# Patient Record
Sex: Female | Born: 1965 | Race: Black or African American | Hispanic: No | Marital: Single | Smoking: Former smoker
Health system: Southern US, Community
[De-identification: ages and names within clinical notes are randomized; demographics above are authoritative.]

## PROBLEM LIST (undated history)

## (undated) DIAGNOSIS — J45909 Unspecified asthma, uncomplicated: Secondary | ICD-10-CM

## (undated) DIAGNOSIS — E119 Type 2 diabetes mellitus without complications: Secondary | ICD-10-CM

## (undated) DIAGNOSIS — G4730 Sleep apnea, unspecified: Secondary | ICD-10-CM

## (undated) DIAGNOSIS — M1990 Unspecified osteoarthritis, unspecified site: Secondary | ICD-10-CM

## (undated) HISTORY — DX: Type 2 diabetes mellitus without complications: E11.9

## (undated) HISTORY — PX: ABDOMINAL HYSTERECTOMY: SHX81

---

## 1995-11-28 HISTORY — PX: HYSTERECTOMY ABDOMINAL WITH SALPINGECTOMY: SHX6725

## 1998-07-02 ENCOUNTER — Inpatient Hospital Stay (HOSPITAL_COMMUNITY): Admission: AD | Admit: 1998-07-02 | Discharge: 1998-07-02 | Payer: Self-pay | Admitting: Obstetrics and Gynecology

## 1998-11-12 ENCOUNTER — Other Ambulatory Visit: Admission: RE | Admit: 1998-11-12 | Discharge: 1998-11-12 | Payer: Self-pay | Admitting: Obstetrics and Gynecology

## 1999-05-03 ENCOUNTER — Other Ambulatory Visit: Admission: RE | Admit: 1999-05-03 | Discharge: 1999-05-03 | Payer: Self-pay | Admitting: Obstetrics and Gynecology

## 2001-03-25 ENCOUNTER — Ambulatory Visit (HOSPITAL_COMMUNITY): Admission: RE | Admit: 2001-03-25 | Discharge: 2001-03-25 | Payer: Self-pay | Admitting: Family Medicine

## 2001-03-25 ENCOUNTER — Encounter: Payer: Self-pay | Admitting: Family Medicine

## 2002-01-25 ENCOUNTER — Encounter: Payer: Self-pay | Admitting: Family Medicine

## 2002-01-25 ENCOUNTER — Ambulatory Visit (HOSPITAL_COMMUNITY): Admission: RE | Admit: 2002-01-25 | Discharge: 2002-01-25 | Payer: Self-pay | Admitting: Family Medicine

## 2002-07-14 ENCOUNTER — Ambulatory Visit (HOSPITAL_COMMUNITY): Admission: RE | Admit: 2002-07-14 | Discharge: 2002-07-14 | Payer: Self-pay | Admitting: Family Medicine

## 2002-07-14 ENCOUNTER — Encounter: Payer: Self-pay | Admitting: Family Medicine

## 2005-08-29 ENCOUNTER — Ambulatory Visit: Payer: Self-pay | Admitting: General Practice

## 2006-09-12 ENCOUNTER — Ambulatory Visit: Payer: Self-pay | Admitting: General Practice

## 2007-05-24 ENCOUNTER — Emergency Department (HOSPITAL_COMMUNITY): Admission: EM | Admit: 2007-05-24 | Discharge: 2007-05-25 | Payer: Self-pay | Admitting: Emergency Medicine

## 2007-05-28 ENCOUNTER — Encounter (HOSPITAL_COMMUNITY): Admission: RE | Admit: 2007-05-28 | Discharge: 2007-08-26 | Payer: Self-pay | Admitting: Family Medicine

## 2007-09-17 ENCOUNTER — Ambulatory Visit: Payer: Self-pay | Admitting: General Practice

## 2008-09-24 ENCOUNTER — Ambulatory Visit (HOSPITAL_COMMUNITY): Admission: RE | Admit: 2008-09-24 | Discharge: 2008-09-24 | Payer: Self-pay | Admitting: Family Medicine

## 2011-04-10 ENCOUNTER — Ambulatory Visit: Payer: Self-pay | Admitting: Emergency Medicine

## 2011-05-17 ENCOUNTER — Other Ambulatory Visit: Payer: Self-pay | Admitting: Obstetrics and Gynecology

## 2011-05-17 DIAGNOSIS — R110 Nausea: Secondary | ICD-10-CM

## 2011-05-19 ENCOUNTER — Ambulatory Visit
Admission: RE | Admit: 2011-05-19 | Discharge: 2011-05-19 | Disposition: A | Discharge status: Home / Self Care | Payer: BC Managed Care – PPO | Source: Ambulatory Visit | Attending: Obstetrics and Gynecology | Admitting: Obstetrics and Gynecology

## 2011-05-19 DIAGNOSIS — R110 Nausea: Secondary | ICD-10-CM

## 2011-05-19 MED ORDER — IOHEXOL 300 MG/ML  SOLN
100.0000 mL | Freq: Once | INTRAMUSCULAR | Status: AC | PRN
  Administered 2011-05-19: 125 mL via INTRAVENOUS

## 2011-06-13 ENCOUNTER — Other Ambulatory Visit (HOSPITAL_COMMUNITY): Payer: Self-pay | Admitting: Gastroenterology

## 2011-06-13 DIAGNOSIS — R110 Nausea: Secondary | ICD-10-CM

## 2011-06-29 ENCOUNTER — Encounter (HOSPITAL_COMMUNITY)
Admission: RE | Admit: 2011-06-29 | Discharge: 2011-06-29 | Disposition: A | Discharge status: Home / Self Care | Payer: BC Managed Care – PPO | Source: Ambulatory Visit | Attending: Gastroenterology | Admitting: Gastroenterology

## 2011-06-29 DIAGNOSIS — R110 Nausea: Secondary | ICD-10-CM | POA: Insufficient documentation

## 2011-06-29 DIAGNOSIS — R109 Unspecified abdominal pain: Secondary | ICD-10-CM | POA: Insufficient documentation

## 2011-06-29 MED ORDER — SINCALIDE 5 MCG IJ SOLR: 0.0200 ug/kg | Freq: Once | INTRAMUSCULAR | Status: DC

## 2011-06-29 MED ORDER — TECHNETIUM TC 99M MEBROFENIN IV KIT
5.2000 | PACK | Freq: Once | INTRAVENOUS | Status: AC | PRN
  Administered 2011-06-29: 5.2 via INTRAVENOUS

## 2011-07-06 ENCOUNTER — Emergency Department (HOSPITAL_COMMUNITY)
Admission: EM | Admit: 2011-07-06 | Discharge: 2011-07-06 | Disposition: A | Discharge status: Home / Self Care | Payer: BC Managed Care – PPO | Attending: Emergency Medicine | Admitting: Emergency Medicine

## 2011-07-06 DIAGNOSIS — L298 Other pruritus: Secondary | ICD-10-CM | POA: Insufficient documentation

## 2011-07-06 DIAGNOSIS — T373X5A Adverse effect of other antiprotozoal drugs, initial encounter: Secondary | ICD-10-CM | POA: Insufficient documentation

## 2011-07-06 DIAGNOSIS — R221 Localized swelling, mass and lump, neck: Secondary | ICD-10-CM | POA: Insufficient documentation

## 2011-07-06 DIAGNOSIS — L2989 Other pruritus: Secondary | ICD-10-CM | POA: Insufficient documentation

## 2011-07-06 DIAGNOSIS — R21 Rash and other nonspecific skin eruption: Secondary | ICD-10-CM | POA: Insufficient documentation

## 2011-07-06 DIAGNOSIS — L500 Allergic urticaria: Secondary | ICD-10-CM | POA: Insufficient documentation

## 2011-07-06 DIAGNOSIS — R220 Localized swelling, mass and lump, head: Secondary | ICD-10-CM | POA: Insufficient documentation

## 2012-09-18 ENCOUNTER — Ambulatory Visit: Payer: Self-pay

## 2012-09-21 ENCOUNTER — Ambulatory Visit (INDEPENDENT_AMBULATORY_CARE_PROVIDER_SITE_OTHER): Payer: BC Managed Care – PPO | Admitting: Emergency Medicine

## 2012-09-21 VITALS — BP 121/77 | HR 102 | Temp 98.8°F | Resp 16 | Ht 62.0 in | Wt 206.0 lb

## 2012-09-21 DIAGNOSIS — M5430 Sciatica, unspecified side: Secondary | ICD-10-CM

## 2012-09-21 MED ORDER — TRAMADOL HCL 50 MG PO TABS: 50.0000 mg | ORAL_TABLET | Freq: Four times a day (QID) | ORAL | Status: DC | PRN

## 2012-09-21 MED ORDER — NAPROXEN SODIUM 550 MG PO TABS: 550.0000 mg | ORAL_TABLET | Freq: Two times a day (BID) | ORAL | Status: DC

## 2012-09-21 NOTE — Progress Notes (Signed)
[facility] [address], [place] [address] [phone]  Date:  09/21/2012   Name:  Stephanie Chandler   DOB:  [birth year]   MRN:  [id number]  PCP:  No primary provider on file.    Chief Complaint: Knee Pain   History of Present Illness:  Stephanie Chandler is a 46 y.o. very pleasant female patient who presents with the following:  Year or two history of intermittent pain in knee associated with radiation of pain from knee into ankle.  Numbness in lower leg at time and "feels off balance" due to weakness in her knee.  Chronic low back pain not relieved by diclofenac.  No history of injury.  Pain is intermittent yesterday so severe she could barely walk due to pain and today is nearly gone.  Worse with ambulation.  Not as bad when she stands  There is no problem list on file for this patient.   No past medical history on file.  No past surgical history on file.  History  Substance Use Topics  . Smoking status: Never Smoker   . Smokeless tobacco: Not on file  . Alcohol Use: Not on file    No family history on file.  Allergies  Allergen Reactions  . Flagyl (Metronidazole) Hives    Medication list has been reviewed and updated.  No current outpatient prescriptions on file prior to visit.    Review of Systems:  As per HPI, otherwise negative.    Physical Examination: Filed Vitals:   09/21/12 1744  BP: 121/77  Pulse: 102  Temp: 98.8 F (37.1 C)  Resp: 16   Filed Vitals:   09/21/12 1744  Height: 5\' 2"  (1.575 m)  Weight: 206 lb (93.441 kg)   Body mass index is 37.68 kg/(m^2). Ideal Body Weight: Weight in (lb) to have BMI = 25: 136.4    GEN: obese, NAD, Non-toxic, Alert & Oriented x 3 HEENT: Atraumatic, Normocephalic.  Ears and Nose: No external deformity. Back:  Not tender  EXTR: No clubbing/cyanosis/edema NEURO: Normal gait. SLR positive on right.  Strength and neuro intact PSYCH: Normally interactive. Conversant. Not depressed or  anxious appearing.  Calm demeanor.    Assessment and Plan: Sciatic neuritis Anaprox Ultram Follow up in one month unless not improved  Carmelina Dane, MD

## 2012-10-03 NOTE — Progress Notes (Signed)
Reviewed and agree.

## 2012-11-13 ENCOUNTER — Encounter: Payer: Self-pay | Admitting: Emergency Medicine

## 2012-11-13 ENCOUNTER — Ambulatory Visit (INDEPENDENT_AMBULATORY_CARE_PROVIDER_SITE_OTHER): Payer: BC Managed Care – PPO | Admitting: Emergency Medicine

## 2012-11-13 VITALS — BP 130/80 | HR 79 | Temp 98.3°F | Resp 16 | Ht 62.0 in | Wt 206.0 lb

## 2012-11-13 DIAGNOSIS — M5430 Sciatica, unspecified side: Secondary | ICD-10-CM | POA: Insufficient documentation

## 2012-11-13 MED ORDER — NAPROXEN SODIUM 550 MG PO TABS: 550.0000 mg | ORAL_TABLET | Freq: Two times a day (BID) | ORAL | Status: DC

## 2012-11-13 MED ORDER — CYCLOBENZAPRINE HCL 10 MG PO TABS: 10.0000 mg | ORAL_TABLET | Freq: Three times a day (TID) | ORAL | Status: DC | PRN

## 2012-11-13 MED ORDER — HYDROCODONE-ACETAMINOPHEN 5-325 MG PO TABS: 1.0000 | ORAL_TABLET | ORAL | Status: DC | PRN

## 2012-11-13 NOTE — Progress Notes (Signed)
[facility] [address] [place] [address] [phone]  Date:  11/13/2012   Name:  Stephanie Chandler   DOB:  December 19, 1965   MRN:  [id number]  PCP:  No primary provider on file.    Chief Complaint: Back Pain and Knee Pain    History of Present Illness:  Stephanie Chandler is a 46 y.o. very pleasant female patient who presents with the following:  Pain in her left knee similar to the pain she experienced in her right knee in 10.26.2013.  Has no history of injury.  Walks and stands a lot at work and is working a lot of overtime now in anticipation of the holidays.  Has some back pain that is new. No pain in right knee.  Says her left knee is not swelling or red.  Some pain radiation from time to time in the calf posteriorly when the pain worsens.  There is no problem list on file for this patient.   No past medical history on file.  No past surgical history on file.  History  Substance Use Topics  . Smoking status: Never Smoker   . Smokeless tobacco: Not on file  . Alcohol Use: Not on file    No family history on file.  Allergies  Allergen Reactions  . Flagyl (Metronidazole) Hives    Medication list has been reviewed and updated.  Current Outpatient Prescriptions on File Prior to Visit  Medication Sig Dispense Refill  . traMADol (ULTRAM) 50 MG tablet Take 1-2 tablets (50-100 mg total) by mouth every 6 (six) hours as needed for pain.  50 tablet  0  . diclofenac (VOLTAREN) 75 MG EC tablet Take 75 mg by mouth as needed.      . naproxen sodium (ANAPROX DS) 550 MG tablet Take 1 tablet (550 mg total) by mouth 2 (two) times daily with a meal.  40 tablet  0    Review of Systems:  As per HPI, otherwise negative.    Physical Examination: Filed Vitals:   11/13/12 1617  BP: 130/80  Pulse: 79  Temp: 98.3 F (36.8 C)  Resp: 16   Filed Vitals:   11/13/12 1617  Height: 5\' 2"  (1.575 m)  Weight: 206 lb (93.441 kg)   Body mass index is 37.68  kg/(m^2). Ideal Body Weight: Weight in (lb) to have BMI = 25: 136.4    GEN: WDWN, NAD, Non-toxic, Alert & Oriented x 3 HEENT: Atraumatic, Normocephalic.  Ears and Nose: No external deformity. EXTR: No clubbing/cyanosis/edema NEURO: Normal gait.  PSYCH: Normally interactive. Conversant. Not depressed or anxious appearing.  Calm demeanor.  BACK:  No tenderness Left KNEE:  Full active and passive ROM.  No effusion or tenderness.  Calf soft and not tender.  No ecchymosis.  Assessment and Plan: Sciatic neuritis. Anaprox Flexeril vicodin Follow up as needed  Carmelina Dane, MD

## 2012-12-09 ENCOUNTER — Telehealth: Payer: Self-pay

## 2012-12-09 NOTE — Telephone Encounter (Signed)
She states she works at [employer]. States if she gets restrictions they can accomodate her, please advise.

## 2012-12-09 NOTE — Telephone Encounter (Signed)
PT STATES SHE CAN'T AFFORD TO COME BUT WOULD LIKE DR Dareen Piano TO WRITE A NOTE STATING SHE CANNOT CLIMB STAIRS OR LADDERS DUE TO HER LEG PAIN AND ALSO CANNOT WALK 2 LENGTHS OF A FOOTBALL FIELD. WILL WE BE ABLE TO LET THE NOTE START TOMORROW AND GOOD FOR 1 MONTH, THAT WAY SHE CAN BE ABLE TO COME BACK IN WITHIN THAT TIME FRAME. PLEASE CALL [phone]

## 2012-12-09 NOTE — Telephone Encounter (Signed)
Need more info, what does she need note for?

## 2012-12-11 ENCOUNTER — Telehealth: Payer: Self-pay

## 2012-12-11 NOTE — Telephone Encounter (Signed)
PT HAS BEEN SEEING DR Dareen Piano, SHE HAD COMPLETED THE ANTI-INFLAMMATORY MEDICATION, ON FRIDAY

## 2012-12-11 NOTE — Telephone Encounter (Signed)
PT CALLED STATES HAS FINISHED ANTI-INFLAMMATORY MEDS, BUT ON 12/06/12 SHE STARTED HAVING STIFFNESS AND SWELLING IN HER RIGHT LEG.  WANTS TO KNOW SHOULD SHE GET AN MRI AS PREVIOUSLY DISCUSSED WITH DR Dareen Piano, OR WHAT ELSE SHOULD SHE DO. PLEASE ADVISE PT

## 2012-12-13 MED ORDER — NAPROXEN SODIUM 550 MG PO TABS: 550.0000 mg | ORAL_TABLET | Freq: Two times a day (BID) | ORAL | Status: AC

## 2012-12-16 NOTE — Telephone Encounter (Signed)
Dr Dareen Piano,  I see that you RFd pt's Naproxen. Do you want me to advise pt anything in regards to her question concerning MRI or add'l f/up? Also, do you want to write a letter for pt's employer as requested?

## 2013-08-27 ENCOUNTER — Other Ambulatory Visit (HOSPITAL_COMMUNITY): Payer: Self-pay | Admitting: Family Medicine

## 2013-08-27 DIAGNOSIS — Z1231 Encounter for screening mammogram for malignant neoplasm of breast: Secondary | ICD-10-CM

## 2013-09-02 ENCOUNTER — Ambulatory Visit (HOSPITAL_COMMUNITY): Payer: BC Managed Care – PPO

## 2014-11-13 ENCOUNTER — Other Ambulatory Visit: Payer: Self-pay | Admitting: Gastroenterology

## 2014-11-13 DIAGNOSIS — R110 Nausea: Secondary | ICD-10-CM

## 2014-12-07 ENCOUNTER — Encounter (HOSPITAL_COMMUNITY): Payer: BC Managed Care – PPO

## 2014-12-07 ENCOUNTER — Ambulatory Visit (HOSPITAL_COMMUNITY): Payer: BC Managed Care – PPO

## 2015-07-02 ENCOUNTER — Institutional Professional Consult (permissible substitution): Payer: Self-pay | Admitting: Internal Medicine

## 2015-08-23 ENCOUNTER — Ambulatory Visit (INDEPENDENT_AMBULATORY_CARE_PROVIDER_SITE_OTHER): Payer: No Typology Code available for payment source | Admitting: Pulmonary Disease

## 2015-08-23 ENCOUNTER — Encounter: Payer: Self-pay | Admitting: Pulmonary Disease

## 2015-08-23 VITALS — BP 120/80 | HR 84 | Ht 61.0 in | Wt 203.6 lb

## 2015-08-23 DIAGNOSIS — G4733 Obstructive sleep apnea (adult) (pediatric): Secondary | ICD-10-CM | POA: Diagnosis not present

## 2015-08-23 NOTE — Assessment & Plan Note (Signed)
Given excessive daytime somnolence, narrow pharyngeal exam, witnessed apneas & loud snoring, obstructive sleep apnea is very likely & an overnight polysomnogram will be scheduled as a home study. The pathophysiology of obstructive sleep apnea , it's cardiovascular consequences & modes of treatment including CPAP were discused with the patient in detail & they evidenced understanding.  

## 2015-08-23 NOTE — Patient Instructions (Signed)
Home sleep study You may have sleep apnea

## 2015-08-23 NOTE — Progress Notes (Signed)
   Subjective:    Patient ID: Stephanie Chandler, female    DOB: October 04, 1966, 49 y.o.   MRN: [id number]  HPI  49 year old presents for evaluation of sleep-disordered breathing. She works in [employer] [place] at [employer]  She reports a couple of occasions when she has been in such deep sleep and difficult to arouse. She missed picking up her 50 year old and did not wake up when they were banging on the door. She has missed her niece coming in to the house since she was in a deep sleep. She reports increased nocturnal awakenings. Loud snoring have been noted by multiple family members. There is no bed partner history available to comment and witnessed apneas Epworth sleepiness score is 7 Bedtime is around 10 PM, sleep latency about an hour, the TV stays on through the night, she sleeps on her back with 2-4 pillows, reports 2-3 nocturnal awakenings including nocturia and is out of bed by 6:30 AM feeling tired with occasional dryness of mouth. She's gained 30 pounds in the last few years. She does report increased intake of caffeinated beverages to keep herself awake in the daytime She quit smoking a month ago Weight loss encouraged, compliance with goal of at least 4-6 hrs every night is the expectation. Advised against medications with sedative side effects Cautioned against driving when sleepy - understanding that sleepiness will vary on a day to day basis   No past medical history on file.  No past surgical history on file.  Allergies  Allergen Reactions  . Flagyl [Metronidazole] Hives    Social History   Social History  . Marital Status: Married    Spouse Name: N/A  . Number of Children: N/A  . Years of Education: N/A   Occupational History  . Not on file.   Social History Main Topics  . Smoking status: Never Smoker   . Smokeless tobacco: Not on file  . Alcohol Use: Not on file  . Drug Use: Not on file  . Sexual Activity: Not on file   Other Topics Concern  . Not  on file   Social History Narrative    No family history on file.   Review of Systems  Constitutional: Negative for fever, chills and unexpected weight change.  HENT: Negative for congestion, dental problem, ear pain, nosebleeds, postnasal drip, rhinorrhea, sinus pressure, sneezing, sore throat, trouble swallowing and voice change.   Eyes: Negative for visual disturbance.  Respiratory: Negative for cough, choking and shortness of breath.   Cardiovascular: Negative for chest pain and leg swelling.  Gastrointestinal: Negative for vomiting, abdominal pain and diarrhea.  Genitourinary: Negative for difficulty urinating.  Musculoskeletal: Negative for arthralgias.  Skin: Negative for rash.  Neurological: Negative for tremors, syncope and headaches.  Hematological: Does not bruise/bleed easily.       Objective:   Physical Exam  Gen. Pleasant, obese, in no distress, normal affect ENT - no lesions, no post nasal drip, class 2-3 airway Neck: No JVD, no thyromegaly, no carotid bruits Lungs: no use of accessory muscles, no dullness to percussion, decreased without rales or rhonchi  Cardiovascular: Rhythm regular, heart sounds  normal, no murmurs or gallops, no peripheral edema Abdomen: soft and non-tender, no hepatosplenomegaly, BS normal. Musculoskeletal: No deformities, no cyanosis or clubbing Neuro:  alert, non focal, no tremors       Assessment & Plan:

## 2015-09-07 DIAGNOSIS — G4733 Obstructive sleep apnea (adult) (pediatric): Secondary | ICD-10-CM | POA: Diagnosis not present

## 2015-09-15 ENCOUNTER — Telehealth: Payer: Self-pay | Admitting: Pulmonary Disease

## 2015-09-15 DIAGNOSIS — G4733 Obstructive sleep apnea (adult) (pediatric): Secondary | ICD-10-CM | POA: Diagnosis not present

## 2015-09-15 NOTE — Telephone Encounter (Signed)
ATC pt x 3. Line busy. WCB.  

## 2015-09-15 NOTE — Telephone Encounter (Addendum)
Sleep Study results - 09/07/15 Per Dr. Vassie LollAlva: Mild Sleep Apnea AHI: 9/hr OV to discuss

## 2015-09-16 ENCOUNTER — Other Ambulatory Visit: Payer: Self-pay | Admitting: *Deleted

## 2015-09-16 DIAGNOSIS — G4733 Obstructive sleep apnea (adult) (pediatric): Secondary | ICD-10-CM

## 2015-09-16 NOTE — Telephone Encounter (Signed)
ATC line busy wcb 

## 2015-09-20 NOTE — Telephone Encounter (Signed)
lmtcb

## 2015-09-21 NOTE — Telephone Encounter (Signed)
lmtcb

## 2015-09-22 NOTE — Telephone Encounter (Signed)
lmomtcb x4 for pt

## 2015-09-23 ENCOUNTER — Telehealth: Payer: Self-pay | Admitting: Pulmonary Disease

## 2015-09-23 NOTE — Telephone Encounter (Addendum)
Left message to call back.  Unable to reach letter mailed to patient.

## 2015-09-23 NOTE — Telephone Encounter (Signed)
Stephanie HampshireMichelle P Chandler, CMA at 09/15/2015 12:25 PM     Status: Addendum       Expand All Collapse All   Sleep Study results - 09/07/15 Per Dr. Vassie LollAlva: Mild Sleep Apnea AHI: 9/hr OV to discuss      ---   lmtcb x1

## 2015-09-24 NOTE — Telephone Encounter (Signed)
lmomtcb x 2  

## 2015-09-27 NOTE — Telephone Encounter (Signed)
lmtcb x3 for pt. 

## 2015-10-05 ENCOUNTER — Telehealth: Payer: Self-pay | Admitting: Pulmonary Disease

## 2015-10-05 NOTE — Telephone Encounter (Signed)
See TE dated 10/05/15 Closing encounter

## 2015-10-05 NOTE — Telephone Encounter (Signed)
Patient called and left message saying for me to leave her Sleep Study details on her voicemail. Called patient and advised her of her sleep study results and advised her to contact our office to schedule a follow up appointment. Nothing further needed. Closing encounter

## 2016-05-31 ENCOUNTER — Other Ambulatory Visit: Payer: Self-pay | Admitting: Family Medicine

## 2016-06-01 LAB — CMP12+LP+TP+TSH+6AC+CBC/D/PLT
ALT: 22 IU/L (ref 0–32)
AST: 18 IU/L (ref 0–40)
Albumin/Globulin Ratio: 1.6 (ref 1.2–2.2)
Albumin: 4.1 g/dL (ref 3.5–5.5)
Alkaline Phosphatase: 67 IU/L (ref 39–117)
BUN/Creatinine Ratio: 21 (ref 9–23)
BUN: 15 mg/dL (ref 6–24)
Basophils Absolute: 0 10*3/uL (ref 0.0–0.2)
Basos: 1 %
Bilirubin Total: 0.2 mg/dL (ref 0.0–1.2)
Calcium: 9 mg/dL (ref 8.7–10.2)
Chloride: 101 mmol/L (ref 96–106)
Chol/HDL Ratio: 3.7 ratio units (ref 0.0–4.4)
Cholesterol, Total: 164 mg/dL (ref 100–199)
Creatinine, Ser: 0.71 mg/dL (ref 0.57–1.00)
EOS (ABSOLUTE): 0.2 10*3/uL (ref 0.0–0.4)
Eos: 2 %
Estimated CHD Risk: 0.7 times avg. (ref 0.0–1.0)
Free Thyroxine Index: 1.7 (ref 1.2–4.9)
GFR calc Af Amer: 114 mL/min/{1.73_m2} (ref >59)
GFR calc non Af Amer: 99 mL/min/{1.73_m2} (ref >59)
GGT: 35 IU/L (ref 0–60)
Globulin, Total: 2.5 g/dL (ref 1.5–4.5)
Glucose: 110 mg/dL — ABNORMAL HIGH (ref 65–99)
HDL: 44 mg/dL (ref >39)
Hematocrit: 37.5 % (ref 34.0–46.6)
Hemoglobin: 12.6 g/dL (ref 11.1–15.9)
Immature Grans (Abs): 0 10*3/uL (ref 0.0–0.1)
Immature Granulocytes: 0 %
Iron: 87 ug/dL (ref 27–159)
LDH: 167 IU/L (ref 119–226)
LDL Calculated: 106 mg/dL — ABNORMAL HIGH (ref 0–99)
Lymphocytes Absolute: 2 10*3/uL (ref 0.7–3.1)
Lymphs: 31 %
MCH: 29.6 pg (ref 26.6–33.0)
MCHC: 33.6 g/dL (ref 31.5–35.7)
MCV: 88 fL (ref 79–97)
Monocytes Absolute: 0.7 10*3/uL (ref 0.1–0.9)
Monocytes: 10 %
Neutrophils Absolute: 3.7 10*3/uL (ref 1.4–7.0)
Neutrophils: 56 %
Phosphorus: 3.1 mg/dL (ref 2.5–4.5)
Platelets: 283 10*3/uL (ref 150–379)
Potassium: 4.1 mmol/L (ref 3.5–5.2)
RBC: 4.25 x10E6/uL (ref 3.77–5.28)
RDW: 13.9 % (ref 12.3–15.4)
Sodium: 139 mmol/L (ref 134–144)
T3 Uptake Ratio: 27 % (ref 24–39)
T4, Total: 6.4 ug/dL (ref 4.5–12.0)
TSH: 1.39 u[IU]/mL (ref 0.450–4.500)
Total Protein: 6.6 g/dL (ref 6.0–8.5)
Triglycerides: 69 mg/dL (ref 0–149)
Uric Acid: 3.4 mg/dL (ref 2.5–7.1)
VLDL Cholesterol Cal: 14 mg/dL (ref 5–40)
WBC: 6.6 10*3/uL (ref 3.4–10.8)

## 2016-06-01 LAB — HGB A1C W/O EAG: Hgb A1c MFr Bld: 5.8 % — ABNORMAL HIGH (ref 4.8–5.6)

## 2016-11-27 HISTORY — PX: CATARACT EXTRACTION: SUR2

## 2016-12-22 ENCOUNTER — Ambulatory Visit: Payer: Self-pay | Admitting: *Deleted

## 2016-12-22 VITALS — BP 128/82

## 2016-12-22 DIAGNOSIS — R200 Anesthesia of skin: Secondary | ICD-10-CM

## 2016-12-22 DIAGNOSIS — M25511 Pain in right shoulder: Secondary | ICD-10-CM

## 2016-12-22 DIAGNOSIS — M25512 Pain in left shoulder: Principal | ICD-10-CM

## 2016-12-22 NOTE — Progress Notes (Signed)
Pt c/o upper back and bilateral shoulder blade pain and central low back pain. Pain has been occurring intermittently and worsening for months. Pain in R shoulder > L. But L arm goes numb a few times a day now. Numbness relieved when she raises arm above head. Stroke screen neg. Denies weakness or pain when these episodes occur. No Hx of known back injuries. 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 Also c/o pain to bilateral hips, worse after walking more than short distances. Has used Tylenol, Ibuprofen, ice and heat with relief but pain always returns. Looking for suggestions on plan of care. Does not have PCP or ortho specialist. Saw someone 4-5 years ago that did cortisone inj in knee, but unsure of name and does not want to return due to adverse effects of cortisone inj. Suggested establishing primary care to start, then they could refer if needed to specialist. Pt in agreement with this plan of care. Provided pt with a list of options of local PCPs accepting new pts. Will /u with RN as needed.

## 2016-12-27 ENCOUNTER — Encounter: Payer: Self-pay | Admitting: Physician Assistant

## 2016-12-27 ENCOUNTER — Ambulatory Visit (INDEPENDENT_AMBULATORY_CARE_PROVIDER_SITE_OTHER): Payer: No Typology Code available for payment source

## 2016-12-27 ENCOUNTER — Ambulatory Visit (INDEPENDENT_AMBULATORY_CARE_PROVIDER_SITE_OTHER): Payer: No Typology Code available for payment source | Admitting: Physician Assistant

## 2016-12-27 VITALS — BP 138/82 | HR 92 | Temp 98.6°F | Ht 61.0 in | Wt 204.8 lb

## 2016-12-27 DIAGNOSIS — G8929 Other chronic pain: Secondary | ICD-10-CM | POA: Diagnosis not present

## 2016-12-27 DIAGNOSIS — R7303 Prediabetes: Secondary | ICD-10-CM | POA: Diagnosis not present

## 2016-12-27 DIAGNOSIS — M5010 Cervical disc disorder with radiculopathy, unspecified cervical region: Secondary | ICD-10-CM | POA: Diagnosis not present

## 2016-12-27 DIAGNOSIS — M545 Low back pain, unspecified: Secondary | ICD-10-CM

## 2016-12-27 LAB — POCT GLYCOSYLATED HEMOGLOBIN (HGB A1C): Hemoglobin A1C: 6.4

## 2016-12-27 MED ORDER — MELOXICAM 7.5 MG PO TABS: 7.5000 mg | ORAL_TABLET | Freq: Every day | ORAL | 0 refills | Status: DC

## 2016-12-27 MED ORDER — PREDNISONE 20 MG PO TABS: ORAL_TABLET | ORAL | 0 refills | Status: AC

## 2016-12-27 MED ORDER — CYCLOBENZAPRINE HCL 5 MG PO TABS: 2.5000 mg | ORAL_TABLET | Freq: Three times a day (TID) | ORAL | 1 refills | Status: DC | PRN

## 2016-12-27 NOTE — Progress Notes (Signed)
12/27/2016 6:52 PM   DOB: 07-02-1966 / MRN: [id number]  SUBJECTIVE:  Stephanie Chandler is a 51 y.o. female presenting for left shoulder pain that is relieved with raising her arm above her head.  This started 2 weeks ago. It is not getting worse or better.  She does have some neck pain as she points to her right trapezius.  She has tried Ibuprofen and a muscle relaxer, both of which help but do not remiss the symptoms. She denies changes in strength and dexterity. She reports a history of prediabetes.   She complains of bilateral hip pain that occurs when she walks.  The pain does not radiate.  Her back does hurt about the sacrum and this has been present "for years." The hip pain also started about a week ago.  She denies incontinence.   She denies any abnormal stressors at this time.   Depression screen PHQ 2/9 12/27/2016  Decreased Interest 0  Down, Depressed, Hopeless 0  PHQ - 2 Score 0     She is allergic to flagyl [metronidazole].   She  has no past medical history on file.    She  reports that she has been smoking Cigarettes.  She has a 0.06 pack-year smoking history. She has never used smokeless tobacco. She reports that she drinks about 0.6 oz of alcohol per week . She reports that she does not use drugs. She  reports that she does not engage in sexual activity. The patient  has a past surgical history that includes Hysterectomy abdominal with salpingectomy (1997).  Her family history includes Arthritis in her mother; Diabetes in her father, mother, and paternal grandmother; Heart disease in her maternal grandmother; Hypertension in her mother; Kidney disease in her father.  Review of Systems  Constitutional: Negative for fever.  Gastrointestinal: Negative for abdominal pain.  Musculoskeletal: Positive for back pain, myalgias and neck pain. Negative for falls.  Skin: Negative for itching and rash.  Neurological: Positive for sensory change. Negative for dizziness, tingling, tremors,  speech change, focal weakness and headaches.  Endo/Heme/Allergies: Negative for polydipsia.    The problem list and medications were reviewed and updated by myself where necessary and exist elsewhere in the encounter.   OBJECTIVE:  BP 138/82   Pulse 92   Temp 98.6 F (37 C) (Oral)   Ht 5\' 1"  (1.549 m)   Wt 204 lb 12.8 oz (92.9 kg)   SpO2 96%   BMI 38.70 kg/m   Physical Exam  Constitutional: She is oriented to person, place, and time. She appears well-developed and well-nourished. No distress.  Cardiovascular: Normal rate and regular rhythm.   Pulmonary/Chest: Effort normal.  Musculoskeletal: Normal range of motion. She exhibits tenderness (right trapesius, paraspinals about the lumbar vertebre). She exhibits no edema or deformity.  Neurological: She is alert and oriented to person, place, and time. She displays normal reflexes. No cranial nerve deficit. She exhibits normal muscle tone. Coordination normal.  Skin: Skin is warm and dry. She is not diaphoretic.  Psychiatric: She has a normal mood and affect.    Wt Readings from Last 3 Encounters:  12/27/16 204 lb 12.8 oz (92.9 kg)  08/23/15 203 lb 9.6 oz (92.4 kg)  11/13/12 206 lb (93.4 kg)    Results for orders placed or performed in visit on 12/27/16 (from the past 72 hour(s))  POCT glycosylated hemoglobin (Hb A1C)     Status: None   Collection Time: 12/27/16  6:27 PM  Result Value Ref Range  Hemoglobin A1C 6.4    ASSESSMENT AND PLAN:  Lynden AngCathy was seen today for arm pain and back pain.  Diagnoses and all orders for this visit:  Cervical disc disorder with radiculopathy of cervical region -     DG Cervical Spine 2 or 3 views; Future -     Basic metabolic panel -     POCT glycosylated hemoglobin (Hb A1C) -     predniSONE (DELTASONE) 20 MG tablet; Take 3 in the morning for 3 days, then 2 in the morning for 3 days, and then 1 in the morning for 3 days. -     cyclobenzaprine (FLEXERIL) 5 MG tablet; Take 0.5-1 tablets  (2.5-5 mg total) by mouth 3 (three) times daily as needed for muscle spasms.  Chronic bilateral low back pain without sciatica -     DG Lumbar Spine 2-3 Views; Future -     meloxicam (MOBIC) 7.5 MG tablet; Take 1-2 tablets (7.5-15 mg total) by mouth daily. Do not take with prednisone, ibuprofen, aleve or aspirin.  Prediabetes: RTC in three months for recheck.  Advised cutting out sugar.     The patient is advised to call or return to clinic if she does not see an improvement in symptoms, or to seek the care of the closest emergency department if she worsens with the above plan.   Deliah BostonMichael Clark, MHS, PA-C [facility] 12/27/2016 6:52 PM

## 2016-12-27 NOTE — Patient Instructions (Addendum)
Come back in 10 days if you are not improving.  It is okay to start meloxicam after prednisone if you need it.     IF you received an x-ray today, you will receive an invoice from [facility]. Please contact [facility] at [phone] with questions or concerns regarding your invoice.   IF you received labwork today, you will receive an invoice from [facility]. Please contact [facility] at [phone] with questions or concerns regarding your invoice.   Our billing staff will not be able to assist you with questions regarding bills from these companies.  You will be contacted with the lab results as soon as they are available. The fastest way to get your results is to activate your My Chart account. Instructions are located on the last page of this paperwork. If you have not heard from us regarding the results in 2 weeks, please contact this office.

## 2016-12-28 LAB — BASIC METABOLIC PANEL
BUN/Creatinine Ratio: 17 (ref 9–23)
BUN: 13 mg/dL (ref 6–24)
CO2: 23 mmol/L (ref 18–29)
Calcium: 9.5 mg/dL (ref 8.7–10.2)
Chloride: 103 mmol/L (ref 96–106)
Creatinine, Ser: 0.78 mg/dL (ref 0.57–1.00)
GFR calc Af Amer: 102 mL/min/{1.73_m2} (ref >59)
GFR calc non Af Amer: 88 mL/min/{1.73_m2} (ref >59)
Glucose: 100 mg/dL — ABNORMAL HIGH (ref 65–99)
Potassium: 4.1 mmol/L (ref 3.5–5.2)
Sodium: 142 mmol/L (ref 134–144)

## 2017-01-02 ENCOUNTER — Ambulatory Visit: Payer: Self-pay | Admitting: Primary Care

## 2017-01-23 ENCOUNTER — Ambulatory Visit: Payer: Self-pay | Admitting: Registered Nurse

## 2017-01-23 VITALS — BP 128/70

## 2017-01-23 DIAGNOSIS — M5440 Lumbago with sciatica, unspecified side: Principal | ICD-10-CM

## 2017-01-23 DIAGNOSIS — G8929 Other chronic pain: Secondary | ICD-10-CM

## 2017-01-23 NOTE — Progress Notes (Signed)
Subjective:    Patient ID: Stephanie Chandler, female    DOB: 1966-11-12, 51 y.o.   MRN: [id number]  African american female seen last month for back pain/sciatica trial of diclofenac 75mg  po BID #60 RF0 patient reported worked well for her wants refill.  Denied loss of bowel/bladder control, saddle paresthesias, arm/leg weakness/falls.      Review of Systems  Constitutional: Negative for chills and fever.  HENT: Negative for congestion, ear pain and sore throat.   Eyes: Negative for pain and discharge.  Respiratory: Negative for cough and wheezing.   Cardiovascular: Negative for chest pain and leg swelling.  Gastrointestinal: Negative for blood in stool, constipation, diarrhea, nausea and vomiting.  Genitourinary: Negative for difficulty urinating, dysuria and hematuria.  Musculoskeletal: Positive for back pain and myalgias. Negative for arthralgias, gait problem, joint swelling, neck pain and neck stiffness.  Skin: Negative for rash.  Allergic/Immunologic: Negative for environmental allergies and food allergies.  Neurological: Negative for headaches.  Hematological: Negative for adenopathy. Does not bruise/bleed easily.  Psychiatric/Behavioral: Negative for agitation, confusion and sleep disturbance. The patient is not nervous/anxious.        Objective:   Physical Exam  Constitutional: She is oriented to person, place, and time. Vital signs are normal. She appears well-developed and well-nourished. She is cooperative.  Non-toxic appearance. She does not have a sickly appearance. She does not appear ill. No distress.  HENT:  Head: Normocephalic and atraumatic.  Right Ear: External ear normal.  Left Ear: External ear normal.  Nose: Nose normal.  Mouth/Throat: Oropharynx is clear and moist. No oropharyngeal exudate.  Eyes: Conjunctivae, EOM and lids are normal. Pupils are equal, round, and reactive to light. Right eye exhibits no discharge. Left eye exhibits no discharge. No scleral  icterus.  Neck: Trachea normal and normal range of motion. Neck supple. No tracheal deviation present. No thyromegaly present.  Cardiovascular: Normal rate, regular rhythm, normal heart sounds and intact distal pulses.   Pulmonary/Chest: Effort normal and breath sounds normal. No stridor. No respiratory distress. She has no wheezes. She has no rales. She exhibits no tenderness.  Abdominal: Soft. There is no guarding.  Musculoskeletal: Normal range of motion. She exhibits no deformity.       Right shoulder: Normal.       Left shoulder: Normal.       Right hip: Normal.       Left hip: Normal.       Right knee: Normal.       Left knee: Normal.       Cervical back: Normal.       Thoracic back: Normal.       Lumbar back: She exhibits pain.       Right hand: Normal.       Left hand: Normal.  Lymphadenopathy:    She has no cervical adenopathy.  Neurological: She is alert and oriented to person, place, and time. She displays no atrophy, no tremor and normal reflexes. No cranial nerve deficit or sensory deficit. She exhibits normal muscle tone. She displays no seizure activity. Coordination and gait normal. GCS eye subscore is 4. GCS verbal subscore is 5. GCS motor subscore is 6.  Hand grasp equal bilaterally 5/5 in/out chair without difficulty gait smooth and steady in hallway  Skin: Skin is warm, dry and intact. No rash noted. She is not diaphoretic. No erythema.  Psychiatric: She has a normal mood and affect. Her speech is normal and behavior is normal. Judgment and thought  content normal. Cognition and memory are normal.  Nursing note and vitals reviewed.         Assessment & Plan:  A-lumbago P- refilled diclofenac 75mg  po BID #60 RF0.  Avoid advil/aleve/naproxen/motrin/ibuprofen while on diclofenac.  May take tylenol 1000mg  po QID prn also.  Biofreeze topical prn QID.  Ice/heat  For acute pain, rest, and intermittent application of heat (do not sleep on heating pad).  I discussed  longer-term treatment plan of PRN PO NSAIDS and I discussed a home back care exercise program with a strengthening and flexibility exercise. Proper avoidance of heavy lifting discussed.  Consider physical therapy or chiropractic care and radiology if not improving.  Call or return to clinic as needed if these symptoms worsen or fail to improve as anticipated especially leg weakness, loss of bowel/bladder control or saddle paresthesias.   Patient verbalized understanding of instructions/information and agreed with plan of care.  P2:  Injury Prevention, fitness

## 2017-01-23 NOTE — Progress Notes (Signed)
Pt missed scheduled appt with NP. Walked in requesting medication refill of Diclofenac. Has refill available at pharmacy from PCP. But requesting it from clinic for cost reasons. Refill request given to NP.

## 2017-02-06 ENCOUNTER — Ambulatory Visit: Payer: Self-pay | Admitting: Registered Nurse

## 2017-02-06 DIAGNOSIS — M7521 Bicipital tendinitis, right shoulder: Secondary | ICD-10-CM

## 2017-02-06 DIAGNOSIS — M25511 Pain in right shoulder: Secondary | ICD-10-CM

## 2017-02-06 NOTE — Patient Instructions (Addendum)
Shoulder Range of Motion Exercises Shoulder range of motion (ROM) exercises are designed to keep the shoulder moving freely. They are often recommended for people who have shoulder pain. Phase 1 exercises When you are able, do this exercise 5-6 days per week, or as told by your health care provider. Work toward doing 2 sets of 10 swings. Pendulum Exercise  How To Do This Exercise Lying Down 1. Lie face-down on a bed with your abdomen close to the side of the bed. 2. Let your arm hang over the side of the bed. 3. Relax your shoulder, arm, and hand. 4. Slowly and gently swing your arm forward and back. Do not use your neck muscles to swing your arm. They should be relaxed. If you are struggling to swing your arm, have someone gently swing it for you. When you do this exercise for the first time, swing your arm at a 15 degree angle for 15 seconds, or swing your arm 10 times. As pain lessens over time, increase the angle of the swing to 30-45 degrees. 5. Repeat steps 1-4 with the other arm. How To Do This Exercise While Standing 1. Stand next to a sturdy chair or table and hold on to it with your hand. 1. Bend forward at the waist. 2. Bend your knees slightly. 3. Relax your other arm and let it hang limp. 4. Relax the shoulder blade of the arm that is hanging and let it drop. 5. While keeping your shoulder relaxed, use body motion to swing your arm in small circles. The first time you do this exercise, swing your arm for about 30 seconds or 10 times. When you do it next time, swing your arm for a little longer. 6. Stand up tall and relax. 7. Repeat steps 1-7, this time changing the direction of the circles. 2. Repeat steps 1-8 with the other arm. Phase 2 exercises Do these exercises 3-4 times per day on 5-6 days per week or as told by your health care provider. Work toward holding the stretch for 20 seconds. Stretching Exercise 1  1. Lift your arm straight out in front of you. 2. Bend your arm  90 degrees at the elbow (right angle) so your forearm goes across your body and looks like the letter "L." 3. Use your other arm to gently pull the elbow forward and across your body. 4. Repeat steps 1-3 with the other arm. Stretching Exercise 2  You will need a towel or rope for this exercise. 1. Bend one arm behind your back with the palm facing outward. 2. Hold a towel with your other hand. 3. Reach the arm that holds the towel above your head, and bend that arm at the elbow. Your wrist should be behind your neck. 4. Use your free hand to grab the free end of the towel. 5. With the higher hand, gently pull the towel up behind you. 6. With the lower hand, pull the towel down behind you. 7. Repeat steps 1-6 with the other arm. Phase 3 exercises Do each of these exercises at four different times of day (sessions) every day or as told by your health care provider. To begin with, repeat each exercise 5 times (repetitions). Work toward doing 3 sets of 12 repetitions or as told by your health care provider. Strengthening Exercise 1  You will need a light weight for this activity. As you grow stronger, you may use a heavier weight. 1. Standing with a weight in your hand, lift   your arm straight out to the side until it is at the same height as your shoulder. 2. Bend your arm at 90 degrees so that your fingers are pointing to the ceiling. 3. Slowly raise your hand until your arm is straight up in the air. 4. Repeat steps 1-3 with the other arm. Strengthening Exercise 2  You will need a light weight for this activity. As you grow stronger, you may use a heavier weight. 1. Standing with a weight in your hand, gradually move your straight arm in an arc, starting at your side, then out in front of you, then straight up over your head. 2. Gradually move your other arm in an arc, starting at your side, then out in front of you, then straight up over your head. 3. Repeat steps 1-2 with the other  arm. Strengthening Exercise 3  You will need an elastic band for this activity. As you grow stronger, gradually increase the size of the bands or increase the number of bands that you use at one time. 1. While standing, hold an elastic band in one hand and raise that arm up in the air. 2. With your other hand, pull down the band until that hand is by your side. 3. Repeat steps 1-2 with the other arm. This information is not intended to replace advice given to you by your health care provider. Make sure you discuss any questions you have with your health care provider. Document Released: 08/12/2003 Document Revised: 07/09/2016 Document Reviewed: 11/09/2014 Elsevier Interactive Patient Education  2017 [employer]. [employer]    Ideal work height is no more than 2 to 4 inches below elbow level when standing, and at elbow level when sitting. Reaching should be limited to arm's length, with elbows slightly bent.   Copyright  VHI. All rights reserved.   Biceps Tendon Tendinitis (Proximal) and Tenosynovitis Rehab Ask your health care provider which exercises are safe for you. Do exercises exactly as told by your health care provider and adjust them as directed. It is normal to feel mild stretching, pulling, tightness, or discomfort as you do these exercises, but you should stop right away if you feel sudden pain or your pain gets worse.Do not begin these exercises until told by your health care provider. Stretching and range of motion exercises These exercises warm up your muscles and joints and improve the movement and flexibility of your arm and shoulder. These exercises also help to relieve pain and stiffness. Exercise A: Shoulder flexion   1. Stand facing a wall. Put your left / right hand on the wall. 2. Slide your left / right hand up the wall. Stop when you feel a stretch in your shoulder, or when you reach the angle that is recommended by your health care provider.  Use  your other hand to help raise your arm, if needed.  As your hand gets higher, you may need to step closer to the wall.  Avoid shrugging your shoulder while you raise your arm. To do this, keep your shoulder blade tucked down toward your spine. 3. Hold for __________ seconds. 4. Slowly return to the starting position. Use your other arm to help, if needed. Repeat __________ times. Complete this exercise __________ times a day. Exercise B: Posterior capsule stretch (  passive horizontal adduction) 1. Sit or stand and pull your left / right elbow across your chest, toward your other shoulder. Stop when you feel a gentle stretch in the back of  your shoulder and upper arm.  Keep your arm at shoulder height.  Keep your arm as close to your body as you comfortably can. 2. Hold for __________ seconds. 3. Slowly return to the starting position. Repeat __________ times. Complete this exercise __________ times a day. Strengthening exercises These exercises build strength and endurance in your arm and shoulder. Endurance is the ability to use your muscles for a long time, even after your muscles get tired. Exercise C: Elbow flexion, supinated  5. Sit on a stable chair without armrests, or stand. 6. If directed, hold a __________ weight in your left / right hand, or hold an exercise band with both hands. Your palms should face up toward the ceiling at the starting position. 7. Bend your left / right elbow and move your hand up toward your shoulder. Keep your other arm straight down, in the starting position. 8. Slowly return to the starting position. Repeat __________ times. Complete this exercise __________ times a day. Exercise D: Scapular protraction, supine  8. Lie on your back on a firm surface. If directed, hold a __________ weight in your left / right hand. 9. Raise your left / right arm straight into the air so your hand is directly above your shoulder joint. 10. Push the weight into the  air so your shoulder lifts off of the surface that you are lying on. Do not move your head, neck, or back. 11. Hold for __________ seconds. 12. Slowly return to the starting position. Let your muscles relax completely before you repeat this exercise. Repeat __________ times. Complete this exercise __________ times a day. Exercise E: Scapular retraction  5. Sit in a stable chair without armrests, or stand. 6. Secure an exercise band to a stable object in front of you so the band is at shoulder height. 7. Hold one end of the exercise band in each hand. 8. Squeeze your shoulder blades together and move your elbows slightly behind you. Do not shrug your shoulders. 9. Hold for __________ seconds. 10. Slowly return to the starting position. Repeat __________ times. Complete this exercise __________ times a day. This information is not intended to replace advice given to you by your health care provider. Make sure you discuss any questions you have with your health care provider. Document Released: 11/13/2005 Document Revised: 07/20/2016 Document Reviewed: 10/22/2015 Elsevier Interactive Patient Education  2017 [employer]

## 2017-02-06 NOTE — Progress Notes (Addendum)
Subjective:    Patient ID: Stephanie Chandler, female    DOB: 04/02/1966, 51 y.o.   MRN: [id number]  50y/o african [place] female here for right shoulder pain with lifting and morning stiffness  Pt with  c/o R shoulder pain x1 month. Seen for similar pain by PCP at end of January in opposite shoulder. In this instance though, her pain is unrelieved by raising arm above shoulder. Sts she cannot do this due to limited ROM due to pain. Pain sometimes extends down back of R arm. Does not extend to back or neck. No numbnesss/tingling. Describes as sharp pain when it starts, then turns to achy. Pain comes and goes, but will wake her from sleep. Has tried Ibuprofen, Biofreeze, and muscle relaxer (Flexeril). Flexeril and Prednisone interventions did solve the left shoulder pain last month. R shoulder pain was not occurring then.  C-spine x-ray done in January with PCP. He reported arthritis to her then. No acute changes.  Has run out of flexeril needs refill taking 1/2 to 1 tab bedtime orally.  Has not been taking diclofenac on regular basis.  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  HENT: Negative for congestion, ear pain and sore throat.   Eyes: Negative for pain and discharge.  Respiratory: Negative for cough and wheezing.   Cardiovascular: Negative for chest pain and leg swelling.  Gastrointestinal: Negative for blood in stool, constipation, diarrhea, nausea and vomiting.  Endocrine: Negative for cold intolerance and heat intolerance.  Genitourinary: Negative for difficulty urinating, dysuria and hematuria.  Musculoskeletal: Positive for arthralgias and myalgias. Negative for back pain, gait problem, joint swelling, neck pain and neck stiffness.  Skin: Negative for color change, pallor, rash and  wound.  Allergic/Immunologic: Negative for environmental allergies and food allergies.  Neurological: Negative for dizziness, tremors, seizures, syncope, facial asymmetry, speech difficulty, weakness, light-headedness, numbness and headaches.  Hematological: Negative for adenopathy. Does not bruise/bleed easily.  Psychiatric/Behavioral: Positive for sleep disturbance. Negative for agitation and confusion. The patient is not nervous/anxious.        Objective:   Physical Exam  Constitutional: She is oriented to person, place, and time. Vital signs are normal. She appears well-developed and well-nourished. She is active.  Non-toxic appearance. She does not have a sickly appearance. She does not appear ill. No distress.  HENT:  Head: Normocephalic and atraumatic.  Right Ear: Hearing, external ear and ear canal normal. A middle ear effusion is present.  Left Ear: Hearing and external ear normal. A middle ear effusion is present.  Nose: Right sinus exhibits no maxillary sinus tenderness and no frontal sinus tenderness. Left sinus exhibits no maxillary sinus tenderness and no frontal sinus tenderness.  Mouth/Throat: Uvula is midline, oropharynx is clear and moist and mucous membranes are normal. No oropharyngeal exudate, posterior oropharyngeal edema or posterior oropharyngeal erythema.  Bilateral TMs air fluid level clear; left auditory canal slight macular erythema 3-9 oclock; bilateral allergic shiners; cobblestoning posterior pharynx; bilateral nasal turbinates edema/erythema  Eyes: Conjunctivae, EOM and lids are normal. Pupils are equal, round, and reactive to light. Right eye exhibits no discharge. Left eye exhibits no discharge. No scleral icterus.  Neck: Trachea normal and normal range of motion. Neck supple. No tracheal tenderness, no spinous process tenderness and no muscular tenderness present. No neck rigidity. No  tracheal deviation, no edema, no erythema and normal range of motion present.   Cardiovascular: Normal rate, regular rhythm, normal heart sounds and intact distal pulses.  Exam reveals no gallop and no friction rub.   No murmur heard. Pulses:      Radial pulses are 2+ on the right side, and 2+ on the left side.  Pulmonary/Chest: Effort normal and breath sounds normal. No stridor. No respiratory distress. She has no decreased breath sounds. She has no wheezes. She has no rhonchi. She has no rales. She exhibits no tenderness.  Abdominal: Soft. There is no guarding.  Musculoskeletal: Normal range of motion. She exhibits tenderness. She exhibits no edema or deformity.       Right shoulder: She exhibits tenderness and pain. She exhibits normal range of motion, no bony tenderness, no swelling, no effusion, no crepitus, no deformity, no laceration, no spasm, normal pulse and normal strength.       Left shoulder: Normal.       Right elbow: Normal.      Left elbow: Normal.       Right wrist: Normal.       Left wrist: Normal.       Right hip: Normal.       Left hip: Normal.       Right knee: Normal.       Left knee: Normal.       Right ankle: Normal.       Left ankle: Normal.       Cervical back: Normal.       Thoracic back: Normal.       Lumbar back: Normal.       Arms:      Right hand: Normal.       Left hand: Normal.  Lymphadenopathy:       Head (right side): No submental, no submandibular, no tonsillar, no preauricular, no posterior auricular and no occipital adenopathy present.       Head (left side): No submental, no submandibular, no tonsillar, no preauricular, no posterior auricular and no occipital adenopathy present.    She has no cervical adenopathy.       Right cervical: No superficial cervical, no deep cervical and no posterior cervical adenopathy present.      Left cervical: No superficial cervical, no deep cervical and no posterior cervical adenopathy present.  Neurological: She is alert and oriented to person, place, and time. She has normal reflexes. She  displays no atrophy, no tremor and normal reflexes. No cranial nerve deficit or sensory deficit. She exhibits normal muscle tone. She displays no seizure activity. Coordination and gait normal. GCS eye subscore is 4. GCS verbal subscore is 5. GCS motor subscore is 6.  Reflex Scores:      Brachioradialis reflexes are 2+ on the right side and 2+ on the left side.      Patellar reflexes are 2+ on the right side and 2+ on the left side. Bilateral hand grasp equal 5/5  Skin: Skin is warm, dry and intact. No abrasion, no bruising, no burn, no ecchymosis, no laceration, no lesion, no petechiae, no purpura and no rash noted. Rash is not macular, not papular, not maculopapular, not nodular, not pustular, not vesicular and not urticarial. She is not diaphoretic. No cyanosis or erythema. No pallor. Nails show no clubbing.  Psychiatric: She has a normal mood and affect. Her speech is normal and behavior is normal. Judgment and thought content normal. Cognition and memory are normal.  Nursing  note and vitals reviewed.         Assessment & Plan:  A-right proximal biceps tendonitis and right scapular strain; bilateral otitis media effusion  P-applied thermacare patch to right neck/shoulder posterior (scapular area) discussed with patient to remove in 8 hours.  Given 4 UD biofreeze gel from clinic stock and 8 UD tylenol 1000mg  po QID prn from clinic stock.  Dispensed from [facility] flexeril 10mg  (take 1/2 to 1 tab) po QHS prn muscle spasms.  Shoulder and neck/arm exercises per rehab exercises given to patient.  If spasms/stiffness heat 15 minutes four times a day.  If acute pain during the day ice 15 minutes and biofreeze topical QID prn.  Continue diclofenac 75mg  po BID prn at home.  Discussed avoid motrin/advil/aleve/ibuprofen/naproxen while on diclofenac.  Hydrate.  Discussed flexeril can cause drowsiness avoid alcohol and driving after taking.  Discussed workspace and lifting ergonomics at work and home e.g. Holding  loads close to body using legs ensuring laptop/PC screen at eye level, avoiding prolonged tablet/phone holding in outstretched arm.  Tylenol 1000mg  po QID prn pain and/or diclofenac.  Discussed with patient typical symptoms of pinched nerve, muscle strain.  Seek immediate re-evaluation if arm/leg weakness, bowel/bladder incontinence/saddle parethesias.  Reviewed xrays from [facility] Jan 2018 showed cervical and lumbar degenerative changes .Exitcare handouts on biceps tendonitis and shoulder exercises and work ergonomics given to patient.  Follow up re-evaluation new/worsening or no improvement with plan of care.  Patient verbalized understanding information/instructions, agreed with plan of care and had no further questions at this time.  If ear pain patient to notify me as currently just effusion no acute infection and will Rx amoxicillin 875mg  po BID x 10 days #20 RF0  Consider fluticasone 1 spray each nostril BID and/or antihistamine OTC of choice for post nasal drip.  Supportive treatment.   No evidence of invasive bacterial infection, non toxic and well hydrated.  This is most likely self limiting viral infection.  I do not see where any further testing or imaging is necessary at this time.   I will suggest supportive care, rest, good hygiene and encourage the patient to take adequate fluids.  The patient is to return to clinic or EMERGENCY ROOM if symptoms worsen or change significantly e.g. ear pain, fever, purulent discharge from ears or bleeding. Patient verbalized agreement and understanding of treatment plan.

## 2017-02-26 ENCOUNTER — Ambulatory Visit: Payer: No Typology Code available for payment source | Admitting: Physician Assistant

## 2017-03-15 ENCOUNTER — Ambulatory Visit (INDEPENDENT_AMBULATORY_CARE_PROVIDER_SITE_OTHER): Payer: No Typology Code available for payment source | Admitting: Emergency Medicine

## 2017-03-15 VITALS — BP 142/89 | HR 89 | Temp 98.1°F | Resp 18 | Ht 61.0 in | Wt 201.8 lb

## 2017-03-15 DIAGNOSIS — M791 Myalgia: Secondary | ICD-10-CM | POA: Diagnosis not present

## 2017-03-15 DIAGNOSIS — S46811A Strain of other muscles, fascia and tendons at shoulder and upper arm level, right arm, initial encounter: Secondary | ICD-10-CM | POA: Diagnosis not present

## 2017-03-15 DIAGNOSIS — M7918 Myalgia, other site: Secondary | ICD-10-CM

## 2017-03-15 DIAGNOSIS — M25511 Pain in right shoulder: Secondary | ICD-10-CM | POA: Insufficient documentation

## 2017-03-15 MED ORDER — TRAMADOL HCL 50 MG PO TABS: 50.0000 mg | ORAL_TABLET | Freq: Three times a day (TID) | ORAL | 0 refills | Status: DC | PRN

## 2017-03-15 MED ORDER — DICLOFENAC SODIUM 75 MG PO TBEC: 75.0000 mg | DELAYED_RELEASE_TABLET | Freq: Two times a day (BID) | ORAL | 0 refills | Status: AC

## 2017-03-15 MED ORDER — CYCLOBENZAPRINE HCL 5 MG PO TABS: 2.5000 mg | ORAL_TABLET | Freq: Three times a day (TID) | ORAL | 1 refills | Status: DC | PRN

## 2017-03-15 NOTE — Patient Instructions (Addendum)
     IF you received an x-ray today, you will receive an invoice from [facility]. Please contact [facility] at [phone] with questions or concerns regarding your invoice.   IF you received labwork today, you will receive an invoice from [facility]. Please contact [facility] at [phone] with questions or concerns regarding your invoice.   Our billing staff will not be able to assist you with questions regarding bills from these companies.  You will be contacted with the lab results as soon as they are available. The fastest way to get your results is to activate your My Chart account. Instructions are located on the last page of this paperwork. If you have not heard from us regarding the results in 2 weeks, please contact this office.      Shoulder Pain Many things can cause shoulder pain, including:  An injury.  Moving the arm in the same way again and again (overuse).  Joint pain (arthritis). Follow these instructions at home: Take these actions to help with your pain:  Squeeze a soft ball or a foam pad as much as you can. This helps to prevent swelling. It also makes the arm stronger.  Take over-the-counter and prescription medicines only as told by your doctor.  If told, put ice on the area:  Put ice in a plastic bag.  Place a towel between your skin and the bag.  Leave the ice on for 20 minutes, 2-3 times per day. Stop putting on ice if it does not help with the pain.  If you were given a shoulder sling or immobilizer:  Wear it as told.  Remove it to shower or bathe.  Move your arm as little as possible.  Keep your hand moving. This helps prevent swelling. Contact a doctor if:  Your pain gets worse.  Medicine does not help your pain.  You have new pain in your arm, hand, or fingers. Get help right away if:  Your arm, hand, or fingers:  Tingle.  Are numb.  Are swollen.  Are painful.  Turn white or blue. This information is  not intended to replace advice given to you by your health care provider. Make sure you discuss any questions you have with your health care provider. Document Released: 05/01/2008 Document Revised: 07/09/2016 Document Reviewed: 03/08/2015 Elsevier Interactive Patient Education  2017 [employer]  

## 2017-03-15 NOTE — Progress Notes (Signed)
Stephanie Chandler 51 y.o.   Chief Complaint  Patient presents with  . Shoulder Pain    right shoulder   pt would like the cortizone shot     HISTORY OF PRESENT ILLNESS: This is a 51 y.o. female complaining of right shoulder pain.  Shoulder Pain   The pain is present in the right shoulder. This is a new problem. The current episode started in the past 7 days. There has been no history of extremity trauma. The problem occurs constantly. The problem has been waxing and waning. The pain is at a severity of 6/10. The pain is moderate. Associated symptoms include an inability to bear weight, a limited range of motion and stiffness. Pertinent negatives include no fever, itching, numbness or tingling. The symptoms are aggravated by activity.     Prior to Admission medications   Medication Sig Start Date End Date Taking? Stephanie Chandler  cyclobenzaprine (FLEXERIL) 5 MG tablet Take 0.5-1 tablets (2.5-5 mg total) by mouth 3 (three) times daily as needed for muscle spasms. 12/27/16  Yes Stephanie Chandler  diclofenac (VOLTAREN) 75 MG EC tablet Take 75 mg by mouth as needed.   Yes Stephanie Provider, MD  doxycycline (VIBRAMYCIN) 100 MG capsule Take 100 mg by mouth 2 (two) times daily.   Yes Stephanie Provider, MD    Allergies  Allergen Reactions  . Flagyl [Metronidazole] Hives    Patient Active Problem List   Diagnosis Date Noted  . OSA (obstructive sleep apnea) 08/23/2015  . Sciatica 11/13/2012    No past medical history on file.  Past Surgical History:  Procedure Laterality Date  . HYSTERECTOMY ABDOMINAL WITH SALPINGECTOMY  1997    Social History   Social History  . Marital status: Single    Spouse name: N/A  . Number of children: N/A  . Years of education: N/A   Occupational History  . Not on file.   Social History Main Topics  . Smoking status: Light Tobacco Smoker    Packs/day: 0.12    Years: 0.50    Types: Cigarettes  . Smokeless tobacco: Never Used  .  Alcohol use 0.6 oz/week    1 Glasses of wine per week     Comment: 1 glass a year  . Drug use: No  . Sexual activity: No   Other Topics Concern  . Not on file   Social History Narrative  . No narrative on file    Family History  Problem Relation Age of Onset  . Diabetes Mother   . Hypertension Mother   . Arthritis Mother   . Diabetes Father   . Kidney disease Father   . Heart disease Maternal Grandmother   . Diabetes Paternal Grandmother      Review of Systems  Constitutional: Negative for chills, fever and weight loss.  HENT: Negative.   Eyes: Negative.   Respiratory: Negative.  Negative for cough and shortness of breath.   Cardiovascular: Negative.  Negative for chest pain and palpitations.  Gastrointestinal: Negative for abdominal pain, diarrhea, nausea and vomiting.  Genitourinary: Negative for dysuria and hematuria.  Musculoskeletal: Positive for back pain (upper back), joint pain (right shoulder) and stiffness.  Skin: Negative for itching and rash.  Neurological: Negative for dizziness, tingling, sensory change, focal weakness, numbness and headaches.  Endo/Heme/Allergies: Negative.   All other systems reviewed and are negative.  Vitals:   03/15/17 1809  BP: (!) 142/89  Pulse: 89  Resp: 18  Temp: 98.1 F (36.7 C)  Physical Exam  Constitutional: She is oriented to person, place, and time. She appears well-developed and well-nourished.  HENT:  Head: Normocephalic and atraumatic.  Nose: Nose normal.  Mouth/Throat: Oropharynx is clear and moist.  Eyes: Conjunctivae and EOM are normal. Pupils are equal, round, and reactive to light.  Neck: Normal range of motion. Neck supple. No JVD present. No thyromegaly present.  Cardiovascular: Normal rate, regular rhythm and normal heart sounds.   Pulmonary/Chest: Effort normal and breath sounds normal.  Abdominal: Soft. Bowel sounds are normal. She exhibits no distension. There is no tenderness.  Musculoskeletal:    Right shoulder: LROM, no erythema, no swelling, or tenderness. +right sided trapezius muscle tenderness.  Lymphadenopathy:    She has no cervical adenopathy.  Neurological: She is alert and oriented to person, place, and time. No sensory deficit. She exhibits normal muscle tone.  Skin: Skin is warm and dry. Capillary refill takes less than 2 seconds.  Psychiatric: She has a normal mood and affect. Her behavior is normal.  Vitals reviewed.    ASSESSMENT & PLAN: Stephanie Chandler was seen today for shoulder pain.  Diagnoses and all orders for this visit:  AcutJearldeanpain of right shoulder -     Ambulatory referral to Orthopedic Surgery  Strain of right trapezius muscle, initial encounter  Musculoskeletal pain  Other orders -     cyclobenzaprine (FLEXERIL) 5 MG tablet; Take 0.5-1 tablets (2.5-5 mg total) by mouth 3 (three) times daily as needed for muscle spasms. -     diclofenac (VOLTAREN) 75 MG EC tablet; Take 1 tablet (75 mg total) by mouth 2 (two) times daily. -     traMADol (ULTRAM) 50 MG tablet; Take 1 tablet (50 mg total) by mouth every 8 (eight) hours as needed.    Patient Instructions       IF you received an x-ray today, you will receive an invoice from [facility]. Please contact [facility] at [phone] with questions or concerns regarding your invoice.   IF you received labwork today, you will receive an invoice from [facility]. Please contact [facility] at [phone] with questions or concerns regarding your invoice.   Our billing staff will not be able to assist you with questions regarding bills from these companies.  You will be contacted with the lab results as soon as they are available. The fastest way to get your results is to activate your My Chart account. Instructions are located on the last page of this paperwork. If you have not heard from [place] regarding the results in 2 weeks, please contact this office.      Shoulder Pain Many things can  cause shoulder pain, including:  An injury.  Moving the arm in the same way again and again (overuse).  Joint pain (arthritis). Follow these instructions at home: Take these actions to help with your pain:  Squeeze a soft ball or a foam pad as much as you can. This helps to prevent swelling. It also makes the arm stronger.  Take over-the-counter and prescription medicines only as told by your doctor.  If told, put ice on the area:  Put ice in a plastic bag.  Place a towel between your skin and the bag.  Leave the ice on for 20 minutes, 2-3 times per day. Stop putting on ice if it does not help with the pain.  If you were given a shoulder sling or immobilizer:  Wear it as told.  Remove it to shower or bathe.  Move your arm  as little as possible.  Keep your hand moving. This helps prevent swelling. Contact a doctor if:  Your pain gets worse.  Medicine does not help your pain.  You have new pain in your arm, hand, or fingers. Get help right away if:  Your arm, hand, or fingers:  Tingle.  Are numb.  Are swollen.  Are painful.  Turn white or blue. This information is not intended to replace advice given to you by your health care Chandler. Make sure you discuss any questions you have with your health care Chandler. Document Released: 05/01/2008 Document Revised: 07/09/2016 Document Reviewed: 03/08/2015 Elsevier Interactive Patient Education  2017 Elsevier Inc.      Edwina Barth, MD [facility]

## 2017-04-12 ENCOUNTER — Ambulatory Visit (INDEPENDENT_AMBULATORY_CARE_PROVIDER_SITE_OTHER): Payer: No Typology Code available for payment source | Admitting: Orthopedic Surgery

## 2017-04-26 ENCOUNTER — Encounter (INDEPENDENT_AMBULATORY_CARE_PROVIDER_SITE_OTHER): Payer: Self-pay | Admitting: Orthopedic Surgery

## 2017-04-26 ENCOUNTER — Ambulatory Visit (INDEPENDENT_AMBULATORY_CARE_PROVIDER_SITE_OTHER): Payer: No Typology Code available for payment source

## 2017-04-26 ENCOUNTER — Encounter (INDEPENDENT_AMBULATORY_CARE_PROVIDER_SITE_OTHER): Payer: Self-pay

## 2017-04-26 ENCOUNTER — Ambulatory Visit (INDEPENDENT_AMBULATORY_CARE_PROVIDER_SITE_OTHER): Payer: No Typology Code available for payment source | Admitting: Orthopedic Surgery

## 2017-04-26 DIAGNOSIS — G8929 Other chronic pain: Secondary | ICD-10-CM

## 2017-04-26 DIAGNOSIS — M25511 Pain in right shoulder: Secondary | ICD-10-CM | POA: Diagnosis not present

## 2017-04-26 NOTE — Progress Notes (Signed)
Office Visit Note   Patient: Stephanie Chandler           Date of Birth: 21-Mar-1966           MRN: [id number] Visit Date: 04/26/2017 Requested by: Georgina Quint, MD [address] [place], [place] [address] PCP: Hal Morales, MD  Subjective: Chief Complaint  Patient presents with  . Right Shoulder - Pain    HPI: Stephanie Chandler is a 51 year old patient with right shoulder pain.  Denies any history of injury.  Since going on for several months.  She localizes the pain to the trapezial region as well as posteriorly along the joint.  Denies any numbness and tingling.  She denies any functional weakness.  She does report occasional neck pain.  She cannot sleep on that side.  She works at [employer].  She is right-hand dominant.  She describes a burning-type pain.  She has tried Flexeril and Tylenol and tramadol for her symptoms              ROS: All systems reviewed are negative as they relate to the chief complaint within the history of present illness.  Patient denies  fevers or chills.   Assessment & Plan: Visit Diagnoses:  1. Chronic right shoulder pain     Plan: impression is right shoulder pain with fairly profound supraspinatus and infraspinatus weakness without evidence of rotator cuff tearing on exam or by history.  There is no real biceps and triceps weakness or paresthesias.  Differential diagnosis includes suprascapular nerve compression versus spinoglenoid notch cyst versus radicular nerve compression from the neck.  Plan MRI arthrogram right shoulder due to the weakness that she has.  Also refer to Dr. Ladene Artist to evaluate suprascapular nerve compression versus radiculopathy.  I'll see her back after those studies.  Workup is indicated due to the amount of weakness that she has on exam  Follow-Up Instructions: No Follow-up on file.   Orders:  Orders Placed This Encounter  Procedures  . XR Shoulder Right  . MR SHOULDER RIGHT WO CONTRAST  . Ambulatory referral to Physical  Medicine Rehab   No orders of the defined types were placed in this encounter.     Procedures: No procedures performed   Clinical Data: No additional findings.  Objective: Vital Signs: There were no vitals taken for this visit.  Physical Exam:   Constitutional: Patient appears well-developed HEENT:  Head: Normocephalic Eyes:EOM are normal Neck: Normal range of motion Cardiovascular: Normal rate Pulmonary/chest: Effort normal Neurologic: Patient is alert Skin: Skin is warm Psychiatric: Patient has normal mood and affect    Ortho Exam: orthopedic exam demonstrates good cervical spine range of motion 5 out of 5 grip EPL FPL interosseous wrist flexion-extension biceps triceps and deltoid strength.  Patient has pretty profound external rotation weakness on the right compared to the left.  She also has good subscap strength right and left side the belly press test but weakness to supraspinatus testing as well.  O'Brien's testing equivocal on the right negative on the left.  No definite impingement signs noted.  No other masses lymph no vision changes noted in the right shoulder region.  Radial pulses intact.  Specialty Comments:  No specialty comments available.  Imaging: Xr Shoulder Right  Result Date: 04/26/2017 AP lateral and outlet view right shoulder reviewed shows normal visualized lung fields.  Normal glenohumeral joint.  Normal acromioclavicular joint.  No evidence of trauma or fracture.  No other soft tissue calcifications.  Normal shoulder.  PMFS History: Patient Active Problem List   Diagnosis Date Noted  . Acute pain of right shoulder 03/15/2017  . Strain of right trapezius muscle 03/15/2017  . Musculoskeletal pain 03/15/2017  . OSA (obstructive sleep apnea) 08/23/2015  . Sciatica 11/13/2012   No past medical history on file.  Family History  Problem Relation Age of Onset  . Diabetes Mother   . Hypertension Mother   . Arthritis Mother   . Diabetes  Father   . Kidney disease Father   . Heart disease Maternal Grandmother   . Diabetes Paternal Grandmother     Past Surgical History:  Procedure Laterality Date  . HYSTERECTOMY ABDOMINAL WITH SALPINGECTOMY  1997   Social History   Occupational History  . Not on file.   Social History Main Topics  . Smoking status: Light Tobacco Smoker    Packs/day: 0.12    Years: 0.50    Types: Cigarettes  . Smokeless tobacco: Never Used  . Alcohol use 0.6 oz/week    1 Glasses of wine per week     Comment: 1 glass a year  . Drug use: No  . Sexual activity: No

## 2017-04-30 ENCOUNTER — Other Ambulatory Visit (INDEPENDENT_AMBULATORY_CARE_PROVIDER_SITE_OTHER): Payer: Self-pay | Admitting: Orthopedic Surgery

## 2017-04-30 DIAGNOSIS — G8929 Other chronic pain: Secondary | ICD-10-CM

## 2017-04-30 DIAGNOSIS — M25511 Pain in right shoulder: Principal | ICD-10-CM

## 2017-05-15 ENCOUNTER — Ambulatory Visit: Payer: Self-pay | Admitting: *Deleted

## 2017-05-15 ENCOUNTER — Other Ambulatory Visit: Payer: Self-pay

## 2017-05-17 ENCOUNTER — Ambulatory Visit: Payer: Self-pay | Admitting: Registered Nurse

## 2017-05-17 VITALS — BP 101/85 | HR 94 | Temp 98.4°F

## 2017-05-17 DIAGNOSIS — J301 Allergic rhinitis due to pollen: Secondary | ICD-10-CM

## 2017-05-17 DIAGNOSIS — J0101 Acute recurrent maxillary sinusitis: Secondary | ICD-10-CM

## 2017-05-17 DIAGNOSIS — H6593 Unspecified nonsuppurative otitis media, bilateral: Secondary | ICD-10-CM

## 2017-05-17 MED ORDER — SALINE SPRAY 0.65 % NA SOLN: 2.0000 | NASAL | 0 refills | Status: DC

## 2017-05-17 MED ORDER — FLUTICASONE PROPIONATE 50 MCG/ACT NA SUSP: 1.0000 | Freq: Two times a day (BID) | NASAL | 0 refills | Status: DC

## 2017-05-17 MED ORDER — AMOXICILLIN-POT CLAVULANATE 875-125 MG PO TABS: 1.0000 | ORAL_TABLET | Freq: Two times a day (BID) | ORAL | 0 refills | Status: AC

## 2017-05-17 MED ORDER — ACETAMINOPHEN 500 MG PO TABS: 1000.0000 mg | ORAL_TABLET | Freq: Four times a day (QID) | ORAL | 0 refills | Status: DC | PRN

## 2017-05-17 NOTE — Progress Notes (Signed)
Subjective:    Patient ID: Stephanie Chandler, female    DOB: 02-15-1966, 51 y.o.   MRN: [id number]  51y/o african [place] female established patient reports frontal and maxillary sinus pressure since yesterday; has noticed upper teeth discomfort. Nasal drainage and congestion. Reports some bilateral ear irritation, denies pain or ear drainage. Has been using flonase and OTC antihistamine without relief.  Tylenol po prn pain.  Trying to stay inside as seasonal allergies bothering her.  right ear discomfort "wetness" more than left      Review of Systems  Constitutional: Negative for activity change, appetite change, chills, diaphoresis, fatigue, fever and unexpected weight change.  HENT: Positive for congestion, ear pain, postnasal drip, rhinorrhea, sinus pain and sinus pressure. Negative for dental problem, drooling, ear discharge, facial swelling, hearing loss, mouth sores, nosebleeds, sneezing, sore throat, tinnitus, trouble swallowing and voice change.   Eyes: Negative for photophobia, pain, discharge, redness, itching and visual disturbance.  Respiratory: Negative for cough, choking, chest tightness, shortness of breath, wheezing and stridor.   Cardiovascular: Negative for chest pain, palpitations and leg swelling.  Gastrointestinal: Negative for abdominal distention, abdominal pain, blood in stool, constipation, diarrhea, nausea and vomiting.  Endocrine: Negative for cold intolerance and heat intolerance.  Genitourinary: Negative for difficulty urinating, dysuria and hematuria.  Musculoskeletal: Negative for arthralgias, back pain, gait problem, joint swelling, myalgias, neck pain and neck stiffness.  Skin: Negative for color change, pallor, rash and wound.  Allergic/Immunologic: Positive for environmental allergies. Negative for food allergies.  Neurological: Positive for headaches. Negative for dizziness, tremors, seizures, syncope, facial asymmetry, speech difficulty, weakness,  light-headedness and numbness.  Hematological: Negative for adenopathy. Does not bruise/bleed easily.  Psychiatric/Behavioral: Negative for agitation, behavioral problems, confusion and sleep disturbance.       Objective:   Physical Exam  Constitutional: She is oriented to person, place, and time. Vital signs are normal. She appears well-developed and well-nourished. She is active and cooperative.  Non-toxic appearance. She does not have a sickly appearance. She appears ill. No distress.  HENT:  Head: Normocephalic and atraumatic.  Right Ear: Hearing, external ear and ear canal normal. Tympanic membrane is erythematous and bulging. A middle ear effusion is present.  Left Ear: Hearing, external ear and ear canal normal. Tympanic membrane is erythematous and bulging. A middle ear effusion is present.  Nose: Mucosal edema and rhinorrhea present. No nose lacerations, sinus tenderness, nasal deformity, septal deviation or nasal septal hematoma. No epistaxis.  No foreign bodies. Right sinus exhibits maxillary sinus tenderness and frontal sinus tenderness. Left sinus exhibits maxillary sinus tenderness and frontal sinus tenderness.  Mouth/Throat: Uvula is midline and mucous membranes are normal. Mucous membranes are not pale, not dry and not cyanotic. She does not have dentures. No oral lesions. No trismus in the jaw. Normal dentition. No dental abscesses, uvula swelling, lacerations or dental caries. Posterior oropharyngeal edema and posterior oropharyngeal erythema present. No oropharyngeal exudate or tonsillar abscesses.  Bilateral maxillary greater than frontal sinuses TTP; bilateral TMs air fluid level erythema bulging right greater than left; cobblestoning posterior pharynx; bilateral nasal Turbinates with edema/erythema clear discharge; bilateral allergic shiners; bilateral lower eyelid edema 1+/4 nonpitting  Eyes: Conjunctivae, EOM and lids are normal. Pupils are equal, round, and reactive to light.  Right eye exhibits no chemosis, no discharge, no exudate and no hordeolum. No foreign body present in the right eye. Left eye exhibits no chemosis, no discharge, no exudate and no hordeolum. No foreign body present in the left eye. Right  conjunctiva is not injected. Right conjunctiva has no hemorrhage. Left conjunctiva is not injected. Left conjunctiva has no hemorrhage. No scleral icterus. Right eye exhibits normal extraocular motion and no nystagmus. Left eye exhibits normal extraocular motion and no nystagmus. Right pupil is round and reactive. Left pupil is round and reactive. Pupils are equal.  Neck: Trachea normal, normal range of motion and phonation normal. Neck supple. No tracheal tenderness, no spinous process tenderness and no muscular tenderness present. No neck rigidity. No tracheal deviation, no edema, no erythema and normal range of motion present. No thyroid mass and no thyromegaly present.  Cardiovascular: Normal rate, regular rhythm, S1 normal, S2 normal, normal heart sounds and intact distal pulses.  PMI is not displaced.  Exam reveals no gallop and no friction rub.   No murmur heard. Pulmonary/Chest: Effort normal and breath sounds normal. No accessory muscle usage or stridor. No respiratory distress. She has no decreased breath sounds. She has no wheezes. She has no rhonchi. She has no rales. She exhibits no tenderness.  Abdominal: Soft. Normal appearance. She exhibits no distension.  Musculoskeletal: Normal range of motion. She exhibits no edema or tenderness.       Right shoulder: Normal.       Left shoulder: Normal.       Right hip: Normal.       Left hip: Normal.       Right knee: Normal.       Left knee: Normal.       Cervical back: Normal.       Right hand: Normal.       Left hand: Normal.  Lymphadenopathy:       Head (right side): No submental, no submandibular, no tonsillar, no preauricular, no posterior auricular and no occipital adenopathy present.       Head (left  side): No submental, no submandibular, no tonsillar, no preauricular, no posterior auricular and no occipital adenopathy present.    She has no cervical adenopathy.       Right cervical: No superficial cervical, no deep cervical and no posterior cervical adenopathy present.      Left cervical: No superficial cervical, no deep cervical and no posterior cervical adenopathy present.  Neurological: She is alert and oriented to person, place, and time. She has normal strength. She is not disoriented. She displays no atrophy and no tremor. No cranial nerve deficit or sensory deficit. She exhibits normal muscle tone. She displays no seizure activity. Coordination and gait normal. GCS eye subscore is 4. GCS verbal subscore is 5. GCS motor subscore is 6.  Gait sure and steady; on/off exam table; in/out of chair without difficulty  Skin: Skin is warm, dry and intact. No abrasion, no bruising, no burn, no ecchymosis, no laceration, no lesion, no petechiae and no rash noted. She is not diaphoretic. No cyanosis or erythema. No pallor. Nails show no clubbing.  Psychiatric: She has a normal mood and affect. Her speech is normal and behavior is normal. Judgment and thought content normal. Cognition and memory are normal.  Nursing note and vitals reviewed.         Assessment & Plan:  A-acute maxillary sinusitis, recurrent, allergic rhinitis, bilateral otitis media  P-Rx augmentin 875mg  po BID x 10 days #20 RF0 dispensed from [facility].  Has flonase at home continue 1 spray each nostril BID and Patient may use normal saline nasal spray 2 sprays each nostril q2h wa as needed.  Continue antihistamine OTC and nasal steroid use.  Avoid  triggers if possible.  Shower prior to bedtime if exposed to triggers.  If allergic dust/dust mites recommend mattress/pillow covers/encasements; washing linens, vacuuming, sweeping, dusting weekly.  Call or return to clinic as needed if these symptoms worsen or fail to improve as anticipated.    Exitcare handout on allergic rhinitis given to patient.  Patient verbalized understanding of instructions, agreed with plan of care and had no further questions at this time.  P2:  Avoidance and hand washing.  Supportive treatment.   No evidence of invasive bacterial infection, non toxic and well hydrated.  This is most likely self limiting viral infection.  I do not see where any further testing or imaging is necessary at this time.   I will suggest supportive care, rest, good hygiene and encourage the patient to take adequate fluids.  The patient is to return to clinic or EMERGENCY ROOM if symptoms worsen or change significantly e.g. ear pain, fever, purulent discharge from ears or bleeding.  Exitcare handout on otitis media given to patient.  Patient verbalized agreement and understanding of treatment plan.    Augmentin, flonase, nasal saline as above Rx given.  Tylenol 1000mg  po QID prn pain.  8 UD packages given to patient from clinic stock.  No evidence of systemic bacterial infection, non toxic and well hydrated.  I do not see where any further testing or imaging is necessary at this time.   I will suggest supportive care, rest, good hygiene and encourage the patient to take adequate fluids.  The patient is to return to clinic or EMERGENCY ROOM if symptoms worsen or change significantly.  Exitcare handout on sinusitis given to patient.  Patient verbalized agreement and understanding of treatment plan and had no further questions at this time.   P2:  Hand washing and cover cough

## 2017-05-17 NOTE — Patient Instructions (Signed)
Otitis Media, Adult Otitis media occurs when there is inflammation and fluid in the middle ear. Your middle ear is a part of the ear that contains bones for hearing as well as air that helps send sounds to your brain. What are the causes? This condition is caused by a blockage in the eustachian tube. This tube drains fluid from the ear to the back of the nose (nasopharynx). A blockage in this tube can be caused by an object or by swelling (edema) in the tube. Problems that can cause a blockage include:  A cold or other upper respiratory infection.  Allergies.  An irritant, such as tobacco smoke.  Enlarged adenoids. The adenoids are areas of soft tissue located high in the back of the throat, behind the nose and the roof of the mouth.  A mass in the nasopharynx.  Damage to the ear caused by pressure changes (barotrauma). What are the signs or symptoms? Symptoms of this condition include:  Ear pain.  A fever.  Decreased hearing.  A headache.  Tiredness (lethargy).  Fluid leaking from the ear.  Ringing in the ear. How is this diagnosed? This condition is diagnosed with a physical exam. During the exam your health care provider will use an instrument called an otoscope to look into your ear and check for redness, swelling, and fluid. He or she will also ask about your symptoms. Your health care provider may also order tests, such as:  A test to check the movement of the eardrum (pneumatic otoscopy). This test is done by squeezing a small amount of air into the ear.  A test that changes air pressure in the middle ear to check how well the eardrum moves and whether the eustachian tube is working (tympanogram). How is this treated? This condition usually goes away on its own within 3-5 days. But if the condition is caused by a bacteria infection and does not go away own its own, or keeps coming back, your health care provider may:  Prescribe antibiotic medicines to treat the  infection.  Prescribe or recommend medicines to control pain. Follow these instructions at home:  Take over-the-counter and prescription medicines only as told by your health care provider.  If you were prescribed an antibiotic medicine, take it as told by your health care provider. Do not stop taking the antibiotic even if you start to feel better.  Keep all follow-up visits as told by your health care provider. This is important. Contact a health care provider if:  You have bleeding from your nose.  There is a lump on your neck.  You are not getting better in 5 days.  You feel worse instead of better. Get help right away if:  You have severe pain that is not controlled with medicine.  You have swelling, redness, or pain around your ear.  You have stiffness in your neck.  A part of your face is paralyzed.  The bone behind your ear (mastoid) is tender when you touch it.  You develop a severe headache. Summary  Otitis media is redness, soreness, and swelling of the middle ear.  This condition usually goes away on its own within 3-5 days.  If the problem does not go away in 3-5 days, your health care provider may prescribe or recommend medicines to treat your symptoms.  If you were prescribed an antibiotic medicine, take it as told by your health care provider. This information is not intended to replace advice given to you by your   to you by your health care provider. Make sure you discuss any questions you have with your health care provider. Document Released: 08/18/2004 Document Revised: 11/03/2016 Document Reviewed: 11/03/2016 Elsevier Interactive Patient Education  2017 [employer] Inc. Sinus Rinse What is a sinus rinse? A sinus rinse is a simple home treatment that is used to rinse your sinuses with a sterile mixture of salt and water (saline solution). Sinuses are air-filled spaces in your skull behind the bones of your face and forehead that open into your nasal cavity. You will  use the following:  Saline solution.  Neti pot or spray bottle. This releases the saline solution into your nose and through your sinuses. Neti pots and spray bottles can be purchased at [facility] local [facility], a health food store, or online.  When would I do a sinus rinse? A sinus rinse can help to clear mucus, dirt, dust, or pollen from the nasal cavity. You may do a sinus rinse when you have a cold, a virus, nasal allergy symptoms, a sinus infection, or stuffiness in the nose or sinuses. If you are considering a sinus rinse:  Ask your child's health care provider before performing a sinus rinse on your child.  Do not do a sinus rinse if you have had ear or nasal surgery, ear infection, or blocked ears.  How do I do a sinus rinse?  Wash your hands.  Disinfect your device according to the directions provided and then dry it.  Use the solution that comes with your device or one that is sold separately in stores. Follow the mixing directions on the package.  Fill your device with the amount of saline solution as directed by the device instructions.  Stand over a sink and tilt your head sideways over the sink.  Place the spout of the device in your upper nostril (the one closer to the ceiling).  Gently pour or squeeze the saline solution into the nasal cavity. The liquid should drain to the lower nostril if you are not overly congested.  Gently blow your nose. Blowing too hard may cause ear pain.  Repeat in the other nostril.  Clean and rinse your device with clean water and then air-dry it. Are there risks of a sinus rinse? Sinus rinse is generally very safe and effective. However, there are a few risks, which include:  A burning sensation in the sinuses. This may happen if you do not make the saline solution as directed. Make sure to follow all directions when making the saline solution.  Infection from contaminated water. This is rare, but possible.  Nasal irritation.  This  information is not intended to replace advice given to you by your health care provider. Make sure you discuss any questions you have with your health care provider. Document Released: 06/10/2014 Document Revised: 10/10/2016 Document Reviewed: 03/31/2014 Elsevier Interactive Patient Education  2017 Elsevier Inc. Sinusitis, Adult Sinusitis is soreness and inflammation of your sinuses. Sinuses are hollow spaces in the bones around your face. Your sinuses are located:  Around your eyes.  In the middle of your forehead.  Behind your nose.  In your cheekbones.  Your sinuses and nasal passages are lined with a stringy fluid (mucus). Mucus normally drains out of your sinuses. When your nasal tissues become inflamed or swollen, the mucus can become trapped or blocked so air cannot flow through your sinuses. This allows bacteria, viruses, and funguses to grow, which leads to infection. Sinusitis can develop quickly and last for 7?10 days (  acute) or for more than 12 weeks (chronic). Sinusitis often develops after a cold. What are the causes? This condition is caused by anything that creates swelling in the sinuses or stops mucus from draining, including:  Allergies.  Asthma.  Bacterial or viral infection.  Abnormally shaped bones between the nasal passages.  Nasal growths that contain mucus (nasal polyps).  Narrow sinus openings.  Pollutants, such as chemicals or irritants in the air.  A foreign object stuck in the nose.  A fungal infection. This is rare.  What increases the risk? The following factors may make you more likely to develop this condition:  Having allergies or asthma.  Having had a recent cold or respiratory tract infection.  Having structural deformities or blockages in your nose or sinuses.  Having a weak immune system.  Doing a lot of swimming or diving.  Overusing nasal sprays.  Smoking.  What are the signs or symptoms? The main symptoms of this  condition are pain and a feeling of pressure around the affected sinuses. Other symptoms include:  Upper toothache.  Earache.  Headache.  Bad breath.  Decreased sense of smell and taste.  A cough that may get worse at night.  Fatigue.  Fever.  Thick drainage from your nose. The drainage is often green and it may contain pus (purulent).  Stuffy nose or congestion.  Postnasal drip. This is when extra mucus collects in the throat or back of the nose.  Swelling and warmth over the affected sinuses.  Sore throat.  Sensitivity to light.  How is this diagnosed? This condition is diagnosed based on symptoms, a medical history, and a physical exam. To find out if your condition is acute or chronic, your health care provider may:  Look in your nose for signs of nasal polyps.  Tap over the affected sinus to check for signs of infection.  View the inside of your sinuses using an imaging device that has a light attached (endoscope).  If your health care provider suspects that you have chronic sinusitis, you may also:  Be tested for allergies.  Have a sample of mucus taken from your nose (nasal culture) and checked for bacteria.  Have a mucus sample examined to see if your sinusitis is related to an allergy.  If your sinusitis does not respond to treatment and it lasts longer than 8 weeks, you may have an MRI or CT scan to check your sinuses. These scans also help to determine how severe your infection is. In rare cases, a bone biopsy may be done to rule out more serious types of fungal sinus disease. How is this treated? Treatment for sinusitis depends on the cause and whether your condition is chronic or acute. If a virus is causing your sinusitis, your symptoms will go away on their own within 10 days. You may be given medicines to relieve your symptoms, including:  Topical nasal decongestants. They shrink swollen nasal passages and let mucus drain from your  sinuses.  Antihistamines. These drugs block inflammation that is triggered by allergies. This can help to ease swelling in your nose and sinuses.  Topical nasal corticosteroids. These are nasal sprays that ease inflammation and swelling in your nose and sinuses.  Nasal saline washes. These rinses can help to get rid of thick mucus in your nose.  If your condition is caused by bacteria, you will be given an antibiotic medicine. If your condition is caused by a fungus, you will be given an antifungal medicine. Surgery  may be needed to correct underlying conditions, such as narrow nasal passages. Surgery may also be needed to remove polyps. Follow these instructions at home: Medicines  Take, use, or apply over-the-counter and prescription medicines only as told by your health care provider. These may include nasal sprays.  If you were prescribed an antibiotic medicine, take it as told by your health care provider. Do not stop taking the antibiotic even if you start to feel better. Hydrate and Humidify  Drink enough water to keep your urine clear or pale yellow. Staying hydrated will help to thin your mucus.  Use a cool mist humidifier to keep the humidity level in your home above 50%.  Inhale steam for 10-15 minutes, 3-4 times a day or as told by your health care provider. You can do this in the bathroom while a hot shower is running.  Limit your exposure to cool or dry air. Rest  Rest as much as possible.  Sleep with your head raised (elevated).  Make sure to get enough sleep each night. General instructions  Apply a warm, moist washcloth to your face 3-4 times a day or as told by your health care provider. This will help with discomfort.  Wash your hands often with soap and water to reduce your exposure to viruses and other germs. If soap and water are not available, use hand sanitizer.  Do not smoke. Avoid being around people who are smoking (secondhand smoke).  Keep all  follow-up visits as told by your health care provider. This is important. Contact a health care provider if:  You have a fever.  Your symptoms get worse.  Your symptoms do not improve within 10 days. Get help right away if:  You have a severe headache.  You have persistent vomiting.  You have pain or swelling around your face or eyes.  You have vision problems.  You develop confusion.  Your neck is stiff.  You have trouble breathing. This information is not intended to replace advice given to you by your health care provider. Make sure you discuss any questions you have with your health care provider. Document Released: 11/13/2005 Document Revised: 07/09/2016 Document Reviewed: 09/08/2015 Elsevier Interactive Patient Education  2017 [employer]

## 2017-05-18 ENCOUNTER — Telehealth (INDEPENDENT_AMBULATORY_CARE_PROVIDER_SITE_OTHER): Payer: Self-pay | Admitting: Orthopedic Surgery

## 2017-05-18 NOTE — Telephone Encounter (Signed)
PT CALLED AND LEFT VM ADVISING SHE COULDN'T GET HER MRI DUE TO NOT HAVING THE MONEY TO PAY FOR THIS, WANTS TO KNOW WHERE TO GO FROM HERE.  [phone]

## 2017-05-18 NOTE — Telephone Encounter (Signed)
Please advise. Thanks.  

## 2017-05-21 NOTE — Telephone Encounter (Signed)
MRI shoulder would be ideal.  If she can't do that she needs to get the nerve study from Dr. Alvester MorinNewton please call thanks

## 2017-05-22 NOTE — Telephone Encounter (Signed)
Called patient and left message for her to call to schedule. 

## 2017-05-22 NOTE — Telephone Encounter (Signed)
Can you please call patient about scheduling for EMG/NCV. She is ready to schedule. Unable to do MRI due to cost. Thanks so much.

## 2017-05-22 NOTE — Telephone Encounter (Signed)
See referral message

## 2017-06-12 ENCOUNTER — Encounter (INDEPENDENT_AMBULATORY_CARE_PROVIDER_SITE_OTHER): Payer: Self-pay | Admitting: Physical Medicine and Rehabilitation

## 2017-06-12 ENCOUNTER — Ambulatory Visit (INDEPENDENT_AMBULATORY_CARE_PROVIDER_SITE_OTHER): Payer: No Typology Code available for payment source | Admitting: Physical Medicine and Rehabilitation

## 2017-06-12 DIAGNOSIS — M542 Cervicalgia: Secondary | ICD-10-CM

## 2017-06-12 DIAGNOSIS — R202 Paresthesia of skin: Secondary | ICD-10-CM | POA: Diagnosis not present

## 2017-06-12 DIAGNOSIS — M25511 Pain in right shoulder: Secondary | ICD-10-CM

## 2017-06-12 DIAGNOSIS — G8929 Other chronic pain: Secondary | ICD-10-CM

## 2017-06-12 NOTE — Progress Notes (Deleted)
Pain in right shoulder with difficulty sleeping. Also hurts off and on through the day. Can't tell that it is related to any position or activity. Has had symptoms for months. Sometimes has tingling down arm. Right hand dominant.

## 2017-06-13 ENCOUNTER — Encounter (INDEPENDENT_AMBULATORY_CARE_PROVIDER_SITE_OTHER): Payer: Self-pay | Admitting: Physical Medicine and Rehabilitation

## 2017-06-13 NOTE — Progress Notes (Signed)
Stephanie Chandler - 51 y.o. female MRN [id number]  Date of birth: [birth year]  Office Visit Note: Visit Date: 06/12/2017 PCP: Hal Morales, MD Referred by: Hal Morales, MD  Subjective: Chief Complaint  Patient presents with  . Right Shoulder - Pain   HPI: Stephanie Chandler is a 51 year old right-hand dominant female under the care of Dr. August Saucer who request electrodiagnostic studies for the patient's chronic worsening right shoulder and shoulder blade pain with referral pattern down the arm into the forearm with some tingling and numbness. She reports that she has pain in the right shoulder and shoulder blade which is difficult for her to get any sleep. She says her shoulder and arm will hurt throughout the day off and on but it is intermittent. She can't tell any specific position or activity that seems to worsen it. She's had ongoing symptoms for several months. She does not relate it to a specific injury. She initially had a tingling pain down the arm and in that resolved to a degree in that she had more pain around the shoulder and shoulder blade. She denies any numbness tingling or paresthesias in the hands. She denies any left-sided complaints. She's had no prior electrodiagnostic studies. She does relate some neck pain at times. Dr. August Saucer felt that she was weak with external rotation of the shoulder and question specifically suprascapular nerve involvement.    ROS Otherwise per HPI.  Assessment & Plan: Visit Diagnoses:  1. Paresthesia of skin   2. Chronic right shoulder pain   3. Cervicalgia     Plan: No additional findings.  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 However, clinical correlation is paramount as this does not seem to fit her current symptoms. Also, due to the overlying size of the trapezius it is very hard to do that really good nerve  conduction study of the suprascapular nerve with the surface electrode over the supraspinatus muscle. I did complete needle EMG of the supraspinatus and infraspinatus though and these were normal. While I am fairly confident this is not a suprascapular nerve entrapment we cannot completely rule this out but there is no axonal damage seen with the needle EMG.  There was no evidence of cervical radiculopathy although clinically her symptoms fit more with a cervical radiculopathy versus a myofascial pain syndrome with trigger points in the levator scapula and rhomboideus to do reproduce her pain   Recommendations: 1.  Follow-up with referring physician. 2.  Continue current management of symptoms. 3.  Consider cervical MRI if felt necessary and not intrinsic shoulder pathology and patient would likely do well with physical therapy and dry needling.    Meds & Orders: No orders of the defined types were placed in this encounter.   Orders Placed This Encounter  Procedures  . NCV with EMG (electromyography)    Follow-up: Return for Dr. August Saucer as scheduled.   Procedures: No procedures performed  EMG & NCV Findings: Evaluation of the right median motor nerve showed prolonged distal onset latency (4.8 ms).  The right suprascapular motor nerve showed reduced amplitude (1.5 mV).  The right median (across palm) sensory nerve showed no response (Palm) and prolonged distal peak latency (4.5 ms).  All remaining nerves (as indicated in the following tables) were within normal limits.    Needle evaluation of the right deltoid muscle showed increased insertional activity.  All remaining muscles (  as indicated in the following table) showed no evidence of electrical instability.    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 However, clinical correlation is paramount as this does  not seem to fit her current symptoms. Also, due to the overlying size of the trapezius it is very hard to do that really good nerve conduction study of the suprascapular nerve with the surface electrode over the supraspinatus muscle. I did complete needle EMG of the supraspinatus and infraspinatus though and these were normal. While I am fairly confident this is not a suprascapular nerve entrapment we cannot completely rule this out but there is no axonal damage seen with the needle EMG.  There was no evidence of cervical radiculopathy although clinically her symptoms fit more with a cervical radiculopathy versus a myofascial pain syndrome with trigger points in the levator scapula and rhomboideus to do reproduce her pain   Recommendations: 1.  Follow-up with referring physician. 2.  Continue current management of symptoms. 3.  Consider cervical MRI if felt necessary and not intrinsic shoulder pathology and patient would likely do well with physical therapy and dry needling.    Nerve Conduction Studies Anti Sensory Summary Table   Stim Site NR Peak (ms) Norm Peak (ms) P-T Amp (V) Norm P-T Amp Site1 Site2 Delta-P (ms) Dist (cm) Vel (m/s) Norm Vel (m/s)  Right Median Acr Palm Anti Sensory (2nd Digit)  33.3C  Wrist    *4.5 <3.6 19.5 >10 Wrist Palm  0.0    Palm *NR  <2.0          Right Radial Anti Sensory (Base 1st Digit)  31.5C  Wrist    1.6 <3.1 28.5  Wrist Base 1st Digit 1.6 0.0    Right Ulnar Anti Sensory (5th Digit)  32.4C  Wrist    2.6 <3.7 27.9 >15.0 Wrist 5th Digit 2.6 14.0 54 >38   Motor Summary Table   Stim Site NR Onset (ms) Norm Onset (ms) O-P Amp (mV) Norm O-P Amp Site1 Site2 Delta-0 (ms) Dist (cm) Vel (m/s) Norm Vel (m/s)  Right Median Motor (Abd Poll Brev)  31.6C  Wrist    *4.8 <4.2 9.3 >5 Elbow Wrist 4.0 22.2 55 >50  Elbow    8.8  9.0         Right Supra Scap Motor (Supraspinatus)  32.3C  Clavicle    3.5 <3.7 *1.5 >5        Right Ulnar Motor (Abd Dig Min)  31.6C    Wrist    2.3 <4.2 10.2 >3 B Elbow Wrist 3.3 22.0 67 >53  B Elbow    5.6  9.3  A Elbow B Elbow 0.7 11.0 157 >53  A Elbow    6.3  9.6          EMG   Side Muscle Nerve Root Ins Act Fibs Psw Amp Dur Poly Recrt Int Dennie Bible Comment  Right Infraspinatus SupraScap C5-6 Nml Nml Nml Nml Nml 0 Nml Nml   Right Supraspinatus SupraScap C5-6 Nml Nml Nml Nml Nml 0 Nml Nml   Right 1stDorInt Ulnar C8-T1 Nml Nml Nml Nml Nml 0 Nml Nml   Right Abd Poll Brev Median C8-T1 Nml Nml Nml Nml Nml 0 Nml Nml   Right ExtDigCom   Nml Nml Nml Nml Nml 0 Nml Nml   Right Triceps Radial C6-7-8 Nml Nml Nml Nml Nml 0 Nml Nml  Right Deltoid Axillary C5-6 *CRD Nml Nml Nml Nml 0 Nml Nml     Nerve Conduction Studies Anti Sensory Left/Right Comparison   Stim Site L Lat (ms) R Lat (ms) L-R Lat (ms) L Amp (V) R Amp (V) L-R Amp (%) Site1 Site2 L Vel (m/s) R Vel (m/s) L-R Vel (m/s)  Median Acr Palm Anti Sensory (2nd Digit)  33.3C  Wrist  *4.5   19.5  Wrist Palm     Palm             Radial Anti Sensory (Base 1st Digit)  31.5C  Wrist  1.6   28.5  Wrist Base 1st Digit     Ulnar Anti Sensory (5th Digit)  32.4C  Wrist  2.6   27.9  Wrist 5th Digit  54    Motor Left/Right Comparison   Stim Site L Lat (ms) R Lat (ms) L-R Lat (ms) L Amp (mV) R Amp (mV) L-R Amp (%) Site1 Site2 L Vel (m/s) R Vel (m/s) L-R Vel (m/s)  Median Motor (Abd Poll Brev)  31.6C  Wrist  *4.8   9.3  Elbow Wrist  55   Elbow  8.8   9.0        Supra Scap Motor (Supraspinatus)  32.3C  Clavicle  3.5   *1.5        Ulnar Motor (Abd Dig Min)  31.6C  Wrist  2.3   10.2  B Elbow Wrist  67   B Elbow  5.6   9.3  A Elbow B Elbow  157   A Elbow  6.3   9.6              Clinical History: No specialty comments available.  She reports that she has been smoking Cigarettes.  She has a 0.06 pack-year smoking history. She has never used smokeless tobacco.   Recent Labs  12/27/16 1827  HGBA1C 6.4    Objective:  VS:  HT:    WT:   BMI:     BP:   HR: bpm  TEMP: (  )  RESP:  Physical Exam  Musculoskeletal:  Inspection reveals no atrophy of the bilateral trapezius, supraspinatus, deltoid, APB or FDI or hand intrinsics. There is no swelling, color changes, allodynia or dystrophic changes. There are positive trigger points in the levator scapula and trapezius and rhomboids are reproduce some of her shoulder pain. There is 5 out of 5 strength in the bilateral wrist extension, finger abduction and long finger flexion. There seems to be mild weakness with external rotation of the right shoulder compared to left. There is intact sensation to light touch in all dermatomal and peripheral nerve distributions.    Ortho Exam Imaging: No results found.  Past Medical/Family/Surgical/Social History: Medications & Allergies reviewed per EMR Patient Active Problem List   Diagnosis Date Noted  . Acute pain of right shoulder 03/15/2017  . Strain of right trapezius muscle 03/15/2017  . Musculoskeletal pain 03/15/2017  . OSA (obstructive sleep apnea) 08/23/2015  . Sciatica 11/13/2012   History reviewed. No pertinent past medical history. Family History  Problem Relation Age of Onset  . Diabetes Mother   . Hypertension Mother   . Arthritis Mother   . Diabetes Father   . Kidney disease Father   . Heart disease Maternal Grandmother   . Diabetes Paternal Grandmother    Past Surgical History:  Procedure Laterality Date  . HYSTERECTOMY ABDOMINAL WITH SALPINGECTOMY  1997   Social History   Occupational History  .  Not on file.   Social History Main Topics  . Smoking status: Light Tobacco Smoker    Packs/day: 0.12    Years: 0.50    Types: Cigarettes  . Smokeless tobacco: Never Used  . Alcohol use 0.6 oz/week    1 Glasses of wine per week     Comment: 1 glass a year  . Drug use: No  . Sexual activity: No

## 2017-06-13 NOTE — Procedures (Signed)
EMG & NCV Findings: Evaluation of the right median motor nerve showed prolonged distal onset latency (4.8 ms).  The right suprascapular motor nerve showed reduced amplitude (1.5 mV).  The right median (across palm) sensory nerve showed no response (Palm) and prolonged distal peak latency (4.5 ms).  All remaining nerves (as indicated in the following tables) were within normal limits.    Needle evaluation of the right deltoid muscle showed increased insertional activity.  All remaining muscles (as indicated in the following table) showed no evidence of electrical instability.    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 However, clinical correlation is paramount as this does not seem to fit her current symptoms. Also, due to the overlying size of the trapezius it is very hard to do that really good nerve conduction study of the suprascapular nerve with the surface electrode over the supraspinatus muscle. I did complete needle EMG of the supraspinatus and infraspinatus though and these were normal. While I am fairly confident this is not a suprascapular nerve entrapment we cannot completely rule this out but there is no axonal damage seen with the needle EMG.  There was no evidence of cervical radiculopathy although clinically her symptoms fit more with a cervical radiculopathy versus a myofascial pain syndrome with trigger points in the levator scapula and rhomboideus to do reproduce her pain   Recommendations: 1.  Follow-up with referring physician. 2.  Continue current management of symptoms. 3.  Consider cervical MRI if felt necessary and not intrinsic shoulder pathology and patient would likely do well with physical therapy and dry needling.    Nerve Conduction Studies Anti Sensory Summary Table   Stim Site NR Peak (ms) Norm Peak (ms) P-T Amp (V) Norm P-T Amp  Site1 Site2 Delta-P (ms) Dist (cm) Vel (m/s) Norm Vel (m/s)  Right Median Acr Palm Anti Sensory (2nd Digit)  33.3C  Wrist    *4.5 <3.6 19.5 >10 Wrist Palm  0.0    Palm *NR  <2.0          Right Radial Anti Sensory (Base 1st Digit)  31.5C  Wrist    1.6 <3.1 28.5  Wrist Base 1st Digit 1.6 0.0    Right Ulnar Anti Sensory (5th Digit)  32.4C  Wrist    2.6 <3.7 27.9 >15.0 Wrist 5th Digit 2.6 14.0 54 >38   Motor Summary Table   Stim Site NR Onset (ms) Norm Onset (ms) O-P Amp (mV) Norm O-P Amp Site1 Site2 Delta-0 (ms) Dist (cm) Vel (m/s) Norm Vel (m/s)  Right Median Motor (Abd Poll Brev)  31.6C  Wrist    *4.8 <4.2 9.3 >5 Elbow Wrist 4.0 22.2 55 >50  Elbow    8.8  9.0         Right Supra Scap Motor (Supraspinatus)  32.3C  Clavicle    3.5 <3.7 *1.5 >5        Right Ulnar Motor (Abd Dig Min)  31.6C  Wrist    2.3 <4.2 10.2 >3 B Elbow Wrist 3.3 22.0 67 >53  B Elbow    5.6  9.3  A Elbow B Elbow 0.7 11.0 157 >53  A Elbow    6.3  9.6          EMG   Side Muscle Nerve Root Ins Act Fibs Psw Amp Dur Poly Recrt Int Dennie Bible Comment  Right Infraspinatus SupraScap C5-6 Nml Nml Nml  Nml Nml 0 Nml Nml   Right Supraspinatus SupraScap C5-6 Nml Nml Nml Nml Nml 0 Nml Nml   Right 1stDorInt Ulnar C8-T1 Nml Nml Nml Nml Nml 0 Nml Nml   Right Abd Poll Brev Median C8-T1 Nml Nml Nml Nml Nml 0 Nml Nml   Right ExtDigCom   Nml Nml Nml Nml Nml 0 Nml Nml   Right Triceps Radial C6-7-8 Nml Nml Nml Nml Nml 0 Nml Nml   Right Deltoid Axillary C5-6 *CRD Nml Nml Nml Nml 0 Nml Nml     Nerve Conduction Studies Anti Sensory Left/Right Comparison   Stim Site L Lat (ms) R Lat (ms) L-R Lat (ms) L Amp (V) R Amp (V) L-R Amp (%) Site1 Site2 L Vel (m/s) R Vel (m/s) L-R Vel (m/s)  Median Acr Palm Anti Sensory (2nd Digit)  33.3C  Wrist  *4.5   19.5  Wrist Palm     Palm             Radial Anti Sensory (Base 1st Digit)  31.5C  Wrist  1.6   28.5  Wrist Base 1st Digit     Ulnar Anti Sensory (5th Digit)  32.4C  Wrist  2.6   27.9   Wrist 5th Digit  54    Motor Left/Right Comparison   Stim Site L Lat (ms) R Lat (ms) L-R Lat (ms) L Amp (mV) R Amp (mV) L-R Amp (%) Site1 Site2 L Vel (m/s) R Vel (m/s) L-R Vel (m/s)  Median Motor (Abd Poll Brev)  31.6C  Wrist  *4.8   9.3  Elbow Wrist  55   Elbow  8.8   9.0        Supra Scap Motor (Supraspinatus)  32.3C  Clavicle  3.5   *1.5        Ulnar Motor (Abd Dig Min)  31.6C  Wrist  2.3   10.2  B Elbow Wrist  67   B Elbow  5.6   9.3  A Elbow B Elbow  157   A Elbow  6.3   9.6

## 2017-06-21 ENCOUNTER — Encounter (INDEPENDENT_AMBULATORY_CARE_PROVIDER_SITE_OTHER): Payer: Self-pay | Admitting: Orthopedic Surgery

## 2017-06-21 ENCOUNTER — Ambulatory Visit (INDEPENDENT_AMBULATORY_CARE_PROVIDER_SITE_OTHER): Payer: No Typology Code available for payment source | Admitting: Orthopedic Surgery

## 2017-06-21 DIAGNOSIS — G8929 Other chronic pain: Secondary | ICD-10-CM

## 2017-06-21 DIAGNOSIS — M25511 Pain in right shoulder: Secondary | ICD-10-CM | POA: Diagnosis not present

## 2017-06-21 NOTE — Progress Notes (Signed)
Office Visit Note   Patient: Stephanie Chandler           Date of Birth: 01/17/1966           MRN: [id number] Visit Date: 06/21/2017 Requested by: Stephanie Chandler, Stephanie P, MD [address] [place] [id number] PCP: Stephanie Chandler, Stephanie P, MD  Subjective: Chief Complaint  Patient presents with  . Follow-up    EMG/NCV review    HPI: Stephanie AngCathy is a 51 year old patient with shoulder and trapezial pain and rotator cuff weakness.  [facility] EMG nerve study which shows moderate carpal tunnel syndrome but no definitive suprascapular nerve entrapment.  She states she is getting a little bit better.  She is taking less pain medicine at night.  Denies any mechanical symptoms in the shoulder and denies any numbness and tingling.  All of her pain is primarily in the trapezial region.  She denies any numbness and tingling in her right hand.              ROS: All systems reviewed are negative as they relate to the chief complaint within the history of present illness.  Patient denies  fevers or chills.   Assessment & Plan: Visit Diagnoses: No diagnosis found.  Plan: Impression is pretty isolated supraspinatus and infraspinatus weakness.  Ultrasound was performed to try to see if a spinoglenoid notch cyst could be observed.  Could not find one but I still think that the most likely diagnosis.  Her deltoid biceps and triceps and hand strength is normal and symmetric compared to the left.  I think MRI scan is indicated to rule out spinal glenoid notch cyst.  She is in a check with her insurance and see we can get that approved.  I will see her back after that scan  Follow-Up Instructions: Return if symptoms worsen or fail to improve.   Orders:  No orders of the defined types were placed in this encounter.  No orders of the defined types were placed in this encounter.     Procedures: No procedures performed   Clinical Data: No additional findings.  Objective: Vital Signs:  There were no vitals taken for this visit.  Physical Exam:   Constitutional: Patient appears well-developed HEENT:  Head: Normocephalic Eyes:EOM are normal Neck: Normal range of motion Cardiovascular: Normal rate Pulmonary/chest: Effort normal Neurologic: Patient is alert Skin: Skin is warm Psychiatric: Patient has normal mood and affect    Ortho Exam: Orthopedic exam demonstrates full active and passive range of motion of the shoulder and neck.  She has weakness to supraspinatus and infraspinatus testing on the right compared to the left.  Subscap strength is intact.  O'Brien's testing equivocal on the right negative on the left.  No other masses lymph and after skin changes noted in the shoulder region.  Mild trapezial tenderness is present.  Motor sensory function in bilateral hands intact.  No paresthesias C5 T1) left arm  Specialty Comments:  No specialty comments available.  Imaging: No results found.   PMFS History: Patient Active Problem List   Diagnosis Date Noted  . Acute pain of right shoulder 03/15/2017  . Strain of right trapezius muscle 03/15/2017  . Musculoskeletal pain 03/15/2017  . OSA (obstructive sleep apnea) 08/23/2015  . Sciatica 11/13/2012   No past medical history on file.  Family History  Problem Relation Age of Onset  . Diabetes Mother   . Hypertension Mother   . Arthritis Mother   .  Diabetes Father   . Kidney disease Father   . Heart disease Maternal Grandmother   . Diabetes Paternal Grandmother     Past Surgical History:  Procedure Laterality Date  . HYSTERECTOMY ABDOMINAL WITH SALPINGECTOMY  1997   Social History   Occupational History  . Not on file.   Social History Main Topics  . Smoking status: Light Tobacco Smoker    Packs/day: 0.12    Years: 0.50    Types: Cigarettes  . Smokeless tobacco: Never Used  . Alcohol use 0.6 oz/week    1 Glasses of wine per week     Comment: 1 glass a year  . Drug use: No  . Sexual activity:  No

## 2017-06-26 ENCOUNTER — Ambulatory Visit: Payer: Self-pay | Admitting: Registered Nurse

## 2017-06-26 ENCOUNTER — Telehealth (INDEPENDENT_AMBULATORY_CARE_PROVIDER_SITE_OTHER): Payer: Self-pay | Admitting: *Deleted

## 2017-06-26 VITALS — BP 130/93 | HR 76 | Temp 97.6°F

## 2017-06-26 DIAGNOSIS — S63622A Sprain of interphalangeal joint of left thumb, initial encounter: Secondary | ICD-10-CM

## 2017-06-26 DIAGNOSIS — M25511 Pain in right shoulder: Secondary | ICD-10-CM

## 2017-06-26 DIAGNOSIS — M7712 Lateral epicondylitis, left elbow: Secondary | ICD-10-CM

## 2017-06-26 DIAGNOSIS — M25512 Pain in left shoulder: Principal | ICD-10-CM

## 2017-06-26 MED ORDER — CYCLOBENZAPRINE HCL 10 MG PO TABS: 5.0000 mg | ORAL_TABLET | Freq: Three times a day (TID) | ORAL | 0 refills | Status: DC | PRN

## 2017-06-26 MED ORDER — MENTHOL (TOPICAL ANALGESIC) 4 % EX GEL: 1.0000 | Freq: Four times a day (QID) | CUTANEOUS | 0 refills | Status: AC | PRN

## 2017-06-26 NOTE — Telephone Encounter (Signed)
ic sw pt and advised.

## 2017-06-26 NOTE — Telephone Encounter (Signed)
Please advise. Thanks.  

## 2017-06-26 NOTE — Telephone Encounter (Signed)
Received call from pt stating she is scheduled to have her MRI done on shoulder and wanted to know if she can also get an MRI of her back to while she is there, says she was going to bring it up in the next appt but wanted to know if can get done so that she can get it done together. Please advise!  CB # [phone]

## 2017-06-26 NOTE — Patient Instructions (Addendum)
Tennis Elbow Rehab Ask your health care provider which exercises are safe for you. Do exercises exactly as told by your health care provider and adjust them as directed. It is normal to feel mild stretching, pulling, tightness, or discomfort as you do these exercises, but you should stop right away if you feel sudden pain or your pain gets worse. Do not begin these exercises until told by your health care provider. Stretching and range of motion exercises These exercises warm up your muscles and joints and improve the movement and flexibility of your elbow. These exercises also help to relieve pain, numbness, and tingling. Exercise A: Wrist extensor stretch 1. Extend your left / right elbow with your fingers pointing down. 2. Gently pull the palm of your left / right hand toward you until you feel a gentle stretch on the top of your forearm. 3. To increase the stretch, push your left / right hand toward the outer edge or pinkie side of your forearm. 4. Hold this position for __________ seconds. Repeat __________ times. Complete this exercise __________ times a day. If directed by your health care provider, repeat this stretch except do it with a bent elbow this time. Exercise B: Wrist flexor stretch  1. Extend your left / right elbow and turn your palm upward. 2. Gently pull your left / right palm and fingertips back so your wrist extends and your fingers point more toward the ground. 3. You should feel a gentle stretch on the inside of your forearm. 4. Hold this position for __________ seconds. Repeat __________ times. Complete this exercise __________ times a day. If directed by your health care provider, repeat this stretch except do it with a bent elbow this time. Strengthening exercises These exercises build strength and endurance in your elbow. Endurance is the ability to use your muscles for a long time, even after they get tired. Exercise C: Wrist extensors  1. Sit with your left /  right forearm palm-down and fully supported on a table or countertop. Your elbow should be resting below the height of your shoulder. 2. Let your left / right wrist extend over the edge of the surface. 3. Loosely hold a __________ weight or a piece of rubber exercise band or tubing in your left / right hand. Slowly curl your left / right hand up toward your forearm. If you are using band or tubing, hold the band or tubing in place with your other hand to provide resistance. 4. Hold this position for __________ seconds. 5. Slowly return to the starting position. Repeat __________ times. Complete this exercise __________ times a day. Exercise D: Radial deviators  1. Stand with a __________ weight in your left / righthand. Or, sit while holding a rubber exercise band or tubing with your other arm supported on a table or countertop. Position your hand so your thumb is on top. 2. Raise your hand upward in front of you so your thumb travels toward your forearm, or pull up on the rubber tubing. 3. Hold this position for __________ seconds. 4. Slowly return to the starting position. Repeat __________ times. Complete this exercise __________ times a day. Exercise E: Eccentric wrist extensors 1. Sit with your left / right forearm palm-down and fully supported on a table or countertop. Your elbow should be resting below the height of your shoulder. 2. If told by your health care provider, hold a __________ weight in your hand. 3. Let your left / right wrist extend over the edge of   the surface. 4. Use your other hand to lift up your left / right hand toward your forearm. Keep your forearm on the table. 5. Using only the muscles in your left / right hand, slowly lower your hand back down to the starting position. Repeat __________ times. Complete this exercise __________ times a day. This information is not intended to replace advice given to you by your health care provider. Make sure you discuss any  questions you have with your health care provider. Document Released: 11/13/2005 Document Revised: 07/19/2016 Document Reviewed: 08/12/2015 Elsevier Interactive Patient Education  2018 [employer] Wrist and Forearm Exercises Ask your health care provider which exercises are safe for you. Do exercises exactly as told by your health care provider and adjust them as directed. It is normal to feel mild stretching, pulling, tightness, or discomfort as you do these exercises, but you should stop right away if you feel sudden pain or your pain gets worse. Do not begin these exercises until told by your health care provider. RANGE OF MOTION EXERCISES These exercises warm up your muscles and joints and improve the movement and flexibility of your injured wrist and forearm. These exercises also help to relieve pain, numbness, and tingling. These exercises are done using the muscles in your injured wrist and forearm. Exercise A: Wrist Flexion, Active 1. With your fingers relaxed, bend your wrist forward as far as you can. 2. Hold this position for __________ seconds. Repeat __________ times. Complete this exercise __________ times a day. Exercise B: Wrist Extension, Active 1. With your fingers relaxed, bend your wrist backward as far as you can. 2. Hold this position for __________ seconds. Repeat __________ times. Complete this exercise __________ times a day. Exercise C: Supination, Active  1. Stand or sit with your arms at your sides. 2. Bend your left / right elbow to an "L" shape (90 degrees). 3. Turn your palm upward until you feel a gentle stretch on the inside of your forearm. 4. Hold this position for __________ seconds. 5. Slowly return your palm to the starting position. Repeat __________ times. Complete this exercise __________ times a day. Exercise D: Pronation, Active  1. Stand or sit with your arms at your sides. 2. Bend your left / right elbow to an "L" shape (90 degrees). 3. Turn  your palm downward until you feel a gentle stretch on the top of your forearm. 4. Hold this position for __________ seconds. 5. Slowly return your palm to the starting position. Repeat __________ times. Complete this exercise __________ times a day. STRETCHING EXERCISES These exercises warm up your muscles and joints and improve the movement and flexibility of your injured wrist and forearm. These exercises also help to relieve pain, numbness, and tingling. These exercises are done using your healthy wrist and forearm to help stretch the muscles in your injured wrist and forearm. Exercise E: Wrist Flexion, Passive  1. Extend your left / right arm in front of you, relax your wrist, and point your fingers downward. 2. Gently push on the back of your hand. Stop when you feel a gentle stretch on the top of your forearm. 3. Hold this position for __________ seconds. Repeat __________ times. Complete this exercise __________ times a day. Exercise F: Wrist Extension, Passive  1. Extend your left / right arm in front of you and turn your palm upward. 2. Gently pull your palm and fingertips back so your fingers point downward. You should feel a gentle stretch on the palm-side of  your forearm. 3. Hold this position for __________ seconds. Repeat __________ times. Complete this exercise __________ times a day. Exercise G: Forearm Rotation, Supination, Passive 1. Sit with your left / right elbow bent to an "L" shape (90 degrees) with your forearm resting on a table. 2. Keeping your upper body and shoulder still, use your other hand to rotate your forearm palm-up until you feel a gentle to moderate stretch. 3. Hold this position for __________ seconds. 4. Slowly release the stretch and return to the starting position. Repeat __________ times. Complete this exercise __________ times a day. Exercise H: Forearm Rotation, Pronation, Passive 1. Sit with your left / right elbow bent to an "L" shape (90  degrees) with your forearm resting on a table. 2. Keeping your upper body and shoulder still, use your other hand to rotate your forearm palm-down until you feel a gentle to moderate stretch. 3. Hold this position for __________ seconds. 4. Slowly release the stretch and return to the starting position. Repeat __________ times. Complete this exercise __________ times a day. STRENGTHENING EXERCISES These exercises build strength and endurance in your wrist and forearm. Endurance is the ability to use your muscles for a long time, even after they get tired. Exercise I: Wrist Flexors  1. Sit with your left / right forearm supported on a table and your hand resting palm-up over the edge of the table. Your elbow should be bent to an "L" shape (about 90 degrees) and be below the level of your shoulder. 2. Hold a __________ weight in your left / right hand. Or, hold a rubber exercise band or tube in both hands, keeping your hands at the same level and hip distance apart. There should be a slight tension in the exercise band or tube. 3. Slowly curl your hand up toward your forearm. 4. Hold this position for __________ seconds. 5. Slowly lower your hand back to the starting position. Repeat __________ times. Complete this exercise __________ times a day. Exercise J: Wrist Extensors  1. Sit with your left / right forearm supported on a table and your hand resting palm-down over the edge of the table. Your elbow should be bent to an "L" shape (about 90 degrees) and be below the level of your shoulder. 2. Hold a __________ weight in your left / right hand. Or, hold a rubber exercise band or tube in both hands, keeping your hands at the same level and hip distance apart. There should be a slight tension in the exercise band or tube. 3. Slowly curl your hand up toward your forearm. 4. Hold this position for __________ seconds. 5. Slowly lower your hand back to the starting position. Repeat __________ times.  Complete this exercise __________ times a day. Exercise K: Forearm Rotation, Supination  1. Sit with your left / right forearm supported on a table and your hand resting palm-down. Your elbow should be at your side, bent to an "L" shape (about 90 degrees), and below the level of your shoulder. Keep your wrist stable and in a neutral position throughout the exercise. 2. Gently hold a lightweight hammer with your left / right hand. 3. Without moving your elbow or wrist, slowly rotate your palm upward to a thumbs-up position. 4. Hold this position for __________ seconds. 5. Slowly return your forearm to the starting position. Repeat __________ times. Complete this exercise __________ times a day. Exercise L: Forearm Rotation, Pronation  1. Sit with your left / right forearm supported on a table  and your hand resting palm-up. Your elbow should be at your side, bent to an "L" shape (about 90 degrees), and below the level of your shoulder. Keep your wrist stable. Do not allow it to move backward or forward during the exercise. 2. Gently hold a lightweight hammer with your left / right hand. 3. Without moving your elbow or wrist, slowly rotate your palm and hand upward to a thumbs-up position. 4. Hold this position for __________ seconds. 5. Slowly return your forearm to the starting position. Repeat __________ times. Complete this exercise __________ times a day. Exercise M: Grip Strengthening  1. Hold one of these items in your left / right hand: play dough, therapy putty, a dense sponge, a stress ball, or a large, rolled sock. 2. Squeeze as hard as you can without increasing pain. 3. Hold this position for __________ seconds. 4. Slowly release your grip. Repeat __________ times. Complete this exercise __________ times a day. This information is not intended to replace advice given to you by your health care provider. Make sure you discuss any questions you have with your health care  provider. Document Released: 09/27/2005 Document Revised: 08/07/2016 Document Reviewed: 08/08/2015 Elsevier Interactive Patient Education  2018 [employer].  Tennis Elbow Rehab Ask your health care provider which exercises are safe for you. Do exercises exactly as told by your health care provider and adjust them as directed. It is normal to feel mild stretching, pulling, tightness, or discomfort as you do these exercises, but you should stop right away if you feel sudden pain or your pain gets worse. Do not begin these exercises until told by your health care provider. Stretching and range of motion exercises These exercises warm up your muscles and joints and improve the movement and flexibility of your elbow. These exercises also help to relieve pain, numbness, and tingling. Exercise A: Wrist extensor stretch 5. Extend your left / right elbow with your fingers pointing down. 6. Gently pull the palm of your left / right hand toward you until you feel a gentle stretch on the top of your forearm. 7. To increase the stretch, push your left / right hand toward the outer edge or pinkie side of your forearm. 8. Hold this position for __________ seconds. Repeat __________ times. Complete this exercise __________ times a day. If directed by your health care provider, repeat this stretch except do it with a bent elbow this time. Exercise B: Wrist flexor stretch  5. Extend your left / right elbow and turn your palm upward. 6. Gently pull your left / right palm and fingertips back so your wrist extends and your fingers point more toward the ground. 7. You should feel a gentle stretch on the inside of your forearm. 8. Hold this position for __________ seconds. Repeat __________ times. Complete this exercise __________ times a day. If directed by your health care provider, repeat this stretch except do it with a bent elbow this time. Strengthening exercises These exercises build strength and endurance  in your elbow. Endurance is the ability to use your muscles for a long time, even after they get tired. Exercise C: Wrist extensors  6. Sit with your left / right forearm palm-down and fully supported on a table or countertop. Your elbow should be resting below the height of your shoulder. 7. Let your left / right wrist extend over the edge of the surface. 8. Loosely hold a __________ weight or a piece of rubber exercise band or tubing in your left /  right hand. Slowly curl your left / right hand up toward your forearm. If you are using band or tubing, hold the band or tubing in place with your other hand to provide resistance. 9. Hold this position for __________ seconds. 10. Slowly return to the starting position. Repeat __________ times. Complete this exercise __________ times a day. Exercise D: Radial deviators  5. Stand with a __________ weight in your left / righthand. Or, sit while holding a rubber exercise band or tubing with your other arm supported on a table or countertop. Position your hand so your thumb is on top. 6. Raise your hand upward in front of you so your thumb travels toward your forearm, or pull up on the rubber tubing. 7. Hold this position for __________ seconds. 8. Slowly return to the starting position. Repeat __________ times. Complete this exercise __________ times a day. Exercise E: Eccentric wrist extensors 6. Sit with your left / right forearm palm-down and fully supported on a table or countertop. Your elbow should be resting below the height of your shoulder. 7. If told by your health care provider, hold a __________ weight in your hand. 8. Let your left / right wrist extend over the edge of the surface. 9. Use your other hand to lift up your left / right hand toward your forearm. Keep your forearm on the table. 10. Using only the muscles in your left / right hand, slowly lower your hand back down to the starting position. Repeat __________ times. Complete  this exercise __________ times a day. This information is not intended to replace advice given to you by your health care provider. Make sure you discuss any questions you have with your health care provider. Document Released: 11/13/2005 Document Revised: 07/19/2016 Document Reviewed: 08/12/2015 Elsevier Interactive Patient Education  2018 [employer] Thumb Sprain A thumb sprain is an injury to one of the strong bands of tissue (ligaments) that connect the bones in your thumb. The ligament can be stretched too much or it can tear. A tear can be either partial or complete. The severity of the sprain depends on how much of the ligament was damaged or torn. What are the causes? A thumb sprain is often caused by a fall or an accident. If you extend your hands to catch an object or to protect yourself, the force of the impact can cause your ligament to stretch too much. This excess tension can also cause your ligament to tear. What increases the risk? This injury is more likely to occur in people who play:  Sports that involve a greater risk of falling, such as skiing.  Sports that involve catching an object, such as basketball.  What are the signs or symptoms? Symptoms of this condition include:  Loss of motion in your thumb.  Bruising.  Tenderness.  Swelling.  How is this diagnosed? This condition is diagnosed with a medical history and physical exam. You may also have an X-ray of your thumb. How is this treated? Treatment varies depending on the severity of your sprain. If your ligament is overstretched or partially torn, treatment usually involves keeping your thumb in a fixed position (immobilization) for a period of time. To help you do this, your health care provider will apply a bandage, cast, or splint to keep your thumb from moving until it heals. If your ligament is fully torn, you may need surgery to reconnect the ligament to the bone. After surgery, a cast or splint will be  applied and  will need to stay on your thumb while it heals. Your health care provider may also suggest exercises or physical therapy to strengthen your thumb. Follow these instructions at home: If you have a cast:  Do not stick anything inside the cast to scratch your skin. Doing that increases your risk of infection.  Check the skin around the cast every day. Report any concerns to your health care provider. You may put lotion on dry skin around the edges of the cast. Do not apply lotion to the skin underneath the cast.  Keep the cast clean and dry. If you have a splint:  Wear it as directed by your health care provider. Remove it only as directed by your health care provider.  Loosen the splint if your fingers become numb and tingle, or if they turn cold and blue.  Keep the splint clean and dry. Bathing  Cover the bandage, cast, or splint with a watertight plastic bag to protect it from water while you take a bath or a shower. Do not let the bandage, cast, or splint get wet. Managing pain, stiffness, and swelling  If directed, apply ice to the injured area (unless you have a cast): ? Put ice in a plastic bag. ? Place a towel between your skin and the bag. ? Leave the ice on for 20 minutes, 2-3 times per day.  Move your fingers often to avoid stiffness and to lessen swelling.  Raise (elevate) the injured area above the level of your heart while you are sitting or lying down. Driving  Do not drive or operate heavy machinery while taking pain medicine.  Do not drive while wearing a cast or splint on a hand that you use for driving. General instructions  Do not put pressure on any part of your cast or splint until it is fully hardened. This may take several hours.  Take medicines only as directed by your health care provider. These include over-the-counter medicines and prescription medicines.  Keep all follow-up visits as directed by your health care provider. This is  important.  Do any exercise or physical therapy as directed by your health care provider.  Do not wear rings on your injured thumb. Contact a health care provider if:  Your pain is not controlled with medicine.  Your bruising or swelling gets worse.  Your cast or splint is damaged. Get help right away if:  Your thumb is numb or blue.  Your thumb feels colder than normal. This information is not intended to replace advice given to you by your health care provider. Make sure you discuss any questions you have with your health care provider. Document Released: 12/21/2004 Document Revised: 07/16/2016 Document Reviewed: 08/25/2014 Elsevier Interactive Patient Education  2018 [employer] Carpal Tunnel Syndrome Carpal tunnel syndrome is a condition that causes pain in your hand and arm. The carpal tunnel is a narrow area located on the palm side of your wrist. Repeated wrist motion or certain diseases may cause swelling within the tunnel. This swelling pinches the main nerve in the wrist (median nerve). What are the causes? This condition may be caused by:  Repeated wrist motions.  Wrist injuries.  Arthritis.  A cyst or tumor in the carpal tunnel.  Fluid buildup during pregnancy.  Sometimes the cause of this condition is not known. What increases the risk? This condition is more likely to develop in:  People who have jobs that cause them to repeatedly move their wrists in the same motion, such  as Health visitor.  Women.  People with certain conditions, such as: ? Diabetes. ? Obesity. ? An underactive thyroid (hypothyroidism). ? Kidney failure.  What are the signs or symptoms? Symptoms of this condition include:  A tingling feeling in your fingers, especially in your thumb, index, and middle fingers.  Tingling or numbness in your hand.  An aching feeling in your entire arm, especially when your wrist and elbow are bent for long periods of time.  Wrist pain  that goes up your arm to your shoulder.  Pain that goes down into your palm or fingers.  A weak feeling in your hands. You may have trouble grabbing and holding items.  Your symptoms may feel worse during the night. How is this diagnosed? This condition is diagnosed with a medical history and physical exam. You may also have tests, including:  An electromyogram (EMG). This test measures electrical signals sent by your nerves into the muscles.  X-rays.  How is this treated? Treatment for this condition includes:  Lifestyle changes. It is important to stop doing or modify the activity that caused your condition.  Physical or occupational therapy.  Medicines for pain and inflammation. This may include medicine that is injected into your wrist.  A wrist splint.  Surgery.  Follow these instructions at home: If you have a splint:  Wear it as told by your health care provider. Remove it only as told by your health care provider.  Loosen the splint if your fingers become numb and tingle, or if they turn cold and blue.  Keep the splint clean and dry. General instructions  Take over-the-counter and prescription medicines only as told by your health care provider.  Rest your wrist from any activity that may be causing your pain. If your condition is work related, talk to your employer about changes that can be made, such as getting a wrist pad to use while typing.  If directed, apply ice to the painful area: ? Put ice in a plastic bag. ? Place a towel between your skin and the bag. ? Leave the ice on for 20 minutes, 2-3 times per day.  Keep all follow-up visits as told by your health care provider. This is important.  Do any exercises as told by your health care provider, physical therapist, or occupational therapist. Contact a health care provider if:  You have new symptoms.  Your pain is not controlled with medicines.  Your symptoms get worse. This information is not  intended to replace advice given to you by your health care provider. Make sure you discuss any questions you have with your health care provider. Document Released: 11/10/2000 Document Revised: 03/23/2016 Document Reviewed: 03/31/2015 Elsevier Interactive Patient Education  2017 [employer].

## 2017-06-26 NOTE — Telephone Encounter (Signed)
Hard to do with no record of back issues - it is not a 2 for 1 deal pls call thx

## 2017-06-26 NOTE — Progress Notes (Signed)
Subjective:    Patient ID: Stephanie Chandler, female    DOB: 03-02-1966, 51 y.o.   MRN: [id number]  51y/o established african [place] female patient reports L thumb pain extending in to lateral wrist x1 week. No known injury. Thought she may have worsened it over a few days lifting light weights in gym, but it has progressed over last 3 days, she has begun having numbness and tingling in L FA, hand and all fingers. Feels weaker holding objects due to pain. Has not dropped anything at home or work.  Denied falls/trauma. Upper back/neck pain has been going on for a couple months used to be only right side but now left upper also. Right hand dominant and due to neck/shoulder pain treated with motrin, biofreeze and flexeril has been seeing orthopedist MRI pending and she has been changing how she holds items at work using left hand more now.  biofreeze topical prn neck/shoulders.  Has not had rash or swelling.  Orthopedics told her she may have cyst on right side.  US and nerve conduction study normal per patient.  Typically eats meat 1 meal per day on multivitamin.  Denied swelling,rash.  Requested refill of flexeril.      Review of Systems  Constitutional: Negative for activity change, appetite change, chills, diaphoresis, fatigue and fever.  HENT: Negative for congestion, trouble swallowing and voice change.   Eyes: Negative for pain, discharge and visual disturbance.  Respiratory: Negative for cough and wheezing.   Cardiovascular: Negative for chest pain and leg swelling.  Gastrointestinal: Negative for abdominal pain, blood in stool, constipation, diarrhea, nausea and vomiting.  Endocrine: Negative for cold intolerance and heat intolerance.  Genitourinary: Negative for difficulty urinating, dysuria and hematuria.  Musculoskeletal: Positive for arthralgias, back pain, myalgias and neck pain. Negative for gait problem, joint swelling and neck stiffness.  Skin: Negative for color change, pallor,  rash and wound.  Allergic/Immunologic: Negative for environmental allergies and food allergies.  Neurological: Positive for weakness and numbness. Negative for dizziness, tremors, seizures, syncope, facial asymmetry, speech difficulty, light-headedness and headaches.  Hematological: Negative for adenopathy. Does not bruise/bleed easily.  Psychiatric/Behavioral: Negative for agitation, confusion and sleep disturbance. The patient is not nervous/anxious.        Objective:   Physical Exam  Constitutional: She is oriented to person, place, and time. Vital signs are normal. She appears well-developed and well-nourished. She is active and cooperative. She is easily aroused.  Non-toxic appearance. She does not have a sickly appearance. She does not appear ill. No distress.  HENT:  Head: Normocephalic and atraumatic.  Right Ear: Hearing and external ear normal.  Left Ear: Hearing and external ear normal.  Nose: Nose normal.  Mouth/Throat: Uvula is midline, oropharynx is clear and moist and mucous membranes are normal. No oropharyngeal exudate.  Eyes: Pupils are equal, round, and reactive to light. Conjunctivae, EOM and lids are normal. Right eye exhibits no discharge. Left eye exhibits no discharge. No scleral icterus.  Neck: Normal range of motion and phonation normal. Neck supple. Muscular tenderness present. No tracheal tenderness and no spinous process tenderness present. No neck rigidity. No tracheal deviation, no edema, no erythema and normal range of motion present. No thyromegaly present.    Cardiovascular: Normal rate, regular rhythm, normal heart sounds and intact distal pulses.  Exam reveals no gallop and no friction rub.   No murmur heard. Pulses:      Radial pulses are 2+ on the right side, and 2+ on the left  side.  Pulmonary/Chest: Effort normal and breath sounds normal. No accessory muscle usage or stridor. No respiratory distress. She has no wheezes.  Abdominal: Soft. She exhibits  no distension. There is no guarding.  Musculoskeletal: Normal range of motion. She exhibits tenderness. She exhibits no edema or deformity.       Right shoulder: She exhibits tenderness, pain and spasm. She exhibits normal range of motion, no bony tenderness, no swelling, no effusion, no crepitus, no deformity, no laceration, normal pulse and normal strength.       Left shoulder: She exhibits tenderness, pain and spasm. She exhibits normal range of motion, no bony tenderness, no swelling, no effusion, no crepitus, no deformity, no laceration, normal pulse and normal strength.       Right elbow: Normal.      Left elbow: She exhibits normal range of motion, no swelling, no effusion, no deformity and no laceration. Tenderness found. Lateral epicondyle tenderness noted. No radial head, no medial epicondyle and no olecranon process tenderness noted.       Right wrist: Normal.       Left wrist: She exhibits tenderness. She exhibits normal range of motion, no bony tenderness, no swelling, no effusion, no crepitus, no deformity and no laceration.       Right hip: Normal.       Left hip: Normal.       Right knee: Normal.       Left knee: Normal.       Cervical back: She exhibits tenderness, pain and spasm. She exhibits normal range of motion, no bony tenderness, no swelling, no edema, no deformity, no laceration and normal pulse.       Thoracic back: Normal.       Lumbar back: Normal.       Right upper arm: Normal.       Left upper arm: Normal.       Right forearm: Normal.       Left forearm: Normal.       Arms:      Right hand: Normal.       Left hand: She exhibits tenderness. She exhibits normal range of motion, no bony tenderness, normal two-point discrimination, normal capillary refill, no deformity, no laceration and no swelling. Normal sensation noted. Decreased sensation is not present in the ulnar distribution, is not present in the medial redistribution and is not present in the radial  distribution. Normal strength noted. She exhibits no finger abduction, no thumb/finger opposition and no wrist extension trouble.  A ok sign all fingers without difficulty strength 5/5; arom bilateral wrists full but pain with left flexion/extension/pronation; lateral epicondyle TTP soft tissue; thenar prominance left thumb TTP and pain with flexion/extension; fitted and distributed thumb spica splint left and lateral epicondylitis strap left to patient from clinic stock; bilateral biceps tendons not TTP; biceps/triceps not TTP; superior scapular ridge and central scapular muscles bilaterally TTP along with cervical paraspinals/trapezius muscles tight; negative atchley scratch/empty beer can/neers/internal/external rotation pain  Lymphadenopathy:       Head (right side): No submental, no submandibular, no tonsillar, no preauricular, no posterior auricular and no occipital adenopathy present.       Head (left side): No submental, no submandibular, no tonsillar, no preauricular, no posterior auricular and no occipital adenopathy present.    She has no cervical adenopathy.       Right cervical: No superficial cervical, no deep cervical and no posterior cervical adenopathy present.      Left cervical: No  superficial cervical, no deep cervical and no posterior cervical adenopathy present.    She has no axillary adenopathy.       Right axillary: No pectoral and no lateral adenopathy present.       Left axillary: No pectoral and no lateral adenopathy present. Neurological: She is alert, oriented to person, place, and time and easily aroused. She has normal strength. She is not disoriented. She displays no atrophy, no tremor and normal reflexes. No cranial nerve deficit or sensory deficit. She exhibits normal muscle tone. She displays no seizure activity. Coordination and gait normal. GCS eye subscore is 4. GCS verbal subscore is 5. GCS motor subscore is 6.  Reflex Scores:      Brachioradialis reflexes are 2+  on the right side and 2+ on the left side. Strength 5/5 hands/arms/legs; on/off exam table; in/out of chair without difficulty; gait sure and steady in hall  Skin: Skin is warm, dry and intact. No rash noted. She is not diaphoretic. No erythema. No pallor.  Psychiatric: She has a normal mood and affect. Her speech is normal and behavior is normal. Judgment and thought content normal. Cognition and memory are normal.  Nursing note and vitals reviewed.         Assessment & Plan:  A-pain in both shoulder joints, lateral epicondylitis left and sprain left thumb  P-Wear left epicondylitis strap x [redacted] weeks along with left wrist/thumb spica splint.  Ensure wearing thumb/wrist splint when sleeping.  Discussed symptoms carpal tunnel with patient.  Given biofreeze 4 UD from clinic stock for home/work use.  Cryotherapy TID 15 minutes.  Gentle arom thumb/wrist when take off splints to shower.  Keep MRI and follow up with Orthopedics  Refilled flexeril 10mg  take 1/2 to 1 tab po qhs prn muscle spasms #30 RF0 dispensed from [facility] avoid alcohol/driving after taking medication can cause drowsiness.  Discussed with patient and demonstrated exercises in clinic.  Patient given [employer] handout on elbow tendonitis lateral with rehab exercises, wrist and forearm exercises, thumb sprain and carpal tunnel.  Repetitive motion suspected.  Check ergonomics at work desk.  Discussed rest, nsaid x 2 weeks, stretching, lateral epicondylitis strap wear  Consider PT referral from orthopedics/PCM.  Discussed with patient takes longer to resolve when ongoing for months consider orthopedics and steroid injection if no relief with NSAID, rest, ice, strap.  Discussed motrin and naproxen can counteract her blood pressure medication if headache, hand/ankle/feet swelling, visual changes to stop motrin.  Hydrate, hydrate, hydrate, avoid dehydration when taking NSAID. Option tylenol 1000mg  po QID prn pain OTC Follow up with PCM if no improvement  in symptoms with plan of care.    Patient verbalized understanding of instructions, agreed with plan of care and had no further questions at this time.

## 2017-07-10 ENCOUNTER — Ambulatory Visit
Admission: RE | Admit: 2017-07-10 | Discharge: 2017-07-10 | Disposition: A | Discharge status: Home / Self Care | Payer: No Typology Code available for payment source | Source: Ambulatory Visit | Attending: Orthopedic Surgery | Admitting: Orthopedic Surgery

## 2017-07-10 DIAGNOSIS — G8929 Other chronic pain: Secondary | ICD-10-CM

## 2017-07-10 DIAGNOSIS — M25511 Pain in right shoulder: Principal | ICD-10-CM

## 2017-07-10 MED ORDER — IOPAMIDOL (ISOVUE-M 200) INJECTION 41%
15.0000 mL | Freq: Once | INTRAMUSCULAR | Status: AC
  Administered 2017-07-10: 15 mL via INTRA_ARTICULAR

## 2017-07-26 ENCOUNTER — Ambulatory Visit (INDEPENDENT_AMBULATORY_CARE_PROVIDER_SITE_OTHER): Payer: No Typology Code available for payment source | Admitting: Orthopedic Surgery

## 2017-07-26 ENCOUNTER — Encounter (INDEPENDENT_AMBULATORY_CARE_PROVIDER_SITE_OTHER): Payer: Self-pay | Admitting: Orthopedic Surgery

## 2017-07-26 DIAGNOSIS — M75121 Complete rotator cuff tear or rupture of right shoulder, not specified as traumatic: Secondary | ICD-10-CM

## 2017-07-29 NOTE — Progress Notes (Signed)
Office Visit Note   Patient: Stephanie Chandler           Date of Birth: Sep 26, 1966           MRN: [id number] Visit Date: 07/26/2017 Requested by: Hal Morales, MD [address] [place], [place] [address] PCP: Hal Morales, MD  Subjective: Chief Complaint  Patient presents with  . Right Shoulder - Pain    HPI: Stephanie Chandler is a 51 year old patient with right shoulder pain.  Since I've seen her she has had an MRI scan.  She has supraspinatus tear as well as proximal subscapularis tear and some slipping of the biceps tendon.  She works in Location manager.  She has had 3 months of symptoms.  Denies any history of injury.  Does report daily symptoms of pain and weakness.              ROS: All systems reviewed are negative as they relate to the chief complaint within the history of present illness.  Patient denies  fevers or chills.   Assessment & Plan: Visit Diagnoses: No diagnosis found.  Plan: Impression is right shoulder pain with supraspinatus and subscapularis tear with some slipping of the biceps tendon.  Plan is arthroscopy with subacromial decompression biceps tendon release and tenodesis as well as mini open supraspinatus and subscapularis repair.  MRI scan is reviewed with the patient.  Extensive nature the rehabilitative process discussed.  Patient will need CPM or special abduction brace postoperatively.  All questions answered.  Follow-Up Instructions: No Follow-up on file.   Orders:  No orders of the defined types were placed in this encounter.  No orders of the defined types were placed in this encounter.     Procedures: No procedures performed   Clinical Data: No additional findings.  Objective: Vital Signs: There were no vitals taken for this visit.  Physical Exam:   Constitutional: Patient appears well-developed HEENT:  Head: Normocephalic Eyes:EOM are normal Neck: Normal range of motion Cardiovascular: Normal rate Pulmonary/chest:  Effort normal Neurologic: Patient is alert Skin: Skin is warm Psychiatric: Patient has normal mood and affect    Ortho Exam: Orthopedic exam demonstrates some weakness to supraspinatus testing on the right compared to the left.  She has no restriction of passive range of motion on the right-hand side.  Does have a little bit of course grinding and crepitus with internal/external rotation of the right arm.  No other masses lymph adenopathy or skin changes noted in the right shoulder girdle region.  Neck range of motion is full.  Specialty Comments:  No specialty comments available.  Imaging: No results found.   PMFS History: Patient Active Problem List   Diagnosis Date Noted  . Acute pain of right shoulder 03/15/2017  . Strain of right trapezius muscle 03/15/2017  . Musculoskeletal pain 03/15/2017  . OSA (obstructive sleep apnea) 08/23/2015  . Sciatica 11/13/2012   No past medical history on file.  Family History  Problem Relation Age of Onset  . Diabetes Mother   . Hypertension Mother   . Arthritis Mother   . Diabetes Father   . Kidney disease Father   . Heart disease Maternal Grandmother   . Diabetes Paternal Grandmother     Past Surgical History:  Procedure Laterality Date  . HYSTERECTOMY ABDOMINAL WITH SALPINGECTOMY  1997   Social History   Occupational History  . Not on file.   Social History Main Topics  . Smoking status: Light Tobacco Smoker  Packs/day: 0.12    Years: 0.50    Types: Cigarettes  . Smokeless tobacco: Never Used  . Alcohol use 0.6 oz/week    1 Glasses of wine per week     Comment: 1 glass a year  . Drug use: No  . Sexual activity: No

## 2017-07-31 ENCOUNTER — Ambulatory Visit: Payer: Self-pay | Admitting: Registered Nurse

## 2017-07-31 VITALS — BP 124/95 | HR 75 | Temp 98.2°F

## 2017-07-31 DIAGNOSIS — M25511 Pain in right shoulder: Secondary | ICD-10-CM

## 2017-07-31 DIAGNOSIS — M62838 Other muscle spasm: Secondary | ICD-10-CM

## 2017-07-31 MED ORDER — MENTHOL (TOPICAL ANALGESIC) 4 % EX GEL: 1.0000 "application " | Freq: Four times a day (QID) | CUTANEOUS | 0 refills | Status: AC | PRN

## 2017-07-31 MED ORDER — IBUPROFEN 800 MG PO TABS: 800.0000 mg | ORAL_TABLET | Freq: Three times a day (TID) | ORAL | 0 refills | Status: AC | PRN

## 2017-07-31 NOTE — Progress Notes (Signed)
Subjective:    Patient ID: Stephanie Chandler, female    DOB: 1966/03/26, 51 y.o.   MRN: [id number]  51y/o established african american female Pt reports R shoulder and neck pain. Has been treated for same pain in the past but flares intermittently. This flare began 2 days ago. Does not recall any activity that could have caused it. Has been using tylenol and flexeril at home. Reports she was informed she has a torn rotator cuff last week and is scheduling surgery planned for after the holidays this year with orthopedics.  Had injection mid August and that had helped pain needs refill flexeril and wondering if she should try something else than diclofenac as making her constipated but it does help. Doesn't take it every day last Rx May 2018 filled 75mg  po BID prn 60 tabs from [facility]. Has been using left hand mostly for work but right hand dominant.  Hasn't tried heat/ice/stretches/thermacare.  Also would like more biofreeze gel.  Denied weakness, numbness, dropping things.  Just tight neck muscles and posterior right shoulder pain when using arm to lift items.      Review of Systems  Constitutional: Negative for activity change, appetite change, chills, diaphoresis, fatigue, fever and unexpected weight change.  HENT: Negative for congestion, dental problem, drooling, ear discharge, ear pain, facial swelling, hearing loss, mouth sores, nosebleeds, postnasal drip, rhinorrhea, sinus pain, sinus pressure, sneezing, sore throat, tinnitus, trouble swallowing and voice change.   Eyes: Negative for photophobia, pain, discharge, redness, itching and visual disturbance.  Respiratory: Negative for cough, choking, chest tightness, shortness of breath, wheezing and stridor.   Cardiovascular: Negative for chest pain, palpitations and leg swelling.  Gastrointestinal: Negative for abdominal distention, abdominal pain, blood in stool, constipation, diarrhea, nausea and vomiting.  Endocrine: Negative for cold intolerance  and heat intolerance.  Genitourinary: Negative for difficulty urinating, dysuria and hematuria.  Musculoskeletal: Positive for arthralgias, myalgias and neck pain. Negative for back pain, gait problem, joint swelling and neck stiffness.  Skin: Negative for color change, pallor, rash and wound.  Allergic/Immunologic: Positive for environmental allergies. Negative for food allergies.  Neurological: Negative for dizziness, tremors, seizures, syncope, facial asymmetry, speech difficulty, weakness, light-headedness, numbness and headaches.  Hematological: Negative for adenopathy. Does not bruise/bleed easily.  Psychiatric/Behavioral: Positive for sleep disturbance. Negative for agitation, behavioral problems and confusion.       Objective:   Physical Exam  Constitutional: She is oriented to person, place, and time. She appears well-developed and well-nourished. She is active and cooperative.  Non-toxic appearance. She does not have a sickly appearance. She does not appear ill. No distress.  HENT:  Head: Normocephalic and atraumatic.  Right Ear: Hearing and external ear normal.  Left Ear: Hearing and external ear normal.  Nose: Nose normal. No mucosal edema, rhinorrhea, nose lacerations, nasal deformity, septal deviation or nasal septal hematoma. No epistaxis.  No foreign bodies.  Mouth/Throat: Uvula is midline, oropharynx is clear and moist and mucous membranes are normal. Mucous membranes are not pale, not dry and not cyanotic. She does not have dentures. No oral lesions. No trismus in the jaw. Normal dentition. No dental abscesses, uvula swelling, lacerations or dental caries. No oropharyngeal exudate, posterior oropharyngeal edema, posterior oropharyngeal erythema or tonsillar abscesses.  Eyes: Pupils are equal, round, and reactive to light. Conjunctivae, EOM and lids are normal. Right eye exhibits no chemosis, no discharge, no exudate and no hordeolum. No foreign body present in the right eye.  Left eye exhibits no chemosis, no  discharge, no exudate and no hordeolum. No foreign body present in the left eye. Right conjunctiva is not injected. Right conjunctiva has no hemorrhage. Left conjunctiva is not injected. Left conjunctiva has no hemorrhage. No scleral icterus. Right eye exhibits normal extraocular motion and no nystagmus. Left eye exhibits normal extraocular motion and no nystagmus. Right pupil is round and reactive. Left pupil is round and reactive. Pupils are equal.  Neck: Trachea normal, normal range of motion and phonation normal. Neck supple. Muscular tenderness present. No tracheal tenderness and no spinous process tenderness present. No neck rigidity. No tracheal deviation, no edema, no erythema and normal range of motion present. No thyroid mass and no thyromegaly present.    Bilateral trapezius muscles tight and TTP; suprascapular ridge soft tissue TTP right  Cardiovascular: Normal rate, regular rhythm, S1 normal, S2 normal, normal heart sounds and intact distal pulses.  PMI is not displaced.  Exam reveals no gallop and no friction rub.   No murmur heard. Pulses:      Radial pulses are 2+ on the right side, and 2+ on the left side.  Pulmonary/Chest: Effort normal and breath sounds normal. No accessory muscle usage or stridor. No respiratory distress. She has no decreased breath sounds. She has no wheezes. She has no rhonchi. She has no rales. She exhibits no tenderness.  Abdominal: Normal appearance. She exhibits no distension, no fluid wave and no ascites. There is no rigidity and no guarding.  Musculoskeletal: Normal range of motion. She exhibits tenderness. She exhibits no deformity.       Right shoulder: She exhibits tenderness, pain and spasm. She exhibits normal range of motion, no bony tenderness, no swelling, no effusion, no crepitus, no deformity, no laceration, normal pulse and normal strength.       Left shoulder: Normal.       Right elbow: Normal.      Left elbow:  Normal.       Right wrist: Normal.       Left wrist: Normal.       Right hip: Normal.       Left hip: Normal.       Right knee: Normal.       Left knee: Normal.       Cervical back: She exhibits tenderness, pain and spasm. She exhibits normal range of motion, no bony tenderness, no swelling, no edema, no deformity, no laceration and normal pulse.       Thoracic back: Normal.       Lumbar back: Normal.       Back:       Right upper arm: Normal.       Left upper arm: Normal.       Right forearm: Normal.       Left forearm: Normal.       Arms:      Right hand: Normal.       Left hand: Normal.  Lymphadenopathy:       Head (right side): No submental, no submandibular, no tonsillar, no preauricular, no posterior auricular and no occipital adenopathy present.       Head (left side): No submental, no submandibular, no tonsillar, no preauricular, no posterior auricular and no occipital adenopathy present.    She has no cervical adenopathy.       Right cervical: No superficial cervical, no deep cervical and no posterior cervical adenopathy present.      Left cervical: No superficial cervical, no deep cervical and no posterior cervical adenopathy  present.  Neurological: She is alert and oriented to person, place, and time. She has normal strength. She is not disoriented. She displays no atrophy, no tremor and normal reflexes. No cranial nerve deficit or sensory deficit. She exhibits normal muscle tone. She displays no seizure activity. Coordination and gait normal. GCS eye subscore is 4. GCS verbal subscore is 5. GCS motor subscore is 6.  Reflex Scores:      Brachioradialis reflexes are 2+ on the right side and 2+ on the left side.      Patellar reflexes are 2+ on the right side and 2+ on the left side. Bilateral hand grasp equal 5/5; pain with abduction against resistance right shoulder; on/off exam table; in/out of chair without difficulty gait sure and steady in hall  Skin: Skin is warm, dry  and intact. No abrasion, no bruising, no burn, no ecchymosis, no laceration, no lesion, no petechiae and no rash noted. She is not diaphoretic. No cyanosis or erythema. No pallor. Nails show no clubbing.  Psychiatric: She has a normal mood and affect. Her speech is normal and behavior is normal. Judgment and thought content normal. Cognition and memory are normal.  Nursing note and vitals reviewed.    Discussed orthopedic MRI arthrogram and xray results with patient at appt and given printout per patient request.  Discussed acute tears and degenerative changes noted on reports. CLINICAL DATA:  Shoulder pain, weakness and limited range of motion for 3 months. No specific injury.  EXAM: MR ARTHROGRAM OF THE right SHOULDER  TECHNIQUE: Multiplanar, multisequence MR imaging of the right shoulder was performed following the administration of intra-articular contrast.  CONTRAST:  See Injection Documentation.  COMPARISON:  Right shoulder radiographs 04/26/2017  FINDINGS: Rotator cuff: Large full-thickness retracted rotator cuff tear involving the supraspinatus and subscapularis tendons. Maximum retraction of the supraspinatus tendon is 26 mm.  There is also a large and fairly extensive articular surface tear involving the infraspinatus tendon. Maximum laminar retraction of the articular fibers is 22 mm.  Muscles: Fatty atrophy of the supraspinatus and subscapularis muscles.  Biceps long head: Intact. Moderate tendinopathy involving the intra-articular portion.  Acromioclavicular Joint: Mild AC joint degenerative changes. The acromion is type 1- 2 in shape. There is mild lateral downsloping and mild undersurface spurring.  Glenohumeral Joint: Mild glenohumeral joint degenerative changes with mild degenerative chondrosis and early spurring.  Labrum: Small superior labral tear. The anterior and posterior labrum are intact. The glenohumeral ligaments are intact.  The anterior labrocapsular complex is intact on the ABER sequence.  Bones: No acute bony findings.  IMPRESSION: 1. Large full-thickness retracted rotator cuff tear involving the supraspinatus and subscapularis tendons. 2. Fairly extensive partial thickness articular surface tear involving the infraspinatus tendon. 3. Moderate tendinopathy involving the intra-articular portion of the long head biceps tendon. 4. Small superior labral tear.   Electronically Signed   By: Rudie MeyerP.  Gallerani M.D.   On: 07/10/2017 20:44  XR right shoulder 04/26/2017 AP lateral and outlet view right shoulder reviewed shows normal visualized  lung fields. Normal glenohumeral joint. Normal acromioclavicular joint.  No evidence of trauma or fracture. No other soft tissue calcifications.  Normal shoulder-.there is some fragmentation of the lateral aspect of the  acromion.    Assessment & Plan:  A-right shoulder pain  P-applied thermacare patch to c-spine and patient given another for use tomorrow at work due to clinic closed from clinic stock.  Discussed with patient stop diclofenac 75mg  po BID prn pain at home and trial motrin  800mg  po TID prn pain take with food #30 RF0 dispensed from [facility].  Other alternative would be naproxen 500mg  po BID prn pain with food.  Tylenol 1000mg  po QID prn pain typically better for arthritis may alternate with nsaid.  Do not take more than 1 NSAID in a 24 hour period eg diclofenac, advil, aleve, motrin, ibuprofen, naproxen.  Discussed with patient that local anesthetic was injected for arthrogram and that could have given her relief and as wore off increased pain after ortho appt.  Given biofreeze 6 UD from clinic stock for home/work use.  Cryotherapy TID 15 minutes has ice pack at home.  Keep follow up with Orthopedics  Refilled flexeril 10mg  take 1/2 to 1 tab po qhs prn muscle spasms #30 RF0 dispensed from [facility] avoid alcohol/driving after taking medication can cause drowsiness.   Patient given [employer] handout on biceps tendonitis with rehab exercises, rotator cuff tear and muscle spasms.  Check ergonomics at work desk.  Discussed rest, nsaid x 2 weeks, stretching. Consider PT referral from orthopedics/PCM.  Discussed with patient takes longer to resolve when ongoing for months consider orthopedics and steroid injection if no relief with NSAID, rest, ice, strap.  Discussed motrin and naproxen can counteract her blood pressure medication if headache, hand/ankle/feet swelling, visual changes to stop motrin.  Hydrate, hydrate, hydrate, avoid dehydration when taking NSAID. Option tylenol 1000mg  po QID prn pain OTC Follow up with PCM if no improvement in symptoms with plan of care.    Patient verbalized understanding of instructions, agreed with plan of care and had no further questions at this time.

## 2017-07-31 NOTE — Patient Instructions (Addendum)
Biceps Tendon Tendinitis (Distal) Rehab Ask your health care provider which exercises are safe for you. Do exercises exactly as told by your health care provider and adjust them as directed. It is normal to feel mild stretching, pulling, tightness, or discomfort as you do these exercises, but you should stop right away if you feel sudden pain or your pain gets worse.Do not begin these exercises until told by your health care provider. Stretching and range of motion exercises These exercises warm up your muscles and joints and improve the movement and flexibility of your arm. These exercises can also help to relieve pain and stiffness. Exercise A: Supination, active-assisted 1. Stand or sit with your left / right elbow bent to an "L" shape (90 degrees). 2. Rotate your palm up until you cannot rotate it anymore. Then, use your other hand to help rotate your left / right forearm more. 3. Hold this position for __________ seconds. 4. Slowly return to the starting position. Repeat __________ times. Complete this exercise __________ times a day. Exercise B: Pronation, active-assisted  1. Stand or sit with your left / right elbow bent to an "L" shape (90 degrees). 2. Rotate your left / right palm down until you cannot rotate it anymore. Then, use your other hand to help rotate your left / right forearm more. 3. Hold this position for __________ seconds. 4. Slowly return to the starting position. Repeat __________ times. Complete this exercise __________ times a day. Strengthening exercises These exercises build strength and endurance in your arm and shoulder. Endurance is the ability to use your muscles for a long time, even after your muscles get tired. Exercise C: Supination  1. Sit with your left / right forearm supported on a table. Your elbow should be at waist height. 2. Rest your hand over the edge of the table, palm-down. 3. Gently grasp a lightweight hammer near the head. As this exercise  gets easier for you, try holding the hammer farther down the handle. 4. Without moving your elbow, slowly rotate your palm up so it faces the ceiling. 5. Hold this position for__________ seconds. 6. Slowly return to the starting position. Repeat __________ times. Complete this exercise __________ times a day. Exercise D: Pronation  1. Sit with your left / right forearm supported on a table. Your elbow should be at waist height. 2. Rest your hand over the edge of the table, palm-up. 3. Gently grasp a lightweight hammer near the head. As this exercise gets easier for you, try holding the hammer farther down the handle. 4. Without moving your elbow, slowly rotate your palm down. 5. Hold this position for __________ seconds. 6. Slowly return to the starting position. Repeat __________ times. Complete this exercise __________ times a day. Exercise E: Elbow flexion, supinated  1. Sit on a stable chair without armrests, or stand. 2. If directed, hold a __________ weight in your left / right hand, or hold an exercise band with both hands. Your palms should face up toward the ceiling at the starting position. 3. Bend your left / right elbow and move your hand up toward your shoulder. Keep your other arm straight down, in the starting position. 4. Slowly return to the starting position. Repeat __________ times. Complete this exercise __________ times a day. Exercise F: Elbow extension  1. Lie on your back. 2. Hold a __________ weight in your left / right hand. 3. Bend your left / right elbow to an "L" shape (90 degrees) so your elbow is   pointed up to the ceiling and the weight is overhead. 4. Straighten your elbow, raising your hand toward the ceiling. Use your other hand to support your left / right upper arm and to keep it still. 5. Slowly return to the starting position. Repeat __________ times. Complete this exercise __________ times a day. This information is not intended to replace advice  given to you by your health care provider. Make sure you discuss any questions you have with your health care provider. Document Released: 11/13/2005 Document Revised: 07/20/2016 Document Reviewed: 10/22/2015 Elsevier Interactive Patient Education  2018 [employer]. Muscle Cramps and Spasms Muscle cramps and spasms occur when a muscle or muscles tighten and you have no control over this tightening (involuntary muscle contraction). They are a common problem and can develop in any muscle. The most common place is in the calf muscles of the leg. Muscle cramps and muscle spasms are both involuntary muscle contractions, but there are some differences between the two:  Muscle cramps are painful. They come and go and may last a few seconds to 15 minutes. Muscle cramps are often more forceful and last longer than muscle spasms.  Muscle spasms may or may not be painful. They may also last just a few seconds or much longer.  Certain medical conditions, such as diabetes or Parkinson disease, can make it more likely to develop cramps or spasms. However, cramps or spasms are usually not caused by a serious underlying problem. Common causes include:  Overexertion.  Overuse from repetitive motions, or doing the same thing over and over.  Remaining in a certain position for a long period of time.  Improper preparation, form, or technique while playing a sport or doing an activity.  Dehydration.  Injury.  Side effects of some medicines.  Abnormally low levels of the salts and ions in your blood (electrolytes), especially potassium and calcium. This could happen if you are taking water pills (diuretics) or if you are pregnant.  In many cases, the cause of muscle cramps or spasms is unknown. Follow these instructions at home:  Stay well hydrated. Drink enough fluid to keep your urine clear or pale yellow.  Try massaging, stretching, and relaxing the affected muscle.  If directed, apply heat to  tight or tense muscles as often as told by your health care provider. Use the heat source that your health care provider recommends, such as a moist heat pack or a heating pad. ? Place a towel between your skin and the heat source. ? Leave the heat on for 20-30 minutes. ? Remove the heat if your skin turns bright red. This is especially important if you are unable to feel pain, heat, or cold. You may have a greater risk of getting burned.  If directed, put ice on the affected area. This may help if you are sore or have pain after a cramp or spasm. ? Put ice in a plastic bag. ? Place a towel between your skin and the bag. ? Leavethe ice on for 20 minutes, 2-3 times a day.  Take over-the-counter and prescription medicines only as told by your health care provider.  Pay attention to any changes in your symptoms. Contact a health care provider if:  Your cramps or spasms get more severe or happen more often.  Your cramps or spasms do not improve over time. This information is not intended to replace advice given to you by your health care provider. Make sure you discuss any questions you have with your  health care provider. Document Released: 05/05/2002 Document Revised: 12/15/2015 Document Reviewed: 08/17/2015 Elsevier Interactive Patient Education  2018 [employer]  Rotator Cuff Injury Rotator cuff injury is any type of injury to the set of muscles and tendons that make up the stabilizing unit of your shoulder. This unit holds the ball of your upper arm bone (humerus) in the socket of your shoulder blade (scapula). What are the causes? Injuries to your rotator cuff most commonly come from sports or activities that cause your arm to be moved repeatedly over your head. Examples of this include throwing, weight lifting, swimming, or racquet sports. Long lasting (chronic) irritation of your rotator cuff can cause soreness and swelling (inflammation), bursitis, and eventual damage to your  tendons, such as a tear (rupture). What are the signs or symptoms? Acute rotator cuff tear: Sudden tearing sensation followed by severe pain shooting from your upper shoulder down your arm toward your elbow. Decreased range of motion of your shoulder because of pain and muscle spasm. Severe pain. Inability to raise your arm out to the side because of pain and loss of muscle power (large tears).  Chronic rotator cuff tear: Pain that usually is worse at night and may interfere with sleep. Gradual weakness and decreased shoulder motion as the pain worsens. Decreased range of motion.  Rotator cuff tendinitis: Deep ache in your shoulder and the outside upper arm over your shoulder. Pain that comes on gradually and becomes worse when lifting your arm to the side or turning it inward.  How is this diagnosed? Rotator cuff injury is diagnosed through a medical history, physical exam, and imaging exam. The medical history helps determine the type of rotator cuff injury. Your health care provider will look at your injured shoulder, feel the injured area, and ask you to move your shoulder in different positions. X-ray exams typically are done to rule out other causes of shoulder pain, such as fractures. MRI is the exam of choice for the most severe shoulder injuries because the images show muscles and tendons. How is this treated? Chronic tear: Medicine for pain, such as acetaminophen or ibuprofen. Physical therapy and range-of-motion exercises may be helpful in maintaining shoulder function and strength. Steroid injections into your shoulder joint. Surgical repair of the rotator cuff if the injury does not heal with noninvasive treatment.  Acute tear: Anti-inflammatory medicines such as ibuprofen and naproxen to help reduce pain and swelling. A sling to help support your arm and rest your rotator cuff muscles. Long-term use of a sling is not advised. It may cause significant stiffening of the  shoulder joint. Surgery may be considered within a few weeks, especially in younger, active people, to return the shoulder to full function. Indications for surgical treatment include the following: Age younger than 60 years. Rotator cuff tears that are complete. Physical therapy, rest, and anti-inflammatory medicines have been used for 6-8 weeks, with no improvement. Employment or sporting activity that requires constant shoulder use.  Tendinitis: Anti-inflammatory medicines such as ibuprofen and naproxen to help reduce pain and swelling. A sling to help support your arm and rest your rotator cuff muscles. Long-term use of a sling is not advised. It may cause significant stiffening of the shoulder joint. Severe tendinitis may require: Steroid injections into your shoulder joint. Physical therapy. Surgery.  Follow these instructions at home: Apply ice to your injury: Put ice in a plastic bag. Place a towel between your skin and the bag. Leave the ice on for 20 minutes, 2-3  times a day. If you have a shoulder immobilizer (sling and straps), wear it until told otherwise by your health care provider. You may want to sleep on several pillows or in a recliner at night to lessen swelling and pain. Only take over-the-counter or prescription medicines for pain, discomfort, or fever as directed by your health care provider. Do simple hand squeezing exercises with a soft rubber ball to decrease hand swelling. Contact a health care provider if: Your shoulder pain increases, or new pain or numbness develops in your arm, hand, or fingers. Your hand or fingers are colder than your other hand. Get help right away if: Your arm, hand, or fingers are numb or tingling. Your arm, hand, or fingers are increasingly swollen and painful, or they turn white or blue. This information is not intended to replace advice given to you by your health care provider. Make sure you discuss any questions you have with  your health care provider. Document Released: 11/10/2000 Document Revised: 04/20/2016 Document Reviewed: 06/25/2013 Elsevier Interactive Patient Education  2018 [employer]

## 2017-08-01 ENCOUNTER — Telehealth (INDEPENDENT_AMBULATORY_CARE_PROVIDER_SITE_OTHER): Payer: Self-pay | Admitting: Orthopedic Surgery

## 2017-08-01 ENCOUNTER — Encounter (INDEPENDENT_AMBULATORY_CARE_PROVIDER_SITE_OTHER): Payer: Self-pay | Admitting: Orthopaedic Surgery

## 2017-08-01 NOTE — Telephone Encounter (Signed)
LVM with pt to please call to schedule surgery. Will try pt again at a later time. 

## 2017-08-18 ENCOUNTER — Emergency Department (HOSPITAL_COMMUNITY)
Admission: EM | Admit: 2017-08-18 | Discharge: 2017-08-18 | Disposition: A | Discharge status: Home / Self Care | Payer: No Typology Code available for payment source | Attending: Emergency Medicine | Admitting: Emergency Medicine

## 2017-08-18 ENCOUNTER — Encounter (HOSPITAL_COMMUNITY): Payer: Self-pay | Admitting: Emergency Medicine

## 2017-08-18 DIAGNOSIS — Y929 Unspecified place or not applicable: Secondary | ICD-10-CM | POA: Insufficient documentation

## 2017-08-18 DIAGNOSIS — X58XXXA Exposure to other specified factors, initial encounter: Secondary | ICD-10-CM | POA: Diagnosis not present

## 2017-08-18 DIAGNOSIS — F17210 Nicotine dependence, cigarettes, uncomplicated: Secondary | ICD-10-CM | POA: Insufficient documentation

## 2017-08-18 DIAGNOSIS — H9202 Otalgia, left ear: Secondary | ICD-10-CM | POA: Diagnosis present

## 2017-08-18 DIAGNOSIS — T162XXA Foreign body in left ear, initial encounter: Secondary | ICD-10-CM

## 2017-08-18 DIAGNOSIS — Y939 Activity, unspecified: Secondary | ICD-10-CM | POA: Insufficient documentation

## 2017-08-18 DIAGNOSIS — Y999 Unspecified external cause status: Secondary | ICD-10-CM | POA: Diagnosis not present

## 2017-08-18 HISTORY — DX: Unspecified osteoarthritis, unspecified site: M19.90

## 2017-08-18 MED ORDER — LIDOCAINE VISCOUS 2 % MT SOLN
15.0000 mL | Freq: Once | OROMUCOSAL | Status: AC
  Administered 2017-08-18: 15 mL via OROMUCOSAL
  Filled 2017-08-18: qty 15

## 2017-08-18 NOTE — ED Provider Notes (Signed)
[facility]-EMERGENCY DEPT Provider Note   CSN: [id number] Arrival date & time: 08/18/17  0228     History   Chief Complaint Chief Complaint  Patient presents with  . Otalgia    HPI Stephanie Chandler is a 51 y.o. female.  51 year old female presents to the emergency department for left ear discomfort. She states that symptoms began at approximately 2 AM. She notes awaking to a general discomfort in her left ear. She states that she heard something "buzzing" and believes that she may have a bug in her ear. No hearing loss or sharp pain. No drainage.   The history is provided by the patient. No language interpreter was used.  Otalgia     Past Medical History:  Diagnosis Date  . Arthritis     Patient Active Problem List   Diagnosis Date Noted  . Acute pain of right shoulder 03/15/2017  . Strain of right trapezius muscle 03/15/2017  . Musculoskeletal pain 03/15/2017  . OSA (obstructive sleep apnea) 08/23/2015  . Sciatica 11/13/2012    Past Surgical History:  Procedure Laterality Date  . ABDOMINAL HYSTERECTOMY    . HYSTERECTOMY ABDOMINAL WITH SALPINGECTOMY  1997    OB History    No data available       Home Medications    Prior to Admission medications   Medication Sig Start Date End Date Taking? Authorizing Provider  acetaminophen (TYLENOL) 500 MG tablet Take 2 tablets (1,000 mg total) by mouth every 6 (six) hours as needed. 05/17/17   Betancourt, Jarold Song, NP  cyclobenzaprine (FLEXERIL) 10 MG tablet Take 0.5-1 tablets (5-10 mg total) by mouth 3 (three) times daily as needed for muscle spasms. 06/26/17   Betancourt, Jarold Song, NP  fluticasone (FLONASE) 50 MCG/ACT nasal spray Place 1 spray into both nostrils 2 (two) times daily. 05/17/17 07/16/17  Betancourt, Jarold Song, NP  sodium chloride (OCEAN) 0.65 % SOLN nasal spray Place 2 sprays into both nostrils every 2 (two) hours while awake. 05/17/17 06/16/17  Betancourt, Jarold Song, NP    Family History Family History  Problem Relation Age  of Onset  . Diabetes Mother   . Hypertension Mother   . Arthritis Mother   . Diabetes Father   . Kidney disease Father   . Heart disease Maternal Grandmother   . Diabetes Paternal Grandmother     Social History Social History  Substance Use Topics  . Smoking status: Light Tobacco Smoker    Packs/day: 0.12    Years: 0.50    Types: Cigarettes  . Smokeless tobacco: Never Used  . Alcohol use 0.6 oz/week    1 Glasses of wine per week     Comment: 1 glass a year     Allergies   Flagyl [metronidazole]   Review of Systems Review of Systems  HENT: Positive for ear pain.    Ten systems reviewed and are negative for acute change, except as noted in the HPI.    Physical Exam Updated Vital Signs BP 138/80 (BP Location: Right Arm)   Pulse 81   Temp 98.1 F (36.7 C) (Oral)   Resp 20   Ht 5' (1.524 m)   Wt 90.7 kg (200 lb)   SpO2 100%   BMI 39.06 kg/m   Physical Exam  Constitutional: She is oriented to person, place, and time. She appears well-developed and well-nourished. No distress.  HENT:  Head: Normocephalic and atraumatic.  Left Ear: A foreign body is present.  Spider in left ear canal  Eyes:  Conjunctivae and EOM are normal. No scleral icterus.  Neck: Normal range of motion.  Pulmonary/Chest: Effort normal. No respiratory distress.  Musculoskeletal: Normal range of motion.  Neurological: She is alert and oriented to person, place, and time. She exhibits normal muscle tone. Coordination normal.  Skin: Skin is warm and dry. No rash noted. She is not diaphoretic. No erythema. No pallor.  Psychiatric: She has a normal mood and affect. Her behavior is normal.  Nursing note and vitals reviewed.    ED Treatments / Results  Labs (all labs ordered are listed, but only abnormal results are displayed) Labs Reviewed - No data to display  EKG  EKG Interpretation None       Radiology No results found.  Procedures .Foreign Body Removal Date/Time: 08/18/2017  5:58 AM Performed by: Geoffery Lyons Authorized by: Antony Madura  Consent: The procedure was performed in an emergent situation. Verbal consent obtained. Risks and benefits: risks, benefits and alternatives were discussed Consent given by: patient Patient understanding: patient states understanding of the procedure being performed Patient consent: the patient's understanding of the procedure matches consent given Procedure consent: procedure consent matches procedure scheduled Relevant documents: relevant documents present and verified Test results: test results available and properly labeled Imaging studies: imaging studies available Required items: required blood products, implants, devices, and special equipment available Patient identity confirmed: verbally with patient and arm band Body area: ear Location details: left ear  Anesthesia: Local anesthetic: Viscous lidocaine. Anesthetic total: 2 mL  Sedation: Patient sedated: no Patient restrained: no Patient cooperative: yes Localization method: visualized Removal mechanism: irrigation Complexity: simple 1 objects recovered. Objects recovered: spider Post-procedure assessment: foreign body removed Patient tolerance: Patient tolerated the procedure well with no immediate complications   (including critical care time)  Medications Ordered in ED Medications  lidocaine (XYLOCAINE) 2 % viscous mouth solution 15 mL (not administered)     Initial Impression / Assessment and Plan / ED Course  I have reviewed the triage vital signs and the nursing notes.  Pertinent labs & imaging results that were available during my care of the patient were reviewed by me and considered in my medical decision making (see chart for details).     51 year old female presents for foreign body in her left ear. Spider noted in her ear canal. Insect crawled out of the ear following viscous lidocaine flush. Patient tolerated procedure well with no  immediate complications. Visualized TM postprocedure with no residual foreign body. Patient discharged in stable condition.   Final Clinical Impressions(s) / ED Diagnoses   Final diagnoses:  Foreign body of left ear, initial encounter    New Prescriptions New Prescriptions   No medications on file     Antony Madura, Cordelia Poche 08/18/17 0559    Geoffery Lyons, MD 08/18/17 [phone]

## 2017-08-18 NOTE — ED Triage Notes (Signed)
Pt states she feels something moving in L ear x 30 minutes.

## 2017-08-18 NOTE — ED Notes (Signed)
ED Provider at bedside. 

## 2017-08-18 NOTE — ED Notes (Signed)
Pt signed d/c. Discussed follow up, pt verbalized understanding.

## 2017-08-28 ENCOUNTER — Ambulatory Visit: Payer: Self-pay | Admitting: Registered Nurse

## 2017-08-28 VITALS — BP 116/91 | HR 90 | Temp 99.2°F

## 2017-08-28 DIAGNOSIS — S00412A Abrasion of left ear, initial encounter: Secondary | ICD-10-CM

## 2017-08-28 NOTE — Progress Notes (Signed)
Subjective:    Patient ID: Stephanie Chandler, female    DOB: 12-Mar-1966, 51 y.o.   MRN: [id number]  51y/o established african american female  Pt reports having spider in L ear canal that was removed after lidocaine flush in ER 2 weeks ago. Crawled out live. Has been having pain in ear canal since then. Unknown type of spider. Unknown if it bit her. Denied hearing issues. Reports some drainage from that ear but believes it may be related to allergies. Pain intermittently in ear.  Has been using qtips in ear for itching and trying to see if anything comes out.  Patient also would like labs drawn as ordered by [facility] but not fasting today and fasting labs ordered to check blood sugar.  Patient has scheduled tobacco cessation classes so she can become eligible for health insurance discount at work.       Review of Systems  Constitutional: Negative for activity change, appetite change, chills, diaphoresis, fatigue, fever and unexpected weight change.  HENT: Positive for congestion, ear pain, postnasal drip and rhinorrhea. Negative for dental problem, drooling, ear discharge, facial swelling, hearing loss, mouth sores, nosebleeds, sinus pain, sinus pressure, sneezing, sore throat, tinnitus, trouble swallowing and voice change.   Eyes: Negative for photophobia, pain, discharge, redness, itching and visual disturbance.  Respiratory: Negative for cough, choking, chest tightness, shortness of breath, wheezing and stridor.   Cardiovascular: Negative for chest pain, palpitations and leg swelling.  Gastrointestinal: Negative for abdominal distention, abdominal pain, blood in stool, constipation, diarrhea, nausea and vomiting.  Endocrine: Negative for cold intolerance and heat intolerance.  Genitourinary: Negative for difficulty urinating, dysuria and hematuria.  Musculoskeletal: Positive for arthralgias and myalgias. Negative for back pain, gait problem, joint swelling, neck pain and neck stiffness.  Skin:  Negative for color change, pallor, rash and wound.  Allergic/Immunologic: Positive for environmental allergies. Negative for food allergies.  Neurological: Negative for dizziness, tremors, seizures, syncope, facial asymmetry, speech difficulty, weakness, light-headedness, numbness and headaches.  Hematological: Negative for adenopathy. Does not bruise/bleed easily.  Psychiatric/Behavioral: Negative for agitation, behavioral problems, confusion and sleep disturbance.       Objective:   Physical Exam  Constitutional: She is oriented to person, place, and time. She appears well-developed and well-nourished. She is active and cooperative.  Non-toxic appearance. She does not have a sickly appearance. She appears ill. No distress.  HENT:  Head: Normocephalic and atraumatic.  Right Ear: Hearing, external ear and ear canal normal. A middle ear effusion is present.  Left Ear: Hearing, external ear and ear canal normal. A middle ear effusion is present.  Nose: Mucosal edema and rhinorrhea present. No nose lacerations, sinus tenderness, nasal deformity, septal deviation or nasal septal hematoma. No epistaxis.  No foreign bodies. Right sinus exhibits no maxillary sinus tenderness and no frontal sinus tenderness. Left sinus exhibits no maxillary sinus tenderness and no frontal sinus tenderness.  Mouth/Throat: Uvula is midline and mucous membranes are normal. Mucous membranes are not pale, not dry and not cyanotic. She does not have dentures. No oral lesions. No trismus in the jaw. Normal dentition. No dental abscesses, uvula swelling, lacerations or dental caries. Posterior oropharyngeal edema and posterior oropharyngeal erythema present. No oropharyngeal exudate or tonsillar abscesses.  Abrasion and vesicle noted 6 oclock left external auditory canal midway to TM along with 5 linear "hairs or insect legs" debris between vesicle and TM.  Attempted to retrieve debris with cotton applicator but unsuccessful  clearing all; patient reported tenderness when vesicle palpated  with cotton applicator no drainage no erythema no bleeding and scaling of epidermal layer adjacent to vesicle; bilateral TMs air fluid level clear; cobblestoning posterior pharynx; bilateral allergic shiners  Eyes: Pupils are equal, round, and reactive to light. Conjunctivae, EOM and lids are normal. Right eye exhibits no chemosis, no discharge, no exudate and no hordeolum. No foreign body present in the right eye. Left eye exhibits no chemosis, no discharge, no exudate and no hordeolum. No foreign body present in the left eye. Right conjunctiva is not injected. Right conjunctiva has no hemorrhage. Left conjunctiva is not injected. Left conjunctiva has no hemorrhage. No scleral icterus. Right eye exhibits normal extraocular motion and no nystagmus. Left eye exhibits normal extraocular motion and no nystagmus. Right pupil is round and reactive. Left pupil is round and reactive. Pupils are equal.  Neck: Trachea normal and normal range of motion. Neck supple. No tracheal tenderness, no spinous process tenderness and no muscular tenderness present. No neck rigidity. No tracheal deviation, no edema, no erythema and normal range of motion present. No thyroid mass and no thyromegaly present.  Cardiovascular: Normal rate, regular rhythm, S1 normal, S2 normal, normal heart sounds and intact distal pulses.  PMI is not displaced.  Exam reveals no gallop and no friction rub.   No murmur heard. Pulmonary/Chest: Effort normal and breath sounds normal. No accessory muscle usage or stridor. No respiratory distress. She has no decreased breath sounds. She has no wheezes. She has no rhonchi. She has no rales. She exhibits no tenderness.  Abdominal: Soft. She exhibits no distension.  Musculoskeletal: Normal range of motion. She exhibits no edema or deformity.       Right shoulder: Normal.       Left shoulder: Normal.       Right hip: Normal.       Left hip:  Normal.       Right knee: Normal.       Left knee: Normal.       Cervical back: Normal.       Right hand: Normal.       Left hand: Normal.  Lymphadenopathy:       Head (right side): No submental, no submandibular, no tonsillar, no preauricular, no posterior auricular and no occipital adenopathy present.       Head (left side): No submental, no submandibular, no tonsillar, no preauricular, no posterior auricular and no occipital adenopathy present.    She has no cervical adenopathy.       Right cervical: No superficial cervical, no deep cervical and no posterior cervical adenopathy present.      Left cervical: No superficial cervical, no deep cervical and no posterior cervical adenopathy present.  Neurological: She is alert and oriented to person, place, and time. She has normal strength. She is not disoriented. She displays no atrophy and no tremor. No cranial nerve deficit or sensory deficit. She exhibits normal muscle tone. She displays no seizure activity. Coordination and gait normal. GCS eye subscore is 4. GCS verbal subscore is 5. GCS motor subscore is 6.  Skin: Skin is warm, dry and intact. No abrasion, no bruising, no burn, no ecchymosis, no laceration, no lesion, no petechiae and no rash noted. She is not diaphoretic. No cyanosis or erythema. No pallor. Nails show no clubbing.  Psychiatric: She has a normal mood and affect. Her speech is normal and behavior is normal. Judgment and thought content normal. Cognition and memory are normal.  Nursing note and vitals reviewed.  Assessment & Plan:  A-auditory canal abrasion left initial encounter, hyperglycemia history, nicotine dependence  P-May instill mineral oil 5 gtts left daily.  Treatment as ordered.  Symptomatic therapy suggested fluids, tylenol and avoid using q-tips in ears.  May take Tylenol  po QID prn pain.  Call or return to clinic as needed if these symptoms worsen or fail to improve as anticipated.    I do not  see where any further testing or imaging is necessary at this time.   I will suggest supportive care, rest, good hygiene and encourage the patient to take adequate fluids.  The patient is to return to clinic or PCM if symptoms worsen or change significantly e.g. Worsening ear pain, fever, purulent discharge from ears or bleeding.  Exitcare handout on abrasion given to patient.  Patient verbalized agreement and understanding of treatment plan and had no further questions at this time.    Patient to follow up with RN Rolly Salter to schedule fasting lab draw executive panel, CBC and HgbA1c and schedule appt with PCM.  Keep tobacco cessation classes as scheduled and bring in class completion certificate to RN [facility] when complete.  Patient verbalized understanding information/instructions, agreed with plan of care and had no further questions at this time.

## 2017-08-29 NOTE — Patient Instructions (Signed)
Abrasion An abrasion is a cut or scrape on the outer surface of your skin. An abrasion does not extend through all of the layers of your skin. It is important to care for your abrasion properly to prevent infection. What are the causes? Most abrasions are caused by falling on or gliding across the ground or another surface. When your skin rubs on something, the outer and inner layer of skin rubs off. What are the signs or symptoms? A cut or scrape is the main symptom of this condition. The scrape may be bleeding, or it may appear red or pink. If there was an associated fall, there may be an underlying bruise. How is this diagnosed? An abrasion is diagnosed with a physical exam. How is this treated? Treatment for this condition depends on how large and deep the abrasion is. Usually, your abrasion will be cleaned with water and mild soap. This removes any dirt or debris that may be stuck. An antibiotic ointment may be applied to the abrasion to help prevent infection. A bandage (dressing) may be placed on the abrasion to keep it clean. You may also need a tetanus shot. Follow these instructions at home: Medicines   Take or apply medicines only as directed by your health care provider.  If you were prescribed an antibiotic ointment, finish all of it even if you start to feel better. Wound care   Clean the wound with mild soap and water 2-3 times per day or as directed by your health care provider. Pat your wound dry with a clean towel. Do not rub it.  There are many different ways to close and cover a wound. Follow instructions from your health care provider about:  Wound care.  Dressing changes and removal.  Check your wound every day for signs of infection. Watch for:  Redness, swelling, or pain.  Fluid, blood, or pus. General instructions    Keep the dressing dry as directed by your health care provider. Do not take baths, swim, use a hot tub, or do anything that would put your  wound underwater until your health care provider approves.  If there is swelling, raise (elevate) the injured area above the level of your heart while you are sitting or lying down.  Keep all follow-up visits as directed by your health care provider. This is important. Contact a health care provider if:  You received a tetanus shot and you have swelling, severe pain, redness, or bleeding at the injection site.  Your pain is not controlled with medicine.  You have increased redness, swelling, or pain at the site of your wound. Get help right away if:  You have a red streak going away from your wound.  You have a fever.  You have fluid, blood, or pus coming from your wound.  You notice a bad smell coming from your wound or your dressing. This information is not intended to replace advice given to you by your health care provider. Make sure you discuss any questions you have with your health care provider. Document Released: 08/23/2005 Document Revised: 07/14/2016 Document Reviewed: 11/11/2014 [employer]vier Interactive Patient Education  2017 [employer]  

## 2017-09-04 ENCOUNTER — Ambulatory Visit: Payer: Self-pay | Admitting: *Deleted

## 2017-09-04 VITALS — BP 146/110

## 2017-09-04 DIAGNOSIS — Z0000 Encounter for general adult medical examination without abnormal findings: Secondary | ICD-10-CM

## 2017-09-04 NOTE — Progress Notes (Signed)
Labs drawn for pcp annual exam.   Does not qualify for [facility]. Has quit smoking recently but has not completed class. 2 more classes remaining after missing original dates.

## 2017-09-05 LAB — CMP12+LP+TP+TSH+6AC+CBC/D/PLT
ALT: 20 IU/L (ref 0–32)
AST: 15 IU/L (ref 0–40)
Albumin/Globulin Ratio: 1.6 (ref 1.2–2.2)
Albumin: 4.1 g/dL (ref 3.5–5.5)
Alkaline Phosphatase: 65 IU/L (ref 39–117)
BUN/Creatinine Ratio: 15 (ref 9–23)
BUN: 11 mg/dL (ref 6–24)
Basophils Absolute: 0 10*3/uL (ref 0.0–0.2)
Basos: 0 %
Bilirubin Total: 0.3 mg/dL (ref 0.0–1.2)
Calcium: 9.5 mg/dL (ref 8.7–10.2)
Chloride: 103 mmol/L (ref 96–106)
Chol/HDL Ratio: 4.1 ratio (ref 0.0–4.4)
Cholesterol, Total: 171 mg/dL (ref 100–199)
Creatinine, Ser: 0.74 mg/dL (ref 0.57–1.00)
EOS (ABSOLUTE): 0.1 10*3/uL (ref 0.0–0.4)
Eos: 2 %
Estimated CHD Risk: 0.9 times avg. (ref 0.0–1.0)
Free Thyroxine Index: 1.6 (ref 1.2–4.9)
GFR calc Af Amer: 108 mL/min/{1.73_m2} (ref >59)
GFR calc non Af Amer: 93 mL/min/{1.73_m2} (ref >59)
GGT: 39 IU/L (ref 0–60)
Globulin, Total: 2.5 g/dL (ref 1.5–4.5)
Glucose: 113 mg/dL — ABNORMAL HIGH (ref 65–99)
HDL: 42 mg/dL (ref >39)
Hematocrit: 39.5 % (ref 34.0–46.6)
Hemoglobin: 13 g/dL (ref 11.1–15.9)
Immature Grans (Abs): 0 10*3/uL (ref 0.0–0.1)
Immature Granulocytes: 0 %
Iron: 96 ug/dL (ref 27–159)
LDH: 177 IU/L (ref 119–226)
LDL Calculated: 116 mg/dL — ABNORMAL HIGH (ref 0–99)
Lymphocytes Absolute: 1.9 10*3/uL (ref 0.7–3.1)
Lymphs: 32 %
MCH: 29.7 pg (ref 26.6–33.0)
MCHC: 32.9 g/dL (ref 31.5–35.7)
MCV: 90 fL (ref 79–97)
Monocytes Absolute: 0.5 10*3/uL (ref 0.1–0.9)
Monocytes: 8 %
Neutrophils Absolute: 3.5 10*3/uL (ref 1.4–7.0)
Neutrophils: 58 %
Phosphorus: 3.3 mg/dL (ref 2.5–4.5)
Platelets: 314 10*3/uL (ref 150–379)
Potassium: 4.2 mmol/L (ref 3.5–5.2)
RBC: 4.38 x10E6/uL (ref 3.77–5.28)
RDW: 13.9 % (ref 12.3–15.4)
Sodium: 140 mmol/L (ref 134–144)
T3 Uptake Ratio: 24 % (ref 24–39)
T4, Total: 6.5 ug/dL (ref 4.5–12.0)
TSH: 0.695 u[IU]/mL (ref 0.450–4.500)
Total Protein: 6.6 g/dL (ref 6.0–8.5)
Triglycerides: 65 mg/dL (ref 0–149)
Uric Acid: 3.5 mg/dL (ref 2.5–7.1)
VLDL Cholesterol Cal: 13 mg/dL (ref 5–40)
WBC: 6 10*3/uL (ref 3.4–10.8)

## 2017-09-05 LAB — HGB A1C W/O EAG: Hgb A1c MFr Bld: 6 % — ABNORMAL HIGH (ref 4.8–5.6)
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
## 2017-09-11 ENCOUNTER — Telehealth (INDEPENDENT_AMBULATORY_CARE_PROVIDER_SITE_OTHER): Payer: Self-pay | Admitting: Orthopedic Surgery

## 2017-09-19 NOTE — Telephone Encounter (Signed)
Closing encounter

## 2017-10-04 NOTE — Telephone Encounter (Signed)
Spoke with patient, she wants to wait until spring.  She will call back Feb/March to schedule for possibly April.

## 2017-10-30 ENCOUNTER — Ambulatory Visit: Payer: Self-pay | Admitting: Registered Nurse

## 2017-11-01 ENCOUNTER — Ambulatory Visit: Payer: Self-pay | Admitting: Registered Nurse

## 2017-11-01 VITALS — BP 133/93 | HR 81 | Temp 97.4°F

## 2017-11-01 DIAGNOSIS — J209 Acute bronchitis, unspecified: Secondary | ICD-10-CM

## 2017-11-01 DIAGNOSIS — J0101 Acute recurrent maxillary sinusitis: Secondary | ICD-10-CM

## 2017-11-01 DIAGNOSIS — H6593 Unspecified nonsuppurative otitis media, bilateral: Secondary | ICD-10-CM

## 2017-11-01 MED ORDER — FLUTICASONE PROPIONATE 50 MCG/ACT NA SUSP: 1.0000 | Freq: Two times a day (BID) | NASAL | 0 refills | Status: DC

## 2017-11-01 MED ORDER — SALINE SPRAY 0.65 % NA SOLN: 2.0000 | NASAL | 0 refills | Status: DC

## 2017-11-01 MED ORDER — BENZONATATE 200 MG PO CAPS: 200.0000 mg | ORAL_CAPSULE | Freq: Three times a day (TID) | ORAL | 0 refills | Status: DC | PRN

## 2017-11-01 MED ORDER — ACETAMINOPHEN 500 MG PO TABS: 1000.0000 mg | ORAL_TABLET | Freq: Four times a day (QID) | ORAL | 0 refills | Status: AC | PRN

## 2017-11-01 MED ORDER — AMOXICILLIN-POT CLAVULANATE 875-125 MG PO TABS: 1.0000 | ORAL_TABLET | Freq: Two times a day (BID) | ORAL | 0 refills | Status: DC

## 2017-11-01 MED ORDER — ALBUTEROL SULFATE HFA 108 (90 BASE) MCG/ACT IN AERS: 1.0000 | INHALATION_SPRAY | RESPIRATORY_TRACT | 0 refills | Status: DC | PRN

## 2017-11-01 NOTE — Progress Notes (Signed)
Subjective:    Patient ID: Stephanie Chandler, female    DOB: November 25, 1966, 51 y.o.   MRN: [id number]  51y/o african american female established Pt reports frontal and maxillary sinus pain and pressure x4 days. Rhinorrhea, productive cough, chest congestion, headache. Reports L ear drainage. Using Flonase 2 sprays once a day and Alka Seltzer plus, and today took phenylephrine today.  Not consistently using nasal saline in the shower doesn't like that it burns.  Planning to visit grandchildren this weekend and exposed to 52 year old with strep throat also.  Worried  She may pass illness to grandchildren age 58 & 8.  Last sinusitis treated with doxycycline.  Needs refill on her inhaler ran out.      Review of Systems  Constitutional: Negative for activity change, appetite change, chills, diaphoresis, fatigue, fever and unexpected weight change.  HENT: Positive for congestion, ear discharge, ear pain, postnasal drip, rhinorrhea, sinus pressure, sinus pain and sore throat. Negative for dental problem, drooling, facial swelling, hearing loss, mouth sores, nosebleeds, sneezing, tinnitus, trouble swallowing and voice change.   Eyes: Negative for photophobia, pain, discharge, redness, itching and visual disturbance.  Respiratory: Positive for cough. Negative for choking, chest tightness, shortness of breath, wheezing and stridor.   Cardiovascular: Negative for chest pain, palpitations and leg swelling.  Gastrointestinal: Negative for abdominal distention, abdominal pain, blood in stool, constipation, diarrhea, nausea and vomiting.  Endocrine: Negative for cold intolerance and heat intolerance.  Genitourinary: Negative for difficulty urinating, dysuria and hematuria.  Musculoskeletal: Negative for arthralgias, back pain, gait problem, joint swelling, myalgias, neck pain and neck stiffness.  Skin: Negative for color change, pallor, rash and wound.  Allergic/Immunologic: Positive for environmental allergies.  Negative for food allergies.  Neurological: Positive for headaches. Negative for dizziness, tremors, seizures, syncope, facial asymmetry, speech difficulty, weakness, light-headedness and numbness.  Hematological: Negative for adenopathy. Does not bruise/bleed easily.  Psychiatric/Behavioral: Positive for sleep disturbance. Negative for agitation, behavioral problems and confusion.       Objective:   Physical Exam  Constitutional: She is oriented to person, place, and time. She appears well-developed and well-nourished. She is active and cooperative.  Non-toxic appearance. She does not have a sickly appearance. She appears ill. No distress.  HENT:  Head: Normocephalic and atraumatic.  Right Ear: Hearing, external ear and ear canal normal. Tympanic membrane is erythematous and bulging. A middle ear effusion is present.  Left Ear: Hearing, external ear and ear canal normal. Tympanic membrane is erythematous and bulging. A middle ear effusion is present.  Nose: Mucosal edema and rhinorrhea present. No nose lacerations, sinus tenderness, nasal deformity, septal deviation or nasal septal hematoma. No epistaxis.  No foreign bodies. Right sinus exhibits maxillary sinus tenderness and frontal sinus tenderness. Left sinus exhibits no maxillary sinus tenderness and no frontal sinus tenderness.  Mouth/Throat: Uvula is midline and mucous membranes are normal. Mucous membranes are not pale, not dry and not cyanotic. She does not have dentures. No oral lesions. No trismus in the jaw. Normal dentition. No dental abscesses, uvula swelling, lacerations or dental caries. Posterior oropharyngeal edema and posterior oropharyngeal erythema present. No oropharyngeal exudate or tonsillar abscesses.  Faint erythema periphery bilateral TMs air fluid level clear; cobblestoning posterior pharynx; bilateral nasal turbinates edema/erythema/clear discharge; hoarse voice; bilateral allergic shiners; maxillary greater than frontal  sinuses TTP  Eyes: Conjunctivae, EOM and lids are normal. Pupils are equal, round, and reactive to light. Right eye exhibits no chemosis, no discharge, no exudate and no hordeolum. No  foreign body present in the right eye. Left eye exhibits no chemosis, no discharge, no exudate and no hordeolum. No foreign body present in the left eye. Right conjunctiva is not injected. Right conjunctiva has no hemorrhage. Left conjunctiva is not injected. Left conjunctiva has no hemorrhage. No scleral icterus. Right eye exhibits normal extraocular motion and no nystagmus. Left eye exhibits normal extraocular motion and no nystagmus. Right pupil is round and reactive. Left pupil is round and reactive. Pupils are equal.  Neck: Trachea normal and normal range of motion. Neck supple. No tracheal tenderness, no spinous process tenderness and no muscular tenderness present. No neck rigidity. No tracheal deviation, no edema, no erythema and normal range of motion present. No thyroid mass and no thyromegaly present.  Cardiovascular: Normal rate, regular rhythm, S1 normal, S2 normal, normal heart sounds and intact distal pulses. PMI is not displaced. Exam reveals no gallop and no friction rub.  No murmur heard. Pulmonary/Chest: Effort normal. No accessory muscle usage or stridor. No respiratory distress. She has decreased breath sounds in the right lower field and the left lower field. She has no wheezes. She has no rhonchi. She has no rales. She exhibits no tenderness.  Cough not observed in exam room; spoke full sentences without difficulty  Abdominal: Soft. Normal appearance. She exhibits no distension, no fluid wave and no ascites. There is no rigidity and no guarding.  Musculoskeletal: Normal range of motion. She exhibits no edema or tenderness.       Right shoulder: Normal.       Left shoulder: Normal.       Right elbow: Normal.      Left elbow: Normal.       Right hip: Normal.       Left hip: Normal.       Right knee:  Normal.       Left knee: Normal.       Cervical back: Normal.       Thoracic back: Normal.       Lumbar back: Normal.       Right hand: Normal.       Left hand: Normal.  Lymphadenopathy:       Head (right side): No submental, no submandibular, no tonsillar, no preauricular, no posterior auricular and no occipital adenopathy present.       Head (left side): No submental, no submandibular, no tonsillar, no preauricular, no posterior auricular and no occipital adenopathy present.    She has no cervical adenopathy.       Right cervical: No superficial cervical, no deep cervical and no posterior cervical adenopathy present.      Left cervical: No superficial cervical, no deep cervical and no posterior cervical adenopathy present.  Neurological: She is alert and oriented to person, place, and time. She has normal strength. She is not disoriented. She displays no atrophy and no tremor. No cranial nerve deficit or sensory deficit. She exhibits normal muscle tone. She displays no seizure activity. Coordination and gait normal. GCS eye subscore is 4. GCS verbal subscore is 5. GCS motor subscore is 6.  On/off exam table; in/out of chair without difficulty; gait sure and steady in hallway  Skin: Skin is warm, dry and intact. No abrasion, no bruising, no burn, no ecchymosis, no laceration, no lesion, no petechiae and no rash noted. She is not diaphoretic. No cyanosis or erythema. No pallor. Nails show no clubbing.  Psychiatric: She has a normal mood and affect. Her speech is normal and behavior  is normal. Judgment and thought content normal. Cognition and memory are normal.  Nursing note and vitals reviewed.         Assessment & Plan:  A-acute recurrent maxillary sinusitis, otitis media with effusion bilaterally, acute bronchitis, elevated blood pressure  P-Restart flonase 1 spray each nostril BID #1 RF0 dispensed from [facility], saline 2 sprays each nostril q2h wa prn congestion given 1 bottle from clinic  stock.  If no improvement with 48 hours of saline and flonase use start augmentin 875mg  po BID x 10 days #20 RF0 dispensed from [facility].  May continue phenylephrine 10mg  po q4-6h prn rhinitis OTC discussed can increase BP/heartrate, cause insomnia and/or drowsiness.  Denied personal or family history of ENT cancer.  Shower BID especially prior to bed. No evidence of systemic bacterial infection, non toxic and well hydrated.  I do not see where any further testing or imaging is necessary at this time.   I will suggest supportive care, rest, good hygiene and encourage the patient to take adequate fluids.  The patient is to return to clinic or EMERGENCY ROOM if symptoms worsen or change significantly.  Exitcare handout on sinusitis and sinus rinse given to patient.  Patient verbalized agreement and understanding of treatment plan and had no further questions at this time.   P2:  Hand washing and cover cough  Supportive treatment.   No evidence of invasive bacterial infection, non toxic and well hydrated.  This is most likely self limiting viral infection.  I do not see where any further testing or imaging is necessary at this time.   I will suggest supportive care, rest, good hygiene and encourage the patient to take adequate fluids.  The patient is to return to clinic or EMERGENCY ROOM if symptoms worsen or change significantly e.g. ear pain, fever, purulent discharge from ears or bleeding.  Exitcare handout on otitis media with effusion given to patient.  Patient verbalized agreement and understanding of treatment plan.     Given 2 masks from clinic stock.  Post nasal drip and weather change probable cause.  Electronic Rx Tessalon Pearles 200mg  po TID prn cough #30 RF0  Ran out of albuterol Refilled electronic Rx to her chosen pharmacy.  Albuterol MDI 90mcg 1-2 puffs po q4-6h prn protracted cough/wheeze #1 RF0 side effect increased heart rate. Bronchitis simple, community acquired, may have started as viral  (probably respiratory syncytial, parainfluenza, influenza, or adenovirus), but now evidence of acute purulent bronchitis with resultant bronchial edema and mucus formation.  Viruses are the most common cause of bronchial inflammation in otherwise healthy adults with acute bronchitis.  The appearance of sputum is not predictive of whether a bacterial infection is present.  Purulent sputum is most often caused by viral infections.  There are a small portion of those caused by non-viral agents being Mycoplama pneumonia.  Microscopic examination or C&S of sputum in the healthy adult with acute bronchitis is generally not helpful (usually negative or normal respiratory flora) other considerations being cough from upper respiratory tract infections, sinusitis or allergic syndromes (mild asthma or viral pneumonia).  Differential Diagnoses:  reactive airway disease (asthma, allergic aspergillosis (eosinophilia), chronic bronchitis, respiratory infection (sinusitis, common cold, pneumonia), congestive heart failure, reflux esophagitis, bronchogenic tumor, aspiration syndromes and/or exposure to pulmonary irritants/smoke.   Without high fever, severe dyspnea, lack of physical findings or other risk factors, I will hold on a chest radiograph and CBC at this time.  I discussed that approximately 50% of patients with acute bronchitis have  a cough that lasts up to three weeks, and 25% for over a month.  Tylenol 500mg  one to two tablets every four to six hours as needed for fever or myalgias.  No aspirin. Exitcare handout on bronchitis and inhaler use given to patient.  ER if hemopthysis, SOB, worst chest pain of life.   Patient instructed to follow up in one week or sooner if symptoms worsen.  Patient verbalized agreement and understanding of treatment plan.  P2:  hand washing and cover cough  Elevated blood pressure probably from acute illness and phenylephrine/OTC URI medication use.  Discussed if worst headache of her life,  chest pain, dyspnea, visual changes seek re-evaluation.  Do not take any further phenylephrine or OTC cough and cold medications (stop taking) if above symptoms occur.  Patient verbalized understanding information/instructions, agreed with plan of care and had no further questions at this time.

## 2017-11-01 NOTE — Patient Instructions (Addendum)
Sinus Rinse What is a sinus rinse? A sinus rinse is a simple home treatment that is used to rinse your sinuses with a sterile mixture of salt and water (saline solution). Sinuses are air-filled spaces in your skull behind the bones of your face and forehead that open into your nasal cavity. You will use the following:  Saline solution.  Neti pot or spray bottle. This releases the saline solution into your nose and through your sinuses. Neti pots and spray bottles can be purchased at [employer], a health food store, or online.  When would I do a sinus rinse? A sinus rinse can help to clear mucus, dirt, dust, or pollen from the nasal cavity. You may do a sinus rinse when you have a cold, a virus, nasal allergy symptoms, a sinus infection, or stuffiness in the nose or sinuses. If you are considering a sinus rinse:  Ask your child's health care provider before performing a sinus rinse on your child.  Do not do a sinus rinse if you have had ear or nasal surgery, ear infection, or blocked ears.  How do I do a sinus rinse?  Wash your hands.  Disinfect your device according to the directions provided and then dry it.  Use the solution that comes with your device or one that is sold separately in stores. Follow the mixing directions on the package.  Fill your device with the amount of saline solution as directed by the device instructions.  Stand over a sink and tilt your head sideways over the sink.  Place the spout of the device in your upper nostril (the one closer to the ceiling).  Gently pour or squeeze the saline solution into the nasal cavity. The liquid should drain to the lower nostril if you are not overly congested.  Gently blow your nose. Blowing too hard may cause ear pain.  Repeat in the other nostril.  Clean and rinse your device with clean water and then air-dry it. Are there risks of a sinus rinse? Sinus rinse is generally very safe and effective. However,  there are a few risks, which include:  A burning sensation in the sinuses. This may happen if you do not make the saline solution as directed. Make sure to follow all directions when making the saline solution.  Infection from contaminated water. This is rare, but possible.  Nasal irritation.  This information is not intended to replace advice given to you by your health care provider. Make sure you discuss any questions you have with your health care provider. Document Released: 06/10/2014 Document Revised: 10/10/2016 Document Reviewed: 03/31/2014 Elsevier Interactive Patient Education  2017 [employer] Sinusitis, Adult Sinusitis is soreness and inflammation of your sinuses. Sinuses are hollow spaces in the bones around your face. Your sinuses are located:  Around your eyes.  In the middle of your forehead.  Behind your nose.  In your cheekbones.  Your sinuses and nasal passages are lined with a stringy fluid (mucus). Mucus normally drains out of your sinuses. When your nasal tissues become inflamed or swollen, the mucus can become trapped or blocked so air cannot flow through your sinuses. This allows bacteria, viruses, and funguses to grow, which leads to infection. Sinusitis can develop quickly and last for 7?10 days (acute) or for more than 12 weeks (chronic). Sinusitis often develops after a cold. What are the causes? This condition is caused by anything that creates swelling in the sinuses or stops mucus from draining, including:  Allergies.  Asthma.  Bacterial or viral infection.  Abnormally shaped bones between the nasal passages.  Nasal growths that contain mucus (nasal polyps).  Narrow sinus openings.  Pollutants, such as chemicals or irritants in the air.  A foreign object stuck in the nose.  A fungal infection. This is rare.  What increases the risk? The following factors may make you more likely to develop this condition:  Having allergies or  asthma.  Having had a recent cold or respiratory tract infection.  Having structural deformities or blockages in your nose or sinuses.  Having a weak immune system.  Doing a lot of swimming or diving.  Overusing nasal sprays.  Smoking.  What are the signs or symptoms? The main symptoms of this condition are pain and a feeling of pressure around the affected sinuses. Other symptoms include:  Upper toothache.  Earache.  Headache.  Bad breath.  Decreased sense of smell and taste.  A cough that may get worse at night.  Fatigue.  Fever.  Thick drainage from your nose. The drainage is often green and it may contain pus (purulent).  Stuffy nose or congestion.  Postnasal drip. This is when extra mucus collects in the throat or back of the nose.  Swelling and warmth over the affected sinuses.  Sore throat.  Sensitivity to light.  How is this diagnosed? This condition is diagnosed based on symptoms, a medical history, and a physical exam. To find out if your condition is acute or chronic, your health care provider may:  Look in your nose for signs of nasal polyps.  Tap over the affected sinus to check for signs of infection.  View the inside of your sinuses using an imaging device that has a light attached (endoscope).  If your health care provider suspects that you have chronic sinusitis, you may also:  Be tested for allergies.  Have a sample of mucus taken from your nose (nasal culture) and checked for bacteria.  Have a mucus sample examined to see if your sinusitis is related to an allergy.  If your sinusitis does not respond to treatment and it lasts longer than 8 weeks, you may have an MRI or CT scan to check your sinuses. These scans also help to determine how severe your infection is. In rare cases, a bone biopsy may be done to rule out more serious types of fungal sinus disease. How is this treated? Treatment for sinusitis depends on the cause and  whether your condition is chronic or acute. If a virus is causing your sinusitis, your symptoms will go away on their own within 10 days. You may be given medicines to relieve your symptoms, including:  Topical nasal decongestants. They shrink swollen nasal passages and let mucus drain from your sinuses.  Antihistamines. These drugs block inflammation that is triggered by allergies. This can help to ease swelling in your nose and sinuses.  Topical nasal corticosteroids. These are nasal sprays that ease inflammation and swelling in your nose and sinuses.  Nasal saline washes. These rinses can help to get rid of thick mucus in your nose.  If your condition is caused by bacteria, you will be given an antibiotic medicine. If your condition is caused by a fungus, you will be given an antifungal medicine. Surgery may be needed to correct underlying conditions, such as narrow nasal passages. Surgery may also be needed to remove polyps. Follow these instructions at home: Medicines  Take, use, or apply over-the-counter and prescription medicines only as told by   your health care provider. These may include nasal sprays.  If you were prescribed an antibiotic medicine, take it as told by your health care provider. Do not stop taking the antibiotic even if you start to feel better. Hydrate and Humidify  Drink enough water to keep your urine clear or pale yellow. Staying hydrated will help to thin your mucus.  Use a cool mist humidifier to keep the humidity level in your home above 50%.  Inhale steam for 10-15 minutes, 3-4 times a day or as told by your health care provider. You can do this in the bathroom while a hot shower is running.  Limit your exposure to cool or dry air. Rest  Rest as much as possible.  Sleep with your head raised (elevated).  Make sure to get enough sleep each night. General instructions  Apply a warm, moist washcloth to your face 3-4 times a day or as told by your  health care provider. This will help with discomfort.  Wash your hands often with soap and water to reduce your exposure to viruses and other germs. If soap and water are not available, use hand sanitizer.  Do not smoke. Avoid being around people who are smoking (secondhand smoke).  Keep all follow-up visits as told by your health care provider. This is important. Contact a health care provider if:  You have a fever.  Your symptoms get worse.  Your symptoms do not improve within 10 days. Get help right away if:  You have a severe headache.  You have persistent vomiting.  You have pain or swelling around your face or eyes.  You have vision problems.  You develop confusion.  Your neck is stiff.  You have trouble breathing. This information is not intended to replace advice given to you by your health care provider. Make sure you discuss any questions you have with your health care provider. Document Released: 11/13/2005 Document Revised: 07/09/2016 Document Reviewed: 09/08/2015 Elsevier Interactive Patient Education  2017 [employer] Acute Bronchitis, Adult Acute bronchitis is sudden (acute) swelling of the air tubes (bronchi) in the lungs. Acute bronchitis causes these tubes to fill with mucus, which can make it hard to breathe. It can also cause coughing or wheezing. In adults, acute bronchitis usually goes away within 2 weeks. A cough caused by bronchitis may last up to 3 weeks. Smoking, allergies, and asthma can make the condition worse. Repeated episodes of bronchitis may cause further lung problems, such as chronic obstructive pulmonary disease (COPD). What are the causes? This condition can be caused by germs and by substances that irritate the lungs, including:  Cold and flu viruses. This condition is most often caused by the same virus that causes a cold.  Bacteria.  Exposure to tobacco smoke, dust, fumes, and air pollution.  What increases the risk? This  condition is more likely to develop in people who:  Have close contact with someone with acute bronchitis.  Are exposed to lung irritants, such as tobacco smoke, dust, fumes, and vapors.  Have a weak immune system.  Have a respiratory condition such as asthma.  What are the signs or symptoms? Symptoms of this condition include:  A cough.  Coughing up clear, yellow, or green mucus.  Wheezing.  Chest congestion.  Shortness of breath.  A fever.  Body aches.  Chills.  A sore throat.  How is this diagnosed? This condition is usually diagnosed with a physical exam. During the exam, your health care provider may order tests,   such as chest X-rays, to rule out other conditions. He or she may also:  Test a sample of your mucus for bacterial infection.  Check the level of oxygen in your blood. This is done to check for pneumonia.  Do a chest X-ray or lung function testing to rule out pneumonia and other conditions.  Perform blood tests.  Your health care provider will also ask about your symptoms and medical history. How is this treated? Most cases of acute bronchitis clear up over time without treatment. Your health care provider may recommend:  Drinking more fluids. Drinking more makes your mucus thinner, which may make it easier to breathe.  Taking a medicine for a fever or cough.  Taking an antibiotic medicine.  Using an inhaler to help improve shortness of breath and to control a cough.  Using a cool mist vaporizer or humidifier to make it easier to breathe.  Follow these instructions at home: Medicines  Take over-the-counter and prescription medicines only as told by your health care provider.  If you were prescribed an antibiotic, take it as told by your health care provider. Do not stop taking the antibiotic even if you start to feel better. General instructions  Get plenty of rest.  Drink enough fluids to keep your urine clear or pale yellow.  Avoid  smoking and secondhand smoke. Exposure to cigarette smoke or irritating chemicals will make bronchitis worse. If you smoke and you need help quitting, ask your health care provider. Quitting smoking will help your lungs heal faster.  Use an inhaler, cool mist vaporizer, or humidifier as told by your health care provider.  Keep all follow-up visits as told by your health care provider. This is important. How is this prevented? To lower your risk of getting this condition again:  Wash your hands often with soap and water. If soap and water are not available, use hand sanitizer.  Avoid contact with people who have cold symptoms.  Try not to touch your hands to your mouth, nose, or eyes.  Make sure to get the flu shot every year.  Contact a health care provider if:  Your symptoms do not improve in 2 weeks of treatment. Get help right away if:  You cough up blood.  You have chest pain.  You have severe shortness of breath.  You become dehydrated.  You faint or keep feeling like you are going to faint.  You keep vomiting.  You have a severe headache.  Your fever or chills gets worse. This information is not intended to replace advice given to you by your health care provider. Make sure you discuss any questions you have with your health care provider. Document Released: 12/21/2004 Document Revised: 06/07/2016 Document Reviewed: 05/03/2016 Elsevier Interactive Patient Education  2017 [employer] Strep Throat Strep throat is a bacterial infection of the throat. Your health care provider may call the infection tonsillitis or pharyngitis, depending on whether there is swelling in the tonsils or at the back of the throat. Strep throat is most common during the cold months of the year in children who are 33-94 years of age, but it can happen during any season in people of any age. This infection is spread from person to person (contagious) through coughing, sneezing, or close  contact. What are the causes? Strep throat is caused by the bacteria called Streptococcus pyogenes. What increases the risk? This condition is more likely to develop in:  People who spend time in crowded places where the infection  can spread easily.  People who have close contact with someone who has strep throat.  What are the signs or symptoms? Symptoms of this condition include:  Fever or chills.  Redness, swelling, or pain in the tonsils or throat.  Pain or difficulty when swallowing.  White or yellow spots on the tonsils or throat.  Swollen, tender glands in the neck or under the jaw.  Red rash all over the body (rare).  How is this diagnosed? This condition is diagnosed by performing a rapid strep test or by taking a swab of your throat (throat culture test). Results from a rapid strep test are usually ready in a few minutes, but throat culture test results are available after one or two days. How is this treated? This condition is treated with antibiotic medicine. Follow these instructions at home: Medicines  Take over-the-counter and prescription medicines only as told by your health care provider.  Take your antibiotic as told by your health care provider. Do not stop taking the antibiotic even if you start to feel better.  Have family members who also have a sore throat or fever tested for strep throat. They may need antibiotics if they have the strep infection. Eating and drinking  Do not share food, drinking cups, or personal items that could cause the infection to spread to other people.  If swallowing is difficult, try eating soft foods until your sore throat feels better.  Drink enough fluid to keep your urine clear or pale yellow. General instructions  Gargle with a salt-water mixture 3-4 times per day or as needed. To make a salt-water mixture, completely dissolve -1 tsp of salt in 1 cup of warm water.  Make sure that all household members wash their  hands well.  Get plenty of rest.  Stay home from school or work until you have been taking antibiotics for 24 hours.  Keep all follow-up visits as told by your health care provider. This is important. Contact a health care provider if:  The glands in your neck continue to get bigger.  You develop a rash, cough, or earache.  You cough up a thick liquid that is green, yellow-brown, or bloody.  You have pain or discomfort that does not get better with medicine.  Your problems seem to be getting worse rather than better.  You have a fever. Get help right away if:  You have new symptoms, such as vomiting, severe headache, stiff or painful neck, chest pain, or shortness of breath.  You have severe throat pain, drooling, or changes in your voice.  You have swelling of the neck, or the skin on the neck becomes red and tender.  You have signs of dehydration, such as fatigue, dry mouth, and decreased urination.  You become increasingly sleepy, or you cannot wake up completely.  Your joints become red or painful. This information is not intended to replace advice given to you by your health care provider. Make sure you discuss any questions you have with your health care provider. Document Released: 11/10/2000 Document Revised: 07/12/2016 Document Reviewed: 03/08/2015 Else[employer] Interactive Patient Education  2017 [employer] Otitis Media With Effusion, Pediatric Otitis media with effusion (OME) occurs when there is inflammation of the middle ear and fluid in the middle ear space. There are no signs and symptoms of infection. The middle ear space contains air and the bones for hearing. Air in the middle ear space helps to transmit sound to the brain. OME is a common condition in  children, and it often occurs after an ear infection. This condition may be present for several weeks or longer after an ear infection. Most cases of this condition get better on their own. What are the  causes? OME is caused by a blockage of the eustachian tube in one or both ears. These tubes drain fluid in the ears to the back of the nose (nasopharynx). If the tissue in the tube swells up (edema), the tube closes. This prevents fluid from draining. Blockage can be caused by:  Ear infections.  Colds and other upper respiratory infections.  Allergies.  Irritants, such as tobacco smoke.  Enlarged adenoids. The adenoids are areas of soft tissue located high in the back of the throat, behind the nose and the roof of the mouth. They are part of the body's natural defense (immune) system.  A mass in the nasopharynx.  Damage to the ear caused by pressure changes (barotrauma).  What increases the risk? Your child is more likely to develop this condition if:  He or she has repeated ear and sinus infections.  He or she has allergies.  He or she is exposed to tobacco smoke.  He or she attends daycare.  He or she is not breastfed.  What are the signs or symptoms? Symptoms of this condition may not be obvious. Sometimes this condition does not have any symptoms, or symptoms may overlap with those of a cold or upper respiratory tract illness. Symptoms of this condition include:  Temporary hearing loss.  A feeling of fullness in the ear without pain.  Irritability or agitation.  Balance (vestibular) problems.  As a result of hearing loss, your child may:  Listen to the TV at a loud volume.  Not respond to questions.  Ask "What?" often when spoken to.  Mistake or confuse one sound or word for another.  Perform poorly at school.  Have a poor attention span.  Become agitated or irritated easily.  How is this diagnosed? This condition is diagnosed with an ear exam. Your child's health care provider will look inside your child's ear with an instrument (otoscope) to check for redness, swelling, and fluid. Other tests may be done, including:  A test to check the movement of  the eardrum (pneumatic otoscopy). This is done by squeezing a small amount of air into the ear.  A test that changes air pressure in the middle ear to check how well the eardrum moves and to see if the eustachian tube is working (tympanogram).  Hearing test (audiogram). This test involves playing tones at different pitches to see if your child can hear each tone.  How is this treated? Treatment for this condition depends on the cause. In many cases, the fluid goes away on its own. In some cases, your child may need a procedure to create a hole in the eardrum to allow fluid to drain (myringotomy) and to insert small drainage tubes (tympanostomy tubes) into the eardrums. These tubes help to drain fluid and prevent infection. This procedure may be recommended if:  OME does not get better over several months.  Your child has many ear infections within several months.  Your child has noticeable hearing loss.  Your child has problems with speech and language development.  Surgery may also be done to remove the adenoids (adenoidectomy). Follow these instructions at home:  Give over-the-counter and prescription medicines only as told by your child's health care provider.  Keep children away from any tobacco smoke.  Keep all  follow-up visits as told by your child's health care provider. This is important. How is this prevented?  Keep your child's vaccinations up to date. Make sure your child gets all recommended vaccinations, including a pneumonia and flu vaccine.  Encourage hand washing. Your child should wash his or her hands often with soap and water. If there is no soap and water, he or she should use hand sanitizer.  Avoid exposing your child to tobacco smoke.  Breastfeed your baby, if possible. Babies who are breastfed as long as possible are less likely to develop this condition. Contact a health care provider if:  Your child's hearing does not get better after 3 months.  Your  child's hearing is worse.  Your child has ear pain.  Your child has a fever.  Your child has drainage from the ear.  Your child is dizzy.  Your child has a lump on his or her neck. Get help right away if:  Your child has bleeding from the nose.  Your child cannot move part of her or his face.  Your child has trouble breathing.  Your child cannot smell.  Your child develops severe congestion.  Your child develops weakness.  Your child who is younger than 3 months has a temperature of 100F (38C) or higher. Summary  Otitis media with effusion (OME) occurs when there is inflammation of the middle ear and fluid in the middle ear space.  This condition is caused by blockage of one or both eustachian tubes, which drain fluid in the ears to the back of the nose.  Symptoms of this condition can include temporary hearing loss, a feeling of fullness in the ear, irritability or agitation, and balance (vestibular) problems. Sometimes, there are no symptoms.  This condition is diagnosed with an ear exam and tests, such as pneumatic otoscopy, tympanogram, and audiogram.  Treatment for this condition depends on the cause. In many cases, the fluid goes away on its own. This information is not intended to replace advice given to you by your health care provider. Make sure you discuss any questions you have with your health care provider. Document Released: 02/03/2004 Document Revised: 10/05/2016 Document Reviewed: 10/05/2016  Pharyngitis Pharyngitis is redness, pain, and swelling (inflammation) of your pharynx. What are the causes? Pharyngitis is usually caused by infection. Most of the time, these infections are from viruses (viral) and are part of a cold. However, sometimes pharyngitis is caused by bacteria (bacterial). Pharyngitis can also be caused by allergies. Viral pharyngitis may be spread from person to person by coughing, sneezing, and personal items or utensils (cups, forks,  spoons, toothbrushes). Bacterial pharyngitis may be spread from person to person by more intimate contact, such as kissing. What are the signs or symptoms? Symptoms of pharyngitis include:  Sore throat.  Tiredness (fatigue).  Low-grade fever.  Headache.  Joint pain and muscle aches.  Skin rashes.  Swollen lymph nodes.  Plaque-like film on throat or tonsils (often seen with bacterial pharyngitis).  How is this diagnosed? Your health care provider will ask you questions about your illness and your symptoms. Your medical history, along with a physical exam, is often all that is needed to diagnose pharyngitis. Sometimes, a rapid strep test is done. Other lab tests may also be done, depending on the suspected cause. How is this treated? Viral pharyngitis will usually get better in 3-4 days without the use of medicine. Bacterial pharyngitis is treated with medicines that kill germs (antibiotics). Follow these instructions at home:  Drink enough water and fluids to keep your urine clear or pale yellow.  Only take over-the-counter or prescription medicines as directed by your health care provider: ? If you are prescribed antibiotics, make sure you finish them even if you start to feel better. ? Do not take aspirin.  Get lots of rest.  Gargle with 8 oz of salt water ( tsp of salt per 1 qt of water) as often as every 1-2 hours to soothe your throat.  Throat lozenges (if you are not at risk for choking) or sprays may be used to soothe your throat. Contact a health care provider if:  You have large, tender lumps in your neck.  You have a rash.  You cough up green, yellow-brown, or bloody spit. Get help right away if:  Your neck becomes stiff.  You drool or are unable to swallow liquids.  You vomit or are unable to keep medicines or liquids down.  You have severe pain that does not go away with the use of recommended medicines.  You have trouble breathing (not caused by a  stuffy nose). This information is not intended to replace advice given to you by your health care provider. Make sure you discuss any questions you have with your health care provider. Document Released: 11/13/2005 Document Revised: 04/20/2016 Document Reviewed: 07/21/2013 Elsevier Interactive Patient Education  2017 [employer]. Risk analyst Patient Education  [employer].

## 2017-11-09 ENCOUNTER — Ambulatory Visit: Payer: Self-pay | Admitting: Registered Nurse

## 2017-11-09 VITALS — BP 123/90 | HR 81 | Temp 98.1°F

## 2017-11-09 DIAGNOSIS — M7631 Iliotibial band syndrome, right leg: Secondary | ICD-10-CM

## 2017-11-09 DIAGNOSIS — M7652 Patellar tendinitis, left knee: Secondary | ICD-10-CM

## 2017-11-09 DIAGNOSIS — M7651 Patellar tendinitis, right knee: Secondary | ICD-10-CM

## 2017-11-09 NOTE — Patient Instructions (Addendum)
Outer Hip Stretch: Reclined IT Band Stretch (Strap)    Strap around opposite foot, pull across only as far as possible with shoulders on mat. Hold for ____ breaths. Repeat ____ times each leg.  Copyright  VHI. All rights reserved.  Kneeling Lateral Trunk / IT Band Stretch    Lie on side over ball, leg near ball in kneel, other leg straight. Reach over head, keeping both legs on floor. Hold ___ seconds. Do ___ sets of ___ repetitions. Advanced: Hyperextend top arm to increase stretch on IT band.  Copyright  VHI. All rights reserved.  IT Band: Wall Lean With Crossed Leg    Stand with left hand on wall. Cross right leg behind other leg. Stretch hip toward wall with other arm supporting trunk. Hold ___ seconds. Relax. Repeat ___ times. Do ___ times a day. Repeat on other side.  Copyright  VHI. All rights reserved.  IT Band: Wall Lean    Stand with left hand on wall. Lean hip toward wall with other arm supporting trunk. Hold ___ seconds. Relax. Repeat ___ times. Do ___ times a day. Repeat on other side.  Copyright  VHI. All rights reserved.  IT Band: Side Push-Up    Lie on left side. Push up onto hands. Bend top leg to stabilize other leg. Hold ___ seconds. Relax. Repeat ___ times. Do ___ times a day. Repeat on other side.    Copyright  VHI. All rights reserved.  IT Band: Leg Hang (Side-Lying)    Lie on side with right leg on top. Keep hip and knee straight. Move top leg behind and hang over edge. Hold ___ seconds. Relax. Repeat ___ times. Do ___ times a day. Repeat on other side.    Copyright  VHI. All rights reserved.  IT Band: Knee Across Body    Lie on back, shoulders on surface. Stretch right knee up and across body. Hold ___ seconds. Relax. Repeat ___ times. Do ___ times a day. Repeat with other leg.    Copyright  VHI. All rights reserved.  Patellar Tendinitis Rehab Ask your health care provider which exercises are safe for you. Do exercises  exactly as told by your health care provider and adjust them as directed. It is normal to feel mild stretching, pulling, tightness, or discomfort as you do these exercises, but you should stop right away if you feel sudden pain or your pain gets worse.Do not begin these exercises until told by your health care provider. Stretching and range of motion exercises This exercise warms up your muscles and joints and improves the movement and flexibility of your knee. This exercise also helps to relieve pain and stiffness. Exercise A: Hamstring, doorway  1. Lie on your back in front of a doorway with your __________ leg resting against the wall and your other leg flat on the floor in the doorway. There should be a slight bend in your __________ knee. 2. Straighten your __________ knee. You should feel a stretch behind your knee or thigh. If you do not, scoot your buttocks closer to the door. 3. Hold this position for __________ seconds. Repeat __________ times. Complete this stretch __________ times a day. Strengthening exercises These exercises build strength and endurance in your knee. Endurance is the ability to use your muscles for a long time, even after they get tired. Exercise B: Quadriceps, isometric  1. Lie on your back with your __________ leg extended and your other knee bent. 2. Slowly tense the muscles in the front  of your __________ thigh. When you do this, you should see your kneecap slide up toward your hip or see increased dimpling just above the knee. This motion will push the back of your knee toward the floor. If this is painful, try putting a rolled-up hand towel under your knee to support it in a bent position. Change the size of the towel to find a position that allows you to do this exercise without any pain. 3. For __________ seconds, hold the muscle as tight as you can without increasing your pain. 4. Relax the muscles slowly and completely. Repeat __________ times. Complete this  exercise __________ times a day. Exercise C: Straight leg raises ( quadriceps) 1. Lie on your back with your __________ leg extended and your other knee bent. 2. Tense the muscles in the front of your __________ thigh. When you do this, you should see your kneecap slide up or see increased dimpling just above the knee. 3. Keep these muscles tight as you raise your leg 4-6 inches (10-15 cm) off the floor. Do not let your moving knee bend. 4. Hold this position for __________ seconds. 5. Keep these muscles tense as you slowly lower your leg. 6. Relax your muscles slowly and completely. Repeat __________ times. Complete this exercise __________ times a day. Exercise D: Squats 1. Stand in front of a table, with your feet and knees pointing straight ahead. You may rest your hands on the table for balance but not for support. 2. Slowly bend your knees and lower your hips like you are going to sit in a chair. ? Keep your weight over your heels, not over your toes. ? Keep your lower legs upright so they are parallel with the table legs. ? Do not let your hips go lower than your knees. ? Do not bend lower than told by your health care provider. ? If your knee pain increases, do not bend as low. 3. Hold the squat position for __________ seconds. 4. Slowly push with your legs to return to standing. Do not use your hands to pull yourself to standing. Repeat __________ times. Complete this exercise __________ times a day. Exercise E: Step-downs 1. Stand on the edge of a step. 2. Keeping your weight over your __________ heel, slowly bend your __________ knee to bring your __________ heel toward the floor. Lower your heel as far as you can while keeping control and without increasing any discomfort. ? Do not let your __________ knee come forward. ? Use your leg muscles, not gravity, to lower your body. ? Hold a wall or rail for balance if needed. 3. Slowly push through your heel to lift your body weight  back up. 4. Return to the starting position. Repeat __________ times. Complete this exercise __________ times a day. Exercise F: Straight leg raises ( hip abductors) 1. Lie on your side with your __________ leg in the top position. Lie so your head, shoulder, knee, and hip line up. You may bend your lower knee to help you keep your balance. 2. Roll your hips slightly forward, so that your hips are stacked directly over each other and your __________ knee is facing forward. 3. Leading with your heel, lift your top leg 4-6 inches (10-15 cm). You should feel the muscles in your outer hip lifting. ? Do not let your foot drift forward. ? Do not let your knee roll toward the ceiling. 4. Hold this position for __________ seconds. 5. Slowly lower your leg to the starting  position. 6. Let your muscles relax completely after each repetition. Repeat __________ times. Complete this exercise __________ times a day. This information is not intended to replace advice given to you by your health care provider. Make sure you discuss any questions you have with your health care provider. Document Released: 11/13/2005 Document Revised: 07/20/2016 Document Reviewed: 08/17/2015 Elsevier Interactive Patient Education  2018 [employer].   Elastic Bandage and RICE What does an elastic bandage do? Elastic bandages come in different shapes and sizes. They generally provide support to your injury and reduce swelling while you are healing, but they can perform different functions. Your health care provider will help you to decide what is best for your protection, recovery, or rehabilitation following an injury. What are some general tips for using an elastic bandage?  Use the bandage as directed by the maker of the bandage that you are using.  Do not wrap the bandage too tightly. This may cut off the circulation in the arm or leg in the area below the bandage. ? If part of your body beyond the bandage becomes blue,  numb, cold, swollen, or is more painful, your bandage is most likely too tight. If this occurs, remove your bandage and reapply it more loosely.  See your health care provider if the bandage seems to be making your problems worse rather than better.  An elastic bandage should be removed and reapplied every 3-4 hours or as directed by your health care provider. What is RICE? The routine care of many injuries includes rest, ice, compression, and elevation (RICE therapy). Rest Rest is required to allow your body to heal. Generally, you can resume your routine activities when you are comfortable and have been given permission by your health care provider. Ice Icing your injury helps to keep the swelling down and it reduces pain. Do not apply ice directly to your skin.  Put ice in a plastic bag.  Place a towel between your skin and the bag.  Leave the ice on for 20 minutes, 2-3 times per day.  Do this for as long as you are directed by your health care provider. Compression Compression helps to keep swelling down, gives support, and helps with discomfort. Compression may be done with an elastic bandage. Elevation Elevation helps to reduce swelling and it decreases pain. If possible, your injured area should be placed at or above the level of your heart or the center of your chest. When should I seek medical care? You should seek medical care if:  You have persistent pain and swelling.  Your symptoms are getting worse rather than improving.  These symptoms may indicate that further evaluation or further X-rays are needed. Sometimes, X-rays may not show a small broken bone (fracture) until a number of days later. Make a follow-up appointment with your health care provider. Ask when your X-ray results will be ready. Make sure that you get your X-ray results. When should I seek immediate medical care? You should seek immediate medical care if:  You have a sudden onset of severe pain at or  below the area of your injury.  You develop redness or increased swelling around your injury.  You have tingling or numbness at or below the area of your injury that does not improve after you remove the elastic bandage.  This information is not intended to replace advice given to you by your health care provider. Make sure you discuss any questions you have with your health care provider. Document  Released: 05/05/2002 Document Revised: 10/09/2016 Document Reviewed: 06/29/2014 Elsevier Interactive Patient Education  2017 [employer].
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
## 2017-11-10 MED ORDER — ACETAMINOPHEN 500 MG PO TABS: 1000.0000 mg | ORAL_TABLET | Freq: Four times a day (QID) | ORAL | 0 refills | Status: AC | PRN

## 2017-12-18 ENCOUNTER — Ambulatory Visit: Payer: Self-pay | Admitting: Registered Nurse

## 2017-12-18 VITALS — BP 131/96 | HR 88 | Temp 98.9°F

## 2017-12-18 DIAGNOSIS — G5602 Carpal tunnel syndrome, left upper limb: Secondary | ICD-10-CM

## 2017-12-18 DIAGNOSIS — Z6841 Body Mass Index (BMI) 40.0 and over, adult: Secondary | ICD-10-CM

## 2017-12-18 DIAGNOSIS — R030 Elevated blood-pressure reading, without diagnosis of hypertension: Secondary | ICD-10-CM

## 2017-12-18 NOTE — Patient Instructions (Addendum)
Hypertension Hypertension, commonly called high blood pressure, is when the force of blood pumping through the arteries is too strong. The arteries are the blood vessels that carry blood from the heart throughout the body. Hypertension forces the heart to work harder to pump blood and may cause arteries to become narrow or stiff. Having untreated or uncontrolled hypertension can cause heart attacks, strokes, kidney disease, and other problems. A blood pressure reading consists of a higher number over a lower number. Ideally, your blood pressure should be below 120/80. The first ("top") number is called the systolic pressure. It is a measure of the pressure in your arteries as your heart beats. The second ("bottom") number is called the diastolic pressure. It is a measure of the pressure in your arteries as the heart relaxes. What are the causes? The cause of this condition is not known. What increases the risk? Some risk factors for high blood pressure are under your control. Others are not. Factors you can change  Smoking.  Having type 2 diabetes mellitus, high cholesterol, or both.  Not getting enough exercise or physical activity.  Being overweight.  Having too much fat, sugar, calories, or salt (sodium) in your diet.  Drinking too much alcohol. Factors that are difficult or impossible to change  Having chronic kidney disease.  Having a family history of high blood pressure.  Age. Risk increases with age.  Race. You may be at higher risk if you are African-American.  Gender. Men are at higher risk than women before age 45. After age 65, women are at higher risk than men.  Having obstructive sleep apnea.  Stress. What are the signs or symptoms? Extremely high blood pressure (hypertensive crisis) may cause:  Headache.  Anxiety.  Shortness of breath.  Nosebleed.  Nausea and vomiting.  Severe chest pain.  Jerky movements you cannot control (seizures).  How is this  diagnosed? This condition is diagnosed by measuring your blood pressure while you are seated, with your arm resting on a surface. The cuff of the blood pressure monitor will be placed directly against the skin of your upper arm at the level of your heart. It should be measured at least twice using the same arm. Certain conditions can cause a difference in blood pressure between your right and left arms. Certain factors can cause blood pressure readings to be lower or higher than normal (elevated) for a short period of time:  When your blood pressure is higher when you are in a health care provider's office than when you are at home, this is called white coat hypertension. Most people with this condition do not need medicines.  When your blood pressure is higher at home than when you are in a health care provider's office, this is called masked hypertension. Most people with this condition may need medicines to control blood pressure.  If you have a high blood pressure reading during one visit or you have normal blood pressure with other risk factors:  You may be asked to return on a different day to have your blood pressure checked again.  You may be asked to monitor your blood pressure at home for 1 week or longer.  If you are diagnosed with hypertension, you may have other blood or imaging tests to help your health care provider understand your overall risk for other conditions. How is this treated? This condition is treated by making healthy lifestyle changes, such as eating healthy foods, exercising more, and reducing your alcohol intake. Your   health care provider may prescribe medicine if lifestyle changes are not enough to get your blood pressure under control, and if:  Your systolic blood pressure is above 130.  Your diastolic blood pressure is above 80.  Your personal target blood pressure may vary depending on your medical conditions, your age, and other factors. Follow these  instructions at home: Eating and drinking  Eat a diet that is high in fiber and potassium, and low in sodium, added sugar, and fat. An example eating plan is called the DASH (Dietary Approaches to Stop Hypertension) diet. To eat this way: ? Eat plenty of fresh fruits and vegetables. Try to fill half of your plate at each meal with fruits and vegetables. ? Eat whole grains, such as whole wheat pasta, brown rice, or whole grain bread. Fill about one quarter of your plate with whole grains. ? Eat or drink low-fat dairy products, such as skim milk or low-fat yogurt. ? Avoid fatty cuts of meat, processed or cured meats, and poultry with skin. Fill about one quarter of your plate with lean proteins, such as fish, chicken without skin, beans, eggs, and tofu. ? Avoid premade and processed foods. These tend to be higher in sodium, added sugar, and fat.  Reduce your daily sodium intake. Most people with hypertension should eat less than 1,500 mg of sodium a day.  Limit alcohol intake to no more than 1 drink a day for nonpregnant women and 2 drinks a day for men. One drink equals 12 oz of beer, 5 oz of wine, or 1 oz of hard liquor. Lifestyle  Work with your health care provider to maintain a healthy body weight or to lose weight. Ask what an ideal weight is for you.  Get at least 30 minutes of exercise that causes your heart to beat faster (aerobic exercise) most days of the week. Activities may include walking, swimming, or biking.  Include exercise to strengthen your muscles (resistance exercise), such as pilates or lifting weights, as part of your weekly exercise routine. Try to do these types of exercises for 30 minutes at least 3 days a week.  Do not use any products that contain nicotine or tobacco, such as cigarettes and e-cigarettes. If you need help quitting, ask your health care provider.  Monitor your blood pressure at home as told by your health care provider.  Keep all follow-up visits as  told by your health care provider. This is important. Medicines  Take over-the-counter and prescription medicines only as told by your health care provider. Follow directions carefully. Blood pressure medicines must be taken as prescribed.  Do not skip doses of blood pressure medicine. Doing this puts you at risk for problems and can make the medicine less effective.  Ask your health care provider about side effects or reactions to medicines that you should watch for. Contact a health care provider if:  You think you are having a reaction to a medicine you are taking.  You have headaches that keep coming back (recurring).  You feel dizzy.  You have swelling in your ankles.  You have trouble with your vision. Get help right away if:  You develop a severe headache or confusion.  You have unusual weakness or numbness.  You feel faint.  You have severe pain in your chest or abdomen.  You vomit repeatedly.  You have trouble breathing. Summary  Hypertension is when the force of blood pumping through your arteries is too strong. If this condition is not   controlled, it may put you at risk for serious complications.  Your personal target blood pressure may vary depending on your medical conditions, your age, and other factors. For most people, a normal blood pressure is less than 120/80.  Hypertension is treated with lifestyle changes, medicines, or a combination of both. Lifestyle changes include weight loss, eating a healthy, low-sodium diet, exercising more, and limiting alcohol. This information is not intended to replace advice given to you by your health care provider. Make sure you discuss any questions you have with your health care provider. Document Released: 11/13/2005 Document Revised: 10/11/2016 Document Reviewed: 10/11/2016 Elsevier Interactive Patient Education  2018 [employer] Obesity, Adult Obesity is the condition of having too much total body fat. Being  overweight or obese means that your weight is greater than what is considered healthy for your body size. Obesity is determined by a measurement called BMI. BMI is an estimate of body fat and is calculated from height and weight. For adults, a BMI of 30 or higher is considered obese. Obesity can eventually lead to other health concerns and major illnesses, including:  Stroke.  Coronary artery disease (CAD).  Type 2 diabetes.  Some types of cancer, including cancers of the colon, breast, uterus, and gallbladder.  Osteoarthritis.  High blood pressure (hypertension).  High cholesterol.  Sleep apnea.  Gallbladder stones.  Infertility problems.  What are the causes? The main cause of obesity is taking in (consuming) more calories than your body uses for energy. Other factors that contribute to this condition may include:  Being born with genes that make you more likely to become obese.  Having a medical condition that causes obesity. These conditions include: ? Hypothyroidism. ? Polycystic ovarian syndrome (PCOS). ? Binge-eating disorder. ? Cushing syndrome.  Taking certain medicines, such as steroids, antidepressants, and seizure medicines.  Not being physically active (sedentary lifestyle).  Living where there are limited places to exercise safely or buy healthy foods.  Not getting enough sleep.  What increases the risk? The following factors may increase your risk of this condition:  Having a family history of obesity.  Being a woman of African-American descent.  Being a man of Hispanic descent.  What are the signs or symptoms? Having excessive body fat is the main symptom of this condition. How is this diagnosed? This condition may be diagnosed based on:  Your symptoms.  Your medical history.  A physical exam. Your health care provider may measure: ? Your BMI. If you are an adult with a BMI between 25 and less than 30, you are considered overweight. If you  are an adult with a BMI of 30 or higher, you are considered obese. ? The distances around your hips and your waist (circumferences). These may be compared to each other to help diagnose your condition. ? Your skinfold thickness. Your health care provider may gently pinch a fold of your skin and measure it.  How is this treated? Treatment for this condition often includes changing your lifestyle. Treatment may include some or all of the following:  Dietary changes. Work with your health care provider and a dietitian to set a weight-loss goal that is healthy and reasonable for you. Dietary changes may include eating: ? Smaller portions. A portion size is the amount of a particular food that is healthy for you to eat at one time. This varies from person to person. ? Low-calorie or low-fat options. ? More whole grains, fruits, and vegetables.  Regular physical activity. This may  include aerobic activity (cardio) and strength training.  Medicine to help you lose weight. Your health care provider may prescribe medicine if you are unable to lose 1 pound a week after 6 weeks of eating more healthily and doing more physical activity.  Surgery. Surgical options may include gastric banding and gastric bypass. Surgery may be done if: ? Other treatments have not helped to improve your condition. ? You have a BMI of 40 or higher. ? You have life-threatening health problems related to obesity.  Follow these instructions at home:  Eating and drinking   Follow recommendations from your health care provider about what you eat and drink. Your health care provider may advise you to: ? Limit fast foods, sweets, and processed snack foods. ? Choose low-fat options, such as low-fat milk instead of whole milk. ? Eat 5 or more servings of fruits or vegetables every day. ? Eat at home more often. This gives you more control over what you eat. ? Choose healthy foods when you eat out. ? Learn what a healthy  portion size is. ? Keep low-fat snacks on hand. ? Avoid sugary drinks, such as soda, fruit juice, iced tea sweetened with sugar, and flavored milk. ? Eat a healthy breakfast.  Drink enough water to keep your urine clear or pale yellow.  Do not go without eating for long periods of time (do not fast) or follow a fad diet. Fasting and fad diets can be unhealthy and even dangerous. Physical Activity  Exercise regularly, as told by your health care provider. Ask your health care provider what types of exercise are safe for you and how often you should exercise.  Warm up and stretch before being active.  Cool down and stretch after being active.  Rest between periods of activity. Lifestyle  Limit the time that you spend in front of your TV, computer, or video game system.  Find ways to reward yourself that do not involve food.  Limit alcohol intake to no more than 1 drink a day for nonpregnant women and 2 drinks a day for men. One drink equals 12 oz of beer, 5 oz of wine, or 1 oz of hard liquor. General instructions  Keep a weight loss journal to keep track of the food you eat and how much you exercise you get.  Take over-the-counter and prescription medicines only as told by your health care provider.  Take vitamins and supplements only as told by your health care provider.  Consider joining a support group. Your health care provider may be able to recommend a support group.  Keep all follow-up visits as told by your health care provider. This is important. Contact a health care provider if:  You are unable to meet your weight loss goal after 6 weeks of dietary and lifestyle changes. This information is not intended to replace advice given to you by your health care provider. Make sure you discuss any questions you have with your health care provider. Document Released: 12/21/2004 Document Revised: 04/17/2016 Document Reviewed: 09/01/2015 Elsevier Interactive Patient Education   2018 [employer] Cryotherapy WHAT IS CRYOTHERAPY? Cryotherapy, or cold therapy, is a treatment that uses cold temperatures to treat an injury or medical condition. It includes using cold packs or ice packs to reduce pain and swelling. WHO SHOULD NOT USE CRYOTHERAPY? Cryotherapy is not safe for people who cannot tell you if they are in pain, such as small children and people who have dementia. Cryotherapy is also not safe for people  with certain conditions, such as:  Raynaud phenomenon.  Cold hypersensitivity.  Numbness or loss of feeling in the area being iced.  Cryotherapy may or may not be safe for people with certain other conditions. Do not use cryotherapy without your health care provider's approval if you have:  A heart condition.  High blood pressure.  Open or healing wounds.  An infection.  Rheumatoid arthritis.  Poor circulation.  Diabetes.  Certain skin conditions.  HOW DO I USE CRYOTHERAPY? To use cryotherapy at home to reduce pain and swelling:  Place a towel between the cold source and your skin.  Apply the cold source for no more than 20 minutes at a time.  Check your skin after 5 minutes to make sure there are no signs of a poor response to cold or skin damage. Check for: ? White spots on your skin. Your skin may look blotchy or mottled. ? Skin that looks blue or pale. ? Skin that feels waxy or hard.  Repeat these steps as many times each day as told by your health care provider.  HOW CAN I MAKE A COLD PACK? When using a cold pack at home to reduce pain and swelling, you can use:  A silica gel cold pack that has been left in the freezer. You can buy this online or in stores.  A plastic bag of frozen vegetables.  A sealable plastic bag that has been filled with crushed ice.  Always wrap the pack in a dry or damp towel to avoid direct contact with your skin. WHEN SHOULD I CALL MY HEALTH CARE PROVIDER? Call your health care provider if:  You  develop white spots on your skin. This may give your skin a blotchy or mottled look.  Your skin turns blue or pale.  Your skin becomes waxy or hard.  Your swelling gets worse.  This information is not intended to replace advice given to you by your health care provider. Make sure you discuss any questions you have with your health care provider. Document Released: 07/10/2011 Document Revised: 04/20/2016 Document Reviewed: 07/28/2015 Elsevier Interactive Patient Education  2017 [employer]. Carpal Tunnel Syndrome Carpal tunnel syndrome is a condition that causes pain in your hand and arm. The carpal tunnel is a narrow area located on the palm side of your wrist. Repeated wrist motion or certain diseases may cause swelling within the tunnel. This swelling pinches the main nerve in the wrist (median nerve). What are the causes? This condition may be caused by:  Repeated wrist motions.  Wrist injuries.  Arthritis.  A cyst or tumor in the carpal tunnel.  Fluid buildup during pregnancy.  Sometimes the cause of this condition is not known. What increases the risk? This condition is more likely to develop in:  People who have jobs that cause them to repeatedly move their wrists in the same motion, such as Health visitor.  Women.  People with certain conditions, such as: ? Diabetes. ? Obesity. ? An underactive thyroid (hypothyroidism). ? Kidney failure.  What are the signs or symptoms? Symptoms of this condition include:  A tingling feeling in your fingers, especially in your thumb, index, and middle fingers.  Tingling or numbness in your hand.  An aching feeling in your entire arm, especially when your wrist and elbow are bent for long periods of time.  Wrist pain that goes up your arm to your shoulder.  Pain that goes down into your palm or fingers.  A weak feeling in  your hands. You may have trouble grabbing and holding items.  Your symptoms may feel worse  during the night. How is this diagnosed? This condition is diagnosed with a medical history and physical exam. You may also have tests, including:  An electromyogram (EMG). This test measures electrical signals sent by your nerves into the muscles.  X-rays.  How is this treated? Treatment for this condition includes:  Lifestyle changes. It is important to stop doing or modify the activity that caused your condition.  Physical or occupational therapy.  Medicines for pain and inflammation. This may include medicine that is injected into your wrist.  A wrist splint.  Surgery.  Follow these instructions at home: If you have a splint:  Wear it as told by your health care provider. Remove it only as told by your health care provider.  Loosen the splint if your fingers become numb and tingle, or if they turn cold and blue.  Keep the splint clean and dry. General instructions  Take over-the-counter and prescription medicines only as told by your health care provider.  Rest your wrist from any activity that may be causing your pain. If your condition is work related, talk to your employer about changes that can be made, such as getting a wrist pad to use while typing.  If directed, apply ice to the painful area: ? Put ice in a plastic bag. ? Place a towel between your skin and the bag. ? Leave the ice on for 20 minutes, 2-3 times per day.  Keep all follow-up visits as told by your health care provider. This is important.  Do any exercises as told by your health care provider, physical therapist, or occupational therapist. Contact a health care provider if:  You have new symptoms.  Your pain is not controlled with medicines.  Your symptoms get worse. This information is not intended to replace advice given to you by your health care provider. Make sure you discuss any questions you have with your health care provider. Document Released: 11/10/2000 Document Revised: 03/23/2016  Document Reviewed: 03/31/2015 Elsevier Interactive Patient Education  2018 [employer] CARPAL TUNNEL (Nerve Compression Syndrome): Wrist Stretch    Extend right arm with fingers facing down. With left hand, gently pull fingers of right hand toward body. Hold position for ___ breaths. Repeat with arms switched. Repeat ___ times, alternating arms. Do ___ times per day.  Copyright  VHI. All rights reserved.  CARPAL TUNNEL (Nerve Compression Syndrome): Wrist Rotation    Make a loose fist with each hand and rotate at the wrists in one direction ___ times. Repeat in other direction. Do ___ times per day.  Copyright  VHI. All rights reserved.  CARPAL TUNNEL (Nerve Compression Syndrome): Puppy Stretch (Kneeling)    Inhaling, slide arms forward, bringing hips over knees and chest toward floor. Rest forehead on the mat or turn head to one side. Hold position for ___ breaths. Repeat ___ times. Do ___ times per day.  Copyright  VHI. All rights reserved.  CARPAL TUNNEL (Nerve Compression Syndrome): Hug    Cross right arm over left and walk hands behind shoulders. Squeeze shoulders and hold position for ___ breaths. Switch arms and repeat. Repeat ___ times, alternating arms. Do ___ times per day.  Copyright  VHI. All rights reserved.  CARPAL TUNNEL (Nerve Compression Syndrome): Horizontal Adductor Stretch (Standing)    Place right hand on wall. Inhaling, turn torso toward left. Hold position for ___ breaths. Repeat ___ times. Repeat with left  hand on wall. Do ___ times per day.  Copyright  VHI. All rights reserved.  CARPAL TUNNEL (Nerve Compression Syndrome): Eagle Arms    Bring right arm in front, elbow bent, forearm up. Reach left arm under right and, if possible, bring left fingers into right palm, thumbs facing body. (If not able to bring fingers into palm, just hold position where comfortable.) Hold position for ___ breaths. Switch arms and repeat. Repeat ___ times. Do  ___ times per day.  Copyright  VHI. All rights reserved.  Wrist Splint A splint is a medical device that keeps an injured part of your body from moving. A splint supports your wrist like a cast, but it is more flexible. It can be removed or loosened. The supporting part of a splint does not completely surround your wrist. It is held in place with an elastic band or straps. You may need a wrist splint if you have hurt your wrist or if you have a condition that causes swelling. Depending on the type of wrist problem you have, your splint may extend up your arm, onto your hand, or around your thumb. The wrist splint may be worn to:  Support your wrist.  Protect your injury.  Prevent further injury.  Prevent movement.  Reduce pain.  Promote healing. RISKS AND COMPLICATIONS The most dangerous complication of wearing a splint is having a reduced blood supply to your wrist or hand. This can happen if there is a lot of swelling or if the splint is too tight. This results in a condition called compartment syndrome and can cause permanent damage. Symptoms include:  Pain that is getting worse.  Tingling and numbness.  Changes in skin color (paleness or a bluish color).  Cold fingers. Other complications of wearing a splint can include:   Skin irritation that can cause: ? Itching. ? Rash. ? Skin sores. ? Skin infection.  Wrist stiffness. This can occur if you have worn a splint for a long time. HOW TO USE YOUR WRIST SPLINT Your wrist splint should be tight enough to support your wrist without cutting off your blood supply. How long you need to wear the splint depends on the type of wrist problem you have. Your health care provider will instruct you on how to wear your wrist splint and for how long.  Follow all your health care provider's instructions.  Take medicine only as directed by your health care provider.  Keep your wrist elevated above the level of your heart while  resting.  Ice may help reduce pain and swelling. ? Place ice in a plastic bag. ? Place a towel between your splint and the bag. ? Leave the ice on for 20 minutes, 2-3 times a day.  Do not get your splint wet.  Do not push objects under your splint to scratch your skin.  Loosen your splint if it feels too tight. Consult your health care provider if you have questions about how tight to wear the splint.  Keep all follow-up visits as directed by your health care provider. This is important. SEEK MEDICAL CARE IF:  You have wrist pain or swelling that does not go away.  The skin around or under your splint becomes red, itchy, or moist.  You have chills or fever.  Your splint feels too tight or too loose.  Your splint gets damaged. SEEK IMMEDIATE MEDICAL CARE IF: You have symptoms of compartment syndrome. These include:   Pain that is getting worse.  Tingling and  numbness.  Changes in skin color (paleness or a bluish color).  Cold fingers. MAKE SURE YOU:  Understand these instructions.  Will watch your condition.  Will get help right away if you are not doing well or get worse. This information is not intended to replace advice given to you by your health care provider. Make sure you discuss any questions you have with your health care provider. Document Released: 10/26/2006 Document Revised: 12/04/2014 Document Reviewed: 02/24/2014 Elsevier Interactive Patient Education  2017 [employer].

## 2017-12-18 NOTE — Progress Notes (Signed)
Subjective:    Patient ID: Stephanie Chandler, female    DOB: Apr 04, 1966, 52 y.o.   MRN: [id number]  51y/o african american female established Pt reports recurrence of L wrist/hand pain-tendonitis. Middle of palm with tingling numbness intermittent and if she doesn't wear brace at night sleep interrupted. Last seen in July 2018 for same. Denies acute injury. Does repetitive motions at work. This flare has been occurring for approx 1 week. Wearing wrist brace that she was given last time when she can mostly at night because working with wax when working on [employer] and impedes her computer work/typing.  Patient hunts and pecks does not type with all fingers.  Right hand dominant and has had right shoulder tendonitis/injury compensating using right. It impedes some movement that she must perform for job. Taking ibuprofen 600mg  BID does not elevate or ice it.   Reports weight gain over the holidays and hasn't been exercising.  Denied chest pain, SOB, headache, visual changes, swelling hands/wrist, trauma, bruising.      Review of Systems  Constitutional: Negative for activity change, appetite change, chills, diaphoresis, fatigue and fever.  HENT: Negative for congestion, trouble swallowing and voice change.   Eyes: Negative for pain and discharge.  Respiratory: Negative for cough, chest tightness, shortness of breath, wheezing and stridor.   Cardiovascular: Negative for chest pain and leg swelling.  Gastrointestinal: Negative for blood in stool, constipation, diarrhea, nausea and vomiting.  Genitourinary: Negative for difficulty urinating.  Musculoskeletal: Positive for arthralgias and myalgias. Negative for back pain, gait problem, joint swelling, neck pain and neck stiffness.  Skin: Negative for color change, rash and wound.  Allergic/Immunologic: Negative for environmental allergies and food allergies.  Neurological: Positive for numbness. Negative for dizziness, tremors, seizures, syncope, facial  asymmetry, speech difficulty, light-headedness and headaches.  Hematological: Negative for adenopathy. Does not bruise/bleed easily.  Psychiatric/Behavioral: Positive for sleep disturbance. Negative for agitation and confusion. The patient is not nervous/anxious.        Objective:   Physical Exam  Constitutional: She is oriented to person, place, and time. She appears well-developed and well-nourished. She is active and cooperative.  Non-toxic appearance. She does not have a sickly appearance. She does not appear ill. No distress.  HENT:  Head: Normocephalic and atraumatic.  Right Ear: Hearing and external ear normal.  Left Ear: Hearing and external ear normal.  Nose: No rhinorrhea, nose lacerations, nasal deformity, septal deviation or nasal septal hematoma. No epistaxis.  No foreign bodies.  Mouth/Throat: Uvula is midline and mucous membranes are normal. Mucous membranes are not pale, not dry and not cyanotic. She does not have dentures. No oral lesions. No trismus in the jaw. Normal dentition. No dental abscesses, uvula swelling, lacerations or dental caries. No oropharyngeal exudate, posterior oropharyngeal edema, posterior oropharyngeal erythema or tonsillar abscesses.  Eyes: Conjunctivae, EOM and lids are normal. Pupils are equal, round, and reactive to light. Right eye exhibits no chemosis, no discharge, no exudate and no hordeolum. No foreign body present in the right eye. Left eye exhibits no chemosis, no discharge, no exudate and no hordeolum. No foreign body present in the left eye. Right conjunctiva is not injected. Right conjunctiva has no hemorrhage. Left conjunctiva is not injected. Left conjunctiva has no hemorrhage. No scleral icterus. Right eye exhibits normal extraocular motion and no nystagmus. Left eye exhibits normal extraocular motion and no nystagmus. Right pupil is round and reactive. Left pupil is round and reactive. Pupils are equal.  Neck: Trachea normal, normal range  of  motion and phonation normal. Neck supple. No tracheal tenderness and no muscular tenderness present. No neck rigidity. No tracheal deviation, no edema, no erythema and normal range of motion present. No thyroid mass and no thyromegaly present.  Cardiovascular: Normal rate, regular rhythm, S1 normal, S2 normal, normal heart sounds and intact distal pulses. PMI is not displaced. Exam reveals no gallop and no friction rub.  No murmur heard. Pulses:      Radial pulses are 2+ on the right side, and 2+ on the left side.  Pulmonary/Chest: Effort normal and breath sounds normal. No accessory muscle usage or stridor. No respiratory distress. She has no decreased breath sounds. She has no wheezes. She has no rhonchi. She has no rales. She exhibits no tenderness.  Abdominal: Soft. Normal appearance. She exhibits no distension, no fluid wave and no ascites. There is no rigidity and no guarding.  Musculoskeletal: Normal range of motion. She exhibits no edema, tenderness or deformity.       Right shoulder: Normal.       Left shoulder: Normal.       Right elbow: Normal.      Left elbow: Normal.       Right wrist: Normal.       Left wrist: Normal.       Right hip: Normal.       Left hip: Normal.       Right knee: Normal.       Left knee: Normal.       Cervical back: Normal.       Thoracic back: Normal.       Lumbar back: Normal.       Right forearm: Normal.       Left forearm: Normal.       Right hand: Normal.       Left hand: Normal. She exhibits normal range of motion, no tenderness, no bony tenderness, normal two-point discrimination, normal capillary refill, no deformity, no laceration and no swelling. Normal sensation noted. Decreased sensation is not present in the ulnar distribution, is not present in the medial redistribution and is not present in the radial distribution. Normal strength noted. She exhibits no finger abduction, no thumb/finger opposition and no wrist extension trouble.  Strength  equal upper and lower extremities bilaterally 5/5 negative tinnel's and phalens tests bilaterally; bilateral hand grasp equal no edema/rash/TTP; on/off exam table/in/out of chair without difficulty gait sure and steady in hallway; not wearing wrist splint supplied at previous visit  Lymphadenopathy:       Head (right side): No submental, no submandibular, no tonsillar, no preauricular, no posterior auricular and no occipital adenopathy present.       Head (left side): No submental, no submandibular, no tonsillar, no preauricular, no posterior auricular and no occipital adenopathy present.    She has no cervical adenopathy.       Right cervical: No superficial cervical, no deep cervical and no posterior cervical adenopathy present.      Left cervical: No superficial cervical, no deep cervical and no posterior cervical adenopathy present.  Neurological: She is alert and oriented to person, place, and time. She has normal strength. She is not disoriented. She displays no atrophy, no tremor and normal reflexes. No cranial nerve deficit or sensory deficit. She exhibits normal muscle tone. She displays no seizure activity. Coordination and gait normal. GCS eye subscore is 4. GCS verbal subscore is 5. GCS motor subscore is 6.  Reflex Scores:  Brachioradialis reflexes are 1+ on the right side and 1+ on the left side. Skin: Skin is warm, dry and intact. No abrasion, no bruising, no burn, no ecchymosis, no laceration, no lesion, no petechiae and no rash noted. She is not diaphoretic. No cyanosis or erythema. No pallor. Nails show no clubbing.  Forearm skin slightly dry fine scale bilaterally  Psychiatric: She has a normal mood and affect. Her speech is normal and behavior is normal. Judgment and thought content normal. Cognition and memory are normal.  Nursing note and vitals reviewed.         Assessment & Plan:  A-left carpal tunnel syndrome, obesity, elevated blood pressure  P-may continue  Ibuprofen 600mg  po TID prn pain recommended tylenol 1000mg  po QID prn pain as it will not raise blood pressure.  Home stretches demonstrated to patient see exitcare handout for hand symptoms and discussed tight trapezius stretches-e.g. Arm circles, walking up wall, chest stretches, neck AROM, chin tucks, knee to chest and rock side to side on back. Self massage or professional prn, foam roller use or tennis/racquetball.  Heat/cryotherapy 15 minutes QID prn.  Trial thermacare given to patient for use.  Consider physical therapy referral if no improvement with prescribed therapy from [facility] and/or chiropractic care.  Ensure ergonomics correct desk at work avoid repetitive motions if possible/holding phone/laptop in hand use desk/stand and/or break up lifting items into smaller loads/weights.  Patient was instructed to rest, ice, and ROM exercises.  Activity as tolerated.  Wear wrist splint when working on computer and while sleeping. Follow up if symptoms persist or worsen especially if loss of bowel/bladder control, arm/leg weakness and/or saddle paresthesias.  Exitcare handout on carpal tunnel, exercises and wrist splint use printed and given to patient.  Patient verbalized agreement and understanding of treatment plan and had no further questions at this time.  P2:  Injury Prevention and Fitness.  Weight loss, restart exercise walking 5 minutes TID increase 1 minute per episode per week e.g. 6 minutes next week.  Follow up with RN Rolly Salter for blood pressure and weight recheck.  Discussed with patient if unable to lose weight will need to consider blood pressure medication. exitcare handout on hypertension and obesity.  Patient verbalized understanding information/instructions, agreed with plan of care and had no further questions at this time.

## 2018-01-14 ENCOUNTER — Telehealth: Payer: Self-pay | Admitting: *Deleted

## 2018-01-14 MED ORDER — FLUTICASONE PROPIONATE 50 MCG/ACT NA SUSP: 1.0000 | Freq: Two times a day (BID) | NASAL | 6 refills | Status: DC

## 2018-01-15 NOTE — Telephone Encounter (Signed)
Noted agreed with plan of care and signed 

## 2018-03-21 ENCOUNTER — Ambulatory Visit: Payer: Self-pay | Admitting: Registered Nurse

## 2018-03-21 VITALS — BP 140/94 | HR 79

## 2018-03-21 DIAGNOSIS — R12 Heartburn: Secondary | ICD-10-CM

## 2018-03-21 DIAGNOSIS — R0683 Snoring: Secondary | ICD-10-CM

## 2018-03-21 DIAGNOSIS — Z8719 Personal history of other diseases of the digestive system: Secondary | ICD-10-CM

## 2018-03-21 NOTE — Progress Notes (Signed)
Subjective:    Patient ID: Stephanie Chandler, female    DOB: 24-Aug-1966, 52 y.o.   MRN: [id number]  51y/o African-American established female pt presents to clinic requesting evaluation and referral for sleep apnea. States her daughter stayed at her house recently and fell asleep in her room, and the next day told pt that she needs to be checked out because she snores loudly, often and stops breathing often while sleeping. Pt reports she knows she snores and has been told by one other relative who shared hotel room with her that she stops breathing, but didn't pay much mind to it at the time. Does report sleeping a full 7-8 hours but waking up not feeling rested. Will wake with a sore throat. Has woke up during the night feeling like she is choking or needs to cough. Patient requested referral and necessary in order to see sleep apnea specialist and possible sleep study. Per [facility], 2 physicians in network in [place] area. One not accepting new pts currently. One available at [facility], Dr. Huston Foley. Referral may be sent via Epic. Patient would also like to see another GI specialist for her heartburn and IBS with constipation.  Has been intermittently taking probiotics that were prescribed to her and she has to keep in fridge.  Trying to lose weight and exercise more.  Has been making healthier food choices to avoid sweets.  Eating more greens and changed proteins to fish and chicken.  Has noticed more gas with dietary changes.  In the past tried weight watchers but she was unable to keep off weight.  Has noticed 50 lb weight gain in the past couple of years.  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     Review of Systems  Constitutional: Positive for fatigue. Negative for activity change, appetite change, chills, diaphoresis and fever.  HENT: Negative for trouble swallowing and voice change.   Eyes: Negative for photophobia and visual disturbance.  Respiratory:  Negative for cough.   Cardiovascular: Negative for chest pain.  Gastrointestinal: Positive for constipation. Negative for abdominal distention, abdominal pain, blood in stool, diarrhea, nausea and vomiting.  Endocrine: Negative for cold intolerance and heat intolerance.  Genitourinary: Negative for difficulty urinating.  Musculoskeletal: Negative for neck pain and neck stiffness.  Skin: Negative for color change, pallor, rash and wound.  Allergic/Immunologic: Positive for environmental allergies. Negative for food allergies.  Neurological: Negative for tremors, seizures, syncope and weakness.  Hematological: Negative for adenopathy. Does not bruise/bleed easily.  Psychiatric/Behavioral: Negative for agitation, confusion and sleep disturbance.       Objective:   Physical Exam  Constitutional: She is oriented to person, place, and time. She appears well-developed and well-nourished. She is active and cooperative.  Non-toxic appearance. She does not have a sickly appearance. She does not appear ill. No distress.  HENT:  Head: Normocephalic and atraumatic.  Right Ear: Hearing, external ear and ear canal normal. A middle ear effusion is present.  Left Ear: Hearing, external ear and ear canal normal. A middle ear effusion is present.  Nose: Mucosal edema and rhinorrhea present. No nose lacerations, sinus tenderness, nasal deformity, septal deviation or nasal septal hematoma. No epistaxis.  No foreign bodies. Right sinus exhibits no maxillary sinus tenderness and no frontal sinus tenderness. Left sinus exhibits no maxillary sinus tenderness and no frontal sinus tenderness.  Mouth/Throat: Uvula is midline and mucous membranes are normal. Mucous membranes are not pale, not dry and not cyanotic.  She does not have dentures. No oral lesions. No trismus in the jaw. Normal dentition. No dental abscesses, uvula swelling, lacerations or dental caries. Posterior oropharyngeal edema and posterior oropharyngeal  erythema present. No oropharyngeal exudate or tonsillar abscesses.  Cobblestoning poster pharynx; malampati IV airway; bilateral allergic shiners; bilateral TMs air fluid level clear  Eyes: Pupils are equal, round, and reactive to light. Conjunctivae, EOM and lids are normal. Right eye exhibits no chemosis, no discharge, no exudate and no hordeolum. No foreign body present in the right eye. Left eye exhibits no chemosis, no discharge, no exudate and no hordeolum. No foreign body present in the left eye. Right conjunctiva is not injected. Right conjunctiva has no hemorrhage. Left conjunctiva is not injected. Left conjunctiva has no hemorrhage. No scleral icterus. Right eye exhibits normal extraocular motion and no nystagmus. Left eye exhibits normal extraocular motion and no nystagmus. Right pupil is round and reactive. Left pupil is round and reactive. Pupils are equal.  Neck: Trachea normal, normal range of motion and phonation normal. Neck supple. No tracheal tenderness and no muscular tenderness present. No neck rigidity. No tracheal deviation, no edema, no erythema and normal range of motion present. No thyroid mass and no thyromegaly present.  Cardiovascular: Normal rate, regular rhythm, S1 normal, S2 normal, normal heart sounds and intact distal pulses. PMI is not displaced. Exam reveals no gallop, no distant heart sounds and no friction rub.  No murmur heard. Pulmonary/Chest: Effort normal and breath sounds normal. No accessory muscle usage or stridor. No respiratory distress. She has no decreased breath sounds. She has no wheezes. She has no rhonchi. She has no rales. She exhibits no tenderness.  No cough observed in exam room; spoke fulls sentences without difficulty  Abdominal: Soft. Normal appearance. She exhibits no distension, no pulsatile liver, no fluid wave, no abdominal bruit, no ascites, no pulsatile midline mass and no mass. Bowel sounds are decreased. There is no tenderness. There is no  rigidity, no rebound, no guarding, no tenderness at McBurney's point and negative Murphy's sign.  Dull to percussion x 4 quads; hypoactive bowel sounds x 4 quads  Musculoskeletal: Normal range of motion. She exhibits no edema, tenderness or deformity.       Right shoulder: Normal.       Left shoulder: Normal.       Right elbow: Normal.      Left elbow: Normal.       Right hip: Normal.       Left hip: Normal.       Right knee: Normal.       Left knee: Normal.       Cervical back: Normal.       Thoracic back: Normal.       Lumbar back: Normal.       Right hand: Normal.       Left hand: Normal.  Lymphadenopathy:       Head (right side): No submental, no submandibular, no tonsillar, no preauricular, no posterior auricular and no occipital adenopathy present.       Head (left side): No submental, no submandibular, no tonsillar, no preauricular, no posterior auricular and no occipital adenopathy present.    She has no cervical adenopathy.       Right cervical: No superficial cervical, no deep cervical and no posterior cervical adenopathy present.      Left cervical: No superficial cervical, no deep cervical and no posterior cervical adenopathy present.  Neurological: She is alert and oriented to  person, place, and time. She has normal strength. She is not disoriented. She displays no atrophy, no tremor and normal reflexes. No cranial nerve deficit or sensory deficit. She exhibits normal muscle tone. She displays no seizure activity. Coordination and gait normal. GCS eye subscore is 4. GCS verbal subscore is 5. GCS motor subscore is 6.  On/off exam table; in/out of chair without difficulty; gait sure and steady in hallway  Skin: Skin is warm, dry and intact. No abrasion, no bruising, no burn, no ecchymosis, no laceration, no lesion, no petechiae and no rash noted. She is not diaphoretic. No cyanosis or erythema. No pallor. Nails show no clubbing.  Psychiatric: She has a normal mood and affect. Her  speech is normal and behavior is normal. Judgment and thought content normal. Cognition and memory are normal.  Nursing note and vitals reviewed.         Assessment & Plan:  A-loud snoring, GERD, history of IBS  P-neurology and sleep study referral entered for patient to network provider.  Patient to schedule appt. Discussed weight loss as being overweight, allergies and small airway can contribute to snoring. Given exitcare handout on sleep study.  Patient verbalized understanding information/instructions, agreed with plan of care and had no further questions at this time.  Patient given listing of network provider as greater than 10 and she will review and let me know her preference for referral.  Patient main provider is her gynecology provider.  Discussed weight loss, exercise 150 minutes per weeks, sweet alternatives e.g. Fruits, low calorie versus cakes/cookies/ice cream.  Discussed increased gas with increasing fiber content common.  Discussed probiiotics and printed exitcare handout for patient.  Discussed eating rainbow of fruits and vegetables for vitamins and nutritients not just greens.  Discussed exercise and hydration can also help with constipation along with fiber.  Patient verbalized understanding inofrmation/instructions, agreed with plan of care and had no further questions at this time.

## 2018-03-21 NOTE — Patient Instructions (Addendum)
Sleep Study Typically you will stay over night at the sleep study facillty and arrive early evening and  Heartburn Heartburn is a type of pain or discomfort that can happen in the throat or chest. It is often described as a burning pain. It may also cause a bad taste in the mouth. Heartburn may feel worse when you lie down or bend over, and it is often worse at night. Heartburn may be caused by stomach contents that move back up into the esophagus (reflux). Follow these instructions at home: Take these actions to decrease your discomfort and to help avoid complications. Diet  Follow a diet as recommended by your health care provider. This may involve avoiding foods and drinks such as: ? Coffee and tea (with or without caffeine). ? Drinks that contain alcohol. ? Energy drinks and sports drinks. ? Carbonated drinks or sodas. ? Chocolate and cocoa. ? Peppermint and mint flavorings. ? Garlic and onions. ? Horseradish. ? Spicy and acidic foods, including peppers, chili powder, curry powder, vinegar, hot sauces, and barbecue sauce. ? Citrus fruit juices and citrus fruits, such as oranges, lemons, and limes. ? Tomato-based foods, such as red sauce, chili, salsa, and pizza with red sauce. ? Fried and fatty foods, such as donuts, french fries, potato chips, and high-fat dressings. ? High-fat meats, such as hot dogs and fatty cuts of red and white meats, such as rib eye steak, sausage, ham, and bacon. ? High-fat dairy items, such as whole milk, butter, and cream cheese.  Eat small, frequent meals instead of large meals.  Avoid drinking large amounts of liquid with your meals.  Avoid eating meals during the 2-3 hours before bedtime.  Avoid lying down right after you eat.  Do not exercise right after you eat. General instructions  Pay attention to any changes in your symptoms.  Take over-the-counter and prescription medicines only as told by your health care provider. Do not take aspirin,  ibuprofen, or other NSAIDs unless your health care provider told you to do so.  Do not use any tobacco products, including cigarettes, chewing tobacco, and e-cigarettes. If you need help quitting, ask your health care provider.  Wear loose-fitting clothing. Do not wear anything tight around your waist that causes pressure on your abdomen.  Raise (elevate) the head of your bed about 6 inches (15 cm).  Try to reduce your stress, such as with yoga or meditation. If you need help reducing stress, ask your health care provider.  If you are overweight, reduce your weight to an amount that is healthy for you. Ask your health care provider for guidance about a safe weight loss goal.  Keep all follow-up visits as told by your health care provider. This is important. Contact a health care provider if:  You have new symptoms.  You have unexplained weight loss.  You have difficulty swallowing, or it hurts to swallow.  You have wheezing or a persistent cough.  Your symptoms do not improve with treatment.  You have frequent heartburn for more than two weeks. Get help right away if:  You have pain in your arms, neck, jaw, teeth, or back.  You feel sweaty, dizzy, or light-headed.  You have chest pain or shortness of breath.  You vomit and your vomit looks like blood or coffee grounds.  Your stool is bloody or black. This information is not intended to replace advice given to you by your health care provider. Make sure you discuss any questions you have with your  health care provider. Document Released: 04/01/2009 Document Revised: 04/20/2016 Document Reviewed: 03/10/2015 Elsevier Interactive Patient Education  2018 [employer]. You will leave next morning between 5:00 a.m - 7:00 a.m.  The sleep study consists of a recording of your brain waves (EEG). Breathing, heart rate and rhythm (ECG), oxygen level, eye movement, and leg movement.  The technician will glue or or paste several  electrodes to your scalp, face, chest and legs.  You will have belts around your chest and abdomen to record breathing and a finger clasp to check blood oxygen levels.  A tube at your mouth and nose will detect airflow.  There are no needle sticks or painful procedures of any sort.  You will have your own room, and we will make every effort to attend to your comfort and privacy.  Please prepare for your study by the following steps:   Please avoid coffee, tea, soda, chocolate and other caffeine foods or beverages after 12:00 noon on the day of your sleep study.   You must arrive with clean (no oils), conditioners or make up, and please make sure that you wash your hair to ensure that your hair and scalp are clean, dry and free of any hair extensions on the day of your study.  This will help to get a good reading of study.  Please try not to nap on the day of your study.  Please bring a list of all your medications.  Bring any medications that you might need during the time you are within the laboratory, including insulin, sleeping pills, pain medication and anxiety medications.  Bring snacks, water or juice  Please bring clothes to sleep in and your normal overnight bag.  Please leave valuable at home, as we will not be responsible for any lost items.   Probiotics What are probiotics? Probiotics are the good bacteria and yeasts that live in your body and keep you and your digestive system healthy. Probiotics also help your body's defense (immune) system and protect your body against bad bacterial growth. Certain foods contain probiotics, such as yogurt. Probiotics can also be purchased as a supplement. As with any supplement or drug, it is important to discuss its use with your health care provider. What affects the balance of bacteria in my body? The balance of bacteria in your body can be affected by:  Antibiotic medicines. Antibiotics are sometimes necessary to treat infection.  Unfortunately, they may kill good or friendly bacteria in your body as well as the bad bacteria. This may lead to stomach problems like diarrhea, gas, and cramping.  Disease. Some conditions are the result of an overgrowth of bad bacteria, yeasts, parasites, or fungi. These conditions include: ? Infectious diarrhea. ? Stomach and respiratory infections. ? Skin infections. ? Irritable bowel syndrome (IBS). ? Inflammatory bowel diseases. ? Ulcer due to Helicobacter pylori (H. pylori) infection. ? Tooth decay and periodontal disease. ? Vaginal infections.  Stress and poor diet may also lower the good bacteria in your body. What type of probiotic is right for me? Probiotics are available over the counter at your local pharmacy, health food, or grocery store. They come in many different forms, combinations of strains, and dosing strengths. Some may need to be refrigerated. Always read the label for storage and usage instructions. Specific strains have been shown to be more effective for certain conditions. Ask your health care provider what option is best for you. Why would I need probiotics? There are many reasons your health care  provider might recommend a probiotic supplement, including:  Diarrhea.  Constipation.  IBS.  Respiratory infections.  Yeast infections.  Acne, eczema, and other skin conditions.  Frequent urinary tract infections (UTIs).  Are there side effects of probiotics? Some people experience mild side effects when taking probiotics. Side effects are usually temporary and may include:  Gas.  Bloating.  Cramping.  Rarely, serious side effects, such as infection or immune system changes, may occur. What else do I need to know about probiotics?  There are many different strains of probiotics. Certain strains may be more effective depending on your condition. Probiotics are available in varying doses. Ask your health care provider which probiotic you should use  and how often.  If you are taking probiotics along with antibiotics, it is generally recommended to wait at least 2 hours between taking the antibiotic and taking the probiotic. For more information: [facility] [email or web address].[email or web address]/ This information is not intended to replace advice given to you by your health care provider. Make sure you discuss any questions you have with your health care provider. Document Released: 06/10/2014 Document Revised: 10/10/2016 Document Reviewed: 02/10/2014 Elsevier Interactive Patient Education  2017 [employer].

## 2018-03-28 ENCOUNTER — Telehealth: Payer: Self-pay | Admitting: *Deleted

## 2018-03-28 ENCOUNTER — Encounter: Payer: Self-pay | Admitting: *Deleted

## 2018-03-28 MED ORDER — OMEPRAZOLE 20 MG PO CPDR: 20.0000 mg | DELAYED_RELEASE_CAPSULE | Freq: Every day | ORAL | 0 refills | Status: DC

## 2018-03-28 MED ORDER — RANITIDINE HCL 150 MG PO TABS: 150.0000 mg | ORAL_TABLET | Freq: Two times a day (BID) | ORAL | Status: DC

## 2018-03-28 MED ORDER — CALCIUM CARBONATE ANTACID 500 MG PO CHEW: 1.0000 | CHEWABLE_TABLET | Freq: Three times a day (TID) | ORAL | Status: AC | PRN

## 2018-03-28 NOTE — Telephone Encounter (Signed)
Pt c/o tongue feeling swollen, irritated over past week. Believes it may be r/t acid reflux. Was seen 1 week ago in clinic for reflux and given GI referral. Has not heard back regarding this yet. Has not been taking any OTC reflux meds. Advised to avoid spicy and acidic foods. Pt realizes that she has been drinking orange juice daily as part of new diet plan for past few 3-4 days. Plans to stop this. Stephanie Fermo, NP still present from clinic. Discussed sx and from [facility] stock NP dispensed Omeprazole  po daily, increase to BID if necessary #90 RF0 as was planned at last visit but not completed. Pt instructed to f/u with RN if referral offices had not contacted her by 1 week from today. Pt verbalized understanding and agreement. No further questions/concerns.

## 2018-03-28 NOTE — Telephone Encounter (Signed)
Reviewed nursing note.  Discussed with patient avoid trigger foods.  If after one week of omeprazole DR  po daily still having breakthough heartburn consider increasing dose to  po BID or adding zantac  po BID and/or tums 1 po prn heartburn TID prn.  Take omeparzole with food.  GI consult pending for IBS with constipation.  Patient to notify us if she has not been contacted for appt in 1 week.  Last office visit 03/21/2018. Omeprazole dispensed from [facility] to patient from [employer] formulary see paper chart.  Patient verbalized understanding information/instructions, agreed with plan of care and had no further questions at this time.

## 2018-05-21 ENCOUNTER — Institutional Professional Consult (permissible substitution): Payer: No Typology Code available for payment source | Admitting: Neurology

## 2018-05-21 ENCOUNTER — Other Ambulatory Visit: Payer: Self-pay | Admitting: *Deleted

## 2018-05-21 MED ORDER — ALBUTEROL SULFATE HFA 108 (90 BASE) MCG/ACT IN AERS
1.0000 | INHALATION_SPRAY | RESPIRATORY_TRACT | 1 refills | Status: DC | PRN
Start: 2018-05-21 — End: 2018-12-12

## 2018-05-21 NOTE — Telephone Encounter (Signed)
Pt reports as weather has warmed, asthma and allergies worsen and she notices more frequent ShOB. Not currently needing inhaler but knows that the one she has at home is almost empty and would like a refill prior to needing it.

## 2018-06-09 ENCOUNTER — Encounter (HOSPITAL_COMMUNITY): Payer: Self-pay | Admitting: Emergency Medicine

## 2018-06-09 ENCOUNTER — Other Ambulatory Visit: Payer: Self-pay

## 2018-06-09 ENCOUNTER — Emergency Department (HOSPITAL_COMMUNITY): Payer: No Typology Code available for payment source

## 2018-06-09 ENCOUNTER — Emergency Department (HOSPITAL_COMMUNITY)
Admission: EM | Admit: 2018-06-09 | Discharge: 2018-06-09 | Disposition: A | Discharge status: Home / Self Care | Payer: No Typology Code available for payment source | Attending: Emergency Medicine | Admitting: Emergency Medicine

## 2018-06-09 DIAGNOSIS — R51 Headache: Secondary | ICD-10-CM | POA: Insufficient documentation

## 2018-06-09 DIAGNOSIS — R42 Dizziness and giddiness: Secondary | ICD-10-CM | POA: Diagnosis not present

## 2018-06-09 DIAGNOSIS — R2241 Localized swelling, mass and lump, right lower limb: Secondary | ICD-10-CM | POA: Diagnosis not present

## 2018-06-09 DIAGNOSIS — F17210 Nicotine dependence, cigarettes, uncomplicated: Secondary | ICD-10-CM | POA: Insufficient documentation

## 2018-06-09 LAB — PROTIME-INR
INR: 0.94
Prothrombin Time: 12.5 seconds (ref 11.4–15.2)

## 2018-06-09 LAB — APTT: aPTT: 23 seconds — ABNORMAL LOW (ref 24–36)

## 2018-06-09 LAB — CBG MONITORING, ED: Glucose-Capillary: 150 mg/dL — ABNORMAL HIGH (ref 70–99)

## 2018-06-09 LAB — DIFFERENTIAL
Abs Immature Granulocytes: 0 10*3/uL (ref 0.0–0.1)
Basophils Absolute: 0 10*3/uL (ref 0.0–0.1)
Basophils Relative: 0 %
Eosinophils Absolute: 0.3 10*3/uL (ref 0.0–0.7)
Eosinophils Relative: 3 %
Immature Granulocytes: 0 %
Lymphocytes Relative: 37 %
Lymphs Abs: 3 10*3/uL (ref 0.7–4.0)
Monocytes Absolute: 0.7 10*3/uL (ref 0.1–1.0)
Monocytes Relative: 9 %
Neutro Abs: 4.1 10*3/uL (ref 1.7–7.7)
Neutrophils Relative %: 51 %

## 2018-06-09 LAB — CBC
HCT: 42 % (ref 36.0–46.0)
Hemoglobin: 13.6 g/dL (ref 12.0–15.0)
MCH: 29.9 pg (ref 26.0–34.0)
MCHC: 32.4 g/dL (ref 30.0–36.0)
MCV: 92.3 fL (ref 78.0–100.0)
Platelets: 261 10*3/uL (ref 150–400)
RBC: 4.55 MIL/uL (ref 3.87–5.11)
RDW: 12.9 % (ref 11.5–15.5)
WBC: 8.2 10*3/uL (ref 4.0–10.5)

## 2018-06-09 LAB — COMPREHENSIVE METABOLIC PANEL
ALT: 18 U/L (ref 0–44)
AST: 24 U/L (ref 15–41)
Albumin: 3.7 g/dL (ref 3.5–5.0)
Alkaline Phosphatase: 62 U/L (ref 38–126)
Anion gap: 7 (ref 5–15)
BUN: 10 mg/dL (ref 6–20)
CO2: 24 mmol/L (ref 22–32)
Calcium: 9.1 mg/dL (ref 8.9–10.3)
Chloride: 110 mmol/L (ref 98–111)
Creatinine, Ser: 0.84 mg/dL (ref 0.44–1.00)
GFR calc Af Amer: >60 mL/min (ref >60)
GFR calc non Af Amer: >60 mL/min (ref >60)
Glucose, Bld: 150 mg/dL — ABNORMAL HIGH (ref 70–99)
Potassium: 4 mmol/L (ref 3.5–5.1)
Sodium: 141 mmol/L (ref 135–145)
Total Bilirubin: 0.3 mg/dL (ref 0.3–1.2)
Total Protein: 6.4 g/dL — ABNORMAL LOW (ref 6.5–8.1)

## 2018-06-09 LAB — I-STAT TROPONIN, ED: Troponin i, poc: 0 ng/mL (ref 0.00–0.08)

## 2018-06-09 MED ORDER — SODIUM CHLORIDE 0.9 % IV BOLUS
1000.0000 mL | Freq: Once | INTRAVENOUS | Status: AC
  Administered 2018-06-09: 1000 mL via INTRAVENOUS

## 2018-06-09 MED ORDER — ONDANSETRON 4 MG PO TBDP
4.0000 mg | ORAL_TABLET | Freq: Once | ORAL | Status: AC
  Administered 2018-06-09: 4 mg via ORAL
  Filled 2018-06-09: qty 1

## 2018-06-09 MED ORDER — DIAZEPAM 5 MG PO TABS
5.0000 mg | ORAL_TABLET | Freq: Once | ORAL | Status: AC
  Administered 2018-06-09: 5 mg via ORAL
  Filled 2018-06-09: qty 1

## 2018-06-09 MED ORDER — DIAZEPAM 5 MG PO TABS: 5.0000 mg | ORAL_TABLET | Freq: Two times a day (BID) | ORAL | 0 refills | Status: DC | PRN

## 2018-06-09 MED ORDER — MECLIZINE HCL 25 MG PO TABS
25.0000 mg | ORAL_TABLET | Freq: Once | ORAL | Status: AC
  Administered 2018-06-09: 25 mg via ORAL
  Filled 2018-06-09: qty 1

## 2018-06-09 NOTE — ED Triage Notes (Signed)
Patient woke up about an hour ago and felt dizzy, nauseous and c/o HA. Patient also states her feet have been swollen since yesterday. No other complaints at this time. VSS and NAD in triage.

## 2018-06-09 NOTE — ED Notes (Signed)
Pt ambulated without difficulty from RR to room.

## 2018-06-09 NOTE — ED Provider Notes (Signed)
[facility] Provider Note   CSN: [id number] Arrival date & time: 06/09/18  0236     History   Chief Complaint Chief Complaint  Patient presents with  . Dizziness    HPI [name] is a 52 y.o. female.  HPI  This is a 52 year old female who presents with dizziness.  Patient reports that she woke up 1 hour ago with room spinning dizziness, nausea, and headache.  She went to bed and "felt fine."  Her symptoms have since improved.  She denies any speech difficulty, difficulty ambulating.  No history of vertigo in the past.  She does report increase in allergy related symptoms.  Additionally, patient reports lower extremity swelling.  This is worse at the end of the day.  She does occasionally get lower extremity swelling.  She denies chest pain or shortness of breath.  No history of heart failure.  Past Medical History:  Diagnosis Date  . Arthritis     Patient Active Problem List   Diagnosis Date Noted  . Acute pain of right shoulder 03/15/2017  . Strain of right trapezius muscle 03/15/2017  . Musculoskeletal pain 03/15/2017  . OSA (obstructive sleep apnea) 08/23/2015  . Sciatica 11/13/2012    Past Surgical History:  Procedure Laterality Date  . ABDOMINAL HYSTERECTOMY    . HYSTERECTOMY ABDOMINAL WITH SALPINGECTOMY  1997     OB History   None      Home Medications    Prior to Admission medications   Medication Sig Start Date End Date Taking? Authorizing Provider  albuterol (PROVENTIL HFA;VENTOLIN HFA) 108 (90 Base) MCG/ACT inhaler Inhale 1-2 puffs into the lungs every 4 (four) hours as needed for wheezing or shortness of breath. Patient not taking: Reported on 06/09/2018 05/21/18   Barbaraann Barthel, NP  cyclobenzaprine (FLEXERIL) 10 MG tablet Take 0.5-1 tablets (5-10 mg total) by mouth 3 (three) times daily as needed for muscle spasms. Patient not taking: Reported on 06/09/2018 06/26/17   Albina Billet A, NP  diazepam  (VALIUM) 5 MG tablet Take 1 tablet (5 mg total) by mouth every 12 (twelve) hours as needed (dizziness). 06/09/18   Horton, Mayer Masker, MD  fluticasone (FLONASE) 50 MCG/ACT nasal spray Place 1 spray into both nostrils 2 (two) times daily. Patient not taking: Reported on 06/09/2018 01/14/18 06/09/25  Barbaraann Barthel, NP  omeprazole (PRILOSEC) 20 MG capsule Take 1 capsule (20 mg total) by mouth daily. Patient not taking: Reported on 06/09/2018 03/28/18 06/26/18  Barbaraann Barthel, NP  ranitidine (ZANTAC) 150 MG tablet Take 1 tablet (150 mg total) by mouth 2 (two) times daily for 14 days. Patient not taking: Reported on 06/09/2018 03/28/18 06/09/25  Albina Billet A, NP  sodium chloride (OCEAN) 0.65 % SOLN nasal spray Place 2 sprays into both nostrils every 2 (two) hours while awake. Patient not taking: Reported on 06/09/2018 11/01/17 06/09/26  Barbaraann Barthel, NP    Family History Family History  Problem Relation Age of Onset  . Diabetes Mother   . Hypertension Mother   . Arthritis Mother   . Diabetes Father   . Kidney disease Father   . Heart disease Maternal Grandmother   . Diabetes Paternal Grandmother     Social History Social History   Tobacco Use  . Smoking status: Light Tobacco Smoker    Packs/day: 0.12    Years: 0.50    Pack years: 0.06    Types: Cigarettes  . Smokeless tobacco: Never Used  Substance Use Topics  . Alcohol use: Yes    Alcohol/week: 0.6 oz    Types: 1 Glasses of wine per week    Comment: 1 glass a year  . Drug use: No     Allergies   Flagyl [metronidazole]   Review of Systems Review of Systems  Constitutional: Negative for fever.  HENT: Positive for postnasal drip. Negative for ear pain.   Respiratory: Negative for shortness of breath.   Cardiovascular: Negative for chest pain.  Gastrointestinal: Positive for nausea. Negative for abdominal pain and vomiting.  Musculoskeletal: Negative for gait problem.  Neurological: Positive for dizziness and  headaches. Negative for weakness and numbness.  All other systems reviewed and are negative.    Physical Exam Updated Vital Signs BP (!) 151/73 (BP Location: Left Arm)   Pulse 73   Temp 97.8 F (36.6 C)   Resp 16   Ht 5' (1.524 m)   Wt 91.6 kg (202 lb)   SpO2 100%   BMI 39.45 kg/m   Physical Exam  Constitutional: She is oriented to person, place, and time. She appears well-developed and well-nourished. No distress.  Overweight, no acute distress  HENT:  Head: Normocephalic and atraumatic.  Eyes: Pupils are equal, round, and reactive to light.  Rightward horizontal nystagmus noted  Neck: Normal range of motion. Neck supple.  Cardiovascular: Normal rate, regular rhythm and normal heart sounds.  Pulmonary/Chest: Effort normal. No respiratory distress. She has no wheezes.  Abdominal: Soft. Bowel sounds are normal.  Musculoskeletal:  Raise bilateral lower extremity edema, symmetric  Neurological: She is alert and oriented to person, place, and time.  Cranial nerves II through XII intact, 5 out of 5 strength in all 4 extremities, no dysmetria to finger-nose-finger  Skin: Skin is warm and dry.  Psychiatric: She has a normal mood and affect.  Nursing note and vitals reviewed.    ED Treatments / Results  Labs (all labs ordered are listed, but only abnormal results are displayed) Labs Reviewed  APTT - Abnormal; Notable for the following components:      Result Value   aPTT 23 (*)    All other components within normal limits  COMPREHENSIVE METABOLIC PANEL - Abnormal; Notable for the following components:   Glucose, Bld 150 (*)    Total Protein 6.4 (*)    All other components within normal limits  CBG MONITORING, ED - Abnormal; Notable for the following components:   Glucose-Capillary 150 (*)    All other components within normal limits  PROTIME-INR  CBC  DIFFERENTIAL  I-STAT TROPONIN, ED    EKG EKG Interpretation  Date/Time:  Sunday June 09 2018 02:43:02  EDT Ventricular Rate:  86 PR Interval:  174 QRS Duration: 78 QT Interval:  382 QTC Calculation: 457 R Axis:   -57 Text Interpretation:  Normal sinus rhythm Possible Left atrial enlargement Left anterior fascicular block Possible Anterior infarct , age undetermined Abnormal ECG No old tracing to compare Confirmed by Dione Booze ([id number]) on 06/09/2018 2:55:45 AM   Radiology Ct Head Wo Contrast  Result Date: 06/09/2018 CLINICAL DATA:  Headache and dizziness. EXAM: CT HEAD WITHOUT CONTRAST TECHNIQUE: Contiguous axial images were obtained from the base of the skull through the vertex without intravenous contrast. COMPARISON:  None. FINDINGS: Brain: There is no mass, hemorrhage or extra-axial collection. The size and configuration of the ventricles and extra-axial CSF spaces are normal. There is no acute or chronic infarction. The brain parenchyma is normal. Vascular: No abnormal hyperdensity of the  major intracranial arteries or dural venous sinuses. No intracranial atherosclerosis. Skull: The visualized skull base, calvarium and extracranial soft tissues are normal. Sinuses/Orbits: No fluid levels or advanced mucosal thickening of the visualized paranasal sinuses. No mastoid or middle ear effusion. The orbits are normal. IMPRESSION: Normal brain. Electronically Signed   By: Deatra RobinsonKevin  Herman M.D.   On: 06/09/2018 04:39    Procedures Procedures (including critical care time)  Medications Ordered in ED Medications  ondansetron (ZOFRAN-ODT) disintegrating tablet 4 mg (4 mg Oral Given 06/09/18 0449)  meclizine (ANTIVERT) tablet 25 mg (25 mg Oral Given 06/09/18 0452)  diazepam (VALIUM) tablet 5 mg (5 mg Oral Given 06/09/18 0621)  sodium chloride 0.9 % bolus 1,000 mL (1,000 mLs Intravenous New Bag/Given 06/09/18 [id number])     Initial Impression / Assessment and Plan / ED Course  I have reviewed the triage vital signs and the nursing notes.  Pertinent labs & imaging results that were available during my care  of the patient were reviewed by me and considered in my medical decision making (see chart for details).  Clinical Course as of Jun 09 721  Wynelle LinkSun Jun 09, 2018  [id number] She continues to feel somewhat dizzy when laying flat specifically.  Her work-up is largely reassuring.  Patient redosed Valium and fluids.   [CH]    Clinical Course User Index [CH] Horton, Mayer Maskerourtney F, MD    Patient presents with dizziness.  Describes room spinning dizziness.  Is nontoxic on exam.  No evidence of cerebellar dysfunction or ataxia.  She is otherwise nontoxic-appearing.  Lab work-up reviewed from triage and reassuring.  EKG shows no evidence of arrhythmia.  No significant metabolic derangements or abnormalities.  CT scan is reassuring.  Suspect benign positional vertigo.  Patient was initially given meclizine and Zofran.  She subsequently was given Valium and fluids given ongoing symptoms.  On recheck, she states she feels much better and is no longer dizzy.  She was able to ambulate without difficulty.  Will discharge home with a short course of Valium as needed.  Patient was instructed to follow-up closely with her primary physician peer  After history, exam, and medical workup I feel the patient has been appropriately medically screened and is safe for discharge home. Pertinent diagnoses were discussed with the patient. Patient was given return precautions.   Final Clinical Impressions(s) / ED Diagnoses   Final diagnoses:  Vertigo    ED Discharge Orders        Ordered    diazepam (VALIUM) 5 MG tablet  Every 12 hours PRN     06/09/18 0720       Shon BatonHorton, Courtney F, MD 06/09/18 [id number]

## 2018-06-09 NOTE — Discharge Instructions (Addendum)
You were seen today for dizziness.  You likely have vertigo.  Take Valium as needed for dizziness.  Make sure to stay hydrated.  Do not drive or operate heavy machinery when taking Valium.

## 2018-06-18 ENCOUNTER — Encounter: Payer: Self-pay | Admitting: Registered Nurse

## 2018-06-18 ENCOUNTER — Ambulatory Visit: Payer: Self-pay | Admitting: Registered Nurse

## 2018-06-18 VITALS — BP 130/85 | HR 83 | Temp 99.1°F

## 2018-06-18 DIAGNOSIS — H65192 Other acute nonsuppurative otitis media, left ear: Secondary | ICD-10-CM

## 2018-06-18 DIAGNOSIS — J00 Acute nasopharyngitis [common cold]: Secondary | ICD-10-CM

## 2018-06-18 DIAGNOSIS — H8112 Benign paroxysmal vertigo, left ear: Secondary | ICD-10-CM

## 2018-06-18 MED ORDER — AMOXICILLIN-POT CLAVULANATE 875-125 MG PO TABS: 1.0000 | ORAL_TABLET | Freq: Two times a day (BID) | ORAL | 0 refills | Status: AC

## 2018-06-18 MED ORDER — PHENYLEPHRINE HCL 5 MG PO TABS: 5.0000 mg | ORAL_TABLET | Freq: Four times a day (QID) | ORAL | 0 refills | Status: AC | PRN

## 2018-06-18 MED ORDER — MECLIZINE HCL 25 MG PO TABS: 25.0000 mg | ORAL_TABLET | Freq: Four times a day (QID) | ORAL | 0 refills | Status: AC | PRN

## 2018-06-18 MED ORDER — SALINE SPRAY 0.65 % NA SOLN: 2.0000 | NASAL | 0 refills | Status: DC

## 2018-06-18 MED ORDER — FLUTICASONE PROPIONATE 50 MCG/ACT NA SUSP: 1.0000 | Freq: Two times a day (BID) | NASAL | 6 refills | Status: DC

## 2018-06-18 NOTE — Patient Instructions (Signed)
Otitis Media, Adult Otitis media occurs when there is inflammation and fluid in the middle ear. Your middle ear is a part of the ear that contains bones for hearing as well as air that helps send sounds to your brain. What are the causes? This condition is caused by a blockage in the eustachian tube. This tube drains fluid from the ear to the back of the nose (nasopharynx). A blockage in this tube can be caused by an object or by swelling (edema) in the tube. Problems that can cause a blockage include:  A cold or other upper respiratory infection.  Allergies.  An irritant, such as tobacco smoke.  Enlarged adenoids. The adenoids are areas of soft tissue located high in the back of the throat, behind the nose and the roof of the mouth.  A mass in the nasopharynx.  Damage to the ear caused by pressure changes (barotrauma). What are the signs or symptoms? Symptoms of this condition include:  Ear pain.  A fever.  Decreased hearing.  A headache.  Tiredness (lethargy).  Fluid leaking from the ear.  Ringing in the ear. How is this diagnosed? This condition is diagnosed with a physical exam. During the exam your health care provider will use an instrument called an otoscope to look into your ear and check for redness, swelling, and fluid. He or she will also ask about your symptoms. Your health care provider may also order tests, such as:  A test to check the movement of the eardrum (pneumatic otoscopy). This test is done by squeezing a small amount of air into the ear.  A test that changes air pressure in the middle ear to check how well the eardrum moves and whether the eustachian tube is working (tympanogram). How is this treated? This condition usually goes away on its own within 3-5 days. But if the condition is caused by a bacteria infection and does not go away own its own, or keeps coming back, your health care provider may:  Prescribe antibiotic medicines to treat the  infection.  Prescribe or recommend medicines to control pain. Follow these instructions at home:  Take over-the-counter and prescription medicines only as told by your health care provider.  If you were prescribed an antibiotic medicine, take it as told by your health care provider. Do not stop taking the antibiotic even if you start to feel better.  Keep all follow-up visits as told by your health care provider. This is important. Contact a health care provider if:  You have bleeding from your nose.  There is a lump on your neck.  You are not getting better in 5 days.  You feel worse instead of better. Get help right away if:  You have severe pain that is not controlled with medicine.  You have swelling, redness, or pain around your ear.  You have stiffness in your neck.  A part of your face is paralyzed.  The bone behind your ear (mastoid) is tender when you touch it.  You develop a severe headache. Summary  Otitis media is redness, soreness, and swelling of the middle ear.  This condition usually goes away on its own within 3-5 days.  If the problem does not go away in 3-5 days, your health care provider may prescribe or recommend medicines to treat your symptoms.  If you were prescribed an antibiotic medicine, take it as told by your health care provider. This information is not intended to replace advice given to you by your   to you by your health care provider. Make sure you discuss any questions you have with your health care provider. Document Released: 08/18/2004 Document Revised: 11/03/2016 Document Reviewed: 11/03/2016 Elsevier Interactive Patient Education  2018 [employer] Nasal Allergies Nasal allergies are a reaction to allergens in the air. Allergens are particles in the air that cause your body to have an allergic reaction. Nasal allergies are not passed from person to person (are not contagious). They cannot be cured, but they can be controlled. What are the  causes? Seasonal nasal allergies (hay fever) are caused by pollen allergens that come from grasses, trees, and weeds. Year-round nasal allergies (perennial allergic rhinitis) are caused by allergens such as house dust mites, pet dander, and mold spores. What increases the risk? The following factors may make you more likely to develop this condition:  Having certain health conditions. These include: ? Other types of allergies, such as food allergies. ? Asthma. ? Eczema.  Having a close relative who has allergies or asthma.  Exposure to house dust, pollen, dander, or other allergens at home or at work.  Exposure to air pollution or secondhand smoke when you were a child.  What are the signs or symptoms? Symptoms of this condition include:  Sneezing.  Runny nose or stuffy nose (congestion).  Watery (tearing) eyes.  Itchy eyes, nose, mouth, throat, skin, or other area.  Sore throat.  Headache.  Decreased sense of smell or taste.  Fatigue. This may occur if you have trouble sleeping due to allergies.  Swollen eyelids.  How is this diagnosed? This condition is diagnosed with a medical history and physical exam. Allergy testing may be done to determine exactly what triggers your nasal allergies. How is this treated? There is no cure for nasal allergies. Treatment focuses on controlling your symptoms, and it may include:  Medicines that block allergy symptoms. These may include allergy shots, nasal sprays, and oral antihistamines.  Avoiding the allergen.  Follow these instructions at home:  Avoid the allergen that is causing your symptoms, if possible.  Keep windows closed. If possible, use air conditioning when pollen counts are high.  Do not use fans in your home.  Do not hang clothes outside to dry.  Wear sunglasses to keep pollen out of your eyes.  Wash your hands right away after you touch household pets.  Take over-the-counter and prescription medicines only  as told by your health care provider.  Keep all follow-up visits as told by your health care provider. This is important. Contact a health care provider if:  You have a fever.  You develop a cough that does not go away (is persistent).  You start to wheeze.  Your symptoms do not improve with treatment.  You have thick nasal discharge.  You start to have nosebleeds. Get help right away if:  Your tongue or your lips are swollen.  You have trouble breathing.  You feel light-headed or you feel like you are going to faint.  You have cold sweats. This information is not intended to replace advice given to you by your health care provider. Make sure you discuss any questions you have with your health care provider. Document Released: 11/13/2005 Document Revised: 04/17/2016 Document Reviewed: 05/26/2015 Elsevier Interactive Patient Education  2018 [employer] Nonallergic Rhinitis  1. Nonallergic rhinitis is a term used by allergist to describe inflammation in the nose that is not due to an allergic source (i.e., pollens, mold, animal dander or dust mites). 2. Nonallergic rhinitis can  mimic many of the symptoms caused by allergies.  This includes a runny nose ("rhinorrhea"), sneezing, congestion, ear fullness and post nasal drip. 3. These symptoms usually occur year round but can be made worse by many environmental factors including weather change, irritants such as cigarette smoke, fumes, perfumes, detergents and many others. These are not allergens, but are irritants, and do not cause the formation of antibodies like true allergens.  For this reason most people with nonallergic rhinitis have negative skin tests. 4. Unfortunately, we have a poor understanding of what causes nonallergic rhinitis but certain factors such as deviated septum, sinusitis and nasal polyps may contribute to the symptoms.  Due to the poor understanding of what causes nonallergic rhinitis it cannot be cured and our  treatment is only symptomatic to control symptoms. 5. Important factors in the successful treatment of nonallergic rhinitis include avoidance of the irritants that cause symptoms; for example, people who continue to smoke may never improve.  Continuous therapy works better than intermittent.  This may mean taking medications once daily or 3-4 times a day. Helpful treatments include nasal saline lavage, nasal ipratropium (Atrovent), nasal steroids, and decongestants.  Pure antihistamines don't seem to work very well.  Immunotherapy (allergy shots) has no role in the treatment of nonallergic rhinitis.  Several medications may have to be tried before a good combination is found for you.  These medications, in general, are safe for long term use.  Tolerance may develop to the therapeutic effects of these medications: Frequently changing between several helpful medications can prevent this should it occur.Vertigo Vertigo is the feeling that you or your surroundings are moving when they are not. Vertigo can be dangerous if it occurs while you are doing something that could endanger you or others, such as driving. What are the causes? This condition is caused by a disturbance in the signals that are sent by your body's sensory systems to your brain. Different causes of a disturbance can lead to vertigo, including:  Infections, especially in the inner ear.  A bad reaction to a drug, or misuse of alcohol and medicines.  Withdrawal from drugs or alcohol.  Quickly changing positions, as when lying down or rolling over in bed.  Migraine headaches.  Decreased blood flow to the brain.  Decreased blood pressure.  Increased pressure in the brain from a head or neck injury, stroke, infection, tumor, or bleeding.  Central nervous system disorders.  What are the signs or symptoms? Symptoms of this condition usually occur when you move your head or your eyes in different directions. Symptoms may start  suddenly, and they usually last for less than a minute. Symptoms may include:  Loss of balance and falling.  Feeling like you are spinning or moving.  Feeling like your surroundings are spinning or moving.  Nausea and vomiting.  Blurred vision or double vision.  Difficulty hearing.  Slurred speech.  Dizziness.  Involuntary eye movement (nystagmus).  Symptoms can be mild and cause only slight annoyance, or they can be severe and interfere with daily life. Episodes of vertigo may return (recur) over time, and they are often triggered by certain movements. Symptoms may improve over time. How is this diagnosed? This condition may be diagnosed based on medical history and the quality of your nystagmus. Your health care provider may test your eye movements by asking you to quickly change positions to trigger the nystagmus. This may be called the Dix-Hallpike test, head thrust test, or roll test. You may be referred  to a health care provider who specializes in ear, nose, and throat (ENT) problems (otolaryngologist) or a provider who specializes in disorders of the central nervous system (neurologist). You may have additional testing, including:  A physical exam.  Blood tests.  MRI.  A CT scan.  An electrocardiogram (ECG). This records electrical activity in your heart.  An electroencephalogram (EEG). This records electrical activity in your brain.  Hearing tests.  How is this treated? Treatment for this condition depends on the cause and the severity of the symptoms. Treatment options include:  Medicines to treat nausea or vertigo. These are usually used for severe cases. Some medicines that are used to treat other conditions may also reduce or eliminate vertigo symptoms. These include: ? Medicines that control allergies (antihistamines). ? Medicines that control seizures (anticonvulsants). ? Medicines that relieve depression (antidepressants). ? Medicines that relieve anxiety  (sedatives).  Head movements to adjust your inner ear back to normal. If your vertigo is caused by an ear problem, your health care provider may recommend certain movements to correct the problem.  Surgery. This is rare.  Follow these instructions at home: Safety  Move slowly.Avoid sudden body or head movements.  Avoid driving.  Avoid operating heavy machinery.  Avoid doing any tasks that would cause danger to you or others if you would have a vertigo episode during the task.  If you have trouble walking or keeping your balance, try using a cane for stability. If you feel dizzy or unstable, sit down right away.  Return to your normal activities as told by your health care provider. Ask your health care provider what activities are safe for you. General instructions  Take over-the-counter and prescription medicines only as told by your health care provider.  Avoid certain positions or movements as told by your health care provider.  Drink enough fluid to keep your urine clear or pale yellow.  Keep all follow-up visits as told by your health care provider. This is important. Contact a health care provider if:  Your medicines do not relieve your vertigo or they make it worse.  You have a fever.  Your condition gets worse or you develop new symptoms.  Your family or friends notice any behavioral changes.  Your nausea or vomiting gets worse.  You have numbness or a "pins and needles" sensation in part of your body. Get help right away if:  You have difficulty moving or speaking.  You are always dizzy.  You faint.  You develop severe headaches.  You have weakness in your hands, arms, or legs.  You have changes in your hearing or vision.  You develop a stiff neck.  You develop sensitivity to light. This information is not intended to replace advice given to you by your health care provider. Make sure you discuss any questions you have with your health care  provider. Document Released: 08/23/2005 Document Revised: 04/26/2016 Document Reviewed: 03/08/2015 Elsevier Interactive Patient Education  2018 [employer] Sinus Rinse What is a sinus rinse? A sinus rinse is a simple home treatment that is used to rinse your sinuses with a sterile mixture of salt and water (saline solution). Sinuses are air-filled spaces in your skull behind the bones of your face and forehead that open into your nasal cavity. You will use the following:  Saline solution.  Neti pot or spray bottle. This releases the saline solution into your nose and through your sinuses. Neti pots and spray bottles can be purchased at [employer], a  health food store, or online.  When would I do a sinus rinse? A sinus rinse can help to clear mucus, dirt, dust, or pollen from the nasal cavity. You may do a sinus rinse when you have a cold, a virus, nasal allergy symptoms, a sinus infection, or stuffiness in the nose or sinuses. If you are considering a sinus rinse:  Ask your child's health care provider before performing a sinus rinse on your child.  Do not do a sinus rinse if you have had ear or nasal surgery, ear infection, or blocked ears.  How do I do a sinus rinse?  Wash your hands.  Disinfect your device according to the directions provided and then dry it.  Use the solution that comes with your device or one that is sold separately in stores. Follow the mixing directions on the package.  Fill your device with the amount of saline solution as directed by the device instructions.  Stand over a sink and tilt your head sideways over the sink.  Place the spout of the device in your upper nostril (the one closer to the ceiling).  Gently pour or squeeze the saline solution into the nasal cavity. The liquid should drain to the lower nostril if you are not overly congested.  Gently blow your nose. Blowing too hard may cause ear pain.  Repeat in the other  nostril.  Clean and rinse your device with clean water and then air-dry it. Are there risks of a sinus rinse? Sinus rinse is generally very safe and effective. However, there are a few risks, which include:  A burning sensation in the sinuses. This may happen if you do not make the saline solution as directed. Make sure to follow all directions when making the saline solution.  Infection from contaminated water. This is rare, but possible.  Nasal irritation.  This information is not intended to replace advice given to you by your health care provider. Make sure you discuss any questions you have with your health care provider. Document Released: 06/10/2014 Document Revised: 10/10/2016 Document Reviewed: 03/31/2014 Elsevier Interactive Patient Education  2017 [employer].

## 2018-06-18 NOTE — Progress Notes (Signed)
Subjective:    Patient ID: Stephanie Chandler, female    DOB: 09/25/1966, 52 y.o.   MRN: [id number]  52y/o African-American established female pt c/o sinus pain and pressure, post nasal drip, "bad taste in mouth", losing her voice, left ear pain and recurrent vertigo. Taking valium prn po; meclizine was not helpful in the past.  Has been using nasal saline needs refill and ran out of flonase.  + sick contacts at work     Review of Systems  Constitutional: Negative for activity change, appetite change, chills, diaphoresis, fatigue, fever and unexpected weight change.  HENT: Positive for congestion, ear pain, postnasal drip, rhinorrhea, sinus pressure, sinus pain, sore throat and voice change. Negative for dental problem, drooling, ear discharge, facial swelling, hearing loss, mouth sores, nosebleeds, sneezing, tinnitus and trouble swallowing.   Eyes: Negative for photophobia, pain, discharge, redness, itching and visual disturbance.  Respiratory: Positive for cough. Negative for choking, chest tightness, shortness of breath, wheezing and stridor.   Cardiovascular: Negative for chest pain, palpitations and leg swelling.  Gastrointestinal: Negative for abdominal distention, abdominal pain, blood in stool, constipation, diarrhea, nausea and vomiting.  Endocrine: Negative for cold intolerance and heat intolerance.  Genitourinary: Negative for difficulty urinating, dysuria and hematuria.  Musculoskeletal: Negative for arthralgias, back pain, gait problem, joint swelling, myalgias, neck pain and neck stiffness.  Skin: Negative for color change, pallor, rash and wound.  Allergic/Immunologic: Positive for environmental allergies. Negative for food allergies.  Neurological: Positive for dizziness and headaches. Negative for tremors, seizures, syncope, facial asymmetry, speech difficulty, weakness, light-headedness and numbness.  Hematological: Negative for adenopathy. Does not bruise/bleed easily.   Psychiatric/Behavioral: Negative for agitation, confusion and sleep disturbance.       Objective:   Physical Exam  Constitutional: She is oriented to person, place, and time. Vital signs are normal. She appears well-developed and well-nourished. She is active and cooperative.  Non-toxic appearance. She does not have a sickly appearance. She appears ill. No distress.  HENT:  Head: Normocephalic and atraumatic.  Right Ear: Hearing, external ear and ear canal normal. A middle ear effusion is present.  Left Ear: Hearing, external ear and ear canal normal. Tympanic membrane is erythematous and bulging. A middle ear effusion is present.  Nose: Mucosal edema and rhinorrhea present. No nose lacerations, sinus tenderness, nasal deformity, septal deviation or nasal septal hematoma. No epistaxis.  No foreign bodies. Right sinus exhibits maxillary sinus tenderness and frontal sinus tenderness. Left sinus exhibits maxillary sinus tenderness and frontal sinus tenderness.  Mouth/Throat: Uvula is midline and mucous membranes are normal. Mucous membranes are not pale, not dry and not cyanotic. She does not have dentures. No oral lesions. No trismus in the jaw. Normal dentition. No dental abscesses, uvula swelling, lacerations or dental caries. Posterior oropharyngeal edema and posterior oropharyngeal erythema present. No oropharyngeal exudate or tonsillar abscesses. No tonsillar exudate.  Cobblestoning posterior pharynx; bilateral allergic shiners; left TM erythema around perimeter bulging and air fluid level slight opacity; nasal turbinates edema erythema clear discharge; voice normal; mild tenderness bilateral sinuses frontal and maxillary  Eyes: Pupils are equal, round, and reactive to light. Conjunctivae, EOM and lids are normal. Right eye exhibits no chemosis, no discharge, no exudate and no hordeolum. No foreign body present in the right eye. Left eye exhibits no chemosis, no discharge, no exudate and no  hordeolum. No foreign body present in the left eye. Right conjunctiva is not injected. Right conjunctiva has no hemorrhage. Left conjunctiva is not injected. Left conjunctiva  has no hemorrhage. No scleral icterus. Right eye exhibits normal extraocular motion and no nystagmus. Left eye exhibits normal extraocular motion and no nystagmus. Right pupil is round and reactive. Left pupil is round and reactive. Pupils are equal.  Neck: Trachea normal, normal range of motion and phonation normal. Neck supple. No tracheal tenderness, no spinous process tenderness and no muscular tenderness present. No neck rigidity. No tracheal deviation, no edema, no erythema and normal range of motion present. No thyroid mass and no thyromegaly present.  Cardiovascular: Normal rate, regular rhythm, S1 normal, S2 normal, normal heart sounds and intact distal pulses. PMI is not displaced. Exam reveals no gallop and no friction rub.  No murmur heard. Pulmonary/Chest: Effort normal and breath sounds normal. No accessory muscle usage or stridor. No respiratory distress. She has no decreased breath sounds. She has no wheezes. She has no rhonchi. She has no rales. She exhibits no tenderness.  No cough observed in exam room; spoke full sentences without difficulty  Abdominal: Soft. Normal appearance. She exhibits no distension and no fluid wave. There is no rigidity and no guarding.  Musculoskeletal: Normal range of motion. She exhibits no edema or tenderness.       Right shoulder: Normal.       Left shoulder: Normal.       Right elbow: Normal.      Left elbow: Normal.       Right hip: Normal.       Left hip: Normal.       Right knee: Normal.       Left knee: Normal.       Cervical back: Normal.       Thoracic back: Normal.       Lumbar back: Normal.       Right hand: Normal.       Left hand: Normal.  Lymphadenopathy:       Head (right side): No submental, no submandibular, no tonsillar, no preauricular, no posterior  auricular and no occipital adenopathy present.       Head (left side): No submental, no submandibular, no tonsillar, no preauricular, no posterior auricular and no occipital adenopathy present.    She has no cervical adenopathy.       Right cervical: No superficial cervical, no deep cervical and no posterior cervical adenopathy present.      Left cervical: No superficial cervical, no deep cervical and no posterior cervical adenopathy present.  Neurological: She is alert and oriented to person, place, and time. She has normal strength. She is not disoriented. She displays no atrophy and no tremor. No cranial nerve deficit or sensory deficit. She exhibits normal muscle tone. She displays no seizure activity. Coordination and gait normal. GCS eye subscore is 4. GCS verbal subscore is 5. GCS motor subscore is 6.  On/off exam table; in/out of chair without difficulty; gait sure and steady in hallway  Skin: Skin is warm, dry and intact. Capillary refill takes less than 2 seconds. No abrasion, no bruising, no burn, no ecchymosis, no laceration, no lesion, no petechiae and no rash noted. She is not diaphoretic. No cyanosis or erythema. No pallor. Nails show no clubbing.  Psychiatric: She has a normal mood and affect. Her speech is normal and behavior is normal. Judgment and thought content normal. She is not actively hallucinating. Cognition and memory are normal. She is attentive.  Nursing note and vitals reviewed.         Assessment & Plan:  A-left acute otitis media;  acute rhinosinusitis and vertigo  P-Supportive treatment. augmentin 875mg  po BID x 10 days #20 RF0 dispensed from [facility] to patient  No evidence of invasive bacterial infection, non toxic and well hydrated.  This is most likely self limiting viral infection.  I do not see where any further testing or imaging is necessary at this time.   I will suggest supportive care, rest, good hygiene and encourage the patient to take adequate fluids.  The  patient is to return to clinic or EMERGENCY ROOM if symptoms worsen or change significantly e.g. ear pain, fever, purulent discharge from ears or bleeding.  Exitcare handout on otitis media printed and given to patient.  Patient verbalized agreement and understanding of treatment plan.    Restart flonase 1 spray each nostril BID #1 RF6 electronic rx to her pharmacy of choice, given 1 bottle from clinic stock saline 2 sprays each nostril q2h wa prn congestion.  May continue mucinex 600mg  po BID OTC and phenylephrine 5-10mg  po QID prn rhinitis 8 UD given from clinic stock.  If no improvement with 48 hours of saline and flonase use start augmentin 875mg  po BID x 10 days #20 RF0 dispensed from [facility].  Denied personal or family history of ENT cancer.  Shower BID especially prior to bed. No evidence of systemic bacterial infection, non toxic and well hydrated.  I do not see where any further testing or imaging is necessary at this time.   I will suggest supportive care, rest, good hygiene and encourage the patient to take adequate fluids.  The patient is to return to clinic or EMERGENCY ROOM if symptoms worsen or change significantly.  Exitcare handout on sinusitis and sinus rinse printed and  given to patient.  Patient verbalized agreement and understanding of treatment plan and had no further questions at this time.    Discussed with patient otitis media left probably causing vertigo but could also be age. retrial Supportive treatment may take up to 4 doses meclizine 25mg  po QID prn vertigo per day max 100mg  per 24 hours. Discussed signs/symptoms stroke. Do not drive during vertigo episodes. Follow up if aphasia, dysphasia, visual changes, weakness, fall, worst headache of life, incoordination, fever, ear discharge. Consider ENT evaluation/follow up with PCM if worsening symptoms not controlled with meclizine or needs Rx valium refill.  exitcare vertigo handout printed and given to patient.  Patient verbalized  understanding of information/agreed with plan of care and had no further questions at this time.  P2:  Hand washing and cover cough

## 2018-06-19 ENCOUNTER — Ambulatory Visit: Payer: PRIVATE HEALTH INSURANCE | Admitting: Neurology

## 2018-06-19 ENCOUNTER — Encounter: Payer: Self-pay | Admitting: Neurology

## 2018-06-19 VITALS — BP 130/88 | HR 92 | Ht 61.0 in | Wt 202.0 lb

## 2018-06-19 DIAGNOSIS — R351 Nocturia: Secondary | ICD-10-CM

## 2018-06-19 DIAGNOSIS — R400 Somnolence: Secondary | ICD-10-CM

## 2018-06-19 DIAGNOSIS — R0683 Snoring: Secondary | ICD-10-CM

## 2018-06-19 DIAGNOSIS — E669 Obesity, unspecified: Secondary | ICD-10-CM | POA: Diagnosis not present

## 2018-06-19 DIAGNOSIS — R0681 Apnea, not elsewhere classified: Secondary | ICD-10-CM

## 2018-06-19 DIAGNOSIS — J4520 Mild intermittent asthma, uncomplicated: Secondary | ICD-10-CM

## 2018-06-19 NOTE — Patient Instructions (Signed)

## 2018-06-19 NOTE — Progress Notes (Addendum)
Subjective:    Patient ID: Stephanie Chandler is a 52 y.o. female.  HPI     Stephanie Foley, MD, PhD [facility] [address], [address] [address] [place], [place] [address]  Dear Stephanie Chandler,   I saw your patient, Stephanie Chandler, upon your kind request in my neurologic clinic today for initial consultation of her sleep disorder, in particular, concern for underlying obstructive sleep apnea. The patient is unaccompanied today. As you know, Stephanie Chandler is a 31 year old right-handed woman with an underlying medical history of asthma, arthritis, reflux disease, IBS and obesity, who reports snoring and excessive daytime somnolence as well as witnessed apneic breathing pauses while asleep per daughter's report. I reviewed your office note from 03/21/2018. Her Epworth sleepiness score is 19 out of 24, fatigue score is 39 out of 63. She is single, she lives with her daughter. She has 2 children. She smokes cigarettes about 6 per day on average, drinks alcohol very rarely, caffeine in the form of soda or tea, but not daily. She is not aware of any family history of OSA. She denies telltale symptoms of restless leg syndrome and morning headaches but has nocturia about once or twice per average night. She has a TV on in her bedroom. Her family is complaining about her loud snoring. She lives with her 39 year old daughter, she also has a 43 year old daughter. Wake time is around 5:30. Bedtime is between 11 and 11:15. She had a home sleep test in 2016 which showed mild obstructive sleep apnea.  Her Past Medical History Is Significant For: Past Medical History:  Diagnosis Date  . Arthritis     Her Past Surgical History Is Significant For: Past Surgical History:  Procedure Laterality Date  . ABDOMINAL HYSTERECTOMY    . HYSTERECTOMY ABDOMINAL WITH SALPINGECTOMY  1997    Her Family History Is Significant For: Family History  Problem Relation Age of Onset  . Diabetes Mother   . Hypertension Mother    . Arthritis Mother   . Diabetes Father   . Kidney disease Father   . Heart disease Maternal Grandmother   . Diabetes Paternal Grandmother     Her Social History Is Significant For: Social History   Socioeconomic History  . Marital status: Single    Spouse name: Not on file  . Number of children: Not on file  . Years of education: Not on file  . Highest education level: Not on file  Occupational History  . Not on file  Social Needs  . Financial resource strain: Not on file  . Food insecurity:    Worry: Not on file    Inability: Not on file  . Transportation needs:    Medical: Not on file    Non-medical: Not on file  Tobacco Use  . Smoking status: Light Tobacco Smoker    Packs/day: 0.12    Years: 0.50    Pack years: 0.06    Types: Cigarettes  . Smokeless tobacco: Never Used  Substance and Sexual Activity  . Alcohol use: Yes    Alcohol/week: 0.6 oz    Types: 1 Glasses of wine per week    Comment: 1 glass a year  . Drug use: No  . Sexual activity: Never  Lifestyle  . Physical activity:    Days per week: Not on file    Minutes per session: Not on file  . Stress: Not on file  Relationships  . Social connections:    Talks on phone: Not  on file    Gets together: Not on file    Attends religious service: Not on file    Active member of club or organization: Not on file    Attends meetings of clubs or organizations: Not on file    Relationship status: Not on file  Other Topics Concern  . Not on file  Social History Narrative  . Not on file    Her Allergies Are:  Allergies  Allergen Reactions  . Flagyl [Metronidazole] Hives  :   Her Current Medications Are:  Outpatient Encounter Medications as of 06/19/2018  Medication Sig  . albuterol (PROVENTIL HFA;VENTOLIN HFA) 108 (90 Base) MCG/ACT inhaler Inhale 1-2 puffs into the lungs every 4 (four) hours as needed for wheezing or shortness of breath.  Marland Kitchen amoxicillin-clavulanate (AUGMENTIN) 875-125 MG tablet Take 1  tablet by mouth every 12 (twelve) hours for 10 days.  . diazepam (VALIUM) 5 MG tablet Take 1 tablet (5 mg total) by mouth every 12 (twelve) hours as needed (dizziness).  . fluticasone (FLONASE) 50 MCG/ACT nasal spray Place 1 spray into both nostrils 2 (two) times daily.  . meclizine (ANTIVERT) 25 MG tablet Take 1 tablet (25 mg total) by mouth 4 (four) times daily as needed for dizziness.  Marland Kitchen Phenylephrine HCl 5 MG TABS Take 1 tablet (5 mg total) by mouth 4 (four) times daily as needed for up to 7 days.  . ranitidine (ZANTAC) 150 MG tablet Take 1 tablet (150 mg total) by mouth 2 (two) times daily for 14 days.  . sodium chloride (OCEAN) 0.65 % SOLN nasal spray Place 2 sprays into both nostrils every 2 (two) hours while awake.  . [DISCONTINUED] cyclobenzaprine (FLEXERIL) 10 MG tablet Take 0.5-1 tablets (5-10 mg total) by mouth 3 (three) times daily as needed for muscle spasms.  . [DISCONTINUED] omeprazole (PRILOSEC) 20 MG capsule Take 1 capsule (20 mg total) by mouth daily.   No facility-administered encounter medications on file as of 06/19/2018.   :  Review of Systems:  Out of a complete 14 point review of systems, all are reviewed and negative with the exception of these symptoms as listed below:  Review of Systems  Neurological:       Pt presents today to discuss her sleep. Pt has had a sleep study in the past and was told "something" was wrong but never followed up. Pt endorses snoring.  Epworth Sleepiness Scale 0= would never doze 1= slight chance of dozing 2= moderate chance of dozing 3= high chance of dozing  Sitting and reading: 3 Watching TV: 3 Sitting inactive in a public place (ex. Theater or meeting): 2 As a passenger in a car for an hour without a break: 3 Lying down to rest in the afternoon: 3 Sitting and talking to someone: 2 Sitting quietly after lunch (no alcohol): 2 In a car, while stopped in traffic: 1 Total: 19     Objective:  Neurological Exam  Physical  Exam Physical Examination:   Vitals:   06/19/18 1532  BP: 130/88  Pulse: 92    General Examination: The patient is a very pleasant 52 y.o. female in no acute distress. She appears well-developed and well-nourished and well groomed.   HEENT: Normocephalic, atraumatic, pupils are equal, round and reactive to light and accommodation. Extraocular tracking is good without limitation to gaze excursion or nystagmus noted. Normal smooth pursuit is noted. Hearing is grossly intact. Face is symmetric with normal facial animation and normal facial sensation. Speech is clear with  no dysarthria noted. There is no hypophonia. There is no lip, neck/head, jaw or voice tremor. Neck is supple with full range of passive and active motion. There are no carotid bruits on auscultation. Oropharynx exam reveals: mild mouth dryness, adequate dental hygiene and mild airway crowding, due to smaller airway entry. Mallampati is class I. Tongue protrudes centrally and palate elevates symmetrically. Tonsils are small. Neck size is 15.5 inches. She has a Mild overbite. Nasal inspection reveals no significant nasal mucosal bogginess or redness and no septal deviation.   Chest: Clear to auscultation without wheezing, rhonchi or crackles noted.  Heart: S1+S2+0, regular and normal without murmurs, rubs or gallops noted.   Abdomen: Soft, non-tender and non-distended with normal bowel sounds appreciated on auscultation.  Extremities: There is trace pitting edema in the distal lower extremities bilaterally.  Skin: Warm and dry without trophic changes noted.  Musculoskeletal: exam reveals no obvious joint deformities, tenderness or joint swelling or erythema.   Neurologically:  Mental status: The patient is awake, alert and oriented in all 4 spheres. Her immediate and remote memory, attention, language skills and fund of knowledge are appropriate. There is no evidence of aphasia, agnosia, apraxia or anomia. Speech is clear with  normal prosody and enunciation. Thought process is linear. Mood is normal and affect is normal.  Cranial nerves II - XII are as described above under HEENT exam. In addition: shoulder shrug is normal with equal shoulder height noted. Motor exam: Normal bulk, strength and tone is noted. There is no drift, tremor or rebound. Romberg is negative. Reflexes are 2+ throughout. Fine motor skills and coordination: intact with normal finger taps, normal hand movements, normal rapid alternating patting, normal foot taps and normal foot agility.  Cerebellar testing: No dysmetria or intention tremor on finger to nose testing. Heel to shin is unremarkable bilaterally. There is no truncal or gait ataxia.  Sensory exam: intact to light touch in the upper and lower extremities.  Gait, station and balance: She stands easily. No veering to one side is noted. No leaning to one side is noted. Posture is age-appropriate and stance is narrow based. Gait shows normal stride length and normal pace. No problems turning are noted. Tandem walk is unremarkable.   Assessment and Plan:  In summary, Janne NapoleonCathy W Sedlak is a very pleasant 52 y.o.-year old female with an underlying medical history of asthma, arthritis, reflux disease, IBS and obesity, whose history and physical exam are concerning for obstructive sleep apnea (OSA). I had a long chat with the patient about my findings and the diagnosis of OSA, its prognosis and treatment options. We talked about medical treatments, surgical interventions and non-pharmacological approaches. I explained in particular the risks and ramifications of untreated moderate to severe OSA, especially with respect to developing cardiovascular disease down the Road, including congestive heart failure, difficult to treat hypertension, cardiac arrhythmias, or stroke. Even type 2 diabetes has, in part, been linked to untreated OSA. Symptoms of untreated OSA include daytime sleepiness, memory problems, mood  irritability and mood disorder such as depression and anxiety, lack of energy, as well as recurrent headaches, especially morning headaches. We talked about smoking cessation and trying to maintain a healthy lifestyle in general, as well as the importance of weight control. I encouraged the patient to eat healthy, exercise daily and keep well hydrated, to keep a scheduled bedtime and wake time routine, to not skip any meals and eat healthy snacks in between meals. I advised the patient not to drive when  feeling sleepy. I recommended the following at this time: sleep study with potential positive airway pressure titration. (We will score hypopneas at 3%).   I explained the sleep test procedure to the patient and also outlined possible surgical and non-surgical treatment options of OSA, including the use of a custom-made dental device (which would require a referral to a specialist dentist or oral surgeon), upper airway surgical options, such as pillar implants, radiofrequency surgery, tongue base surgery, and UPPP (which would involve a referral to an ENT surgeon). Rarely, jaw surgery such as mandibular advancement may be considered.  I also explained the CPAP treatment option to the patient, who indicated that she would be willing to try CPAP if the need arises. I explained the importance of being compliant with PAP treatment, not only for insurance purposes but primarily to improve Her symptoms, and for the patient's long term health benefit, including to reduce Her cardiovascular risks. I answered all her questions today and the patient was in agreement. I plan to see her back after the sleep study is completed and encouraged her to call with any interim questions, concerns, problems or updates.   Thank you very much for allowing me to participate in the care of this nice patient. If I can be of any further assistance to you please do not hesitate to call me at [phone].  Sincerely,   Stephanie Foley,  MD, PhD

## 2018-06-25 ENCOUNTER — Ambulatory Visit: Payer: Self-pay | Admitting: *Deleted

## 2018-06-25 DIAGNOSIS — Z8639 Personal history of other endocrine, nutritional and metabolic disease: Secondary | ICD-10-CM

## 2018-06-25 DIAGNOSIS — Z0000 Encounter for general adult medical examination without abnormal findings: Secondary | ICD-10-CM

## 2018-06-25 DIAGNOSIS — Z8719 Personal history of other diseases of the digestive system: Secondary | ICD-10-CM

## 2018-06-25 NOTE — Progress Notes (Signed)
Lab orders brought from GI MD. Labs modified to Exec panel. Added A1c under standing orders as pt due within 1-2 months for annual labs and A1c was elevated previously. Labs drawn non-fasting. Result review set up for 8/1. Will route results to ordering provider upon receipt- Nicanor AlconJyothia Nat Mann, MD [facility] [phone]

## 2018-06-26 LAB — CMP12+LP+TP+TSH+6AC+CBC/D/PLT
ALT: 23 IU/L (ref 0–32)
AST: 21 IU/L (ref 0–40)
Albumin/Globulin Ratio: 1.8 (ref 1.2–2.2)
Albumin: 4.4 g/dL (ref 3.5–5.5)
Alkaline Phosphatase: 70 IU/L (ref 39–117)
BUN/Creatinine Ratio: 16 (ref 9–23)
BUN: 12 mg/dL (ref 6–24)
Basophils Absolute: 0 10*3/uL (ref 0.0–0.2)
Basos: 1 %
Bilirubin Total: <0.2 mg/dL (ref 0.0–1.2)
Calcium: 9.5 mg/dL (ref 8.7–10.2)
Chloride: 105 mmol/L (ref 96–106)
Chol/HDL Ratio: 3.9 ratio (ref 0.0–4.4)
Cholesterol, Total: 185 mg/dL (ref 100–199)
Creatinine, Ser: 0.73 mg/dL (ref 0.57–1.00)
EOS (ABSOLUTE): 0.1 10*3/uL (ref 0.0–0.4)
Eos: 2 %
Estimated CHD Risk: 0.8 times avg. (ref 0.0–1.0)
Free Thyroxine Index: 1.9 (ref 1.2–4.9)
GFR calc Af Amer: 109 mL/min/{1.73_m2} (ref >59)
GFR calc non Af Amer: 94 mL/min/{1.73_m2} (ref >59)
GGT: 38 IU/L (ref 0–60)
Globulin, Total: 2.5 g/dL (ref 1.5–4.5)
Glucose: 120 mg/dL — ABNORMAL HIGH (ref 65–99)
HDL: 47 mg/dL (ref >39)
Hematocrit: 42.2 % (ref 34.0–46.6)
Hemoglobin: 13.5 g/dL (ref 11.1–15.9)
Immature Grans (Abs): 0 10*3/uL (ref 0.0–0.1)
Immature Granulocytes: 1 %
Iron: 91 ug/dL (ref 27–159)
LDH: 184 IU/L (ref 119–226)
LDL Calculated: 126 mg/dL — ABNORMAL HIGH (ref 0–99)
Lymphocytes Absolute: 1.6 10*3/uL (ref 0.7–3.1)
Lymphs: 25 %
MCH: 29.2 pg (ref 26.6–33.0)
MCHC: 32 g/dL (ref 31.5–35.7)
MCV: 91 fL (ref 79–97)
Monocytes Absolute: 0.4 10*3/uL (ref 0.1–0.9)
Monocytes: 6 %
Neutrophils Absolute: 4.3 10*3/uL (ref 1.4–7.0)
Neutrophils: 65 %
Phosphorus: 3.1 mg/dL (ref 2.5–4.5)
Platelets: 268 10*3/uL (ref 150–450)
Potassium: 4.2 mmol/L (ref 3.5–5.2)
RBC: 4.62 x10E6/uL (ref 3.77–5.28)
RDW: 12.8 % (ref 12.3–15.4)
Sodium: 142 mmol/L (ref 134–144)
T3 Uptake Ratio: 27 % (ref 24–39)
T4, Total: 7 ug/dL (ref 4.5–12.0)
TSH: 0.812 u[IU]/mL (ref 0.450–4.500)
Total Protein: 6.9 g/dL (ref 6.0–8.5)
Triglycerides: 61 mg/dL (ref 0–149)
Uric Acid: 3.8 mg/dL (ref 2.5–7.1)
VLDL Cholesterol Cal: 12 mg/dL (ref 5–40)
WBC: 6.5 10*3/uL (ref 3.4–10.8)

## 2018-06-26 LAB — HGB A1C W/O EAG: Hgb A1c MFr Bld: 6.1 % — ABNORMAL HIGH (ref 4.8–5.6)

## 2018-07-04 NOTE — Progress Notes (Signed)
Noted sleep study pending

## 2018-07-05 ENCOUNTER — Ambulatory Visit (INDEPENDENT_AMBULATORY_CARE_PROVIDER_SITE_OTHER): Payer: PRIVATE HEALTH INSURANCE | Admitting: Neurology

## 2018-07-05 DIAGNOSIS — G4733 Obstructive sleep apnea (adult) (pediatric): Secondary | ICD-10-CM

## 2018-07-05 DIAGNOSIS — R351 Nocturia: Secondary | ICD-10-CM

## 2018-07-05 DIAGNOSIS — R0681 Apnea, not elsewhere classified: Secondary | ICD-10-CM

## 2018-07-05 DIAGNOSIS — J4520 Mild intermittent asthma, uncomplicated: Secondary | ICD-10-CM

## 2018-07-05 DIAGNOSIS — R0683 Snoring: Secondary | ICD-10-CM

## 2018-07-05 DIAGNOSIS — G4720 Circadian rhythm sleep disorder, unspecified type: Secondary | ICD-10-CM

## 2018-07-05 DIAGNOSIS — R400 Somnolence: Secondary | ICD-10-CM

## 2018-07-05 DIAGNOSIS — E669 Obesity, unspecified: Secondary | ICD-10-CM

## 2018-07-05 NOTE — Progress Notes (Signed)
Late entry: Results reviewed with pt 07/02/18. Glucose elevated but improved from 3 wks previous. Labs were non-fasting as original orders were for CMET, CBC, and HFP and were modified to complete full annual panel for anticipated annual exam with pcp/gyn. LDL elevated. Diet and exercise recommendations discussed re: LDL, weight maintenance, A1c. Sleep study pending scheduling at time of review. Now scheduled for this evening. Routine f/u with pcp and keep scheduled f/u with GI. Copy of labs provided to pt. Results routed to pcp and ordering provider GI MD Johna RolesJyothia Mann per pt request. No further questions/concerns.

## 2018-07-09 ENCOUNTER — Telehealth: Payer: Self-pay

## 2018-07-09 ENCOUNTER — Ambulatory Visit: Payer: Self-pay | Admitting: Registered Nurse

## 2018-07-09 VITALS — BP 114/88 | HR 79 | Temp 99.0°F

## 2018-07-09 DIAGNOSIS — G4733 Obstructive sleep apnea (adult) (pediatric): Secondary | ICD-10-CM

## 2018-07-09 DIAGNOSIS — L2489 Irritant contact dermatitis due to other agents: Secondary | ICD-10-CM

## 2018-07-09 DIAGNOSIS — H65196 Other acute nonsuppurative otitis media, recurrent, bilateral: Secondary | ICD-10-CM

## 2018-07-09 MED ORDER — DIPHENHYDRAMINE HCL 2 % EX GEL: 1.0000 "application " | Freq: Two times a day (BID) | CUTANEOUS | 0 refills | Status: AC | PRN

## 2018-07-09 MED ORDER — CETIRIZINE HCL 10 MG PO TABS: 10.0000 mg | ORAL_TABLET | Freq: Two times a day (BID) | ORAL | 0 refills | Status: DC | PRN

## 2018-07-09 MED ORDER — AMOXICILLIN-POT CLAVULANATE 875-125 MG PO TABS: 1.0000 | ORAL_TABLET | Freq: Two times a day (BID) | ORAL | 0 refills | Status: AC

## 2018-07-09 MED ORDER — HYDROCORTISONE 1 % EX LOTN: 1.0000 "application " | TOPICAL_LOTION | Freq: Two times a day (BID) | CUTANEOUS | 0 refills | Status: AC

## 2018-07-09 NOTE — Progress Notes (Signed)
Subjective:    Patient ID: Stephanie Chandler, female    DOB: 09-09-1966, 52 y.o.   MRN: [id number]  52y/o African-American established female pt c/o facial rash that appeared this morning just prior to arriving at work. Believes it is related to tape that was placed on her face during sleep study on Friday night, 4 days ago. 07/05/2018 I slept really well during my sleep study and still waiting to hear results I was told one week to receive them   Adhesive pattern of hyperpigmentation and mild dryness noted to bilateral cheeks. Pt denies this being present yesterday or upon waking this morning. Denies it spreading since noticing it. Is not itchy, raised, erythematous, draining, painful, dysphagia,dysphasia, wheezing, tongue swelling. No previous reaction to tape or adhesives.   Would also like ears checked out as still hurting no drainage/trauma/erythema.  Not using nasal saline, flonase, antihistamine or zantac every day just prn.     Review of Systems  Constitutional: Negative for activity change, appetite change, chills, diaphoresis, fatigue, fever and unexpected weight change.  HENT: Positive for ear pain and postnasal drip. Negative for congestion, dental problem, drooling, ear discharge, facial swelling, hearing loss, mouth sores, nosebleeds, rhinorrhea, sinus pressure, sinus pain, sneezing, sore throat, tinnitus, trouble swallowing and voice change.   Eyes: Negative for photophobia, pain, discharge, redness, itching and visual disturbance.  Respiratory: Negative for cough, choking, chest tightness, shortness of breath, wheezing and stridor.   Cardiovascular: Negative for chest pain, palpitations and leg swelling.  Gastrointestinal: Negative for abdominal distention, abdominal pain, blood in stool, constipation, diarrhea, nausea and vomiting.  Endocrine: Negative for cold intolerance and heat intolerance.  Genitourinary: Negative for difficulty urinating, dysuria and hematuria.   Musculoskeletal: Negative for arthralgias, back pain, gait problem, joint swelling, myalgias, neck pain and neck stiffness.  Skin: Positive for rash. Negative for color change, pallor and wound.  Allergic/Immunologic: Positive for environmental allergies. Negative for food allergies.  Neurological: Negative for dizziness, tremors, seizures, syncope, facial asymmetry, speech difficulty, weakness, light-headedness, numbness and headaches.  Hematological: Negative for adenopathy. Does not bruise/bleed easily.  Psychiatric/Behavioral: Positive for sleep disturbance. Negative for agitation, behavioral problems and confusion.       Objective:   Physical Exam  Constitutional: She is oriented to person, place, and time. Vital signs are normal. She appears well-developed and well-nourished. She is active and cooperative.  Non-toxic appearance. She does not have a sickly appearance. She does not appear ill. No distress.  HENT:  Head: Normocephalic and atraumatic.  Right Ear: Hearing, external ear and ear canal normal. Tympanic membrane is erythematous and bulging. A middle ear effusion is present.  Left Ear: Hearing, external ear and ear canal normal. Tympanic membrane is erythematous and bulging. A middle ear effusion is present.  Nose: Mucosal edema and rhinorrhea present. No nose lacerations, sinus tenderness, nasal deformity, septal deviation or nasal septal hematoma. No epistaxis.  No foreign bodies. Right sinus exhibits no maxillary sinus tenderness and no frontal sinus tenderness. Left sinus exhibits no maxillary sinus tenderness and no frontal sinus tenderness.  Mouth/Throat: Uvula is midline and mucous membranes are normal. Mucous membranes are not pale, not dry and not cyanotic. She does not have dentures. No oral lesions. No trismus in the jaw. Normal dentition. No dental abscesses, uvula swelling, lacerations or dental caries. Posterior oropharyngeal edema and posterior oropharyngeal erythema  present. No oropharyngeal exudate or tonsillar abscesses.  Bilateral allergic shiners; cobblestoning posterior pharynx; bilateral TMs air fluid level clear TM erythematous bulging 60%  Eyes: Pupils are equal, round, and reactive to light. Conjunctivae, EOM and lids are normal. Right eye exhibits no chemosis, no discharge, no exudate and no hordeolum. No foreign body present in the right eye. Left eye exhibits no chemosis, no discharge, no exudate and no hordeolum. No foreign body present in the left eye. Right conjunctiva is not injected. Right conjunctiva has no hemorrhage. Left conjunctiva is not injected. Left conjunctiva has no hemorrhage. No scleral icterus. Right eye exhibits normal extraocular motion and no nystagmus. Left eye exhibits normal extraocular motion and no nystagmus. Right pupil is round and reactive. Left pupil is round and reactive. Pupils are equal.  Neck: Trachea normal, normal range of motion and phonation normal. Neck supple. No tracheal tenderness, no spinous process tenderness and no muscular tenderness present. No neck rigidity. No tracheal deviation, no edema, no erythema and normal range of motion present. No thyroid mass and no thyromegaly present.  Cardiovascular: Normal rate, regular rhythm, S1 normal, S2 normal, normal heart sounds and intact distal pulses. PMI is not displaced. Exam reveals no gallop and no friction rub.  No murmur heard. Pulmonary/Chest: Effort normal and breath sounds normal. No accessory muscle usage or stridor. No respiratory distress. She has no decreased breath sounds. She has no wheezes. She has no rhonchi. She has no rales. She exhibits no tenderness.  No cough observed in exam room; spoke full sentences without difficulty  Abdominal: Soft. Normal appearance. She exhibits no distension, no fluid wave and no ascites. There is no rigidity and no guarding.  Musculoskeletal: Normal range of motion. She exhibits no edema or tenderness.       Right  shoulder: Normal.       Left shoulder: Normal.       Right elbow: Normal.      Left elbow: Normal.       Right hip: Normal.       Left hip: Normal.       Right knee: Normal.       Left knee: Normal.       Cervical back: Normal.       Thoracic back: Normal.       Lumbar back: Normal.       Right hand: Normal.       Left hand: Normal.  Lymphadenopathy:       Head (right side): No submental, no submandibular, no tonsillar, no preauricular, no posterior auricular and no occipital adenopathy present.       Head (left side): No submental, no submandibular, no tonsillar, no preauricular, no posterior auricular and no occipital adenopathy present.    She has no cervical adenopathy.       Right cervical: No superficial cervical, no deep cervical and no posterior cervical adenopathy present.      Left cervical: No superficial cervical, no deep cervical and no posterior cervical adenopathy present.  Neurological: She is alert and oriented to person, place, and time. She has normal strength. She is not disoriented. She displays no atrophy and no tremor. No cranial nerve deficit or sensory deficit. She exhibits normal muscle tone. She displays no seizure activity. Coordination and gait normal. GCS eye subscore is 4. GCS verbal subscore is 5. GCS motor subscore is 6.  In/out of chair/on/off exam table without difficulty; gait sure and steady in hallway  Skin: Skin is warm, dry and intact. Capillary refill takes less than 2 seconds. Rash noted. No abrasion, no bruising, no burn, no ecchymosis, no laceration, no lesion, no petechiae  and no purpura noted. Rash is macular, papular and maculopapular. Rash is not nodular, not pustular, not vesicular and not urticarial. She is not diaphoretic. No cyanosis or erythema. No pallor. Nails show no clubbing.     Macular hyperpigmented papular rash bilateral cheeks grouped 2cmx1cm dry fine scale  Psychiatric: She has a normal mood and affect. Her speech is normal and  behavior is normal. Judgment and thought content normal. She is not actively hallucinating. Cognition and memory are normal. She is attentive.  Nursing note and vitals reviewed.    Reviewed sleep study results with patient and discussed with her she should expect call to schedule titration sleep study/mask fitting soon.  Normal spo2 RA greater than 90% and AHI over 20 equates to OSA diagnosis.  Patient verbalized understanding information/instructions, agreed with plan of care and had no further questions at this time.  Patient referred by PCP NP, seen by me on 06/19/18, diagnostic PSG on 07/05/18.  "notify the patient that the recent sleep study showed overall mild obstructive sleep apnea, severe in REM and duiring supine sleep with a total AHI of 10.7/hour, REM AHI of 53.8/hour, supine AHI of 30/hour and O2 nadir of 81%. I recommend treatment for this in the form of CPAP. This will require a repeat sleep study for proper titration and mask fitting and correct monitoring of the oxygen saturations. Please explain to patient. I have placed an order in the chart. Thanks.Huston FoleySaima Athar, MD, PhD" [facility]     Assessment & Plan:  A-contact dermatitis, bilateral otitis media acute, OSA  P-Supportive treatment.  augmentin 875mg  po BID x 10 days #20 RF0 dispensed from [facility] to patient.Discussed with patient need to control post nasal drip otherwise ear infections will reoccur No evidence of invasive bacterial infection, non toxic and well hydrated.  This is most likely self limiting viral infection.  I do not see where any further testing or imaging is necessary at this time.   I will suggest supportive care, rest, good hygiene and encourage the patient to take adequate fluids.  The patient is to return to clinic or EMERGENCY ROOM if symptoms worsen or change significantly e.g. ear pain, fever, purulent discharge from ears or bleeding.  exitcare handout otitis media   Patient verbalized  agreement and understanding of treatment plan.    Take zantac 150mg  po BID prn itching at home until rash/itching resolved.  Zyrtec 10mg  po BID prn itching.  Hydrocortisone 1% lotion apply BID prn topical rash x 7 days 3 UD given from clinic stock;  calagel topical BID prn itching 3 UD given from clinic stock  Medication as directed. Symptomatic therapy suggested. Warm to cool water soaks and/or oatmeal baths. Call or return to clinic as needed if these symptoms worsen or fail to improve as anticipated. Exitcare handout on contact dermatitis given to patient. Patient verbalized agreement and understanding of treatment plan.  P2: Avoidance and hand washing.   Patient may use normal saline nasal spray 2 sprays each nostril q2h wa as needed. flonase 50mcg 1 spray each nostril BID .  Patient denied personal or family history of ENT cancer.  OTC antihistamine of choice zyrtec 10mg  po daily.  Avoid triggers if possible.  Shower prior to bedtime if exposed to triggers.  If allergic dust/dust mites recommend mattress/pillow covers/encasements; washing linens, vacuuming, sweeping, dusting weekly.  Call or return to clinic as needed if these symptoms worsen or fail to improve as anticipated.   Exitcare handout on nonallergic  rhinitis and sinus rinse given to patient.  Patient verbalized understanding of instructions, agreed with plan of care and had no further questions at this time.  P2:  Avoidance and hand washing.  Patient to schedule follow up appt for titration and fitting cpap equipment.  Consider weight loss. exitcare handout on obstructive sleep apnea. Patient verbalized understanding information/instructions agreed with plan of care and had no further questions at this time.

## 2018-07-09 NOTE — Addendum Note (Signed)
Addended by: Huston FoleyATHAR, Annalisse Minkoff on: 07/09/2018 09:14 AM   Modules accepted: Orders

## 2018-07-09 NOTE — Telephone Encounter (Signed)
I called pt. I advised pt that Dr. Frances FurbishAthar reviewed their sleep study results and found that pt has overall mild osa but severe osa in her REM sleep and during supine sleep and recommends that pt be treated with a cpap. Dr. Frances FurbishAthar recommends that pt return for a repeat sleep study in order to properly titrate the cpap and ensure a good mask fit. Pt is agreeable to returning for a titration study. I advised pt that our sleep lab will file with pt's insurance and call pt to schedule the sleep study when we hear back from the pt's insurance regarding coverage of this sleep study. Pt verbalized understanding of results. Pt had no questions at this time but was encouraged to call back if questions arise.

## 2018-07-09 NOTE — Progress Notes (Signed)
Patient referred by PCP NP, seen by me on 06/19/18, diagnostic PSG on 07/05/18.   Please call and notify the patient that the recent sleep study showed overall mild obstructive sleep apnea, severe in REM and duiring supine sleep with a total AHI of 10.7/hour, REM AHI of 53.8/hour, supine AHI of 30/hour and O2 nadir of 81%. I recommend treatment for this in the form of CPAP. This will require a repeat sleep study for proper titration and mask fitting and correct monitoring of the oxygen saturations. Please explain to patient. I have placed an order in the chart. Thanks.  Huston FoleySaima Rayven Hendrickson, MD, PhD [facility])

## 2018-07-09 NOTE — Patient Instructions (Addendum)
Sleep Apnea Sleep apnea is a condition in which breathing pauses or becomes shallow during sleep. Episodes of sleep apnea usually last 10 seconds or longer, and they may occur as many as 20 times an hour. Sleep apnea disrupts your sleep and keeps your body from getting the rest that it needs. This condition can increase your risk of certain health problems, including:  Heart attack.  Stroke.  Obesity.  Diabetes.  Heart failure.  Irregular heartbeat.  There are three kinds of sleep apnea:  Obstructive sleep apnea. This kind is caused by a blocked or collapsed airway.  Central sleep apnea. This kind happens when the part of the brain that controls breathing does not send the correct signals to the muscles that control breathing.  Mixed sleep apnea. This is a combination of obstructive and central sleep apnea.  What are the causes? The most common cause of this condition is a collapsed or blocked airway. An airway can collapse or become blocked if:  Your throat muscles are abnormally relaxed.  Your tongue and tonsils are larger than normal.  You are overweight.  Your airway is smaller than normal.  What increases the risk? This condition is more likely to develop in people who:  Are overweight.  Smoke.  Have a smaller than normal airway.  Are elderly.  Are female.  Drink alcohol.  Take sedatives or tranquilizers.  Have a family history of sleep apnea.  What are the signs or symptoms? Symptoms of this condition include:  Trouble staying asleep.  Daytime sleepiness and tiredness.  Irritability.  Loud snoring.  Morning headaches.  Trouble concentrating.  Forgetfulness.  Decreased interest in sex.  Unexplained sleepiness.  Mood swings.  Personality changes.  Feelings of depression.  Waking up often during the night to urinate.  Dry mouth.  Sore throat.  How is this diagnosed? This condition may be diagnosed with:  A medical history.  A  physical exam.  A series of tests that are done while you are sleeping (sleep study). These tests are usually done in a sleep lab, but they may also be done at home.  How is this treated? Treatment for this condition aims to restore normal breathing and to ease symptoms during sleep. It may involve managing health issues that can affect breathing, such as high blood pressure or obesity. Treatment may include:  Sleeping on your side.  Using a decongestant if you have nasal congestion.  Avoiding the use of depressants, including alcohol, sedatives, and narcotics.  Losing weight if you are overweight.  Making changes to your diet.  Quitting smoking.  Using a device to open your airway while you sleep, such as: ? An oral appliance. This is a custom-made mouthpiece that shifts your lower jaw forward. ? A continuous positive airway pressure (CPAP) device. This device delivers oxygen to your airway through a mask. ? A nasal expiratory positive airway pressure (EPAP) device. This device has valves that you put into each nostril. ? A bi-level positive airway pressure (BPAP) device. This device delivers oxygen to your airway through a mask.  Surgery if other treatments do not work. During surgery, excess tissue is removed to create a wider airway.  It is important to get treatment for sleep apnea. Without treatment, this condition can lead to:  High blood pressure.  Coronary artery disease.  (Men) An inability to achieve or maintain an erection (impotence).  Reduced thinking abilities.  Follow these instructions at home:  Make any lifestyle changes that your health   care provider recommends.  Eat a healthy, well-balanced diet.  Take over-the-counter and prescription medicines only as told by your health care provider.  Avoid using depressants, including alcohol, sedatives, and narcotics.  Take steps to lose weight if you are overweight.  If you were given a device to open your  airway while you sleep, use it only as told by your health care provider.  Do not use any tobacco products, such as cigarettes, chewing tobacco, and e-cigarettes. If you need help quitting, ask your health care provider.  Keep all follow-up visits as told by your health care provider. This is important. Contact a health care provider if:  The device that you received to open your airway during sleep is uncomfortable or does not seem to be working.  Your symptoms do not improve.  Your symptoms get worse. Get help right away if:  You develop chest pain.  You develop shortness of breath.  You develop discomfort in your back, arms, or stomach.  You have trouble speaking.  You have weakness on one side of your body.  You have drooping in your face. These symptoms may represent a serious problem that is an emergency. Do not wait to see if the symptoms will go away. Get medical help right away. Call your local emergency services (911 in the [place] Do not drive yourself to the hospital. This information is not intended to replace advice given to you by your health care provider. Make sure you discuss any questions you have with your health care provider. Document Released: 11/03/2002 Document Revised: 07/09/2016 Document Reviewed: 08/23/2015 Elsevier Interactive Patient Education  2018 [employer] Nonallergic Rhinitis  1. Nonallergic rhinitis is a term used by allergist to describe inflammation in the nose that is not due to an allergic source (i.e., pollens, mold, animal dander or dust mites). 2. Nonallergic rhinitis can mimic many of the symptoms caused by allergies.  This includes a runny nose ("rhinorrhea"), sneezing, congestion, ear fullness and post nasal drip. 3. These symptoms usually occur year round but can be made worse by many environmental factors including weather change, irritants such as cigarette smoke, fumes, perfumes, detergents and many others. These are not allergens,  but are irritants, and do not cause the formation of antibodies like true allergens.  For this reason most people with nonallergic rhinitis have negative skin tests. 4. Unfortunately, we have a poor understanding of what causes nonallergic rhinitis but certain factors such as deviated septum, sinusitis and nasal polyps may contribute to the symptoms.  Due to the poor understanding of what causes nonallergic rhinitis it cannot be cured and our treatment is only symptomatic to control symptoms. 5. Important factors in the successful treatment of nonallergic rhinitis include avoidance of the irritants that cause symptoms; for example, people who continue to smoke may never improve.  Continuous therapy works better than intermittent.  This may mean taking medications once daily or 3-4 times a day. Helpful treatments include nasal saline lavage, nasal ipratropium (Atrovent), nasal steroids, and decongestants.  Pure antihistamines don't seem to work very well.  Immunotherapy (allergy shots) has no role in the treatment of nonallergic rhinitis.  Several medications may have to be tried before a good combination is found for you.  These medications, in general, are safe for long term use.  Tolerance may develop to the therapeutic effects of these medications: Frequently changing between several helpful medications can prevent this should it occur.Otitis Media, Adult Otitis media occurs when there is inflammation and fluid  in the middle ear. Your middle ear is a part of the ear that contains bones for hearing as well as air that helps send sounds to your brain. What are the causes? This condition is caused by a blockage in the eustachian tube. This tube drains fluid from the ear to the back of the nose (nasopharynx). A blockage in this tube can be caused by an object or by swelling (edema) in the tube. Problems that can cause a blockage include:  A cold or other upper respiratory infection.  Allergies.  An  irritant, such as tobacco smoke.  Enlarged adenoids. The adenoids are areas of soft tissue located high in the back of the throat, behind the nose and the roof of the mouth.  A mass in the nasopharynx.  Damage to the ear caused by pressure changes (barotrauma).  What are the signs or symptoms? Symptoms of this condition include:  Ear pain.  A fever.  Decreased hearing.  A headache.  Tiredness (lethargy).  Fluid leaking from the ear.  Ringing in the ear.  How is this diagnosed? This condition is diagnosed with a physical exam. During the exam your health care provider will use an instrument called an otoscope to look into your ear and check for redness, swelling, and fluid. He or she will also ask about your symptoms. Your health care provider may also order tests, such as:  A test to check the movement of the eardrum (pneumatic otoscopy). This test is done by squeezing a small amount of air into the ear.  A test that changes air pressure in the middle ear to check how well the eardrum moves and whether the eustachian tube is working (tympanogram).  How is this treated? This condition usually goes away on its own within 3-5 days. But if the condition is caused by a bacteria infection and does not go away own its own, or keeps coming back, your health care provider may:  Prescribe antibiotic medicines to treat the infection.  Prescribe or recommend medicines to control pain.  Follow these instructions at home:  Take over-the-counter and prescription medicines only as told by your health care provider.  If you were prescribed an antibiotic medicine, take it as told by your health care provider. Do not stop taking the antibiotic even if you start to feel better.  Keep all follow-up visits as told by your health care provider. This is important. Contact a health care provider if:  You have bleeding from your nose.  There is a lump on your neck.  You are not getting  better in 5 days.  You feel worse instead of better. Get help right away if:  You have severe pain that is not controlled with medicine.  You have swelling, redness, or pain around your ear.  You have stiffness in your neck.  A part of your face is paralyzed.  The bone behind your ear (mastoid) is tender when you touch it.  You develop a severe headache. Summary  Otitis media is redness, soreness, and swelling of the middle ear.  This condition usually goes away on its own within 3-5 days.  If the problem does not go away in 3-5 days, your health care provider may prescribe or recommend medicines to treat your symptoms.  If you were prescribed an antibiotic medicine, take it as told by your health care provider. This information is not intended to replace advice given to you by your health care provider. Make sure you discuss  any questions you have with your health care provider. Document Released: 08/18/2004 Document Revised: 11/03/2016 Document Reviewed: 11/03/2016 Elsevier Interactive Patient Education  2018 [employer] Contact Dermatitis Dermatitis is redness, soreness, and swelling (inflammation) of the skin. Contact dermatitis is a reaction to certain substances that touch the skin. There are two types of contact dermatitis:  Irritant contact dermatitis. This type is caused by something that irritates your skin, such as dry hands from washing them too much. This type does not require previous exposure to the substance for a reaction to occur. This type is more common.  Allergic contact dermatitis. This type is caused by a substance that you are allergic to, such as a nickel allergy or poison ivy. This type only occurs if you have been exposed to the substance (allergen) before. Upon a repeat exposure, your body reacts to the substance. This type is less common.  What are the causes? Many different substances can cause contact dermatitis. Irritant contact dermatitis is  most commonly caused by exposure to:  Makeup.  Soaps.  Detergents.  Bleaches.  Acids.  Metal salts, such as nickel.  Allergic contact dermatitis is most commonly caused by exposure to:  Poisonous plants.  Chemicals.  Jewelry.  Latex.  Medicines.  Preservatives in products, such as clothing.  What increases the risk? This condition is more likely to develop in:  People who have jobs that expose them to irritants or allergens.  People who have certain medical conditions, such as asthma or eczema.  What are the signs or symptoms? Symptoms of this condition may occur anywhere on your body where the irritant has touched you or is touched by you. Symptoms include:  Dryness or flaking.  Redness.  Cracks.  Itching.  Pain or a burning feeling.  Blisters.  Drainage of small amounts of blood or clear fluid from skin cracks.  With allergic contact dermatitis, there may also be swelling in areas such as the eyelids, mouth, or genitals. How is this diagnosed? This condition is diagnosed with a medical history and physical exam. A patch skin test may be performed to help determine the cause. If the condition is related to your job, you may need to see an occupational medicine specialist. How is this treated? Treatment for this condition includes figuring out what caused the reaction and protecting your skin from further contact. Treatment may also include:  Steroid creams or ointments. Oral steroid medicines may be needed in more severe cases.  Antibiotics or antibacterial ointments, if a skin infection is present.  Antihistamine lotion or an antihistamine taken by mouth to ease itching.  A bandage (dressing).  Follow these instructions at home: Skin Care  Moisturize your skin as needed.  Apply cool compresses to the affected areas.  Try taking a bath with: ? Epsom salts. Follow the instructions on the packaging. You can get these at your local pharmacy or  grocery store. ? Baking soda. Pour a small amount into the bath as directed by your health care provider. ? Colloidal oatmeal. Follow the instructions on the packaging. You can get this at your local pharmacy or grocery store.  Try applying baking soda paste to your skin. Stir water into baking soda until it reaches a paste-like consistency.  Do not scratch your skin.  Bathe less frequently, such as every other day.  Bathe in lukewarm water. Avoid using hot water. Medicines  Take or apply over-the-counter and prescription medicines only as told by your health care provider.  If you  were prescribed an antibiotic medicine, take or apply your antibiotic as told by your health care provider. Do not stop using the antibiotic even if your condition starts to improve. General instructions  Keep all follow-up visits as told by your health care provider. This is important.  Avoid the substance that caused your reaction. If you do not know what caused it, keep a journal to try to track what caused it. Write down: ? What you eat. ? What cosmetic products you use. ? What you drink. ? What you wear in the affected area. This includes jewelry.  If you were given a dressing, take care of it as told by your health care provider. This includes when to change and remove it. Contact a health care provider if:  Your condition does not improve with treatment.  Your condition gets worse.  You have signs of infection such as swelling, tenderness, redness, soreness, or warmth in the affected area.  You have a fever.  You have new symptoms. Get help right away if:  You have a severe headache, neck pain, or neck stiffness.  You vomit.  You feel very sleepy.  You notice red streaks coming from the affected area.  Your bone or joint underneath the affected area becomes painful after the skin has healed.  The affected area turns darker.  You have difficulty breathing. This information is not  intended to replace advice given to you by your health care provider. Make sure you discuss any questions you have with your health care provider. Document Released: 11/10/2000 Document Revised: 04/20/2016 Document Reviewed: 03/31/2015 Elsevier Interactive Patient Education  2018 [employer]  Sinus Rinse What is a sinus rinse? A sinus rinse is a simple home treatment that is used to rinse your sinuses with a sterile mixture of salt and water (saline solution). Sinuses are air-filled spaces in your skull behind the bones of your face and forehead that open into your nasal cavity. You will use the following:  Saline solution.  Neti pot or spray bottle. This releases the saline solution into your nose and through your sinuses. Neti pots and spray bottles can be purchased at [facility] local pharmacy, a health food store, or online.  When would I do a sinus rinse? A sinus rinse can help to clear mucus, dirt, dust, or pollen from the nasal cavity. You may do a sinus rinse when you have a cold, a virus, nasal allergy symptoms, a sinus infection, or stuffiness in the nose or sinuses. If you are considering a sinus rinse:  Ask your child's health care provider before performing a sinus rinse on your child.  Do not do a sinus rinse if you have had ear or nasal surgery, ear infection, or blocked ears.  How do I do a sinus rinse?  Wash your hands.  Disinfect your device according to the directions provided and then dry it.  Use the solution that comes with your device or one that is sold separately in stores. Follow the mixing directions on the package.  Fill your device with the amount of saline solution as directed by the device instructions.  Stand over a sink and tilt your head sideways over the sink.  Place the spout of the device in your upper nostril (the one closer to the ceiling).  Gently pour or squeeze the saline solution into the nasal cavity. The liquid should drain to the lower  nostril if you are not overly congested.  Gently blow your nose. Blowing too  hard may cause ear pain.  Repeat in the other nostril.  Clean and rinse your device with clean water and then air-dry it. Are there risks of a sinus rinse? Sinus rinse is generally very safe and effective. However, there are a few risks, which include:  A burning sensation in the sinuses. This may happen if you do not make the saline solution as directed. Make sure to follow all directions when making the saline solution.  Infection from contaminated water. This is rare, but possible.  Nasal irritation.  This information is not intended to replace advice given to you by your health care provider. Make sure you discuss any questions you have with your health care provider. Document Released: 06/10/2014 Document Revised: 10/10/2016 Document Reviewed: 03/31/2014 Elsevier Interactive Patient Education  2017 [employer].

## 2018-07-09 NOTE — Procedures (Signed)
PATIENT'S NAME:  Stephanie Chandler, Amaira DOB:      May 26, 1966      MR#:    [id number]     DATE OF RECORDING: 07/05/2018 REFERRING M.D.:  Albina Billetina Betancourt, NP Study Performed:   Baseline Polysomnogram HISTORY: 52 year old woman with a history of asthma, arthritis, reflux disease, IBS and obesity, who reports snoring and excessive daytime somnolence as well as witnessed apneic breathing pauses while asleep per daughter's report. The patient endorsed the Epworth Sleepiness Scale at 19 points. The patient's weight 202 pounds with a height of 61 (inches), resulting in a BMI of 38.3 kg/m2. The patient's neck circumference measured 15.5 inches.  CURRENT MEDICATIONS: Proventil, Augmentin, Valium, Flonase, Antivert, Phnylephrine, Zantac, Ocean (nasal spray)   PROCEDURE:  This is a multichannel digital polysomnogram utilizing the Somnostar 11.2 system.  Electrodes and sensors were applied and monitored per AASM Specifications.   EEG, EOG, Chin and Limb EMG, were sampled at 200 Hz.  ECG, Snore and Nasal Pressure, Thermal Airflow, Respiratory Effort, CPAP Flow and Pressure, Oximetry was sampled at 50 Hz. Digital video and audio were recorded.      BASELINE STUDY  Lights Out was at 20:48 and Lights On at 04:59.  Total recording time (TRT) was 491 minutes, with a total sleep time (TST) of  320.5 minutes.   The patient's sleep latency was 111 minutes, which is markedly delayed. REM latency was 108.5 minutes.  The sleep efficiency was 65.3%, which is reduced.     SLEEP ARCHITECTURE: WASO (Wake after sleep onset) was 63 minutes with one longer period of wakefulness and otherwise mild sleep fragmentation noted.  There were 11.5 minutes in Stage N1, 205 minutes Stage N2, 65 minutes Stage N3 and 39 minutes in Stage REM.  The percentage of Stage N1 was 3.6%, Stage N2 was 64.%, which is increased, Stage N3 was 20.3%, which is normal, and Stage R (REM sleep) was 12.2%, which is reduced. The arousals were noted as: 44 were spontaneous, 0  were associated with PLMs, 19 were associated with respiratory events.  RESPIRATORY ANALYSIS:  There were a total of 57 respiratory events:  50 obstructive apneas, 0 central apneas and 0 mixed apneas with a total of 50 apneas and an apnea index (AI) of 9.4 /hour. There were 7 hypopneas with a hypopnea index of 1.3 /hour. The patient also had 0 respiratory event related arousals (RERAs).      The total APNEA/HYPOPNEA INDEX (AHI) was 10.7./hour and the total RESPIRATORY DISTURBANCE INDEX was 0. 10.7 /hour.  35 events occurred in REM sleep and 12 events in NREM. The REM AHI was 53.8 /hour, versus a non-REM AHI of 4.7. The patient spent 106 minutes of total sleep time in the supine position and 215 minutes in non-supine.. The supine AHI was 30.0/hour versus a non-supine AHI of 1.1.  OXYGEN SATURATION & C02:  The Wake baseline 02 saturation was 95%, with the lowest being 81%. Time spent below 89% saturation equaled 8 minutes.  PERIODIC LIMB MOVEMENTS: The patient had a total of 0 Periodic Limb Movements.  The Periodic Limb Movement (PLM) index was 0 and the PLM Arousal index was 0/hour.  Audio and video analysis did not show any abnormal or unusual movements, behaviors, phonations or vocalizations. The patient took 2 bathroom breaks. Mild to moderate snoring was noted. The EKG was in keeping with normal sinus rhythm (NSR).  Post-study, the patient indicated that sleep was the same as usual.   IMPRESSION:  1. Obstructive Sleep Apnea(OSA) 2.  Dysfunctions associated with sleep stages or arousal from sleep  RECOMMENDATIONS:  1. This study demonstrates overall mild obstructive sleep apnea, severe in REM and duiring supine sleep with a total AHI of 10.7/hour, REM AHI of 53.8/hour, supine AHI of 30/hour and O2 nadir of 81%. Given the patient's medical history and sleep related complaints, treatment with positive airway pressure is a good choice; a full-night CPAP titration study is recommended to optimize  therapy. Other treatment options may include avoidance of supine sleep position along with weight loss, upper airway or jaw surgery in selected patients or the use of an oral appliance in certain patients. ENT evaluation and/or consultation with a maxillofacial surgeon or dentist may be feasible in some instances.    2. This study shows sleep fragmentation and abnormal sleep stage percentages; these are nonspecific findings and per se do not signify an intrinsic sleep disorder or a cause for the patient's sleep-related symptoms. Causes include (but are not limited to) the first night effect of the sleep study, circadian rhythm disturbances, medication effect or an underlying mood disorder or medical problem.  3. The patient should be cautioned not to drive, work at heights, or operate dangerous or heavy equipment when tired or sleepy. Review and reiteration of good sleep hygiene measures should be pursued with any patient. 4. The [facility] for discussion of the test results, symptom and treatment compliance review, further management strategies, etc. The referring provider will be notified of the test results.  I certify that I have reviewed the entire raw data recording prior to the issuance of this report in accordance with the Standards of Accreditation of the American Academy of Sleep Medicine (AASM)   Huston FoleySaima Hy Swiatek, MD, PhD Diplomat, American Board of Neurology and Sleep Medicine (Neurology and Sleep Medicine)

## 2018-07-09 NOTE — Telephone Encounter (Signed)
-----   Message from Huston FoleySaima Athar, MD sent at 07/09/2018  9:14 AM EDT ----- Patient referred by PCP NP, seen by me on 06/19/18, diagnostic PSG on 07/05/18.   Please call and notify the patient that the recent sleep study showed overall mild obstructive sleep apnea, severe in REM and duiring supine sleep with a total AHI of 10.7/hour, REM AHI of 53.8/hour, supine AHI of 30/hour and O2 nadir of 81%. I recommend treatment for this in the form of CPAP. This will require a repeat sleep study for proper titration and mask fitting and correct monitoring of the oxygen saturations. Please explain to patient. I have placed an order in the chart. Thanks.  Huston FoleySaima Athar, MD, PhD [facility])

## 2018-08-09 ENCOUNTER — Ambulatory Visit (INDEPENDENT_AMBULATORY_CARE_PROVIDER_SITE_OTHER): Payer: PRIVATE HEALTH INSURANCE | Admitting: Neurology

## 2018-08-09 DIAGNOSIS — J4520 Mild intermittent asthma, uncomplicated: Secondary | ICD-10-CM

## 2018-08-09 DIAGNOSIS — R400 Somnolence: Secondary | ICD-10-CM

## 2018-08-09 DIAGNOSIS — G4720 Circadian rhythm sleep disorder, unspecified type: Secondary | ICD-10-CM

## 2018-08-09 DIAGNOSIS — E669 Obesity, unspecified: Secondary | ICD-10-CM

## 2018-08-09 DIAGNOSIS — R351 Nocturia: Secondary | ICD-10-CM

## 2018-08-09 DIAGNOSIS — G4733 Obstructive sleep apnea (adult) (pediatric): Secondary | ICD-10-CM

## 2018-08-12 NOTE — Addendum Note (Signed)
Addended by: Huston FoleyATHAR, Wendelin Bradt on: 08/12/2018 08:29 AM   Modules accepted: Orders

## 2018-08-12 NOTE — Procedures (Signed)
S PATIENT'S NAME:  Stephanie Chandler, Stephanie Chandler DOB:      02-24-1966      MR#:    [id number]     DATE OF RECORDING: 08/09/2018 REFERRING M.D.:  Albina Billet, NP Study Performed:   CPAP  Titration HISTORY: 52 year old woman with a history of asthma, arthritis, reflux disease, IBS and obesity, who presents for a full night PAP titration study to treat her OSA. Her baseline PSG from 07/05/18 showed a total AHI of 10.7/hour, REM AHI of 53.8/hour, supine AHI of 30/hour and O2 nadir of 81%. The patient endorsed the Epworth Sleepiness Scale at 19 points, BMI of 38.3 kg/m2. The patient's neck circumference measured 15.5 inches.  CURRENT MEDICATIONS: Proventil, Augmentin, Valium, Flonase, Antivert, Phnylephrine, Zantac, Ocean (nasal spray)   PROCEDURE:  This is a multichannel digital polysomnogram utilizing the SomnoStar 11.2 system.  Electrodes and sensors were applied and monitored per AASM Specifications.   EEG, EOG, Chin and Limb EMG, were sampled at 200 Hz.  ECG, Snore and Nasal Pressure, Thermal Airflow, Respiratory Effort, CPAP Flow and Pressure, Oximetry was sampled at 50 Hz. Digital video and audio were recorded.      The patient was fitted with a small Eson 2 nasal mask. CPAP was initiated at 5 cmH20 with heated humidity per AASM standards and pressure was advanced to 10 cm H20 because of hypopneas, apneas and desaturations. At a PAP pressure of 10 cmH20, there was a reduction of the AHI to 0/hour with supine REM sleep achieved and O2 nadir of 95%.   Lights Out was at 22:13 and Lights On at 05:06. Total recording time (TRT) was 413.5 minutes, with a total sleep time (TST) of 360.5 minutes. The patient's sleep latency was 22.5 minutes. REM latency was 63 minutes, which is slightly reduced. The sleep efficiency was 87.2 %.    SLEEP ARCHITECTURE: WASO (Wake after sleep onset) was 30.5 minutes with mild sleep fragmentation noted. There were 16.5 minutes in Stage N1, 254 minutes Stage N2, 33 minutes Stage N3 and 57  minutes in Stage REM.  The percentage of Stage N1 was 4.6%, Stage N2 was 70.5%, which is increased, Stage N3 was 9.2% and Stage R (REM sleep) was 15.8%, which is mildly reduced. The arousals were noted as: 33 were spontaneous, 0 were associated with PLMs, 1 were associated with respiratory events.  RESPIRATORY ANALYSIS:  There was a total of 6 respiratory events: 3 obstructive apneas, 0 central apneas and 1 mixed apneas with a total of 4 apneas and an apnea index (AI) of .7 /hour. There were 2 hypopneas with a hypopnea index of .3/hour. The patient also had 0 respiratory event related arousals (RERAs).      The total APNEA/HYPOPNEA INDEX  (AHI) was 1. /hour and the total RESPIRATORY DISTURBANCE INDEX was 1. /hour  0 events occurred in REM sleep and 6 events in NREM. The REM AHI was 0 /hour versus a non-REM AHI of 1.2 /hour.  The patient spent 257 minutes of total sleep time in the supine position and 104 minutes in non-supine. The supine AHI was 1.4, versus a non-supine AHI of 0.0.  OXYGEN SATURATION & C02:  The baseline 02 saturation was 98%, with the lowest being 94%. Time spent below 89% saturation equaled 0 minutes.  PERIODIC LIMB MOVEMENTS:  The patient had a total of 0 Periodic Limb Movements. The Periodic Limb Movement (PLM) index was 0 and the PLM Arousal index was 0 /hour.  Audio and video analysis did not show any  abnormal or unusual movements, behaviors, phonations or vocalizations, except for brief sleep talking noted. The patient took no bathroom breaks. The EKG was in keeping with normal sinus rhythm (NSR).  Post-study, the patient indicated that sleep was better than usual.   IMPRESSION:   1. Obstructive Sleep Apnea(OSA) 2. Dysfunctions associated with sleep stages or arousal from sleep   RECOMMENDATIONS:   1. This study demonstrates resolution of the patient's obstructive sleep apnea with CPAP therapy. I will, therefore, start the patient on home CPAP treatment at a pressure of  10 cm via small Eson 2 nasal mask with heated humidity. The patient should be reminded to be fully compliant with PAP therapy to improve sleep related symptoms and decrease long term cardiovascular risks. The patient should be reminded, that it may take up to 3 months to get fully used to using PAP with all planned sleep. The earlier full compliance is achieved, the better long term compliance tends to be. Please note that untreated obstructive sleep apnea may carry additional perioperative morbidity. Patients with significant obstructive sleep apnea should receive perioperative PAP therapy and the surgeons and particularly the anesthesiologist should be informed of the diagnosis and the severity of the sleep disordered breathing. 2. This study shows sleep fragmentation and mildly abnormal sleep stage percentages; these are nonspecific findings and per se do not signify an intrinsic sleep disorder or a cause for the patient's sleep-related symptoms. Causes include (but are not limited to) the first night effect of the sleep study, circadian rhythm disturbances, medication effect or an underlying mood disorder or medical problem.  3. The patient should be cautioned not to drive, work at heights, or operate dangerous or heavy equipment when tired or sleepy. Review and reiteration of good sleep hygiene measures should be pursued with any patient. 4. The patient will be [facility] for discussion of the test results, symptom and treatment compliance review, further management strategies, etc. The referring provider will be notified of the test results.   I certify that I have reviewed the entire raw data recording prior to the issuance of this report in accordance with the Standards of Accreditation of the American Academy of Sleep Medicine (AASM)     Huston FoleySaima Chaselyn Nanney, MD, PhD Diplomat, American Board of Neurology and Sleep Medicine (Neurology and Sleep Medicine)

## 2018-08-12 NOTE — Progress Notes (Signed)
Patient referred by PCP NP, seen by me on 06/19/18, diagnostic PSG on 07/05/18. Patient had a CPAP titration study on 08/09/18.  Please call and inform patient that I have entered an order for treatment with positive airway pressure (PAP) treatment for obstructive sleep apnea (OSA). She did well during the latest sleep study with CPAP. We will, therefore, arrange for a machine for home use through a DME (durable medical equipment) company of Her choice; and I will see the patient back in follow-up in about 10 weeks. Please also explain to the patient that I will be looking out for compliance data, which can be downloaded from the machine (stored on an SD card, that is inserted in the machine) or via remote access through a modem, that is built into the machine. At the time of the followup appointment we will discuss sleep study results and how it is going with PAP treatment at home. Please advise patient to bring Her machine at the time of the first FU visit, even though this is cumbersome. Bringing the machine for every visit after that will likely not be needed, but often helps for the first visit to troubleshoot if needed. Please re-enforce the importance of compliance with treatment and the need for us to monitor compliance data - often an insurance requirement and actually good feedback for the patient as far as how they are doing.  Also remind patient, that any interim PAP machine or mask issues should be first addressed with the DME company, as they can often help better with technical and mask fit issues. Please ask if patient has a preference regarding DME company.  Please also make sure, the patient has a follow-up appointment with me in about 10 weeks from the setup date, thanks. May see one of our nurse practitioners if needed for proper timing of the FU appointment.  Please fax or rout report to the referring provider. Thanks,   Huston FoleySaima Tyja Gortney, MD, PhD [facility])

## 2018-08-13 ENCOUNTER — Telehealth: Payer: Self-pay

## 2018-08-13 NOTE — Telephone Encounter (Signed)
-----   Message from Huston FoleySaima Athar, MD sent at 08/12/2018  8:29 AM EDT ----- Patient referred by PCP NP, seen by me on 06/19/18, diagnostic PSG on 07/05/18. Patient had a CPAP titration study on 08/09/18.  Please call and inform patient that I have entered an order for treatment with positive airway pressure (PAP) treatment for obstructive sleep apnea (OSA). She did well during the latest sleep study with CPAP. We will, therefore, arrange for a machine for home use through a DME (durable medical equipment) company of Her choice; and I will see the patient back in follow-up in about 10 weeks. Please also explain to the patient that I will be looking out for compliance data, which can be downloaded from the machine (stored on an SD card, that is inserted in the machine) or via remote access through a modem, that is built into the machine. At the time of the followup appointment we will discuss sleep study results and how it is going with PAP treatment at home. Please advise patient to bring Her machine at the time of the first FU visit, even though this is cumbersome. Bringing the machine for every visit after that will likely not be needed, but often helps for the first visit to troubleshoot if needed. Please re-enforce the importance of compliance with treatment and the need for us to monitor compliance data - often an insurance requirement and actually good feedback for the patient as far as how they are doing.  Also remind patient, that any interim PAP machine or mask issues should be first addressed with the DME company, as they can often help better with technical and mask fit issues. Please ask if patient has a preference regarding DME company.  Please also make sure, the patient has a follow-up appointment with me in about 10 weeks from the setup date, thanks. May see one of our nurse practitioners if needed for proper timing of the FU appointment.  Please fax or rout report to the referring provider. Thanks,    Huston FoleySaima Athar, MD, PhD [facility])

## 2018-08-13 NOTE — Telephone Encounter (Signed)
I called pt to discuss her sleep study results. No answer, VM is full. Will try again later. 

## 2018-08-13 NOTE — Telephone Encounter (Signed)
I called Stephanie Chandler. I advised Stephanie Chandler that Dr. Frances FurbishAthar reviewed their sleep study results and found that Stephanie Chandler did well with the cpap during his latest sleep study. Dr. Frances FurbishAthar recommends that Stephanie Chandler start a cpap during her latest sleep study. I reviewed PAP compliance expectations with the Stephanie Chandler. Stephanie Chandler is agreeable to starting a CPAP. I advised Stephanie Chandler that an order will be sent to a DME, [employer], and [employer] will call the Stephanie Chandler within about one week after they file with the Stephanie Chandler's insurance. [employer] will show the Stephanie Chandler how to use the machine, fit for masks, and troubleshoot the CPAP if needed. A follow up appt was made for insurance purposes with Dr. Frances FurbishAthar on 11/13/2018. Stephanie Chandler verbalized understanding to arrive 15 minutes early and bring their CPAP. A letter with all of this information in it will be mailed to the Stephanie Chandler as a reminder. I verified with the Stephanie Chandler that the address we have on file is correct. Stephanie Chandler verbalized understanding of results. Stephanie Chandler had no questions at this time but was encouraged to call back if questions arise.

## 2018-09-25 ENCOUNTER — Encounter: Payer: Self-pay | Admitting: Neurology

## 2018-10-04 ENCOUNTER — Ambulatory Visit: Payer: Self-pay | Admitting: *Deleted

## 2018-10-04 DIAGNOSIS — M25561 Pain in right knee: Secondary | ICD-10-CM

## 2018-10-04 NOTE — Patient Instructions (Addendum)
Few different possibilities of what could be causing your pain. May be an arthritis flare related to the recent cooler weather. Possible popliteus tendinopathy which is inflammation of the tendon that connects from the femur to the popliteus muscle and runs around at a down angle at the back of the knee. It manages the stability of the rotation of your knee. 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 I've included the stretches she included last year, as well as the rehab stretches for the popliteus tendonitis. Start with 3 repetitions each exercise daily, every week adding another repetition working up to 10 reps/threes times a day over one months' time.  Additionally, you can use Biofreeze apply up to 4x daily as needed for pain. Don't use this right after ice/heat therapy until full sensation has returned to avoid skin injuries. Ice or heat therapy, whichever feels best, for 20 minutes three times a day. Continue knee sleeve for support. Ibuprofen 800mg  every 8 hours for inflammation and pain. Follow up in NP clinic in one week if pain is not improving or is worsening.  Popliteus Tendinitis Rehab Ask your health care provider which exercises are safe for you. Do exercises exactly as told by your health care provider and adjust them as directed. It is normal to feel mild stretching, pulling, tightness, or discomfort as you do these exercises, but you should stop right away if you feel sudden pain or your pain gets worse.Do not begin these exercises until told by your health care provider. Stretching and range of motion exercises These exercises warm up your muscles and joints and improve the movement and flexibility of your knee. These exercises also help to relieve pain, numbness, and tingling. Exercise A: Gastroc, standing  1. Stand with your hands against a wall. 2. Extend  your left / right leg behind you, and bend your front knee slightly. Your heels should be on the floor. 3. Point the toes on your back foot slightly inward. 4. Keeping your heels on the floor and your back knee straight, shift your weight toward the wall. You should feel a gentle stretch in the back of your lower leg (calf). 5. Hold this position for __________ seconds. Repeat __________ times. Complete this stretch __________ times a day. Exercise B: Soleus, standing 1. Stand with your hands against a wall. 2. Extend left / right leg behind you, and bend your front knee slightly. Your heels should be on the floor. 3. Point the toes on your back foot slightly inward. 4. Keeping your heels on the floor, bend your back knee and shift your weight slightly over your back leg. You should feel a gentle stretch deep in the back of your lower leg (calf). 5. Hold this position for __________ seconds. Repeat __________ times. Complete this stretch __________ times a day. Exercise C: Gastrocsoleus, standing 1. Stand on the edge of a step on the balls of your feet, and hold onto the rail for balance. 2. Slowly lift your healthy foot, allowing your body weight to press your left / right heel down over the edge of the step. You should feel a stretch in your left / right calf. 3. Hold this position for __________ seconds. Repeat __________ times with your left / right knee straight and __________ times with your left / right knee bent. Complete this stretch __________ times a day.  Exercise D: Hamstring, standing  1. Stand with your left / right leg fully extended and your foot resting on a chair. 2. Arch your lower back slightly. 3. Lean forward at the waist, leading with your chest, until you feel a gentle stretch in the back of your left / right knee or thigh. You should not need to lean far to feel the stretch. 4. Hold this position for __________ seconds. Repeat __________ times. Complete this stretch  __________ times a day. Exercise E: Hamstrings, supine  1. Lie on your back. 2. Loop a belt or towel over the ball of your left / right foot. The ball of your foot is on the walking surface, right under your toes. 3. Straighten your left / right knee and slowly pull on the belt to raise your leg until you feel a gentle stretch behind your knee. Do not allow your knees to bend. Keep your other leg flat on the floor. 4. Hold this position for __________ seconds. Repeat __________ times. Complete this stretch __________ times a day. Exercise F: Hamstrings, doorway  1. Lie on your back in front of a doorway with your left / right leg resting against the wall and your other leg flat on the floor in the doorway. There should be a slight bend in your left / right knee. 2. Straighten your left / right knee. You should feel a stretch behind your knee or thigh. If you do not feel that stretch, scoot your buttocks closer to the door. 3. Hold this position for __________ seconds. Repeat __________ times. Complete this stretch __________ times a day. Exercise G: Quadriceps, prone  1. Lie on your abdomen on a firm surface, such as a bed or padded floor. 2. Bend your left / right knee and hold your ankle. If you cannot reach your ankle or pant leg, loop a belt around your foot and hold the belt instead. 3. Gently pull your heel toward your buttocks. Your knee should not slide out to the side. You should feel a stretch in the front of your thigh and knee. 4. Hold this position for __________ seconds. Repeat __________ times. Complete this stretch __________ times a day. Strengthening exercises These exercises build strength and endurance in your knee. Endurance is the ability to use your muscles for a long time, even after they get tired. Exercise H: Hamstring, isometric  1. Lie on your back on a firm surface. 2. Bend your left / right knee about __________ degrees. 3. Dig your left / right heel into the  surface as if you are trying to pull your heel toward your buttocks. Tighten the muscles in the back of your thighs to "dig" as hard as you can without increasing any pain. 4. Hold this position for __________ seconds. 5. Release the tension gradually. 6. Allow your muscle to completely relax for __________ seconds. Repeat __________ times. Complete this exercise __________ times a day. Exercise I: Hamstring curls  1. Lie on your abdomen with your legs straight. 2. Tighten the muscles in the back of your thigh to bend your left / right knee to an "L" shape (up to 90 degrees). Keep your hips flat against the surface that you are lying on. 3. Hold this position for __________ seconds. 4. Slowly lower your leg to the starting position. Repeat __________ times. Complete this exercise __________ times a day. If told by your health care provider, do this exercise while wearing ankle weights. Begin with __________ weights. Then increase  the weight by 1 lb (0.5 kg) increments. Do not wear ankle weights that are heavier than __________. This information is not intended to replace advice given to you by your health care provider. Make sure you discuss any questions you have with your health care provider. Document Released: 11/13/2005 Document Revised: 07/18/2016 Document Reviewed: 09/07/2015 Elsevier Interactive Patient Education  2018 [employer]  Iliotibial Band Syndrome Rehab Ask your health care provider which exercises are safe for you. Do exercises exactly as told by your health care provider and adjust them as directed. It is normal to feel mild stretching, pulling, tightness, or discomfort as you do these exercises, but you should stop right away if you feel sudden pain or your pain gets worse.Do not begin these exercises until told by your health care provider. Stretching and range of motion exercises These exercises warm up your muscles and joints and improve the movement and flexibility of  your hip and pelvis. Exercise A: Quadriceps, prone  6. Lie on your abdomen on a firm surface, such as a bed or padded floor. 7. Bend your left / right knee and hold your ankle. If you cannot reach your ankle or pant leg, loop a belt around your foot and grab the belt instead. 8. Gently pull your heel toward your buttocks. Your knee should not slide out to the side. You should feel a stretch in the front of your thigh and knee. 9. Hold this position for __________ seconds. Repeat __________ times. Complete this stretch __________ times a day. Exercise B: Iliotibial band  6. Lie on your side with your left / right leg in the top position. 7. Bend both of your knees and grab your left / right ankle. Stretch out your bottom arm to help you balance. 8. Slowly bring your top knee back so your thigh goes behind your trunk. 9. Slowly lower your top leg toward the floor until you feel a gentle stretch on the outside of your left / right hip and thigh. If you do not feel a stretch and your knee will not fall farther, place the heel of your other foot on top of your knee and pull your knee down toward the floor with your foot. 10. Hold this position for __________ seconds. Repeat __________ times. Complete this stretch __________ times a day. Strengthening exercises These exercises build strength and endurance in your hip and pelvis. Endurance is the ability to use your muscles for a long time, even after they get tired. Exercise C: Straight leg raises ( hip abductors) 4. Lie on your side with your left / right leg in the top position. Lie so your head, shoulder, knee, and hip line up. You may bend your bottom knee to help you balance. 5. Roll your hips slightly forward so your hips are stacked directly over each other and your left / right knee is facing forward. 6. Tense the muscles in your outer thigh and lift your top leg 4-6 inches (10-15 cm). 7. Hold this position for __________ seconds. 8. Slowly  return to the starting position. Let your muscles relax completely before doing another repetition. Repeat __________ times. Complete this exercise __________ times a day. Exercise D: Straight leg raises ( hip extensors) 5. Lie on your abdomen on your bed or a firm surface. You can put a pillow under your hips if that is more comfortable. 6. Bend your left / right knee so your foot is straight up in the air. 7. Squeeze your buttock  muscles and lift your left / right thigh off the bed. Do not let your back arch. 8. Tense this muscle as hard as you can without increasing any knee pain. 9. Hold this position for __________ seconds. 10. Slowly lower your leg to the starting position and allow it to relax completely. Repeat __________ times. Complete this exercise __________ times a day. Exercise E: Hip hike 5. Stand sideways on a bottom step. Stand on your left / right leg with your other foot unsupported next to the step. You can hold onto the railing or wall if needed for balance. 6. Keep your knees straight and your torso square. Then, lift your left / right hip up toward the ceiling. 7. Slowly let your left / right hip lower toward the floor, past the starting position. Your foot should get closer to the floor. Do not lean or bend your knees. Repeat __________ times. Complete this exercise __________ times a day. This information is not intended to replace advice given to you by your health care provider. Make sure you discuss any questions you have with your health care provider. Document Released: 11/13/2005 Document Revised: 07/18/2016 Document Reviewed: 10/15/2015 Elsevier Interactive Patient Education  [employer].

## 2018-10-04 NOTE — Progress Notes (Signed)
Pt c/o R knee pain x3-4 days. No known acute injury. Woke up with it and has progressively worsened. Posterolateral pain, worse with weight bearing and ambulation. She describes as "feels like it's catching and I can't stretch it enough in the back." Non-weightbearing, she has no pain with knee fully extended and to 90 degrees, but pain begins once knee is bent approx 20-30 degrees posteriorly. Denies weakness/giving out, or locking of knee. Reports hearing an audible pop at times with some extensions. No crepitus noted on exam. No cyst palpated. Popliteal pulse palpated without difficulty. Pt wearing fitted, skinny jeans and unable to fully visualize knee for exam. Pt denies swelling, bruising, erythema, warmth.   Also c/o similar sensation/catch/pain over femoropatellar area. Began at same time and has progressed in a similar pattern. Pt has not tried any interventions at home aside from wearing size large neoprene sleeve that was given from clinic stock when seen Dec 2018 for R knee pain. 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 At that time pain was located over R lateral IT band of thigh which is not present currently, and at posterior fossa.  Discussed possible arthritis flare r/t recent cooler weather. Possible quadriceps tendonitis 2/2 unknown injury of twist or turn, however no knee extension resistance on exam; pain decreased with extension. Possible popliteus tendinopathy. Or recurrence of IT band dysfunction. Discussed and provided handout via email of stretches that can be performed for IT band pain and popliteus tendinopathy. Per NP previous stretching/rehab exercise instructions, start with 3 repetitions each exercise daily, every week adding another repetition working up to 10 reps/threes times a day over one months' time. Given Biofreeze UD packets to apply up to 4x daily prn pain. Reviewed ice/heat therapy 20 min 3x/day, whichever feels best. She will trial and see as she is  unsure which would. Given Thermacare muscle patch to apply for heat therapy. Continue knee sleeve for support. Ibuprofen 800mg  po TID from clinic stock for inflammation and pain. Follow up in NP clinic in one week if pain is not improving or is worsening. Pt verbalizes understanding and agreement. Has no further questions/concerns at this time.

## 2018-11-07 ENCOUNTER — Ambulatory Visit: Payer: Self-pay | Admitting: Registered Nurse

## 2018-11-07 ENCOUNTER — Encounter: Payer: Self-pay | Admitting: Registered Nurse

## 2018-11-07 ENCOUNTER — Other Ambulatory Visit: Payer: Self-pay | Admitting: Registered Nurse

## 2018-11-07 VITALS — BP 105/87 | HR 77 | Temp 97.7°F

## 2018-11-07 DIAGNOSIS — F17210 Nicotine dependence, cigarettes, uncomplicated: Secondary | ICD-10-CM

## 2018-11-07 DIAGNOSIS — M778 Other enthesopathies, not elsewhere classified: Secondary | ICD-10-CM

## 2018-11-07 DIAGNOSIS — M7522 Bicipital tendinitis, left shoulder: Secondary | ICD-10-CM

## 2018-11-07 MED ORDER — NICOTINE 14 MG/24HR TD PT24: 14.0000 mg | MEDICATED_PATCH | Freq: Every day | TRANSDERMAL | 0 refills | Status: DC

## 2018-11-07 MED ORDER — IBUPROFEN 800 MG PO TABS: 800.0000 mg | ORAL_TABLET | Freq: Three times a day (TID) | ORAL | 0 refills | Status: AC

## 2018-11-07 MED ORDER — CYCLOBENZAPRINE HCL 10 MG PO TABS: 5.0000 mg | ORAL_TABLET | Freq: Three times a day (TID) | ORAL | 0 refills | Status: DC | PRN

## 2018-11-07 MED ORDER — MENTHOL (TOPICAL ANALGESIC) 4 % EX GEL: 1.0000 "application " | Freq: Four times a day (QID) | CUTANEOUS | 0 refills | Status: DC | PRN

## 2018-11-07 MED ORDER — ACETAMINOPHEN 500 MG PO TABS: 1000.0000 mg | ORAL_TABLET | Freq: Four times a day (QID) | ORAL | 0 refills | Status: AC | PRN

## 2018-11-07 MED ORDER — NICOTINE POLACRILEX 4 MG MT GUM: CHEWING_GUM | OROMUCOSAL | 0 refills | Status: DC

## 2018-11-07 NOTE — Telephone Encounter (Signed)
160 pieces modification is approved.

## 2018-11-07 NOTE — Progress Notes (Signed)
Subjective:    Patient ID: Stephanie Chandler, female    DOB: Jan 07, 1966, 52 y.o.   MRN: [id number]  52y/o African-American established female pt c/o L arm pain x1 week. Reports pain feels like it begins in L bicep and extends down anterior forearm, into wrist and hand. Fingers will become numb and tingling. She shakes hand around to relieve this some. Notes that sx worsen with lifting and increased arm use.  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. This requires more arm and shoulder use, lifting, and repetitive motion. Using Ibuprofen at home prn for pain.   Mother has been ill taking care of herself and not so much herself.  Right hand dominant.  Chronic right shoulder pain.  Patient also wants Rx so she can use flex spending account for tobacco cessation aids would like to try transdermal and gum.  Typically 6 cigarettes per day 1 pack lasts 2-3 days.  First cigarette within 30 minutes of waking up in am.     Review of Systems  Constitutional: Negative for activity change, appetite change, chills, diaphoresis, fatigue and fever.  HENT: Negative for trouble swallowing and voice change.   Eyes: Negative for photophobia and visual disturbance.  Respiratory: Negative for cough, shortness of breath, wheezing and stridor.   Cardiovascular: Negative for leg swelling.  Gastrointestinal: Negative for abdominal pain, diarrhea, nausea and vomiting.  Endocrine: Negative for cold intolerance and heat intolerance.  Genitourinary: Negative for difficulty urinating.  Musculoskeletal: Positive for arthralgias and myalgias. Negative for back pain, gait problem, joint swelling, neck pain and neck stiffness.  Skin: Negative for color change, pallor, rash and wound.  Allergic/Immunologic: Positive for environmental allergies. Negative for food allergies.  Neurological: Positive for weakness and numbness. Negative for dizziness,  tremors, seizures, syncope, facial asymmetry, speech difficulty, light-headedness and headaches.  Hematological: Negative for adenopathy. Does not bruise/bleed easily.  Psychiatric/Behavioral: Negative for agitation, confusion and sleep disturbance.       Objective:   Physical Exam Vitals signs and nursing note reviewed.  Constitutional:      General: She is not in acute distress.    Appearance: Normal appearance. She is well-developed. She is obese. She is not ill-appearing, toxic-appearing or diaphoretic.  HENT:     Head: Normocephalic and atraumatic.     Right Ear: Hearing and external ear normal.     Left Ear: Hearing and external ear normal.     Nose: Nose normal. No congestion or rhinorrhea.     Mouth/Throat:     Lips: Pink. No lesions.     Mouth: Mucous membranes are moist. No oral lesions.     Dentition: No gum lesions.     Pharynx: Oropharynx is clear. Uvula midline. No pharyngeal swelling, oropharyngeal exudate, posterior oropharyngeal erythema or uvula swelling.     Tonsils: No tonsillar exudate. Swelling: 0 on the right. 0 on the left.  Eyes:     General: Lids are normal.     Extraocular Movements: Extraocular movements intact.     Conjunctiva/sclera: Conjunctivae normal.     Pupils: Pupils are equal, round, and reactive to light.  Neck:     Musculoskeletal: Normal range of motion and neck supple. Normal range of motion. No edema, erythema, neck rigidity, crepitus, pain with movement, torticollis or muscular tenderness.     Trachea: Trachea and phonation normal.  Cardiovascular:     Rate and Rhythm: Normal rate and regular  rhythm.     Pulses: Normal pulses.          Radial pulses are 2+ on the right side and 2+ on the left side.     Heart sounds: Normal heart sounds. No murmur. No friction rub.  Pulmonary:     Effort: Pulmonary effort is normal. No respiratory distress.     Breath sounds: Normal breath sounds and air entry. No stridor or decreased air movement. No  decreased breath sounds, wheezing, rhonchi or rales.  Abdominal:     General: There is no distension.     Palpations: Abdomen is soft.     Tenderness: There is no guarding.  Musculoskeletal:        General: Tenderness present. No swelling, deformity or signs of injury.     Right shoulder: She exhibits decreased range of motion, tenderness, pain and spasm. She exhibits no swelling, no effusion, no crepitus, no deformity, no laceration and normal strength.     Left shoulder: Normal.     Right elbow: Normal.    Left elbow: Normal.     Right wrist: Normal.     Left wrist: She exhibits tenderness. She exhibits normal range of motion, no bony tenderness, no swelling, no effusion, no crepitus, no deformity and no laceration.     Right hip: Normal.     Left hip: Normal.     Right knee: Normal.     Left knee: Normal.     Cervical back: She exhibits tenderness, pain and spasm. She exhibits normal range of motion, no bony tenderness, no swelling, no edema, no deformity and no laceration.     Thoracic back: Normal.     Lumbar back: Normal.     Right upper arm: Normal.     Left upper arm: She exhibits tenderness. She exhibits no bony tenderness, no swelling, no edema, no deformity and no laceration.     Right forearm: Normal.     Left forearm: She exhibits tenderness. She exhibits no bony tenderness, no swelling, no edema, no deformity and no laceration.     Right hand: Normal.     Left hand: Normal.     Right lower leg: No edema.     Left lower leg: No edema.     Comments: Full AROM c/t/l-spine; proximal and distal biceps tendon TTP; bilateral trapezius tight and some TTP cervical paraspinals; distal left forearm/wrist TTP soft tissue no crepitus/edema/erythema/increased temperature  Lymphadenopathy:     Head:     Right side of head: No submental, submandibular, tonsillar, preauricular, posterior auricular or occipital adenopathy.     Left side of head: No submental, submandibular, tonsillar,  preauricular, posterior auricular or occipital adenopathy.     Cervical: No cervical adenopathy.     Right cervical: No superficial, deep or posterior cervical adenopathy.    Left cervical: No superficial, deep or posterior cervical adenopathy.  Skin:    General: Skin is warm and dry.     Capillary Refill: Capillary refill takes less than 2 seconds.     Coloration: Skin is not jaundiced or pale.     Findings: No abrasion, bruising, burn, ecchymosis, erythema, signs of injury, laceration, lesion, petechiae, rash or wound.     Nails: There is no clubbing.   Neurological:     General: No focal deficit present.     Mental Status: She is alert and oriented to person, place, and time. Mental status is at baseline.     GCS: GCS eye subscore is  4. GCS verbal subscore is 5. GCS motor subscore is 6.     Cranial Nerves: Cranial nerves are intact. No cranial nerve deficit.     Sensory: Sensation is intact. No sensory deficit.     Motor: Motor function is intact. No weakness, tremor, atrophy, abnormal muscle tone or seizure activity.     Coordination: Coordination is intact. Coordination normal.     Gait: Gait normal.     Comments: Bilateral extremity strength equal 5/5 upper and lower; on/off exam table without difficulty; gait sure and steady in hallway; bilateral hand grasp equal 5/5;   Psychiatric:        Attention and Perception: Attention and perception normal.        Mood and Affect: Mood and affect normal.        Speech: Speech normal.        Behavior: Behavior normal. Behavior is cooperative.        Thought Content: Thought content normal.        Cognition and Memory: Cognition and memory normal.        Judgment: Judgment normal.           Assessment & Plan:  A-biceps tendonitis acute left, left wrist strain  P-NSAIDS x 2 weeks refilled motrin 800mg  po TID prn pain #30 RF0 take with food dispensed from [facility] to patient.  [facility] handout on proximal and distal biceps tendonitis with  rehab exercises.  Wrist strain and rehab exercises printed and given to patient.  Discussed will take weeks to completely resolve but should be improving with plan of care.  Has left  wrist splint at home may need to restart wear for support at work due to repetitive lifting. Wear at night when sleeping.  Biofreeze topical gel apply QID prn pain given 4 UD from clinic stock. Follow up in 2 weeks for reevaluation sooner if worsening symptoms discussed probably 6 weeks for condition to completely flare down can take longer if not getting stretches/rest/nsaids and ice.  Follow up sooner if worsening tingling/numbness/pain or new swelling despite plan of care. [facility] handout printed and given on wrist strain with rehab exercises  Patient verbalized understanding information/instructions, agreed with plan of care and had no further questions at this time.  cyclobenazeprine/flexeril 5-10mg  po TID prn muscle spasms #30 RF0 dispensed from [facility].  Ibuprofen 800mg  po TID prn pain #30 RF0 dispensed from [facility].  Avoid alcohol intake and driving while taking cyclobenazeprine/flexeril as drowsiness common side effect.  Slow position changes as medication also lower blood pressure.  Home stretches demonstrated to patient-e.g. Arm circles, walking up wall, chest stretches, neck AROM, chin tucks, knee to chest and rock side to side on back. Self massage or professional prn, foam roller use or tennis/racquetball.  Heat/cryotherapy 15 minutes QID prn.  Consider thermacare.  Consider physical therapy referral if no improvement with prescribed therapy from [facility] and/or chiropractic care.  Ensure ergonomics correct desk at work avoid repetitive motions if possible/holding phone/laptop in hand use desk/stand and/or break up lifting items into smaller loads/weights.  Ensure items on tables are close to her body not reaching for prolonged periods across work table.  Patient was instructed to rest, ice, and ROM exercises.  Activity as  tolerated.   Follow up if symptoms persist or worsen especially if loss of bowel/bladder control, arm/leg weakness and/or saddle paresthesias.  [facility] handout printed on biceps tendonitis proximal and distal along with rehab exercises given to patient.  Patient verbalized agreement and understanding of  treatment plan and had no further questions at this time.  P2:  Injury Prevention and Fitness.  Coping with Tobacco cessation handout from [facility] will trial nicotine gum 4mg  chew 1 every 2 hours prn breakthrough cravings max 9 pieces per day (24 hours).  Patient would like to start with 50 pieces of gum  Per epocrates transdermal nicotine 14mg  apply 1 patch daily x 6 weeks remove old patch when applying new patch when cigarette use 6-10 per day and first cigarette within 30 minutes of waking up.  Patient to follow up in 1 month sooner if side effects from nicotine preparations.  Recommended stop smoking when applying first patch and use gum prn.  Exercise.  Healthy diet choices.  Patient verbalized understanding information/instructions, agreed with plan of care and had no further questions at this time.  [facility] pack Rx amendedquantity to #160 RF0

## 2018-11-07 NOTE — Patient Instructions (Addendum)
Coping with Quitting Smoking Quitting smoking is a physical and mental challenge. You will face cravings, withdrawal symptoms, and temptation. Before quitting, work with your health care provider to make a plan that can help you cope. Preparation can help you quit and keep you from giving in. How can I cope with cravings? Cravings usually last for 5-10 minutes. If you get through it, the craving will pass. Consider taking the following actions to help you cope with cravings:  Keep your mouth busy: ? Chew sugar-free gum. ? Suck on hard candies or a straw. ? Brush your teeth.  Keep your hands and body busy: ? Immediately change to a different activity when you feel a craving. ? Squeeze or play with a ball. ? Do an activity or a hobby, like making bead jewelry, practicing needlepoint, or working with wood. ? Mix up your normal routine. ? Take a short exercise break. Go for a quick walk or run up and down stairs. ? Spend time in public places where smoking is not allowed.  Focus on doing something kind or helpful for someone else.  Call a friend or family member to talk during a craving.  Join a support group.  Call a quit line, such as 1-800-QUIT-NOW.  Talk with your health care provider about medicines that might help you cope with cravings and make quitting easier for you.  How can I deal with withdrawal symptoms? Your body may experience negative effects as it tries to get used to not having nicotine in the system. These effects are called withdrawal symptoms. They may include:  Feeling hungrier than normal.  Trouble concentrating.  Irritability.  Trouble sleeping.  Feeling depressed.  Restlessness and agitation.  Craving a cigarette.  To manage withdrawal symptoms:  Avoid places, people, and activities that trigger your cravings.  Remember why you want to quit.  Get plenty of sleep.  Avoid coffee and other caffeinated drinks. These may worsen some of your  symptoms.  How can I handle social situations? Social situations can be difficult when you are quitting smoking, especially in the first few weeks. To manage this, you can:  Avoid parties, bars, and other social situations where people might be smoking.  Avoid alcohol.  Leave right away if you have the urge to smoke.  Explain to your family and friends that you are quitting smoking. Ask for understanding and support.  Plan activities with friends or family where smoking is not an option.  What are some ways I can cope with stress? Wanting to smoke may cause stress, and stress can make you want to smoke. Find ways to manage your stress. Relaxation techniques can help. For example:  Breathe slowly and deeply, in through your nose and out through your mouth.  Listen to soothing, relaxing music.  Talk with a family member or friend about your stress.  Light a candle.  Soak in a bath or take a shower.  Think about a peaceful place.  What are some ways I can prevent weight gain? Be aware that many people gain weight after they quit smoking. However, not everyone does. To keep from gaining weight, have a plan in place before you quit and stick to the plan after you quit. Your plan should include:  Having healthy snacks. When you have a craving, it may help to: ? Eat plain popcorn, crunchy carrots, celery, or other cut vegetables. ? Chew sugar-free gum.  Changing how you eat: ? Eat small portion sizes at meals. ?   Eat 4-6 small meals throughout the day instead of 1-2 large meals a day. ? Be mindful when you eat. Do not watch television or do other things that might distract you as you eat.  Exercising regularly: ? Make time to exercise each day. If you do not have time for a long workout, do short bouts of exercise for 5-10 minutes several times a day. ? Do some form of strengthening exercise, like weight lifting, and some form of aerobic exercise, like running or  swimming.  Drinking plenty of water or other low-calorie or no-calorie drinks. Drink 6-8 glasses of water daily, or as much as instructed by your health care provider.  Summary  Quitting smoking is a physical and mental challenge. You will face cravings, withdrawal symptoms, and temptation to smoke again. Preparation can help you as you go through these challenges.  You can cope with cravings by keeping your mouth busy (such as by chewing gum), keeping your body and hands busy, and making calls to family, friends, or a helpline for people who want to quit smoking.  You can cope with withdrawal symptoms by avoiding places where people smoke, avoiding drinks with caffeine, and getting plenty of rest.  Ask your health care provider about the different ways to prevent weight gain, avoid stress, and handle social situations. This information is not intended to replace advice given to you by your health care provider. Make sure you discuss any questions you have with your health care provider. Document Released: 11/10/2016 Document Revised: 11/10/2016 Document Reviewed: 11/10/2016 Elsevier Interactive Patient Education  2018 [employer] Biceps Tendon Tendinitis (Proximal) and Tenosynovitis Rehab Ask your health care provider which exercises are safe for you. Do exercises exactly as told by your health care provider and adjust them as directed. It is normal to feel mild stretching, pulling, tightness, or discomfort as you do these exercises, but you should stop right away if you feel sudden pain or your pain gets worse.Do not begin these exercises until told by your health care provider. Stretching and range of motion exercises These exercises warm up your muscles and joints and improve the movement and flexibility of your arm and shoulder. These exercises also help to relieve pain and stiffness. Exercise A: Shoulder flexion  1. Stand facing a wall. Put your left / right hand on the  wall. 2. Slide your left / right hand up the wall. Stop when you feel a stretch in your shoulder, or when you reach the angle that is recommended by your health care provider. ? Use your other hand to help raise your arm, if needed. ? As your hand gets higher, you may need to step closer to the wall. ? Avoid shrugging your shoulder while you raise your arm. To do this, keep your shoulder blade tucked down toward your spine. 3. Hold for _____10_____ seconds. 4. Slowly return to the starting position. Use your other arm to help, if needed. Repeat __3________ times. Complete this exercise ___2_______ times a day. Exercise B: Posterior capsule stretch ( passive horizontal adduction) 1. Sit or stand and pull your left / right elbow across your chest, toward your other shoulder. Stop when you feel a gentle stretch in the back of your shoulder and upper arm. ? Keep your arm at shoulder height. ? Keep your arm as close to your body as you comfortably can. 2. Hold for _____10_____ seconds. 3. Slowly return to the starting position. Repeat 3 times. Complete this exercise ____2______ times a day.  Strengthening exercises These exercises build strength and endurance in your arm and shoulder. Endurance is the ability to use your muscles for a long time, even after your muscles get tired. Exercise C: Elbow flexion, supinated  1. Sit on a stable chair without armrests, or stand. 2. If directed, hold a ___no_______ weight in your left / right hand, or hold an exercise band with both hands. Your palms should face up toward the ceiling at the starting position. 3. Bend your left / right elbow and move your hand up toward your shoulder. Keep your other arm straight down, in the starting position. 4. Slowly return to the starting position. Repeat ___3_______ times. Complete this exercise ____2______ times a day. Exercise D: Scapular protraction, supine  1. Lie on your back on a firm surface. If directed, hold  a ____no______ weight in your left / right hand. 2. Raise your left / right arm straight into the air so your hand is directly above your shoulder joint. 3. Push the weight into the air so your shoulder lifts off of the surface that you are lying on. Do not move your head, neck, or back. 4. Hold for ______15____ seconds. 5. Slowly return to the starting position. Let your muscles relax completely before you repeat this exercise. Repeat ______3____ times. Complete this exercise _____2_____ times a day. Exercise E: Scapular retraction  1. Sit in a stable chair without armrests, or stand. 2. Secure an exercise band to a stable object in front of you so the band is at shoulder height. 3. Hold one end of the exercise band in each hand. 4. Squeeze your shoulder blades together and move your elbows slightly behind you. Do not shrug your shoulders. 5. Hold for ____10______ seconds. 6. Slowly return to the starting position. Repeat ___3_______ times. Complete this exercise _____2_____ times a day. This information is not intended to replace advice given to you by your health care provider. Make sure you discuss any questions you have with your health care provider. Document Released: 11/13/2005 Document Revised: 07/20/2016 Document Reviewed: 10/22/2015 Elsevier Interactive Patient Education  2018 [employer] Biceps Tendon Tendinitis (Proximal) and Tenosynovitis The proximal biceps tendon is a strong cord of tissue that connects the biceps muscle, on the front of the upper arm, to the shoulder blade. Tendinitis is inflammation of a tendon. Tenosynovitis is inflammation of the lining around the tendon (tendon sheath). These conditions often occur at the same time, and they can interfere with the ability to bend the elbow and turn the hand palm-up (supination). Proximal biceps tendon tendinitis and tenosynovitis are usually caused by overusing the shoulder joint and the biceps muscle. These conditions  usually heal within 6 weeks. Proximal biceps tendon tendinitis may include a grade 1 or grade 2 strain of the tendon. A grade 1 strain is mild, and it involves a slight pull of the tendon without any stretching or noticeable tearing of the tendon. There is usually no loss of biceps muscle strength. A grade 2 strain is moderate, and it involves a small tear in the tendon. The tendon is stretched, and biceps strength is usually decreased. What are the causes? This condition may be caused by:  A sudden increase in frequency or intensity of activity that involves the shoulder and the biceps muscle.  Overuse of the biceps muscle. This can happen when you do the same movements over and over, such as: ? Supination. ? Forceful straightening (hyperextension) of the elbow. ? Bending the elbow.  A direct,  forceful hit or injury (trauma) to the elbow. This is rare.  What increases the risk? The following factors may make you more likely to develop this condition:  Playing contact sports.  Playing sports that involve throwing and overhead movements, including racket sports, gymnastics, weight lifting, or bodybuilding.  Doing physical labor.  Having poor strength and flexibility of the arm and shoulder.  What are the signs or symptoms? Symptoms of this condition may include:  Pain and inflammation in the front of the shoulder. Pain may get worse with movement, especially when you use resistance, as in weight lifting.  A feeling of warmth in the front of the shoulder.  Limited range of motion of the shoulder and the elbow.  A crackling sound (crepitation) when you move or touch the shoulder or the upper arm.  In some cases, symptoms may return (recur) after treatment, and they may be long-lasting (chronic). How is this diagnosed? This condition is diagnosed based on your symptoms, your medical history, and a physical exam. You may have tests, including X-rays or MRIs. Your health care provider  may test your range of motion by asking you to do arm movements. How is this treated? This condition is treated by resting and icing the injured area, and by doing physical therapy exercises. Depending on the severity of your condition, treatment may also include:  Medicines to help relieve pain and inflammation.  Ultrasound therapy. This is the application of sound waves to the injured area.  Injecting medicines (corticosteroids) into your tendon sheath.  Injecting medicines that numb the area (local anesthetics).  Surgery to remove the damaged part of the tendon and reattach the undamaged part of the tendon to the arm bone (humerus). This is usually only done if you have symptoms that do not get better with other treatment methods.  Follow these instructions at home: Managing pain, stiffness, and swelling  If directed, put ice on the injured area: ? Put ice in a plastic bag. ? Place a towel between your skin and the bag. ? Leave the ice on for 20 minutes, 2-3 times a day.  Move your fingers often to avoid stiffness and to lessen swelling.  Raise (elevate) the injured area above the level of your heart while you are sitting or lying down.  If directed, apply heat to the affected area before you exercise. Use the heat source that your health care provider recommends, such as a moist heat pack or a heating pad. ? Place a towel between your skin and the heat source. ? Leave the heat on for 20-30 minutes. ? Remove the heat if your skin turns bright red. This is especially important if you are unable to feel pain, heat, or cold. You may have a greater risk of getting burned. Activity  Return to your normal activities as told by your health care provider. Ask your health care provider what activities are safe for you.  Do not lift anything that is heavier than 10 lb (4.5 kg) until your health care provider tells you that it is safe.  Avoid activities that cause pain or make your  condition worse.  Do exercises as told by your health care provider. General instructions  Take over-the-counter and prescription medicines only as told by your health care provider.  Do not drive or operate heavy machinery while taking prescription pain medicines.  Keep all follow-up visits as told by your health care provider. This is important. How is this prevented?  Warm up and  stretch before being active.  Cool down and stretch after being active.  Give your body time to rest between periods of activity.  Make sure any equipment that you use is fitted to you.  Be safe and responsible while being active to avoid falls.  Do at least 150 minutes of moderate-intensity aerobic exercise each week, such as brisk walking or water aerobics.  Maintain physical fitness, including: ? Strength. ? Flexibility. ? Cardiovascular fitness. ? Endurance. Contact a health care provider if:  You have symptoms that get worse or do not get better after 2 weeks of treatment.  You develop new symptoms. Get help right away if:  You develop severe pain. This information is not intended to replace advice given to you by your health care provider. Make sure you discuss any questions you have with your health care provider. Document Released: 11/13/2005 Document Revised: 07/20/2016 Document Reviewed: 10/22/2015 Elsevier Interactive Patient Education  2018 [employer] Wrist Sprain, Adult A wrist sprain is a stretch or tear in the strong, fibrous tissues (ligaments) that connect your wrist bones. There are three types of wrist sprains:  Grade 1. In this type of sprain, the ligament is stretched more than normal.  Grade 2. In this type of sprain, the ligament is partially torn. You may be able to move your wrist, but not very much.  Grade 3. In this type of sprain, the ligament or muscle is completely torn. You may find it difficult or extremely painful to move your wrist even a little.  What  are the causes? A wrist sprain can be caused by using the wrist too much during sports, exercise, or at work. It can also happen with a fall or during an accident. What increases the risk? This condition is more likely to occur in people:  With a previous wrist or arm injury.  With poor wrist strength and flexibility.  Who play contact sports, such as football or soccer.  Who play sports that may result in a fall, such as skateboarding, biking, skiing, or snowboarding.  Who do not exercise regularly.  Who use exercise equipment that does not fit well.  What are the signs or symptoms? Symptoms of this condition include:  Pain in the wrist, arm, or hand.  Swelling or bruised skin near the wrist, hand, or arm. The skin may look yellow or kind of blue.  Stiffness or trouble moving the hand.  Hearing a pop or feeling a tear at the time of the injury.  A warm feeling in the skin around the wrist.  How is this diagnosed? This condition is diagnosed with a physical exam. Sometimes an X-ray is taken to make sure a bone did not break. If your health care provider thinks that you tore a ligament, he or she may order an MRI of your wrist. How is this treated? This condition is treated by resting and applying ice to your wrist. Additional treatment may include:  Medicine for pain and inflammation.  A splint to keep your wrist still (immobilized).  Exercises to strengthen and stretch your wrist.  Surgery. This may be done if the ligament is completely torn.  Follow these instructions at home: If you have a splint:   Do not put pressure on any part of the splint until it is fully hardened. This may take several hours.  Wear the splint as told by your health care provider. Remove it only as told by your health care provider.  Loosen the splint if  your fingers tingle, become numb, or turn cold and blue.  If your splint is not waterproof: ? Do not let it get wet. ? Cover it with  a watertight covering when you take a bath or a shower.  Keep the splint clean. Managing pain, stiffness, and swelling   If directed, put ice on the injured area. ? If you have a removable splint, remove it as told by your health care provider. ? Put ice in a plastic bag. ? Place a towel between your skin and the bag or between the splint and the bag. ? Leave the ice on for 20 minutes, 2-3 times per day.  Move your fingers often to avoid stiffness and to lessen swelling.  Raise (elevate) the injured area above the level of your heart while you are sitting or lying down. Activity  Rest your wrist. Do not do things that cause pain.  Return to your normal activities as told by your health care provider. Ask your health care provider what activities are safe for you.  Do exercises as told by your health care provider. General instructions  Take over-the-counter and prescription medicines only as told by your health care provider.  Do not use any products that contain nicotine or tobacco, such as cigarettes and e-cigarettes. These can delay healing. If you need help quitting, ask your health care provider.  Ask your health care provider when it is safe to drive if you have a splint.  Keep all follow-up visits as told by your health care provider. This is important. Contact a health care provider if:  Your pain, bruising, or swelling gets worse.  Your skin becomes red, gets a rash, or has open sores.  Your pain does not get better or it gets worse. Get help right away if:  You have a new or sudden sharp pain in the hand, arm, or wrist.  You have tingling or numbness in your hand.  Your fingers turn white, very red, or cold and blue.  You cannot move your fingers. This information is not intended to replace advice given to you by your health care provider. Make sure you discuss any questions you have with your health care provider. Document Released: 07/17/2014 Document  Revised: 06/10/2016 Document Reviewed: 06/01/2016 Elsevier Interactive Patient Education  2018 [employer]. Wrist Sprain Rehab Ask your health care provider which exercises are safe for you. Do exercises exactly as told by your health care provider and adjust them as directed. It is normal to feel mild stretching, pulling, tightness, or discomfort as you do these exercises, but you should stop right away if you feel sudden pain or your pain gets worse.Do not begin these exercises until told by your health care provider. Stretching and range of motion exercises These exercises warm up your muscles and joints and improve the movement and flexibility of your wrist. These exercises also help to relieve pain, numbness, and tingling. Exercise A: Wrist flexion, active 1. Extend your left / right arm in front of you, and point your fingers downward. 2. If told by your health care provider, bend your left / right arm. 3. Try to bring your palm toward your forearm as far as you can without pain. You should feel a gentle stretch on the top of your forearm and wrist. 4. Hold this position for _____10_____ seconds. 5. Slowly return to the starting position. Repeat ____3______ times. Complete this exercise ___2_______ times a day. Exercise B: Wrist extension, active 1. Extend your left /  right arm in front of you and turn your palm upward. 2. If told by your health care provider, bend your left / right arm. 3. Bring your palm and fingertips back so your fingers point downward. You should feel a gentle stretch on the inside of your forearm and wrist. 4. Hold this position for ____10______ seconds. 5. Slowly return to the starting position. Repeat _____3_____ times. Complete this exercise ______2____ times a day. Exercise C: Supination, active 1. Stand or sit with your arms at your sides. 2. Bend your left / right elbow to an "L" shape (90 degrees). 3. Turn your palm upward until you feel a gentle stretch  in the inside of your forearm. 4. Hold this position for _____10_____ seconds. 5. Slowly return your palm to the starting position. Repeat ___3_______ times. Complete this stretch _______2___ times a day. Exercise D: Pronation, active 1. Stand or sit with your arms at your sides. 2. Bend your left / right elbow to an "L" shape (90 degrees). 3. Turn your palm downward until you feel a gentle stretch in the top of your forearm. 4. Hold this position for _____10_____ seconds. 5. Slowly return your palm to the starting position. Repeat __3________ times. Complete this stretch ____2______ times a day. Strengthening exercises These exercises build strength and endurance in your wrist. Endurance is the ability to use your muscles for a long time, even after they get tired. Exercise E: Wrist flexors 1. Sit with your left / right forearm supported on a table and your hand resting palm-up over the edge of the table. Your elbow should be below the level of your shoulder. 2. Hold a ___none_______ weightin your left / right hand. Or, hold a rubber exercise band or tube in both hands, keeping your hands at the same level and hip distance apart. There should be a slight tension in the exercise band or tube. 3. Slowly curl your hand up toward your forearm. 4. Hold this position for ______10____ seconds. 5. Slowly lower your hand back to the starting position. Repeat ______3____ times. Complete this exercise _____2_____ times a day. Exercise F: Wrist extensors 1. Sit with your left / right forearm supported on a table and your hand resting palm-down over the edge of the table. Your elbow should be below the level of your shoulder. 2. Hold a ____none______ weight in your left / right hand. Or, hold a rubber exercise band or tube in both hands, keeping your hands at the same level and hip distance apart. There should be a slight tension in the exercise band or tube. 3. Slowly curl your hand up toward your  forearm. 4. Hold this position for ___10_______ seconds. 5. Slowly lower your hand to the starting position. Repeat ___3_______ times. Complete this exercise ___2_______ times a day. This information is not intended to replace advice given to you by your health care provider. Make sure you discuss any questions you have with your health care provider. Document Released: 11/13/2005 Document Revised: 07/19/2016 Document Reviewed: 07/31/2015 Elsevier Interactive Patient Education  2018 [employer] Biceps Tendon Tendinitis (Distal) Rehab Ask your health care provider which exercises are safe for you. Do exercises exactly as told by your health care provider and adjust them as directed. It is normal to feel mild stretching, pulling, tightness, or discomfort as you do these exercises, but you should stop right away if you feel sudden pain or your pain gets worse.Do not begin these exercises until told by your health care provider. Stretching  and range of motion exercises These exercises warm up your muscles and joints and improve the movement and flexibility of your arm. These exercises can also help to relieve pain and stiffness. Exercise A: Supination, active-assisted 1. Stand or sit with your left / right elbow bent to an "L" shape (90 degrees). 2. Rotate your palm up until you cannot rotate it anymore. Then, use your other hand to help rotate your left / right forearm more. 3. Hold this position for ___10_______ seconds. 4. Slowly return to the starting position. Repeat ___3_______ times. Complete this exercise _____2_____ times a day. Exercise B: Pronation, active-assisted  1. Stand or sit with your left / right elbow bent to an "L" shape (90 degrees). 2. Rotate your left / right palm down until you cannot rotate it anymore. Then, use your other hand to help rotate your left / right forearm more. 3. Hold this position for ____10______ seconds. 4. Slowly return to the starting  position. Repeat ____3______ times. Complete this exercise 2 times a day. Strengthening exercises These exercises build strength and endurance in your arm and shoulder. Endurance is the ability to use your muscles for a long time, even after your muscles get tired. Exercise C: Supination  5. Sit with your left / right forearm supported on a table. Your elbow should be at waist height. 6. Rest your hand over the edge of the table, palm-down. 7. Gently grasp a lightweight hammer near the head. As this exercise gets easier for you, try holding the hammer farther down the handle. 8. Without moving your elbow, slowly rotate your palm up so it faces the ceiling. 9. Hold this position for___10_______ seconds. 10. Slowly return to the starting position. Repeat ____3______ times. Complete this exercise ______2____ times a day. Exercise D: Pronation  6. Sit with your left / right forearm supported on a table. Your elbow should be at waist height. 7. Rest your hand over the edge of the table, palm-up. 8. Gently grasp a lightweight hammer near the head. As this exercise gets easier for you, try holding the hammer farther down the handle. 9. Without moving your elbow, slowly rotate your palm down. 10. Hold this position for _____10_____ seconds. 11. Slowly return to the starting position. Repeat ____3______ times. Complete this exercise _______2___ times a day. Exercise E: Elbow flexion, supinated  7. Sit on a stable chair without armrests, or stand. 8. If directed, hold a ___none_______ weight in your left / right hand, or hold an exercise band with both hands. Your palms should face up toward the ceiling at the starting position. 9. Bend your left / right elbow and move your hand up toward your shoulder. Keep your other arm straight down, in the starting position. 10. Slowly return to the starting position. Repeat _______3___ times. Complete this exercise __2________ times a day. Exercise F: Elbow  extension  1. Lie on your back. 2. Hold a ___none_______ weight in your left / right hand. 3. Bend your left / right elbow to an "L" shape (90 degrees) so your elbow is pointed up to the ceiling and the weight is overhead. 4. Straighten your elbow, raising your hand toward the ceiling. Use your other hand to support your left / right upper arm and to keep it still. 5. Slowly return to the starting position. Repeat _____3_____ times. Complete this exercise _____2_____ times a day. This information is not intended to replace advice given to you by your health care provider. Make sure you discuss  any questions you have with your health care provider. Document Released: 11/13/2005 Document Revised: 07/20/2016 Document Reviewed: 10/22/2015 Elsevier Interactive Patient Education  [employer].

## 2018-11-13 ENCOUNTER — Ambulatory Visit: Payer: PRIVATE HEALTH INSURANCE | Admitting: Neurology

## 2018-11-13 ENCOUNTER — Encounter: Payer: Self-pay | Admitting: Neurology

## 2018-11-13 VITALS — BP 152/90 | HR 76 | Ht 61.0 in | Wt 203.0 lb

## 2018-11-13 DIAGNOSIS — G4733 Obstructive sleep apnea (adult) (pediatric): Secondary | ICD-10-CM | POA: Diagnosis not present

## 2018-11-13 DIAGNOSIS — Z9989 Dependence on other enabling machines and devices: Secondary | ICD-10-CM | POA: Diagnosis not present

## 2018-11-13 NOTE — Patient Instructions (Addendum)
Please continue using your CPAP regularly. While your insurance requires that you use CPAP at least 4 hours each night on 70% of the nights, I recommend, that you not skip any nights and use it throughout the night if you can. Getting used to CPAP and staying with the treatment long term does take time and patience and discipline. Untreated obstructive sleep apnea when it is moderate to severe can have an adverse impact on cardiovascular health and raise her risk for heart disease, arrhythmias, hypertension, congestive heart failure, stroke and diabetes. Untreated obstructive sleep apnea causes sleep disruption, nonrestorative sleep, and sleep deprivation. This can have an impact on your day to day functioning and cause daytime sleepiness and impairment of cognitive function, memory loss, mood disturbance, and problems focussing. Using CPAP regularly can improve these symptoms.  We will work with you with the help of [facility] on the issue of mask leak and mask discomfort, etc. You may have to make an appt with them, please also address insurance coverage issues.   Keep up the good work! We can see you in 6 months, you can see one of our nurse practitioners as you are doing well.

## 2018-11-13 NOTE — Progress Notes (Signed)
Received this notice from [facility]:   "Patient has a $1500 yearly deductible which has not yet been met; therefore, she is receiving a bill for $76.00 per month during the 10 month rental. As of today she still has over $500 left of her deductible which will start over 11/27/2018.   Notes show she has spoken to our billing department, in [place], 3 times in the last 30 days. She has not spoken with anyone here locally.  I will have one of my RRTs reach out to her about a mask refit. I hope this helps."

## 2018-11-13 NOTE — Progress Notes (Signed)
Subjective:    Patient ID: Stephanie Chandler is a 52 y.o. female.  HPI     Interim history:   Stephanie Chandler is a 43 year old right-handed woman with an underlying medical history of asthma, arthritis, reflux disease, IBS and obesity, who presents for follow-up consultation of her obstructive sleep apnea, after recent sleep study testing concerning CPAP therapy. The patient is unaccompanied today. Her first met her on 06/19/2012 at the request of her primary care practitioner at which time she reported snoring and daytime somnolence as well as witnessed apneas with an Epworth sleepiness score of 19 at the time. She was advised to proceed with sleep study testing. She had a baseline sleep study, followed by a CPAP titration study. I went over her test results with her in detail today. Baseline sleep study from 07/05/2018 showed a sleep efficiency of 65.3%, sleep latency delayed at 111 minutes and REM latency of 108.5 minutes. She had a decreased percentage of REM sleep, increased percentage of stage II sleep. Total AHI was 10.7 per hour, REM AHI severe at 53.8 per hour, supine AHI in the severe range at 30 per hour. Average oxygen saturation was 95%, nadir was 81%. She had no significant PLMS. She was advised to return for a full night titration study. She had this on 08/09/2018. Sleep efficiency was 87.2%, sleep latency 22.5 minutes, REM latency slightly reduced at 63 minutes. She was fitted with a small nasal mask and CPAP was titrated from 5 cm to 10 cm. On the final pressure her AHI was 0 per hour with supine REM sleep achieved an O2 nadir of 95%. She had no significant PLMS. Based on her test results I prescribed CPAP therapy for home use at a pressure of 10 cm.   Today, 11/13/2018: I reviewed her CPAP compliance data from 10/13/2001 through 11/11/2018 which is a total of 30 days, during which time she used her CPAP 25 days with percent used days greater than 4 hours at 57%, indicating slightly suboptimal  compliance with an average usage of only 4 hours and 39 minutes for treatment, residual AHI at goal at 3.5 per hour and average, leak on the high side with the 95th percentile at 33.2 L/m on a pressure of 10 cm with EPR of 3. Her compliance was better in the month of October, with percent used days greater than 4 hours at 70% at the time but leak was rather high at the time. Average usage of 4 hours and 59 minutes, residual AHI borderline at 5.6 per hour. She has felt better with regards to her EDS and nocturia is much better, she is highly motivated to continue with treatment. However, she has had instances of discomfort with the mask and the air leak is rather high and she does notice it. She has been in touch with her DME company but has not really had any resolution of the higher leak. Sometimes he falls asleep before she put the mask on and she is motivated to work on the compliance.   The patient's allergies, current medications, family history, past medical history, past social history, past surgical history and problem list were reviewed and updated as appropriate.   Previously:   06/19/2018: (She) reports snoring and excessive daytime somnolence as well as witnessed apneic breathing pauses while asleep per daughter's report. I reviewed your office note from 03/21/2018. Her Epworth sleepiness score is 19 out of 24, fatigue score is 39 out of 63. She is single, she lives  with her daughter. She has 2 children. She smokes cigarettes about 6 per day on average, drinks alcohol very rarely, caffeine in the form of soda or tea, but not daily. She is not aware of any family history of OSA. She denies telltale symptoms of restless leg syndrome and morning headaches but has nocturia about once or twice per average night. She has a TV on in her bedroom. Her family is complaining about her loud snoring. She lives with her 81 year old daughter, she also has a 37 year old daughter. Wake time is around 5:30. Bedtime  is between 11 and 11:15. She had a home sleep test in 2016 which showed mild obstructive sleep apnea.  Her Past Medical History Is Significant For: Past Medical History:  Diagnosis Date  . Arthritis     Her Past Surgical History Is Significant For: Past Surgical History:  Procedure Laterality Date  . ABDOMINAL HYSTERECTOMY    . HYSTERECTOMY ABDOMINAL WITH SALPINGECTOMY  1997    Her Family History Is Significant For: Family History  Problem Relation Age of Onset  . Diabetes Mother   . Hypertension Mother   . Arthritis Mother   . Diabetes Father   . Kidney disease Father   . Heart disease Maternal Grandmother   . Diabetes Paternal Grandmother     Her Social History Is Significant For: Social History   Socioeconomic History  . Marital status: Single    Spouse name: Not on file  . Number of children: Not on file  . Years of education: Not on file  . Highest education level: Not on file  Occupational History  . Not on file  Social Needs  . Financial resource strain: Not on file  . Food insecurity:    Worry: Not on file    Inability: Not on file  . Transportation needs:    Medical: Not on file    Non-medical: Not on file  Tobacco Use  . Smoking status: Light Tobacco Smoker    Packs/day: 0.12    Years: 0.50    Pack years: 0.06    Types: Cigarettes  . Smokeless tobacco: Never Used  Substance and Sexual Activity  . Alcohol use: Yes    Alcohol/week: 1.0 standard drinks    Types: 1 Glasses of wine per week    Comment: 1 glass a year  . Drug use: No  . Sexual activity: Never  Lifestyle  . Physical activity:    Days per week: Not on file    Minutes per session: Not on file  . Stress: Not on file  Relationships  . Social connections:    Talks on phone: Not on file    Gets together: Not on file    Attends religious service: Not on file    Active member of club or organization: Not on file    Attends meetings of clubs or organizations: Not on file     Relationship status: Not on file  Other Topics Concern  . Not on file  Social History Narrative  . Not on file    Her Allergies Are:  Allergies  Allergen Reactions  . Flagyl [Metronidazole] Hives  :   Her Current Medications Are:  Outpatient Encounter Medications as of 11/13/2018  Medication Sig  . albuterol (PROVENTIL HFA;VENTOLIN HFA) 108 (90 Base) MCG/ACT inhaler Inhale 1-2 puffs into the lungs every 4 (four) hours as needed for wheezing or shortness of breath.  . cyclobenzaprine (FLEXERIL) 10 MG tablet Take 0.5-1 tablets (5-10 mg total) by  mouth 3 (three) times daily as needed for muscle spasms.  . diazepam (VALIUM) 5 MG tablet Take 1 tablet (5 mg total) by mouth every 12 (twelve) hours as needed (dizziness).  Noelle Penner NICOTINE POLACRILEX 4 MG gum CHEW 1 PIECE EVERY 2 HOURS AS NEEDED FOR CRAVING, MAX 9 PIECES A DAY  . ibuprofen (ADVIL,MOTRIN) 800 MG tablet Take 1 tablet (800 mg total) by mouth 3 (three) times daily.  . nicotine ([employer] NICOTINE TRANSDERMAL SYS) 14 mg/24hr patch Place 1 patch (14 mg total) onto the skin daily.  Marland Kitchen omeprazole (PRILOSEC) 40 MG capsule TAKE 1 CAPSULE BY MOUTH ONCE DAILY FOR 90 DAYS  . cetirizine (ZYRTEC) 10 MG tablet Take 1 tablet (10 mg total) by mouth 2 (two) times daily as needed for up to 7 days for allergies.  . fluticasone (FLONASE) 50 MCG/ACT nasal spray Place 1 spray into both nostrils 2 (two) times daily.  . sodium chloride (OCEAN) 0.65 % SOLN nasal spray Place 2 sprays into both nostrils every 2 (two) hours while awake.  . [DISCONTINUED] Menthol, Topical Analgesic, (BIOFREEZE) 4 % GEL Apply 1 application topically 4 (four) times daily as needed for up to 7 days.   No facility-administered encounter medications on file as of 11/13/2018.   :  Review of Systems:  Out of a complete 14 point review of systems, all are reviewed and negative with the exception of these symptoms as listed below:  Review of Systems  Neurological:       Pt presents today  to discuss her cpap. Pt reports that she loves her cpap. She feels much better on therapy. Pt reports that sometimes that she falls asleep before  She is able to put her cpap on.    Objective:  Neurological Exam  Physical Exam Physical Examination:   Vitals:   11/13/18 0814  BP: (!) 152/90  Pulse: 76    General Examination: The patient is a very pleasant 52 y.o. female in no acute distress. She appears well-developed and well-nourished and well groomed.   HEENT: Normocephalic, atraumatic, pupils are equal, round and reactive to light and accommodation. Extraocular tracking is good without limitation to gaze excursion or nystagmus noted. Normal smooth pursuit is noted. Hearing is grossly intact. Face is symmetric with normal facial animation and normal facial sensation. Speech is clear with no dysarthria noted. There is no hypophonia. There is no lip, neck/head, jaw or voice tremor. Neck shows FROM. Oropharynx exam reveals: mild mouth dryness, adequate dental hygiene and mild airway crowding, tongue protrudes centrally and palate elevates symmetrically.    Chest: Clear to auscultation without wheezing, rhonchi or crackles noted.  Heart: S1+S2+0, regular and normal without murmurs, rubs or gallops noted.   Abdomen: Soft, non-tender and non-distended with normal bowel sounds appreciated on auscultation.  Extremities: There is trace pitting edema in the distal lower extremities bilaterally.  Skin: Warm and dry without trophic changes noted.  Musculoskeletal: exam reveals no obvious joint deformities, tenderness or joint swelling or erythema.   Neurologically:  Mental status: The patient is awake, alert and oriented in all 4 spheres. Her immediate and remote memory, attention, language skills and fund of knowledge are appropriate. There is no evidence of aphasia, agnosia, apraxia or anomia. Speech is clear with normal prosody and enunciation. Thought process is linear. Mood is  normal and affect is normal.  Cranial nerves II - XII are as described above under HEENT exam.  Motor exam: Normal bulk, strength and tone is noted. There is  no drift, tremor or rebound. Romberg is negative. Reflexes are 2+ throughout. Fine motor skills and coordination: intact with normal finger taps, normal hand movements, normal rapid alternating patting, normal foot taps and normal foot agility.  Cerebellar testing: No dysmetria or intention tremor on finger to nose testing. Heel to shin is unremarkable bilaterally. There is no truncal or gait ataxia.  Sensory exam: intact to light touch in the upper and lower extremities.  Gait, station and balance: She stands easily. No veering to one side is noted. No leaning to one side is noted. Posture is age-appropriate and stance is narrow based. Gait shows normal stride length and normal pace. No problems turning are noted.  Assessment and Plan:  In summary, JEHIELI BRASSELL is a very pleasant 52 year old female with an underlying medical history of asthma, arthritis, reflux disease, IBS and obesity, who presents for follow-up consultation of her obstructive sleep apnea which was determined to be in the overall mild range by baseline sleep study testing in August 2019, but she did have severe REM related and positional OSA. She had a good titration study in September 2019 and has been on CPAP therapy at a pressure of 10 cm at home with overall adequate compliance but lately she has had more issues with air leakage and the headgear sliding off. She has been in touch with her DME company but is waiting to hear back. We will also email them on her behalf. She is highly motivated to continue with treatment and has benefited quite well from using her CPAP and that she feels much better rested, she has much less daytime somnolence, nocturia is better as well, in fact, her Epworth sleepiness score is only 6 out of 24 today, previously 19.  Physical exam is stable. She  is commended for her treatment adherence, we will have her work on her mask in air leak issues with the help of her DME company. She was advised to follow-up routinely with foot of her nurse practitioners in 6 months, sooner if needed. I answered all her questions today and she was in agreement. Hopefully, she can be seen on a yearly basis after that. I spent 25 minutes in total face-to-face time with the patient, more than 50% of which was spent in counseling and coordination of care, reviewing test results, reviewing medication and discussing or reviewing the diagnosis of OSA, its prognosis and treatment options. Pertinent laboratory and imaging test results that were available during this visit with the patient were reviewed by me and considered in my medical decision making (see chart for details).

## 2018-12-05 ENCOUNTER — Ambulatory Visit: Payer: Self-pay | Admitting: Registered Nurse

## 2018-12-05 ENCOUNTER — Encounter: Payer: Self-pay | Admitting: Registered Nurse

## 2018-12-05 VITALS — BP 122/72 | HR 81 | Temp 98.6°F

## 2018-12-05 DIAGNOSIS — J0101 Acute recurrent maxillary sinusitis: Secondary | ICD-10-CM

## 2018-12-05 DIAGNOSIS — H6692 Otitis media, unspecified, left ear: Secondary | ICD-10-CM

## 2018-12-05 DIAGNOSIS — H8112 Benign paroxysmal vertigo, left ear: Secondary | ICD-10-CM

## 2018-12-05 MED ORDER — FLUCONAZOLE 150 MG PO TABS: 150.0000 mg | ORAL_TABLET | Freq: Once | ORAL | 0 refills | Status: AC

## 2018-12-05 MED ORDER — MECLIZINE HCL 25 MG PO TABS: 25.0000 mg | ORAL_TABLET | Freq: Four times a day (QID) | ORAL | 0 refills | Status: AC | PRN

## 2018-12-05 MED ORDER — AMOXICILLIN-POT CLAVULANATE 875-125 MG PO TABS: 1.0000 | ORAL_TABLET | Freq: Two times a day (BID) | ORAL | 0 refills | Status: DC

## 2018-12-05 MED ORDER — FLUTICASONE PROPIONATE 50 MCG/ACT NA SUSP: 1.0000 | Freq: Two times a day (BID) | NASAL | 6 refills | Status: DC

## 2018-12-05 NOTE — Patient Instructions (Addendum)
Otitis Media, Adult  Otitis media occurs when there is inflammation and fluid in the middle ear. Your middle ear is a part of the ear that contains bones for hearing as well as air that helps send sounds to your brain. What are the causes? This condition is caused by a blockage in the eustachian tube. This tube drains fluid from the ear to the back of the nose (nasopharynx). A blockage in this tube can be caused by an object or by swelling (edema) in the tube. Problems that can cause a blockage include:  A cold or other upper respiratory infection.  Allergies.  An irritant, such as tobacco smoke.  Enlarged adenoids. The adenoids are areas of soft tissue located high in the back of the throat, behind the nose and the roof of the mouth.  A mass in the nasopharynx.  Damage to the ear caused by pressure changes (barotrauma). What are the signs or symptoms? Symptoms of this condition include:  Ear pain.  A fever.  Decreased hearing.  A headache.  Tiredness (lethargy).  Fluid leaking from the ear.  Ringing in the ear. How is this diagnosed? This condition is diagnosed with a physical exam. During the exam your health care provider will use an instrument called an otoscope to look into your ear and check for redness, swelling, and fluid. He or she will also ask about your symptoms. Your health care provider may also order tests, such as:  A test to check the movement of the eardrum (pneumatic otoscopy). This test is done by squeezing a small amount of air into the ear.  A test that changes air pressure in the middle ear to check how well the eardrum moves and whether the eustachian tube is working (tympanogram). How is this treated? This condition usually goes away on its own within 3-5 days. But if the condition is caused by a bacteria infection and does not go away own its own, or keeps coming back, your health care provider may:  Prescribe antibiotic medicines to treat the  infection.  Prescribe or recommend medicines to control pain. Follow these instructions at home:  Take over-the-counter and prescription medicines only as told by your health care provider.  If you were prescribed an antibiotic medicine, take it as told by your health care provider. Do not stop taking the antibiotic even if you start to feel better.  Keep all follow-up visits as told by your health care provider. This is important. Contact a health care provider if:  You have bleeding from your nose.  There is a lump on your neck.  You are not getting better in 5 days.  You feel worse instead of better. Get help right away if:  You have severe pain that is not controlled with medicine.  You have swelling, redness, or pain around your ear.  You have stiffness in your neck.  A part of your face is paralyzed.  The bone behind your ear (mastoid) is tender when you touch it.  You develop a severe headache. Summary  Otitis media is redness, soreness, and swelling of the middle ear.  This condition usually goes away on its own within 3-5 days.  If the problem does not go away in 3-5 days, your health care provider may prescribe or recommend medicines to treat your symptoms.  If you were prescribed an antibiotic medicine, take it as told by your health care provider. This information is not intended to replace advice given to you by   your health care provider. Make sure you discuss any questions you have with your health care provider. Document Released: 08/18/2004 Document Revised: 11/03/2016 Document Reviewed: 11/03/2016 Elsevier Interactive Patient Education  2019 [employer] How to Perform a Sinus Rinse A sinus rinse is a home treatment that is used to rinse your sinuses with a sterile mixture of salt and water (saline solution). Sinuses are air-filled spaces in your skull behind the bones of your face and forehead that open into your nasal cavity. A sinus rinse can help  to clear mucus, dirt, dust, or pollen from your nasal cavity. You may do a sinus rinse when you have a cold, a virus, nasal allergy symptoms, a sinus infection, or stuffiness in your nose or sinuses. Talk with your health care provider about whether a sinus rinse might help you. What are the risks? A sinus rinse is generally safe and effective. However, there are a few risks, which include:  A burning sensation in your sinuses. This may happen if you do not make the saline solution as directed. Be sure to follow all directions when making the saline solution.  Nasal irritation.  Infection from contaminated water. This is rare, but possible. Do not do a sinus rinse if you have had ear or nasal surgery, ear infection, or blocked ears. Supplies needed:  Saline solution or powder.  Distilled or sterile water may be needed to mix with saline powder. ? You may use boiled and cooled tap water. Boil tap water for 5 minutes; cool until it is lukewarm. Use within 24 hours. ? Do not use regular tap water to mix with the saline solution.  Neti pot or nasal rinse bottle. These supplies release the saline solution into your nose and through your sinuses. Neti pots and nasal rinse bottles can be purchased at [facility] local [facility], a health food store, or online. How to perform a sinus rinse  1. Wash your hands with soap and water. 2. Wash your device according to the directions that came with the product and then dry it. 3. Use the solution that comes with your product or one that is sold separately in stores. Follow the mixing directions on the package if you need to mix with sterile or distilled water. 4. Fill the device with the amount of saline solution noted in the device instructions. 5. Stand over a sink and tilt your head sideways over the sink. 6. Place the spout of the device in your upper nostril (the one closer to the ceiling). 7. Gently pour or squeeze the saline solution into your nasal  cavity. The liquid should drain out from the lower nostril if you are not too congested. 8. While rinsing, breathe through your open mouth. 9. Gently blow your nose to clear any mucus and rinse solution. Blowing too hard may cause ear pain. 10. Repeat in your other nostril. 11. Clean and rinse your device with clean water and then air-dry it. Talk with your health care provider or pharmacist if you have questions about how to do a sinus rinse. Summary  A sinus rinse is a home treatment that is used to rinse your sinuses with a sterile mixture of salt and water (saline solution).  A sinus rinse is generally safe and effective. Follow all instructions carefully.  Before doing a sinus rinse, talk with your health care provider about whether it would be helpful for you. This information is not intended to replace advice given to you by your health care provider.  Make sure you discuss any questions you have with your health care provider. Document Released: 06/10/2014 Document Revised: 09/10/2017 Document Reviewed: 09/10/2017 Elsevier Interactive Patient Education  2019 [employer] Sinusitis, Adult Sinusitis is inflammation of your sinuses. Sinuses are hollow spaces in the bones around your face. Your sinuses are located:  Around your eyes.  In the middle of your forehead.  Behind your nose.  In your cheekbones. Mucus normally drains out of your sinuses. When your nasal tissues become inflamed or swollen, mucus can become trapped or blocked. This allows bacteria, viruses, and fungi to grow, which leads to infection. Most infections of the sinuses are caused by a virus. Sinusitis can develop quickly. It can last for up to 4 weeks (acute) or for more than 12 weeks (chronic). Sinusitis often develops after a cold. What are the causes? This condition is caused by anything that creates swelling in the sinuses or stops mucus from draining. This includes:  Allergies.  Asthma.  Infection  from bacteria or viruses.  Deformities or blockages in your nose or sinuses.  Abnormal growths in the nose (nasal polyps).  Pollutants, such as chemicals or irritants in the air.  Infection from fungi (rare). What increases the risk? You are more likely to develop this condition if you:  Have a weak body defense system (immune system).  Do a lot of swimming or diving.  Overuse nasal sprays.  Smoke. What are the signs or symptoms? The main symptoms of this condition are pain and a feeling of pressure around the affected sinuses. Other symptoms include:  Stuffy nose or congestion.  Thick drainage from your nose.  Swelling and warmth over the affected sinuses.  Headache.  Upper toothache.  A cough that may get worse at night.  Extra mucus that collects in the throat or the back of the nose (postnasal drip).  Decreased sense of smell and taste.  Fatigue.  A fever.  Sore throat.  Bad breath. How is this diagnosed? This condition is diagnosed based on:  Your symptoms.  Your medical history.  A physical exam.  Tests to find out if your condition is acute or chronic. This may include: ? Checking your nose for nasal polyps. ? Viewing your sinuses using a device that has a light (endoscope). ? Testing for allergies or bacteria. ? Imaging tests, such as an MRI or CT scan. In rare cases, a bone biopsy may be done to rule out more serious types of fungal sinus disease. How is this treated? Treatment for sinusitis depends on the cause and whether your condition is chronic or acute.  If caused by a virus, your symptoms should go away on their own within 10 days. You may be given medicines to relieve symptoms. They include: ? Medicines that shrink swollen nasal passages (topical intranasal decongestants). ? Medicines that treat allergies (antihistamines). ? A spray that eases inflammation of the nostrils (topical intranasal corticosteroids). ? Rinses that help get  rid of thick mucus in your nose (nasal saline washes).  If caused by bacteria, your health care provider may recommend waiting to see if your symptoms improve. Most bacterial infections will get better without antibiotic medicine. You may be given antibiotics if you have: ? A severe infection. ? A weak immune system.  If caused by narrow nasal passages or nasal polyps, you may need to have surgery. Follow these instructions at home: Medicines  Take, use, or apply over-the-counter and prescription medicines only as told by your health care provider. These  may include nasal sprays.  If you were prescribed an antibiotic medicine, take it as told by your health care provider. Do not stop taking the antibiotic even if you start to feel better. Hydrate and humidify   Drink enough fluid to keep your urine pale yellow. Staying hydrated will help to thin your mucus.  Use a cool mist humidifier to keep the humidity level in your home above 50%.  Inhale steam for 10-15 minutes, 3-4 times a day, or as told by your health care provider. You can do this in the bathroom while a hot shower is running.  Limit your exposure to cool or dry air. Rest  Rest as much as possible.  Sleep with your head raised (elevated).  Make sure you get enough sleep each night. General instructions   Apply a warm, moist washcloth to your face 3-4 times a day or as told by your health care provider. This will help with discomfort.  Wash your hands often with soap and water to reduce your exposure to germs. If soap and water are not available, use hand sanitizer.  Do not smoke. Avoid being around people who are smoking (secondhand smoke).  Keep all follow-up visits as told by your health care provider. This is important. Contact a health care provider if:  You have a fever.  Your symptoms get worse.  Your symptoms do not improve within 10 days. Get help right away if:  You have a severe headache.  You  have persistent vomiting.  You have severe pain or swelling around your face or eyes.  You have vision problems.  You develop confusion.  Your neck is stiff.  You have trouble breathing. Summary  Sinusitis is soreness and inflammation of your sinuses. Sinuses are hollow spaces in the bones around your face.  This condition is caused by nasal tissues that become inflamed or swollen. The swelling traps or blocks the flow of mucus. This allows bacteria, viruses, and fungi to grow, which leads to infection.  If you were prescribed an antibiotic medicine, take it as told by your health care provider. Do not stop taking the antibiotic even if you start to feel better.  Keep all follow-up visits as told by your health care provider. This is important. This information is not intended to replace advice given to you by your health care provider. Make sure you discuss any questions you have with your health care provider. Document Released: 11/13/2005 Document Revised: 04/15/2018 Document Reviewed: 04/15/2018 Elsevier Interactive Patient Education  2019 [employer]  Benign Positional Vertigo Vertigo is the feeling that you or your surroundings are moving when they are not. Benign positional vertigo is the most common form of vertigo. This is usually a harmless condition (benign). This condition is positional. This means that symptoms are triggered by certain movements and positions. This condition can be dangerous if it occurs while you are doing something that could cause harm to you or others. This includes activities such as driving or operating machinery. What are the causes? In many cases, the cause of this condition is not known. It may be caused by a disturbance in an area of the inner ear that helps your brain to sense movement and balance. This disturbance can be caused by:  Viral infection (labyrinthitis).  Head injury.  Repetitive motion, such as jumping, dancing, or  running. What increases the risk? You are more likely to develop this condition if:  You are a woman.  You are 50 years  of age or older. What are the signs or symptoms? Symptoms of this condition usually happen when you move your head or your eyes in different directions. Symptoms may start suddenly, and usually last for less than a minute. They include:  Loss of balance and falling.  Feeling like you are spinning or moving.  Feeling like your surroundings are spinning or moving.  Nausea and vomiting.  Blurred vision.  Dizziness.  Involuntary eye movement (nystagmus). Symptoms can be mild and cause only minor problems, or they can be severe and interfere with daily life. Episodes of benign positional vertigo may return (recur) over time. Symptoms may improve over time. How is this diagnosed? This condition may be diagnosed based on:  Your medical history.  Physical exam of the head, neck, and ears.  Tests, such as: ? MRI. ? CT scan. ? Eye movement tests. Your health care provider may ask you to change positions quickly while he or she watches you for symptoms of benign positional vertigo, such as nystagmus. Eye movement may be tested with a variety of exams that are designed to evaluate or stimulate vertigo. ? An electroencephalogram (EEG). This records electrical activity in your brain. ? Hearing tests. You may be referred to a health care provider who specializes in ear, nose, and throat (ENT) problems (otolaryngologist) or a provider who specializes in disorders of the nervous system (neurologist). How is this treated?  This condition may be treated in a session in which your health care provider moves your head in specific positions to adjust your inner ear back to normal. Treatment for this condition may take several sessions. Surgery may be needed in severe cases, but this is rare. In some cases, benign positional vertigo may resolve on its own in 2-4 weeks. Follow  these instructions at home: Safety  Move slowly. Avoid sudden body or head movements or certain positions, as told by your health care provider.  Avoid driving until your health care provider says it is safe for you to do so.  Avoid operating heavy machinery until your health care provider says it is safe for you to do so.  Avoid doing any tasks that would be dangerous to you or others if vertigo occurs.  If you have trouble walking or keeping your balance, try using a cane for stability. If you feel dizzy or unstable, sit down right away.  Return to your normal activities as told by your health care provider. Ask your health care provider what activities are safe for you. General instructions  Take over-the-counter and prescription medicines only as told by your health care provider.  Drink enough fluid to keep your urine pale yellow.  Keep all follow-up visits as told by your health care provider. This is important. Contact a health care provider if:  You have a fever.  Your condition gets worse or you develop new symptoms.  Your family or friends notice any behavioral changes.  You have nausea or vomiting that gets worse.  You have numbness or a "pins and needles" sensation. Get help right away if you:  Have difficulty speaking or moving.  Are always dizzy.  Faint.  Develop severe headaches.  Have weakness in your legs or arms.  Have changes in your hearing or vision.  Develop a stiff neck.  Develop sensitivity to light. Summary  Vertigo is the feeling that you or your surroundings are moving when they are not. Benign positional vertigo is the most common form of vertigo.  The cause  of this condition is not known. It may be caused by a disturbance in an area of the inner ear that helps your brain to sense movement and balance.  Symptoms include loss of balance and falling, feeling that you or your surroundings are moving, nausea and vomiting, and blurred  vision.  This condition can be diagnosed based on symptoms, physical exam, and other tests, such as MRI, CT scan, eye movement tests, and hearing tests.  Follow safety instructions as told by your health care provider. You will also be told when to contact your health care provider in case of problems. This information is not intended to replace advice given to you by your health care provider. Make sure you discuss any questions you have with your health care provider. Document Released: 08/21/2006 Document Revised: 04/24/2018 Document Reviewed: 04/24/2018 Elsevier Interactive Patient Education  2019 [employer]

## 2018-12-05 NOTE — Progress Notes (Signed)
Subjective:    Patient ID: Stephanie Chandler, female    DOB: November 24, 1966, 53 y.o.   MRN: [id number]  52y/o african [place] female established patient here for evaluation sinus pressure and left ear pain, rhinitis, sore throat.  + sick contacts at work viral URIs  Tried flonase, saline thinks she needs antibiotic and diflucan if given antibiotic prone to vaginal candiasis after oral antibiotics.  Has had augmentin  In the past year recurrent sinusitis and otitis media with vertigo.  Some dizziness with head down positions this week.  Has not tried meclizine "I think I ran out but will need to check at home"      Review of Systems  Constitutional: Negative for activity change, appetite change, chills, diaphoresis, fatigue, fever and unexpected weight change.  HENT: Positive for congestion, ear pain, postnasal drip, rhinorrhea, sinus pressure, sinus pain and sore throat. Negative for dental problem, drooling, ear discharge, facial swelling, hearing loss, mouth sores, nosebleeds, sneezing, tinnitus, trouble swallowing and voice change.   Eyes: Negative for photophobia, pain, discharge, redness, itching and visual disturbance.  Respiratory: Positive for cough. Negative for choking, chest tightness, shortness of breath, wheezing and stridor.   Cardiovascular: Negative for chest pain, palpitations and leg swelling.  Gastrointestinal: Negative for abdominal distention, abdominal pain, diarrhea, nausea and vomiting.  Endocrine: Negative for cold intolerance and heat intolerance.  Genitourinary: Negative for difficulty urinating, dysuria and hematuria.  Musculoskeletal: Negative for arthralgias, back pain, gait problem, joint swelling, myalgias, neck pain and neck stiffness.  Skin: Negative for color change, pallor, rash and wound.  Allergic/Immunologic: Positive for environmental allergies. Negative for food allergies.  Neurological: Positive for dizziness. Negative for tremors, seizures, syncope, facial  asymmetry, speech difficulty, weakness, light-headedness, numbness and headaches.  Hematological: Negative for adenopathy. Does not bruise/bleed easily.  Psychiatric/Behavioral: Negative for agitation, behavioral problems, confusion and sleep disturbance.       Objective:   Physical Exam Vitals signs and nursing note reviewed.  Constitutional:      General: She is not in acute distress.    Appearance: Normal appearance. She is well-developed and well-groomed. She is obese. She is ill-appearing. She is not toxic-appearing or diaphoretic.  HENT:     Head: Normocephalic and atraumatic.     Jaw: There is normal jaw occlusion. No trismus.     Right Ear: Hearing, ear canal and external ear normal. A middle ear effusion is present. There is no impacted cerumen.     Left Ear: Hearing, ear canal and external ear normal. A middle ear effusion is present. There is no impacted cerumen. Tympanic membrane is erythematous and bulging.     Nose: Nasal tenderness, mucosal edema, congestion and rhinorrhea present. No nasal deformity, septal deviation or laceration.     Right Turbinates: Enlarged and swollen. Not pale.     Left Turbinates: Enlarged and swollen. Not pale.     Right Sinus: Maxillary sinus tenderness present. No frontal sinus tenderness.     Left Sinus: Maxillary sinus tenderness present. No frontal sinus tenderness.     Comments: Cobblestoning posterior pharynx; bilateral allergic shiners; left TM erythema bulging air fluid level clear; right TM air fluid level 50% opacity; nasal turbinates edema/erythema clear dicharge    Mouth/Throat:     Lips: Pink. No lesions.     Mouth: Mucous membranes are moist. Mucous membranes are not pale, not dry and not cyanotic. No lacerations or oral lesions.     Dentition: Normal dentition. Does not have dentures. No dental  caries or dental abscesses.     Tongue: No lesions.     Pharynx: Uvula midline. Pharyngeal swelling and posterior oropharyngeal erythema  present. No oropharyngeal exudate or uvula swelling.     Tonsils: No tonsillar exudate or tonsillar abscesses. Swelling: 0 on the right. 0 on the left.  Eyes:     General: Lids are normal. Allergic shiner present. No scleral icterus.       Right eye: No foreign body, discharge or hordeolum.        Left eye: No foreign body, discharge or hordeolum.     Extraocular Movements: Extraocular movements intact.     Right eye: Normal extraocular motion and no nystagmus.     Left eye: Normal extraocular motion and no nystagmus.     Conjunctiva/sclera: Conjunctivae normal.     Right eye: Right conjunctiva is not injected. No chemosis, exudate or hemorrhage.    Left eye: Left conjunctiva is not injected. No chemosis, exudate or hemorrhage.    Pupils: Pupils are equal, round, and reactive to light. Pupils are equal.     Right eye: Pupil is round and reactive.     Left eye: Pupil is round and reactive.  Neck:     Musculoskeletal: Normal range of motion and neck supple. Normal range of motion. No edema, erythema, neck rigidity, spinous process tenderness or muscular tenderness.     Thyroid: No thyroid mass or thyromegaly.     Trachea: Trachea normal. No tracheal tenderness or tracheal deviation.  Cardiovascular:     Rate and Rhythm: Normal rate and regular rhythm.     Chest Wall: PMI is not displaced.     Heart sounds: Normal heart sounds, S1 normal and S2 normal. No murmur. No friction rub. No gallop.   Pulmonary:     Effort: Pulmonary effort is normal. No accessory muscle usage or respiratory distress.     Breath sounds: Normal breath sounds and air entry. No stridor, decreased air movement or transmitted upper airway sounds. No decreased breath sounds, wheezing, rhonchi or rales.     Comments: No cough observed in exam room; spoke full sentences without difficulty Chest:     Chest wall: No tenderness.  Abdominal:     General: There is no distension.     Palpations: Abdomen is soft.   Musculoskeletal: Normal range of motion.        General: No tenderness.     Right shoulder: Normal.     Left shoulder: Normal.     Right elbow: Normal.    Left elbow: Normal.     Right hip: Normal.     Left hip: Normal.     Right knee: Normal.     Left knee: Normal.     Cervical back: Normal.     Thoracic back: Normal.     Lumbar back: Normal.     Right hand: Normal.     Left hand: Normal.     Right lower leg: No edema.     Left lower leg: No edema.  Lymphadenopathy:     Head:     Right side of head: No submental, submandibular, tonsillar, preauricular, posterior auricular or occipital adenopathy.     Left side of head: No submental, submandibular, tonsillar, preauricular, posterior auricular or occipital adenopathy.     Cervical: No cervical adenopathy.     Right cervical: No superficial, deep or posterior cervical adenopathy.    Left cervical: No superficial, deep or posterior cervical adenopathy.  Skin:  General: Skin is warm and dry.     Capillary Refill: Capillary refill takes less than 2 seconds.     Coloration: Skin is not ashen, cyanotic, jaundiced, mottled, pale or sallow.     Findings: No abrasion, bruising, burn, ecchymosis, erythema, laceration, lesion, petechiae or rash.     Nails: There is no clubbing.   Neurological:     General: No focal deficit present.     Mental Status: She is alert and oriented to person, place, and time. Mental status is at baseline. She is not disoriented.     GCS: GCS eye subscore is 4. GCS verbal subscore is 5. GCS motor subscore is 6.     Cranial Nerves: No cranial nerve deficit.     Sensory: No sensory deficit.     Motor: No weakness, tremor, atrophy, abnormal muscle tone or seizure activity.     Coordination: Coordination normal.     Gait: Gait normal.     Comments: In/out of chair and on/off exam table without difficulty; gait sure and steady in hallway  Psychiatric:        Attention and Perception: Attention and perception  normal.        Mood and Affect: Mood and affect normal.        Speech: Speech normal.        Behavior: Behavior normal. Behavior is cooperative.        Thought Content: Thought content normal.        Cognition and Memory: Cognition and memory normal.        Judgment: Judgment normal.     Maxillary sinus and frontal sinuses TTP, left TM erythema bulging; right TM air fluid level with 50% opacity. Cobblestoning posterior pharynx; nasal turbinates edema/erythema clear discharge; bilateral allergic shiners      Assessment & Plan:  A-Supportive treatment. Augmentin 875mg  po BID x 10 days #20 RF0 dispensed from [facility] to patient  Patient worried about vaginal yeast after augmentin has occurred in the past electronic Rx to her pharmacy of choice diflucan 150mg  po x 1 onset symptoms #1 RF0 to hold omeprazole on the day she takes diflucan as it can raise omeprazole blood levels.  Tylenol 1000mg  po QID prn pain/fever.   No evidence of invasive bacterial infection, non toxic and well hydrated.  This is most likely self limiting viral infection.  I do not see where any further testing or imaging is necessary at this time.   I will suggest supportive care, rest, good hygiene and encourage the patient to take adequate fluids.  The patient is to return to clinic or EMERGENCY ROOM if symptoms worsen or change significantly e.g. ear pain, fever, purulent discharge from ears or bleeding.  Exitcare handout on otitis media   Patient verbalized agreement and understanding of treatment plan.    continue flonase 1 spray each nostril BID, saline 2 sprays each nostril q2h wa prn congestion.  If no improvement with 48 hours of saline and flonase use start augmentin 875mg  po BID x 10 days #20 RF0 dispensed from [facility] to patient  Denied personal or family history of ENT cancer.  Shower BID especially prior to bed. No evidence of systemic bacterial infection, non toxic and well hydrated.  I do not see where any further testing or  imaging is necessary at this time.   I will suggest supportive care, rest, good hygiene and encourage the patient to take adequate fluids.  The patient is to return to clinic or EMERGENCY  ROOM if symptoms worsen or change significantly.  Exitcare handout on sinusitis and sinus rinse.  Patient verbalized agreement and understanding of treatment plan and had no further questions at this time.   P2:  Hand washing and cover cough  Discussed with patient otitis media left and right with effusion probably causing vertigo but could also be age. Meclizine 25mg  po prn helpful in the past. Supportive treatment may take up to 4 doses meclizine per day max 100mg  per 24 hours. Thinks she may be out of medication electronic Rx meclizine 25mg  po QID prn vertigo or OTC dramamine with active ingredient meclizine.  Discussed signs/symptoms stroke.  Someone to drive her home if having vertigo with turning head in car. recommended not driving during vertigo episodes. Follow up if aphasia, dysphasia, visual changes, weakness, fall, worst headache of life, incoordination, fever, ear discharge. Consider ENT evaluation/follow up with PCM if worsening symptoms not controlled with meclizine.  Exitcare handout on vertigo.   Patient verbalized understanding of information/agreed with plan of care and had no further questions at this time.

## 2018-12-12 ENCOUNTER — Encounter: Payer: Self-pay | Admitting: Registered Nurse

## 2018-12-12 ENCOUNTER — Ambulatory Visit: Payer: Self-pay | Admitting: Registered Nurse

## 2018-12-12 VITALS — BP 142/90 | HR 85 | Temp 99.3°F

## 2018-12-12 DIAGNOSIS — R059 Cough, unspecified: Secondary | ICD-10-CM

## 2018-12-12 DIAGNOSIS — Z6841 Body Mass Index (BMI) 40.0 and over, adult: Secondary | ICD-10-CM

## 2018-12-12 DIAGNOSIS — H6983 Other specified disorders of Eustachian tube, bilateral: Secondary | ICD-10-CM

## 2018-12-12 DIAGNOSIS — R05 Cough: Secondary | ICD-10-CM

## 2018-12-12 DIAGNOSIS — J0101 Acute recurrent maxillary sinusitis: Secondary | ICD-10-CM

## 2018-12-12 DIAGNOSIS — E669 Obesity, unspecified: Secondary | ICD-10-CM | POA: Insufficient documentation

## 2018-12-12 MED ORDER — BENZONATATE 200 MG PO CAPS: 200.0000 mg | ORAL_CAPSULE | Freq: Two times a day (BID) | ORAL | 0 refills | Status: AC | PRN

## 2018-12-12 MED ORDER — ALBUTEROL SULFATE HFA 108 (90 BASE) MCG/ACT IN AERS: 1.0000 | INHALATION_SPRAY | RESPIRATORY_TRACT | 1 refills | Status: DC | PRN

## 2018-12-12 NOTE — Patient Instructions (Addendum)
How to Use a Metered Dose Inhaler A metered dose inhaler is a handheld device for taking medicine that must be breathed into the lungs (inhaled). The device can be used to deliver a variety of inhaled medicines, including:  Quick relief or rescue medicines, such as bronchodilators.  Controller medicines, such as corticosteroids. The medicine is delivered by pushing down on a metal canister to release a preset amount of spray and medicine. Each device contains the amount of medicine that is needed for a preset number of uses (inhalations). Your health care provider may recommend that you use a spacer with your inhaler to help you take the medicine more effectively. A spacer is a plastic tube with a mouthpiece on one end and an opening that connects to the inhaler on the other end. A spacer holds the medicine in a tube for a short time, which allows you to inhale more medicine. What are the risks? If you do not use your inhaler correctly, medicine might not reach your lungs to help you breathe. Inhaler medicine can cause side effects, such as:  Mouth or throat infection.  Cough.  Hoarseness.  Headache.  Nausea and vomiting.  Lung infection (pneumonia) in people who have a lung condition called COPD. How to use a metered dose inhaler without a spacer  1. Remove the cap from the inhaler. 2. If you are using the inhaler for the first time, shake it for 5 seconds, turn it away from your face, then release 4 puffs into the air. This is called priming. 3. Shake the inhaler for 5 seconds. 4. Position the inhaler so the top of the canister faces up. 5. Put your index finger on the top of the medicine canister. Support the bottom of the inhaler with your thumb. 6. Breathe out normally and as completely as possible, away from the inhaler. 7. Either place the inhaler between your teeth and close your lips tightly around the mouthpiece, or hold the inhaler 1-2 inches (2.5-5 cm) away from your open  mouth. Keep your tongue down out of the way. If you are unsure which technique to use, ask your health care provider. 8. Press the canister down with your index finger to release the medicine, then inhale deeply and slowly through your mouth (not your nose) until your lungs are completely filled. Inhaling should take 4-6 seconds. 9. Hold the medicine in your lungs for 5-10 seconds (10 seconds is best). This helps the medicine get into the small airways of your lungs. 10. With your lips in a tight circle (pursed), breathe out slowly. 11. Repeat steps 3-10 until you have taken the number of puffs that your health care provider directed. Wait about 1 minute between puffs or as directed. 12. Put the cap on the inhaler. 13. If you are using a steroid inhaler, rinse your mouth with water, gargle, and spit out the water. Do not swallow the water. How to use a metered dose inhaler with a spacer  1. Remove the cap from the inhaler. 2. If you are using the inhaler for the first time, shake it for 5 seconds, turn it away from your face, then release 4 puffs into the air. This is called priming. 3. Shake the inhaler for 5 seconds. 4. Place the open end of the spacer onto the inhaler mouthpiece. 5. Position the inhaler so the top of the canister faces up and the spacer mouthpiece faces you. 6. Put your index finger on the top of the medicine canister.   Support the bottom of the inhaler and the spacer with your thumb. 7. Breathe out normally and as completely as possible, away from the spacer. 8. Place the spacer between your teeth and close your lips tightly around it. Keep your tongue down out of the way. 9. Press the canister down with your index finger to release the medicine, then inhale deeply and slowly through your mouth (not your nose) until your lungs are completely filled. Inhaling should take 4-6 seconds. 10. Hold the medicine in your lungs for 5-10 seconds (10 seconds is best). This helps the  medicine get into the small airways of your lungs. 11. With your lips in a tight circle (pursed), breathe out slowly. 12. Repeat steps 3-11 until you have taken the number of puffs that your health care provider directed. Wait about 1 minute between puffs or as directed. 13. Remove the spacer from the inhaler and put the cap on the inhaler. 14. If you are using a steroid inhaler, rinse your mouth with water, gargle, and spit out the water. Do not swallow the water. Follow these instructions at home:  Take your inhaled medicine only as told by your health care provider. Do not use the inhaler more than directed by your health care provider.  Keep all follow-up visits as told by your health care provider. This is important.  If your inhaler has a counter, you can check it to determine how full your inhaler is. If your inhaler does not have a counter, ask your health care provider when you will need to refill your inhaler and write the refill date on a calendar or on your inhaler canister. Note that you cannot know when an inhaler is empty by shaking it.  Follow directions on the package insert for care and cleaning of your inhaler and spacer. Contact a health care provider if:  Symptoms are only partially relieved with your inhaler.  You are having trouble using your inhaler.  You have an increase in phlegm.  You have headaches. Get help right away if:  You feel little or no relief after using your inhaler.  You have dizziness.  You have a fast heart rate.  You have chills or a fever.  You have night sweats.  There is blood in your phlegm. Summary  A metered dose inhaler is a handheld device for taking medicine that must be breathed into the lungs (inhaled).  The medicine is delivered by pushing down on a metal canister to release a preset amount of spray and medicine.  Each device contains the amount of medicine that is needed for a preset number of uses (inhalations). This  information is not intended to replace advice given to you by your health care provider. Make sure you discuss any questions you have with your health care provider. Document Released: 11/13/2005 Document Revised: 06/04/2017 Document Reviewed: 10/03/2016 Elsevier Interactive Patient Education  2019 [employer] Cough, Adult  Coughing is a reflex that clears your throat and your airways. Coughing helps to heal and protect your lungs. It is normal to cough occasionally, but a cough that happens with other symptoms or lasts a long time may be a sign of a condition that needs treatment. A cough may last only 2-3 weeks (acute), or it may last longer than 8 weeks (chronic). What are the causes? Coughing is commonly caused by:  Breathing in substances that irritate your lungs.  A viral or bacterial respiratory infection.  Allergies.  Asthma.  Postnasal drip.  Smoking.  Acid backing up from the stomach into the esophagus (gastroesophageal reflux).  Certain medicines.  Chronic lung problems, including COPD (or rarely, lung cancer).  Other medical conditions such as heart failure. Follow these instructions at home: Pay attention to any changes in your symptoms. Take these actions to help with your discomfort:  Take medicines only as told by your health care provider. ? If you were prescribed an antibiotic medicine, take it as told by your health care provider. Do not stop taking the antibiotic even if you start to feel better. ? Talk with your health care provider before you take a cough suppressant medicine.  Drink enough fluid to keep your urine clear or pale yellow.  If the air is dry, use a cold steam vaporizer or humidifier in your bedroom or your home to help loosen secretions.  Avoid anything that causes you to cough at work or at home.  If your cough is worse at night, try sleeping in a semi-upright position.  Avoid cigarette smoke. If you smoke, quit smoking. If you need  help quitting, ask your health care provider.  Avoid caffeine.  Avoid alcohol.  Rest as needed. Contact a health care provider if:  You have new symptoms.  You cough up pus.  Your cough does not get better after 2-3 weeks, or your cough gets worse.  You cannot control your cough with suppressant medicines and you are losing sleep.  You develop pain that is getting worse or pain that is not controlled with pain medicines.  You have a fever.  You have unexplained weight loss.  You have night sweats. Get help right away if:  You cough up blood.  You have difficulty breathing.  Your heartbeat is very fast. This information is not intended to replace advice given to you by your health care provider. Make sure you discuss any questions you have with your health care provider. Document Released: 05/12/2011 Document Revised: 04/20/2016 Document Reviewed: 01/20/2015 Elsevier Interactive Patient Education  2019 [employer] Sinusitis, Adult Sinusitis is inflammation of your sinuses. Sinuses are hollow spaces in the bones around your face. Your sinuses are located:  Around your eyes.  In the middle of your forehead.  Behind your nose.  In your cheekbones. Mucus normally drains out of your sinuses. When your nasal tissues become inflamed or swollen, mucus can become trapped or blocked. This allows bacteria, viruses, and fungi to grow, which leads to infection. Most infections of the sinuses are caused by a virus. Sinusitis can develop quickly. It can last for up to 4 weeks (acute) or for more than 12 weeks (chronic). Sinusitis often develops after a cold. What are the causes? This condition is caused by anything that creates swelling in the sinuses or stops mucus from draining. This includes:  Allergies.  Asthma.  Infection from bacteria or viruses.  Deformities or blockages in your nose or sinuses.  Abnormal growths in the nose (nasal polyps).  Pollutants, such as  chemicals or irritants in the air.  Infection from fungi (rare). What increases the risk? You are more likely to develop this condition if you:  Have a weak body defense system (immune system).  Do a lot of swimming or diving.  Overuse nasal sprays.  Smoke. What are the signs or symptoms? The main symptoms of this condition are pain and a feeling of pressure around the affected sinuses. Other symptoms include:  Stuffy nose or congestion.  Thick drainage from your nose.  Swelling and  warmth over the affected sinuses.  Headache.  Upper toothache.  A cough that may get worse at night.  Extra mucus that collects in the throat or the back of the nose (postnasal drip).  Decreased sense of smell and taste.  Fatigue.  A fever.  Sore throat.  Bad breath. How is this diagnosed? This condition is diagnosed based on:  Your symptoms.  Your medical history.  A physical exam.  Tests to find out if your condition is acute or chronic. This may include: ? Checking your nose for nasal polyps. ? Viewing your sinuses using a device that has a light (endoscope). ? Testing for allergies or bacteria. ? Imaging tests, such as an MRI or CT scan. In rare cases, a bone biopsy may be done to rule out more serious types of fungal sinus disease. How is this treated? Treatment for sinusitis depends on the cause and whether your condition is chronic or acute.  If caused by a virus, your symptoms should go away on their own within 10 days. You may be given medicines to relieve symptoms. They include: ? Medicines that shrink swollen nasal passages (topical intranasal decongestants). ? Medicines that treat allergies (antihistamines). ? A spray that eases inflammation of the nostrils (topical intranasal corticosteroids). ? Rinses that help get rid of thick mucus in your nose (nasal saline washes).  If caused by bacteria, your health care provider may recommend waiting to see if your  symptoms improve. Most bacterial infections will get better without antibiotic medicine. You may be given antibiotics if you have: ? A severe infection. ? A weak immune system.  If caused by narrow nasal passages or nasal polyps, you may need to have surgery. Follow these instructions at home: Medicines  Take, use, or apply over-the-counter and prescription medicines only as told by your health care provider. These may include nasal sprays.  If you were prescribed an antibiotic medicine, take it as told by your health care provider. Do not stop taking the antibiotic even if you start to feel better. Hydrate and humidify   Drink enough fluid to keep your urine pale yellow. Staying hydrated will help to thin your mucus.  Use a cool mist humidifier to keep the humidity level in your home above 50%.  Inhale steam for 10-15 minutes, 3-4 times a day, or as told by your health care provider. You can do this in the bathroom while a hot shower is running.  Limit your exposure to cool or dry air. Rest  Rest as much as possible.  Sleep with your head raised (elevated).  Make sure you get enough sleep each night. General instructions   Apply a warm, moist washcloth to your face 3-4 times a day or as told by your health care provider. This will help with discomfort.  Wash your hands often with soap and water to reduce your exposure to germs. If soap and water are not available, use hand sanitizer.  Do not smoke. Avoid being around people who are smoking (secondhand smoke).  Keep all follow-up visits as told by your health care provider. This is important. Contact a health care provider if:  You have a fever.  Your symptoms get worse.  Your symptoms do not improve within 10 days. Get help right away if:  You have a severe headache.  You have persistent vomiting.  You have severe pain or swelling around your face or eyes.  You have vision problems.  You develop  confusion.  Your neck  is stiff.  You have trouble breathing. Summary  Sinusitis is soreness and inflammation of your sinuses. Sinuses are hollow spaces in the bones around your face.  This condition is caused by nasal tissues that become inflamed or swollen. The swelling traps or blocks the flow of mucus. This allows bacteria, viruses, and fungi to grow, which leads to infection.  If you were prescribed an antibiotic medicine, take it as told by your health care provider. Do not stop taking the antibiotic even if you start to feel better.  Keep all follow-up visits as told by your health care provider. This is important. This information is not intended to replace advice given to you by your health care provider. Make sure you discuss any questions you have with your health care provider. Document Released: 11/13/2005 Document Revised: 04/15/2018 Document Reviewed: 04/15/2018 Elsevier Interactive Patient Education  2019 [employer]. How to Perform a Sinus Rinse A sinus rinse is a home treatment that is used to rinse your sinuses with a sterile mixture of salt and water (saline solution). Sinuses are air-filled spaces in your skull behind the bones of your face and forehead that open into your nasal cavity. A sinus rinse can help to clear mucus, dirt, dust, or pollen from your nasal cavity. You may do a sinus rinse when you have a cold, a virus, nasal allergy symptoms, a sinus infection, or stuffiness in your nose or sinuses. Talk with your health care provider about whether a sinus rinse might help you. What are the risks? A sinus rinse is generally safe and effective. However, there are a few risks, which include:  A burning sensation in your sinuses. This may happen if you do not make the saline solution as directed. Be sure to follow all directions when making the saline solution.  Nasal irritation.  Infection from contaminated water. This is rare, but possible. Do not do a sinus  rinse if you have had ear or nasal surgery, ear infection, or blocked ears. Supplies needed:  Saline solution or powder.  Distilled or sterile water may be needed to mix with saline powder. ? You may use boiled and cooled tap water. Boil tap water for 5 minutes; cool until it is lukewarm. Use within 24 hours. ? Do not use regular tap water to mix with the saline solution.  Neti pot or nasal rinse bottle. These supplies release the saline solution into your nose and through your sinuses. Neti pots and nasal rinse bottles can be purchased at [employer], a health food store, or online. How to perform a sinus rinse  14. Wash your hands with soap and water. 15. Wash your device according to the directions that came with the product and then dry it. 16. Use the solution that comes with your product or one that is sold separately in stores. Follow the mixing directions on the package if you need to mix with sterile or distilled water. 17. Fill the device with the amount of saline solution noted in the device instructions. 18. Stand over a sink and tilt your head sideways over the sink. 19. Place the spout of the device in your upper nostril (the one closer to the ceiling). 20. Gently pour or squeeze the saline solution into your nasal cavity. The liquid should drain out from the lower nostril if you are not too congested. 21. While rinsing, breathe through your open mouth. 22. Gently blow your nose to clear any mucus and rinse solution. Blowing too hard  may cause ear pain. 23. Repeat in your other nostril. 24. Clean and rinse your device with clean water and then air-dry it. Talk with your health care provider or pharmacist if you have questions about how to do a sinus rinse. Summary  A sinus rinse is a home treatment that is used to rinse your sinuses with a sterile mixture of salt and water (saline solution).  A sinus rinse is generally safe and effective. Follow all instructions  carefully.  Before doing a sinus rinse, talk with your health care provider about whether it would be helpful for you. This information is not intended to replace advice given to you by your health care provider. Make sure you discuss any questions you have with your health care provider. Document Released: 06/10/2014 Document Revised: 09/10/2017 Document Reviewed: 09/10/2017 Elsevier Interactive Patient Education  2019 [employer]  Eustachian Tube Dysfunction  Eustachian tube dysfunction refers to a condition in which a blockage develops in the narrow passage that connects the middle ear to the back of the nose (eustachian tube). The eustachian tube regulates air pressure in the middle ear by letting air move between the ear and nose. It also helps to drain fluid from the middle ear space. Eustachian tube dysfunction can affect one or both ears. When the eustachian tube does not function properly, air pressure, fluid, or both can build up in the middle ear. What are the causes? This condition occurs when the eustachian tube becomes blocked or cannot open normally. Common causes of this condition include:  Ear infections.  Colds and other infections that affect the nose, mouth, and throat (upper respiratory tract).  Allergies.  Irritation from cigarette smoke.  Irritation from stomach acid coming up into the esophagus (gastroesophageal reflux). The esophagus is the tube that carries food from the mouth to the stomach.  Sudden changes in air pressure, such as from descending in an airplane or scuba diving.  Abnormal growths in the nose or throat, such as: ? Growths that line the nose (nasal polyps). ? Abnormal growth of cells (tumors). ? Enlarged tissue at the back of the throat (adenoids). What increases the risk? You are more likely to develop this condition if:  You smoke.  You are overweight.  You are a child who has: ? Certain birth defects of the mouth, such as cleft  palate. ? Large tonsils or adenoids. What are the signs or symptoms? Common symptoms of this condition include:  A feeling of fullness in the ear.  Ear pain.  Clicking or popping noises in the ear.  Ringing in the ear.  Hearing loss.  Loss of balance.  Dizziness. Symptoms may get worse when the air pressure around you changes, such as when you travel to an area of high elevation, fly on an airplane, or go scuba diving. How is this diagnosed? This condition may be diagnosed based on:  Your symptoms.  A physical exam of your ears, nose, and throat.  Tests, such as those that measure: ? The movement of your eardrum (tympanogram). ? Your hearing (audiometry). How is this treated? Treatment depends on the cause and severity of your condition.  In mild cases, you may relieve your symptoms by moving air into your ears. This is called "popping the ears."  In more severe cases, or if you have symptoms of fluid in your ears, treatment may include: ? Medicines to relieve congestion (decongestants). ? Medicines that treat allergies (antihistamines). ? Nasal sprays or ear drops that contain medicines  that reduce swelling (steroids). ? A procedure to drain the fluid in your eardrum (myringotomy). In this procedure, a small tube is placed in the eardrum to:  Drain the fluid.  Restore the air in the middle ear space. ? A procedure to insert a balloon device through the nose to inflate the opening of the eustachian tube (balloon dilation). Follow these instructions at home: Lifestyle  Do not do any of the following until your health care provider approves: ? Travel to high altitudes. ? Fly in airplanes. ? Work in a Estate agent or room. ? Scuba dive.  Do not use any products that contain nicotine or tobacco, such as cigarettes and e-cigarettes. If you need help quitting, ask your health care provider.  Keep your ears dry. Wear fitted earplugs during showering and bathing.  Dry your ears completely after. General instructions  Take over-the-counter and prescription medicines only as told by your health care provider.  Use techniques to help pop your ears as recommended by your health care provider. These may include: ? Chewing gum. ? Yawning. ? Frequent, forceful swallowing. ? Closing your mouth, holding your nose closed, and gently blowing as if you are trying to blow air out of your nose.  Keep all follow-up visits as told by your health care provider. This is important. Contact a health care provider if:  Your symptoms do not go away after treatment.  Your symptoms come back after treatment.  You are unable to pop your ears.  You have: ? A fever. ? Pain in your ear. ? Pain in your head or neck. ? Fluid draining from your ear.  Your hearing suddenly changes.  You become very dizzy.  You lose your balance. Summary  Eustachian tube dysfunction refers to a condition in which a blockage develops in the eustachian tube.  It can be caused by ear infections, allergies, inhaled irritants, or abnormal growths in the nose or throat.  Symptoms include ear pain, hearing loss, or ringing in the ears.  Mild cases are treated with maneuvers to unblock the ears, such as yawning or ear popping.  Severe cases are treated with medicines. Surgery may also be done (rare). This information is not intended to replace advice given to you by your health care provider. Make sure you discuss any questions you have with your health care provider. Document Released: 12/10/2015 Document Revised: 03/05/2018 Document Reviewed: 03/05/2018 Elsevier Interactive Patient Education  2019 [employer].

## 2018-12-12 NOTE — Progress Notes (Signed)
Subjective:    Patient ID: Stephanie Chandler, female    DOB: 1966-08-18, 53 y.o.   MRN: [id number]  52y/o African American female presenting for f/u from previous visit 12/05/2018 States she has went from feeling 10% normal to about 60% today. States cough and congestion have continued. Phlegm thick, hard to cough up yellow/green. PND causing sore throat but is clear. Intermittent ear pain bilateral At home taking Phenylephrine, Augmentin, Flonase. Has not used albuterol inhaler yet. Tessalon pearles have helped in the past has run out would like refill.  + sick contacts at work still also URI     Review of Systems  Constitutional: Positive for fatigue. Negative for activity change, appetite change, chills, diaphoresis, fever and unexpected weight change.  HENT: Positive for congestion, ear pain, postnasal drip, rhinorrhea, sinus pressure, sinus pain and sore throat. Negative for dental problem, drooling, ear discharge, facial swelling, hearing loss, mouth sores, nosebleeds, sneezing, tinnitus, trouble swallowing and voice change.   Eyes: Negative for photophobia, pain, discharge, redness, itching and visual disturbance.  Respiratory: Positive for cough. Negative for choking, chest tightness, shortness of breath, wheezing and stridor.   Cardiovascular: Negative for chest pain, palpitations and leg swelling.  Gastrointestinal: Negative for abdominal distention, abdominal pain, diarrhea, nausea and vomiting.  Endocrine: Negative for cold intolerance and heat intolerance.  Genitourinary: Negative for difficulty urinating and dysuria.  Musculoskeletal: Negative for arthralgias, back pain, gait problem, joint swelling, myalgias, neck pain and neck stiffness.  Skin: Negative for color change, pallor, rash and wound.  Allergic/Immunologic: Positive for environmental allergies. Negative for food allergies.  Neurological: Negative for dizziness, tremors, seizures, syncope, facial asymmetry, speech  difficulty, weakness, light-headedness, numbness and headaches.  Hematological: Negative for adenopathy. Does not bruise/bleed easily.  Psychiatric/Behavioral: Negative for agitation, behavioral problems, confusion and sleep disturbance.       Objective:   Physical Exam Vitals signs and nursing note reviewed.  Constitutional:      General: She is not in acute distress.    Appearance: Normal appearance. She is well-developed and well-groomed. She is obese. She is not ill-appearing, toxic-appearing or diaphoretic.  HENT:     Head: Normocephalic and atraumatic.     Jaw: There is normal jaw occlusion. No trismus.     Salivary Glands: Right salivary gland is not diffusely enlarged or tender. Left salivary gland is not diffusely enlarged or tender.     Right Ear: Hearing, ear canal and external ear normal. A middle ear effusion is present. There is no impacted cerumen.     Left Ear: Hearing, ear canal and external ear normal. A middle ear effusion is present. There is no impacted cerumen.     Nose: Mucosal edema and rhinorrhea present. No nasal deformity, septal deviation, laceration or congestion.     Right Turbinates: Enlarged and swollen.     Left Turbinates: Enlarged and swollen.     Right Sinus: Maxillary sinus tenderness present. No frontal sinus tenderness.     Left Sinus: Maxillary sinus tenderness present. No frontal sinus tenderness.     Mouth/Throat:     Lips: Pink. No lesions.     Mouth: Mucous membranes are moist. Mucous membranes are not pale, not dry and not cyanotic. No lacerations or oral lesions.     Dentition: Normal dentition. Does not have dentures. No dental caries or dental abscesses.     Tongue: No lesions.     Pharynx: Uvula midline. Pharyngeal swelling and posterior oropharyngeal erythema present. No oropharyngeal exudate or  uvula swelling.     Tonsils: No tonsillar exudate or tonsillar abscesses. Swelling: 0 on the right. 0 on the left.     Comments: Cobblestoning  posterior pharynx; bilateral TMs air fluid level clear; bilateral allergic shiners; nasal turbinates edema/erythema clear discharge Eyes:     General: Lids are normal. Allergic shiner present. No scleral icterus.       Right eye: No foreign body, discharge or hordeolum.        Left eye: No foreign body, discharge or hordeolum.     Extraocular Movements:     Right eye: Normal extraocular motion and no nystagmus.     Left eye: Normal extraocular motion and no nystagmus.     Conjunctiva/sclera: Conjunctivae normal.     Right eye: Right conjunctiva is not injected. No chemosis, exudate or hemorrhage.    Left eye: Left conjunctiva is not injected. No chemosis, exudate or hemorrhage.    Pupils: Pupils are equal, round, and reactive to light. Pupils are equal.     Right eye: Pupil is round and reactive.     Left eye: Pupil is round and reactive.  Neck:     Musculoskeletal: Normal range of motion and neck supple. Normal range of motion. No edema, erythema, neck rigidity, spinous process tenderness or muscular tenderness.     Thyroid: No thyroid mass or thyromegaly.     Trachea: Trachea and phonation normal. No tracheal tenderness or tracheal deviation.  Cardiovascular:     Rate and Rhythm: Normal rate and regular rhythm.     Chest Wall: PMI is not displaced.     Heart sounds: Normal heart sounds, S1 normal and S2 normal. No murmur. No friction rub. No gallop.   Pulmonary:     Effort: Pulmonary effort is normal. No accessory muscle usage or respiratory distress.     Breath sounds: Normal breath sounds and air entry. No stridor, decreased air movement or transmitted upper airway sounds. No decreased breath sounds, wheezing, rhonchi or rales.     Comments: Rare nonproductive cough in exam room; spoke full sentences without difficulty Chest:     Chest wall: No tenderness.  Abdominal:     General: There is no distension.     Palpations: Abdomen is soft.  Musculoskeletal: Normal range of motion.         General: No tenderness.     Right shoulder: Normal.     Left shoulder: Normal.     Right elbow: Normal.    Left elbow: Normal.     Right hip: Normal.     Left hip: Normal.     Right knee: Normal.     Left knee: Normal.     Cervical back: Normal.     Thoracic back: Normal.     Lumbar back: Normal.     Right hand: Normal.     Left hand: Normal.     Right lower leg: No edema.     Left lower leg: No edema.  Lymphadenopathy:     Head:     Right side of head: No submental, submandibular, tonsillar, preauricular, posterior auricular or occipital adenopathy.     Left side of head: No submental, submandibular, tonsillar, preauricular, posterior auricular or occipital adenopathy.     Cervical: No cervical adenopathy.     Right cervical: No superficial, deep or posterior cervical adenopathy.    Left cervical: No superficial, deep or posterior cervical adenopathy.  Skin:    General: Skin is warm and dry.  Capillary Refill: Capillary refill takes less than 2 seconds.     Coloration: Skin is not jaundiced or pale.     Findings: No abrasion, bruising, burn, ecchymosis, erythema, laceration, lesion, petechiae or rash.     Nails: There is no clubbing.   Neurological:     General: No focal deficit present.     Mental Status: She is alert and oriented to person, place, and time. Mental status is at baseline. She is not disoriented.     GCS: GCS eye subscore is 4. GCS verbal subscore is 5. GCS motor subscore is 6.     Cranial Nerves: No cranial nerve deficit.     Sensory: No sensory deficit.     Motor: No weakness, tremor, atrophy, abnormal muscle tone or seizure activity.     Coordination: Coordination normal.     Gait: Gait normal.  Psychiatric:        Mood and Affect: Mood normal.        Speech: Speech normal.        Behavior: Behavior normal. Behavior is cooperative.        Thought Content: Thought content normal.        Judgment: Judgment normal.       Minor maxillary TTP,  rare cough    Assessment & Plan:  A-acute recurrent maxillary sinusitis, cough, eustachian tube dysfunction bilaterally  P-Wanted refill on albuterol hasn't been using but thinks hers at home low, currently using flonase 1 spray once a day discussed may use twice a day 1 spray; electronic Rx tessalon pearles 200mg  po TID prn cough #30 RF0 to her pharmacy of choice.  Cough drops po q2h prn cough 6 UD given to patient from clinic stock.  discussed viral mucous goes clear to yellow to green mucous 14 days typically duration typically seen in community at this time.  Have only started treatment Tuesday and augmentin covers for pneumonia also, maxillary/sinus pressure decreased almost resolved and TM erythema resolved.  saline 2 sprays each nostril q2h wa prn congestion.    Denied personal or family history of ENT cancer.  Shower BID especially prior to bed. No evidence of systemic bacterial infection, non toxic and well hydrated.  I do not see where any further testing or imaging is necessary at this time.   I will suggest supportive care, rest, good hygiene and encourage the patient to take adequate fluids.  The patient is to return to clinic or EMERGENCY ROOM if symptoms worsen or change significantly.  Exitcare handout on cough, sinusitis and sinus rinse.  Patient verbalized agreement and understanding of treatment plan and had no further questions at this time.   P2:  Hand washing and cover cough  Supportive treatment.   No evidence of invasive bacterial infection, non toxic and well hydrated.  This is most likely self limiting viral infection.  I do not see where any further testing or imaging is necessary at this time.   I will suggest supportive care, rest, good hygiene and encourage the patient to take adequate fluids.  The patient is to return to clinic or EMERGENCY ROOM if symptoms worsen or change significantly e.g. ear pain, fever, purulent discharge from ears or bleeding.  Exitcare handout on  eustachian tube dysfunction.  Patient verbalized agreement and understanding of treatment plan.

## 2019-01-09 ENCOUNTER — Ambulatory Visit: Payer: Self-pay | Admitting: Registered Nurse

## 2019-01-09 ENCOUNTER — Encounter: Payer: Self-pay | Admitting: Registered Nurse

## 2019-01-09 VITALS — BP 106/86 | HR 103 | Temp 101.1°F

## 2019-01-09 DIAGNOSIS — B9789 Other viral agents as the cause of diseases classified elsewhere: Principal | ICD-10-CM

## 2019-01-09 DIAGNOSIS — J069 Acute upper respiratory infection, unspecified: Secondary | ICD-10-CM

## 2019-01-09 DIAGNOSIS — H6593 Unspecified nonsuppurative otitis media, bilateral: Secondary | ICD-10-CM

## 2019-01-09 MED ORDER — ACETAMINOPHEN 500 MG PO TABS: 1000.0000 mg | ORAL_TABLET | Freq: Four times a day (QID) | ORAL | 0 refills | Status: AC | PRN

## 2019-01-09 MED ORDER — PREDNISONE 10 MG PO TABS: 20.0000 mg | ORAL_TABLET | Freq: Every day | ORAL | 0 refills | Status: AC

## 2019-01-09 MED ORDER — AMOXICILLIN-POT CLAVULANATE 875-125 MG PO TABS: 1.0000 | ORAL_TABLET | Freq: Two times a day (BID) | ORAL | 0 refills | Status: AC

## 2019-01-09 MED ORDER — OSELTAMIVIR PHOSPHATE 75 MG PO CAPS: 75.0000 mg | ORAL_CAPSULE | Freq: Two times a day (BID) | ORAL | 0 refills | Status: AC

## 2019-01-09 MED ORDER — BENZONATATE 200 MG PO CAPS: 200.0000 mg | ORAL_CAPSULE | Freq: Three times a day (TID) | ORAL | 0 refills | Status: AC | PRN

## 2019-01-09 MED ORDER — FLUCONAZOLE 150 MG PO TABS: 150.0000 mg | ORAL_TABLET | Freq: Once | ORAL | 0 refills | Status: AC

## 2019-01-09 MED ORDER — BENZONATATE 200 MG PO CAPS: 200.0000 mg | ORAL_CAPSULE | Freq: Two times a day (BID) | ORAL | 0 refills | Status: DC | PRN

## 2019-01-09 NOTE — Progress Notes (Signed)
Subjective:    Patient ID: Stephanie Chandler, female    DOB: 06/11/1966, 53 y.o.   MRN: [id number]  52y/o African-American established female pt c/o sudden onset sx yesterday of nonproductive cough, chest congestion, sore throat, rhinorrhea, nasal congestion, maxillary sinus pressure/pain with upper dental pain. Progressive sx included fatigue, generalized body aches, chills and HA. Using Sudafed, black elderberry, albuterol inhaler, flonase, tessalon pearles at home. Cough persists despite meds. Had felt better after augmentin in January but symptoms restarted suddenly yesterday hadn't restarted her nasal saline yet will do so today.  Would like refill on tessalon pearles. + sick contacts at work viral URIs including flu PMHs seasonal allergies     Review of Systems  Constitutional: Positive for chills, fatigue and fever. Negative for activity change, appetite change, diaphoresis and unexpected weight change.  HENT: Positive for congestion, ear pain, postnasal drip, rhinorrhea, sinus pressure, sinus pain and sore throat. Negative for dental problem, drooling, ear discharge, facial swelling, hearing loss, mouth sores, nosebleeds, sneezing, tinnitus, trouble swallowing and voice change.   Eyes: Negative for photophobia, pain, discharge, redness, itching and visual disturbance.  Respiratory: Positive for cough. Negative for choking, chest tightness, shortness of breath, wheezing and stridor.   Cardiovascular: Negative for chest pain, palpitations and leg swelling.  Gastrointestinal: Negative for abdominal distention, abdominal pain, blood in stool, diarrhea, nausea and vomiting.  Endocrine: Negative for cold intolerance and heat intolerance.  Genitourinary: Negative for difficulty urinating.  Musculoskeletal: Positive for myalgias. Negative for arthralgias, back pain, gait problem, joint swelling, neck pain and neck stiffness.  Skin: Negative for color change, pallor, rash and wound.   Allergic/Immunologic: Positive for environmental allergies. Negative for food allergies.  Neurological: Positive for headaches. Negative for dizziness, tremors, seizures, syncope, facial asymmetry, speech difficulty, weakness, light-headedness and numbness.  Hematological: Negative for adenopathy. Does not bruise/bleed easily.  Psychiatric/Behavioral: Negative for agitation, behavioral problems, confusion and sleep disturbance.       Objective:   Physical Exam Vitals signs and nursing note reviewed.  Constitutional:      General: She is not in acute distress.    Appearance: Normal appearance. She is well-developed and well-groomed. She is obese. She is ill-appearing. She is not toxic-appearing or diaphoretic.  HENT:     Head: Normocephalic and atraumatic.     Jaw: There is normal jaw occlusion. No trismus.     Salivary Glands: Right salivary gland is not diffusely enlarged or tender. Left salivary gland is not diffusely enlarged or tender.     Right Ear: Hearing, ear canal and external ear normal. A middle ear effusion is present. There is no impacted cerumen. Tympanic membrane is erythematous and bulging.     Left Ear: Hearing, ear canal and external ear normal. A middle ear effusion is present. There is no impacted cerumen. Tympanic membrane is erythematous and bulging.     Nose: Mucosal edema, congestion and rhinorrhea present. No nasal deformity, septal deviation or laceration.     Right Turbinates: Enlarged and swollen.     Left Turbinates: Enlarged and swollen. Not pale.     Right Sinus: Frontal sinus tenderness present. No maxillary sinus tenderness.     Left Sinus: Frontal sinus tenderness present. No maxillary sinus tenderness.     Mouth/Throat:     Lips: Pink. No lesions.     Mouth: Mucous membranes are not pale, dry and not cyanotic. No lacerations, oral lesions or angioedema.     Dentition: Normal dentition. Does not have dentures. No  dental caries or dental abscesses.      Tongue: No lesions.     Pharynx: Uvula midline. Pharyngeal swelling and posterior oropharyngeal erythema present. No oropharyngeal exudate or uvula swelling.     Tonsils: No tonsillar exudate or tonsillar abscesses. Swelling: 0 on the right. 0 on the left.     Comments: Tacky tongue; bilateral TMs erythema faint throughout both air fluid level clear bulging; cobblestoning posterior pharynx; bilateral nasal turbinates edema/erythema clear discharge; frequent nose clearing Eyes:     General: Lids are normal. Allergic shiner present. No visual field deficit or scleral icterus.       Right eye: No foreign body, discharge or hordeolum.        Left eye: No foreign body, discharge or hordeolum.     Extraocular Movements: Extraocular movements intact.     Right eye: Normal extraocular motion and no nystagmus.     Left eye: Normal extraocular motion and no nystagmus.     Conjunctiva/sclera: Conjunctivae normal.     Right eye: Right conjunctiva is not injected. No chemosis, exudate or hemorrhage.    Left eye: Left conjunctiva is not injected. No chemosis, exudate or hemorrhage.    Pupils: Pupils are equal, round, and reactive to light. Pupils are equal.     Right eye: Pupil is round and reactive.     Left eye: Pupil is round and reactive.  Neck:     Musculoskeletal: Normal range of motion and neck supple. Normal range of motion. No edema, erythema, neck rigidity, spinous process tenderness or muscular tenderness.     Thyroid: No thyroid mass or thyromegaly.     Trachea: Trachea and phonation normal. No tracheal tenderness or tracheal deviation.  Cardiovascular:     Rate and Rhythm: Normal rate and regular rhythm.     Chest Wall: PMI is not displaced.     Heart sounds: Normal heart sounds, S1 normal and S2 normal. No murmur. No friction rub. No gallop.   Pulmonary:     Effort: Pulmonary effort is normal. No accessory muscle usage or respiratory distress.     Breath sounds: Normal breath sounds and  air entry. No stridor, decreased air movement or transmitted upper airway sounds. No decreased breath sounds, wheezing, rhonchi or rales.     Comments: Frequent mild nonproductive cough in exam room; spoke full sentences without difficulty while wearing mask Chest:     Chest wall: No tenderness.  Abdominal:     General: There is no distension.     Palpations: Abdomen is soft.  Musculoskeletal: Normal range of motion.        General: No swelling or tenderness.     Right shoulder: Normal.     Left shoulder: Normal.     Right elbow: Normal.    Left elbow: Normal.     Right hip: Normal.     Left hip: Normal.     Right knee: Normal.     Left knee: Normal.     Cervical back: Normal.     Thoracic back: Normal.     Lumbar back: Normal.     Right hand: Normal.     Left hand: Normal.     Right lower leg: No edema.     Left lower leg: No edema.  Lymphadenopathy:     Head:     Right side of head: No submental, submandibular, tonsillar, preauricular, posterior auricular or occipital adenopathy.     Left side of head: No submental, submandibular, tonsillar, preauricular,  posterior auricular or occipital adenopathy.     Cervical: No cervical adenopathy.     Right cervical: No superficial, deep or posterior cervical adenopathy.    Left cervical: No superficial, deep or posterior cervical adenopathy.  Skin:    General: Skin is warm and dry.     Capillary Refill: Capillary refill takes less than 2 seconds.     Coloration: Skin is not ashen, cyanotic, jaundiced, mottled, pale or sallow.     Findings: No abrasion, abscess, acne, bruising, burn, ecchymosis, erythema, laceration, lesion, petechiae or rash.     Nails: There is no clubbing.   Neurological:     General: No focal deficit present.     Mental Status: She is alert and oriented to person, place, and time. She is not disoriented.     GCS: GCS eye subscore is 4. GCS verbal subscore is 5. GCS motor subscore is 6.     Cranial Nerves: Cranial  nerves are intact. No cranial nerve deficit, dysarthria or facial asymmetry.     Sensory: Sensation is intact. No sensory deficit.     Motor: Motor function is intact. No weakness, tremor, atrophy, abnormal muscle tone or seizure activity.     Coordination: Coordination is intact. Coordination normal.     Gait: Gait is intact. Gait normal.  Psychiatric:        Attention and Perception: Attention and perception normal.        Mood and Affect: Mood and affect normal.        Speech: Speech normal.        Behavior: Behavior normal. Behavior is cooperative.        Thought Content: Thought content normal.        Cognition and Memory: Cognition and memory normal.        Judgment: Judgment normal.           Assessment & Plan:  A-viral URI with cough and bilateral otitis media with effusion  P-Supportive treatment. Augmentin 875mg  po BID x 10 days #20 RF0 dispensed from [facility] to patient  Tylenol 1000mg  po QID prn pain/fever.   No evidence of invasive bacterial infection, non toxic and well hydrated.  This is most likely self limiting viral infection.  I do not see where any further testing or imaging is necessary at this time.   I will suggest supportive care, rest, good hygiene and encourage the patient to take adequate fluids.  The patient is to return to clinic or EMERGENCY ROOM if symptoms worsen or change significantly e.g. ear pain, fever, purulent discharge from ears or bleeding.  Exitcare handout on otitis media printed and given to patient.  Patient verbalized agreement and understanding of treatment plan.    Discussed rapid flu testing not available in this clinic  Due to fever, runny nose, cough, sore throat, myalgias and patient preference will treat with tamiflu.  Restart nasal saline; continue flonase 1 spray each nostril BID, saline 2 sprays each nostril q2h wa prn congestion.  If no improvement with 48 hours of saline and flonase use start augmentin 875mg  po BID x 10 days #20 RF0  dispensed from [facility] to patient  Electronic Rx to her pharmacy of choice tamiflu 75mg  po BID x 5 days #10 RF0, tessalon pearles 200mg  po TID prn cough #60 RF0, diflucan 150mg  po x 1 onset vaginal yeast symptoms that typically occur after oral antibiotic use #1 RF0.  Denied personal or family history of ENT cancer.  Shower BID especially prior  to bed. No evidence of systemic bacterial infection, non toxic and well hydrated.  I do not see where any further testing or imaging is necessary at this time.   I will suggest supportive care, rest, good hygiene and encourage the patient to take adequate fluids.  The patient is to return to clinic or EMERGENCY ROOM if symptoms worsen or change significantly.  Exitcare handout on sinusitis and sinus rinse, influenza, viral URI printed and given to patient.  Patient verbalized agreement and understanding of treatment plan and had no further questions at this time.   P2:  Hand washing and cover cough  Electronic Rx tessalon pearles 200mg  po TID prn cough #60 RF0  Cough lozenges po q2h prn cough given 8 UD from clinic stock.  Prednisone 10mg  sig t2 po daily with breakfast #21 RF0 dispensed from [facility].  Discussed possible side effects increased/decreased appetite, difficulty sleeping, increased blood sugar, increased blood pressure and heart rate.  Albuterol MDI 1-2 puffs po q4-6h prn protracted cough/wheeze at home side effect increased heart rate. Bronchitis simple, community acquired, may have started as viral (probably respiratory syncytial, parainfluenza, influenza, or adenovirus), but now evidence of acute purulent bronchitis with resultant bronchial edema and mucus formation.  Viruses are the most common cause of bronchial inflammation in otherwise healthy adults with acute bronchitis.  The appearance of sputum is not predictive of whether a bacterial infection is present.  Purulent sputum is most often caused by viral infections.  There are a small portion of those  caused by non-viral agents being Mycoplama pneumonia.  Microscopic examination or C&S of sputum in the healthy adult with acute bronchitis is generally not helpful (usually negative or normal respiratory flora) other considerations being cough from upper respiratory tract infections, sinusitis or allergic syndromes (mild asthma or viral pneumonia).  Differential Diagnoses:  reactive airway disease (asthma, allergic aspergillosis (eosinophilia), chronic bronchitis, respiratory infection (sinusitis, common cold, pneumonia), congestive heart failure, reflux esophagitis, bronchogenic tumor, aspiration syndromes and/or exposure to pulmonary irritants/smoke.    Without high fever, severe dyspnea, lack of physical findings or other risk factors, I will hold on a chest radiograph and CBC at this time.  I discussed that approximately 50% of patients with acute bronchitis have a cough that lasts up to three weeks, and 25% for over a month.  Tylenol 500mg  one to two tablets every four to six hours as needed for fever or myalgias.  No aspirin. Exitcare handout on inhaler use given to patient.  ER if hemopthysis, SOB, worst chest pain of life.   Patient instructed to follow up in one week or sooner if symptoms worsen.  Patient verbalized agreement and understanding of treatment plan.  P2:  hand washing and cover cough

## 2019-01-09 NOTE — Patient Instructions (Signed)
Otitis Media, Adult  Otitis media occurs when there is inflammation and fluid in the middle ear. Your middle ear is a part of the ear that contains bones for hearing as well as air that helps send sounds to your brain. What are the causes? This condition is caused by a blockage in the eustachian tube. This tube drains fluid from the ear to the back of the nose (nasopharynx). A blockage in this tube can be caused by an object or by swelling (edema) in the tube. Problems that can cause a blockage include:  A cold or other upper respiratory infection.  Allergies.  An irritant, such as tobacco smoke.  Enlarged adenoids. The adenoids are areas of soft tissue located high in the back of the throat, behind the nose and the roof of the mouth.  A mass in the nasopharynx.  Damage to the ear caused by pressure changes (barotrauma). What are the signs or symptoms? Symptoms of this condition include:  Ear pain.  A fever.  Decreased hearing.  A headache.  Tiredness (lethargy).  Fluid leaking from the ear.  Ringing in the ear. How is this diagnosed? This condition is diagnosed with a physical exam. During the exam your health care provider will use an instrument called an otoscope to look into your ear and check for redness, swelling, and fluid. He or she will also ask about your symptoms. Your health care provider may also order tests, such as:  A test to check the movement of the eardrum (pneumatic otoscopy). This test is done by squeezing a small amount of air into the ear.  A test that changes air pressure in the middle ear to check how well the eardrum moves and whether the eustachian tube is working (tympanogram). How is this treated? This condition usually goes away on its own within 3-5 days. But if the condition is caused by a bacteria infection and does not go away own its own, or keeps coming back, your health care provider may:  Prescribe antibiotic medicines to treat the  infection.  Prescribe or recommend medicines to control pain. Follow these instructions at home:  Take over-the-counter and prescription medicines only as told by your health care provider.  If you were prescribed an antibiotic medicine, take it as told by your health care provider. Do not stop taking the antibiotic even if you start to feel better.  Keep all follow-up visits as told by your health care provider. This is important. Contact a health care provider if:  You have bleeding from your nose.  There is a lump on your neck.  You are not getting better in 5 days.  You feel worse instead of better. Get help right away if:  You have severe pain that is not controlled with medicine.  You have swelling, redness, or pain around your ear.  You have stiffness in your neck.  A part of your face is paralyzed.  The bone behind your ear (mastoid) is tender when you touch it.  You develop a severe headache. Summary  Otitis media is redness, soreness, and swelling of the middle ear.  This condition usually goes away on its own within 3-5 days.  If the problem does not go away in 3-5 days, your health care provider may prescribe or recommend medicines to treat your symptoms.  If you were prescribed an antibiotic medicine, take it as told by your health care provider. This information is not intended to replace advice given to you by   your health care provider. Make sure you discuss any questions you have with your health care provider. Document Released: 08/18/2004 Document Revised: 11/03/2016 Document Reviewed: 11/03/2016 Elsevier Interactive Patient Education  2019 [employer]. Influenza, Adult Influenza, more commonly known as "the flu," is a viral infection that mainly affects the respiratory tract. The respiratory tract includes organs that help you breathe, such as the lungs, nose, and throat. The flu causes many symptoms similar to the common cold along with high fever  and body aches. The flu spreads easily from person to person (is contagious). Getting a flu shot (influenza vaccination) every year is the best way to prevent the flu. What are the causes? This condition is caused by the influenza virus. You can get the virus by:  Breathing in droplets that are in the air from an infected person's cough or sneeze.  Touching something that has been exposed to the virus (has been contaminated) and then touching your mouth, nose, or eyes. What increases the risk? The following factors may make you more likely to get the flu:  Not washing or sanitizing your hands often.  Having close contact with many people during cold and flu season.  Touching your mouth, eyes, or nose without first washing or sanitizing your hands.  Not getting a yearly (annual) flu shot. You may have a higher risk for the flu, including serious problems such as a lung infection (pneumonia), if you:  Are older than 65.  Are pregnant.  Have a weakened disease-fighting system (immune system). You may have a weakened immune system if you: ? Have HIV or AIDS. ? Are undergoing chemotherapy. ? Are taking medicines that reduce (suppress) the activity of your immune system.  Have a long-term (chronic) illness, such as heart disease, kidney disease, diabetes, or lung disease.  Have a liver disorder.  Are severely overweight (morbidly obese).  Have anemia. This is a condition that affects your red blood cells.  Have asthma. What are the signs or symptoms? Symptoms of this condition usually begin suddenly and last 4-14 days. They may include:  Fever and chills.  Headaches, body aches, or muscle aches.  Sore throat.  Cough.  Runny or stuffy (congested) nose.  Chest discomfort.  Poor appetite.  Weakness or fatigue.  Dizziness.  Nausea or vomiting. How is this diagnosed? This condition may be diagnosed based on:  Your symptoms and medical history.  A physical  exam.  Swabbing your nose or throat and testing the fluid for the influenza virus. How is this treated? If the flu is diagnosed early, you can be treated with medicine that can help reduce how severe the illness is and how long it lasts (antiviral medicine). This may be given by mouth (orally) or through an IV. Taking care of yourself at home can help relieve symptoms. Your health care provider may recommend:  Taking over-the-counter medicines.  Drinking plenty of fluids. In many cases, the flu goes away on its own. If you have severe symptoms or complications, you may be treated in a hospital. Follow these instructions at home: Activity  Rest as needed and get plenty of sleep.  Stay home from work or school as told by your health care provider. Unless you are visiting your health care provider, avoid leaving home until your fever has been gone for 24 hours without taking medicine. Eating and drinking  Take an oral rehydration solution (ORS). This is a drink that is sold at pharmacies and retail stores.  Drink enough fluid  to keep your urine pale yellow.  Drink clear fluids in small amounts as you are able. Clear fluids include water, ice chips, diluted fruit juice, and low-calorie sports drinks.  Eat bland, easy-to-digest foods in small amounts as you are able. These foods include bananas, applesauce, rice, lean meats, toast, and crackers.  Avoid drinking fluids that contain a lot of sugar or caffeine, such as energy drinks, regular sports drinks, and soda.  Avoid alcohol.  Avoid spicy or fatty foods. General instructions      Take over-the-counter and prescription medicines only as told by your health care provider.  Use a cool mist humidifier to add humidity to the air in your home. This can make it easier to breathe.  Cover your mouth and nose when you cough or sneeze.  Wash your hands with soap and water often, especially after you cough or sneeze. If soap and water  are not available, use alcohol-based hand sanitizer.  Keep all follow-up visits as told by your health care provider. This is important. How is this prevented?   Get an annual flu shot. You may get the flu shot in late summer, fall, or winter. Ask your health care provider when you should get your flu shot.  Avoid contact with people who are sick during cold and flu season. This is generally fall and winter. Contact a health care provider if:  You develop new symptoms.  You have: ? Chest pain. ? Diarrhea. ? A fever.  Your cough gets worse.  You produce more mucus.  You feel nauseous or you vomit. Get help right away if:  You develop shortness of breath or difficulty breathing.  Your skin or nails turn a bluish color.  You have severe pain or stiffness in your neck.  You develop a sudden headache or sudden pain in your face or ear.  You cannot eat or drink without vomiting. Summary  Influenza, more commonly known as "the flu," is a viral infection that primarily affects your respiratory tract.  Symptoms of the flu usually begin suddenly and last 4-14 days.  Getting an annual flu shot is the best way to prevent getting the flu.  Stay home from work or school as told by your health care provider. Unless you are visiting your health care provider, avoid leaving home until your fever has been gone for 24 hours without taking medicine.  Keep all follow-up visits as told by your health care provider. This is important. This information is not intended to replace advice given to you by your health care provider. Make sure you discuss any questions you have with your health care provider. Document Released: 11/10/2000 Document Revised: 05/01/2018 Document Reviewed: 05/01/2018 Elsevier Interactive Patient Education  2019 [employer] Viral Respiratory Infection A respiratory infection is an illness that affects part of the respiratory system, such as the lungs, nose, or  throat. A respiratory infection that is caused by a virus is called a viral respiratory infection. Common types of viral respiratory infections include:  A cold.  The flu (influenza).  A respiratory syncytial virus (RSV) infection. What are the causes? This condition is caused by a virus. What are the signs or symptoms? Symptoms of this condition include:  A stuffy or runny nose.  Yellow or green nasal discharge.  A cough.  Sneezing.  Fatigue.  Achy muscles.  A sore throat.  Sweating or chills.  A fever.  A headache. How is this diagnosed? This condition may be diagnosed based on:  Your symptoms.  A physical exam.  Testing of nasal swabs. How is this treated? This condition may be treated with medicines, such as:  Antiviral medicine. This may shorten the length of time a person has symptoms.  Expectorants. These make it easier to cough up mucus.  Decongestant nasal sprays.  Acetaminophen or NSAIDs to relieve fever and pain. Antibiotic medicines are not prescribed for viral infections. This is because antibiotics are designed to kill bacteria. They are not effective against viruses. Follow these instructions at home:  Managing pain and congestion  Take over-the-counter and prescription medicines only as told by your health care provider.  If you have a sore throat, gargle with a salt-water mixture 3-4 times a day or as needed. To make a salt-water mixture, completely dissolve -1 tsp of salt in 1 cup of warm water.  Use nose drops made from salt water to ease congestion and soften raw skin around your nose.  Drink enough fluid to keep your urine pale yellow. This helps prevent dehydration and helps loosen up mucus. General instructions  Rest as much as possible.  Do not drink alcohol.  Do not use any products that contain nicotine or tobacco, such as cigarettes and e-cigarettes. If you need help quitting, ask your health care provider.  Keep all  follow-up visits as told by your health care provider. This is important. How is this prevented?   Get an annual flu shot. You may get the flu shot in late summer, fall, or winter. Ask your health care provider when you should get your flu shot.  Avoid exposing others to your respiratory infection. ? Stay home from work or school as told by your health care provider. ? Wash your hands with soap and water often, especially after you cough or sneeze. If soap and water are not available, use alcohol-based hand sanitizer.  Avoid contact with people who are sick during cold and flu season. This is generally fall and winter. Contact a health care provider if:  Your symptoms last for 10 days or longer.  Your symptoms get worse over time.  You have a fever.  You have severe sinus pain in your face or forehead.  The glands in your jaw or neck become very swollen. Get help right away if you:  Feel pain or pressure in your chest.  Have shortness of breath.  Faint or feel like you will faint.  Have severe and persistent vomiting.  Feel confused or disoriented. Summary  A respiratory infection is an illness that affects part of the respiratory system, such as the lungs, nose, or throat. A respiratory infection that is caused by a virus is called a viral respiratory infection.  Common types of viral respiratory infections are a cold, influenza, and respiratory syncytial virus (RSV) infection.  Symptoms of this condition include a stuffy or runny nose, cough, sneezing, fatigue, achy muscles, sore throat, and fevers or chills.  Antibiotic medicines are not prescribed for viral infections. This is because antibiotics are designed to kill bacteria. They are not effective against viruses. This information is not intended to replace advice given to you by your health care provider. Make sure you discuss any questions you have with your health care provider. Document Released: 08/23/2005  Document Revised: 12/24/2017 Document Reviewed: 12/24/2017 Elsevier Interactive Patient Education  2019 [employer] Sinusitis, Adult Sinusitis is inflammation of your sinuses. Sinuses are hollow spaces in the bones around your face. Your sinuses are located:  Around your eyes.  In  the middle of your forehead.  Behind your nose.  In your cheekbones. Mucus normally drains out of your sinuses. When your nasal tissues become inflamed or swollen, mucus can become trapped or blocked. This allows bacteria, viruses, and fungi to grow, which leads to infection. Most infections of the sinuses are caused by a virus. Sinusitis can develop quickly. It can last for up to 4 weeks (acute) or for more than 12 weeks (chronic). Sinusitis often develops after a cold. What are the causes? This condition is caused by anything that creates swelling in the sinuses or stops mucus from draining. This includes:  Allergies.  Asthma.  Infection from bacteria or viruses.  Deformities or blockages in your nose or sinuses.  Abnormal growths in the nose (nasal polyps).  Pollutants, such as chemicals or irritants in the air.  Infection from fungi (rare). What increases the risk? You are more likely to develop this condition if you:  Have a weak body defense system (immune system).  Do a lot of swimming or diving.  Overuse nasal sprays.  Smoke. What are the signs or symptoms? The main symptoms of this condition are pain and a feeling of pressure around the affected sinuses. Other symptoms include:  Stuffy nose or congestion.  Thick drainage from your nose.  Swelling and warmth over the affected sinuses.  Headache.  Upper toothache.  A cough that may get worse at night.  Extra mucus that collects in the throat or the back of the nose (postnasal drip).  Decreased sense of smell and taste.  Fatigue.  A fever.  Sore throat.  Bad breath. How is this diagnosed? This condition is  diagnosed based on:  Your symptoms.  Your medical history.  A physical exam.  Tests to find out if your condition is acute or chronic. This may include: ? Checking your nose for nasal polyps. ? Viewing your sinuses using a device that has a light (endoscope). ? Testing for allergies or bacteria. ? Imaging tests, such as an MRI or CT scan. In rare cases, a bone biopsy may be done to rule out more serious types of fungal sinus disease. How is this treated? Treatment for sinusitis depends on the cause and whether your condition is chronic or acute.  If caused by a virus, your symptoms should go away on their own within 10 days. You may be given medicines to relieve symptoms. They include: ? Medicines that shrink swollen nasal passages (topical intranasal decongestants). ? Medicines that treat allergies (antihistamines). ? A spray that eases inflammation of the nostrils (topical intranasal corticosteroids). ? Rinses that help get rid of thick mucus in your nose (nasal saline washes).  If caused by bacteria, your health care provider may recommend waiting to see if your symptoms improve. Most bacterial infections will get better without antibiotic medicine. You may be given antibiotics if you have: ? A severe infection. ? A weak immune system.  If caused by narrow nasal passages or nasal polyps, you may need to have surgery. Follow these instructions at home: Medicines  Take, use, or apply over-the-counter and prescription medicines only as told by your health care provider. These may include nasal sprays.  If you were prescribed an antibiotic medicine, take it as told by your health care provider. Do not stop taking the antibiotic even if you start to feel better. Hydrate and humidify   Drink enough fluid to keep your urine pale yellow. Staying hydrated will help to thin your mucus.  Use a  cool mist humidifier to keep the humidity level in your home above 50%.  Inhale steam for  10-15 minutes, 3-4 times a day, or as told by your health care provider. You can do this in the bathroom while a hot shower is running.  Limit your exposure to cool or dry air. Rest  Rest as much as possible.  Sleep with your head raised (elevated).  Make sure you get enough sleep each night. General instructions   Apply a warm, moist washcloth to your face 3-4 times a day or as told by your health care provider. This will help with discomfort.  Wash your hands often with soap and water to reduce your exposure to germs. If soap and water are not available, use hand sanitizer.  Do not smoke. Avoid being around people who are smoking (secondhand smoke).  Keep all follow-up visits as told by your health care provider. This is important. Contact a health care provider if:  You have a fever.  Your symptoms get worse.  Your symptoms do not improve within 10 days. Get help right away if:  You have a severe headache.  You have persistent vomiting.  You have severe pain or swelling around your face or eyes.  You have vision problems.  You develop confusion.  Your neck is stiff.  You have trouble breathing. Summary  Sinusitis is soreness and inflammation of your sinuses. Sinuses are hollow spaces in the bones around your face.  This condition is caused by nasal tissues that become inflamed or swollen. The swelling traps or blocks the flow of mucus. This allows bacteria, viruses, and fungi to grow, which leads to infection.  If you were prescribed an antibiotic medicine, take it as told by your health care provider. Do not stop taking the antibiotic even if you start to feel better.  Keep all follow-up visits as told by your health care provider. This is important. This information is not intended to replace advice given to you by your health care provider. Make sure you discuss any questions you have with your health care provider. Document Released: 11/13/2005 Document  Revised: 04/15/2018 Document Reviewed: 04/15/2018 Elsevier Interactive Patient Education  2019 [employer]. How to Use a Metered Dose Inhaler A metered dose inhaler is a handheld device for taking medicine that must be breathed into the lungs (inhaled). The device can be used to deliver a variety of inhaled medicines, including:  Quick relief or rescue medicines, such as bronchodilators.  Controller medicines, such as corticosteroids. The medicine is delivered by pushing down on a metal canister to release a preset amount of spray and medicine. Each device contains the amount of medicine that is needed for a preset number of uses (inhalations). Your health care provider may recommend that you use a spacer with your inhaler to help you take the medicine more effectively. A spacer is a plastic tube with a mouthpiece on one end and an opening that connects to the inhaler on the other end. A spacer holds the medicine in a tube for a short time, which allows you to inhale more medicine. What are the risks? If you do not use your inhaler correctly, medicine might not reach your lungs to help you breathe. Inhaler medicine can cause side effects, such as:  Mouth or throat infection.  Cough.  Hoarseness.  Headache.  Nausea and vomiting.  Lung infection (pneumonia) in people who have a lung condition called COPD. How to use a metered dose inhaler without a  spacer  1. Remove the cap from the inhaler. 2. If you are using the inhaler for the first time, shake it for 5 seconds, turn it away from your face, then release 4 puffs into the air. This is called priming. 3. Shake the inhaler for 5 seconds. 4. Position the inhaler so the top of the canister faces up. 5. Put your index finger on the top of the medicine canister. Support the bottom of the inhaler with your thumb. 6. Breathe out normally and as completely as possible, away from the inhaler. 7. Either place the inhaler between your teeth  and close your lips tightly around the mouthpiece, or hold the inhaler 1-2 inches (2.5-5 cm) away from your open mouth. Keep your tongue down out of the way. If you are unsure which technique to use, ask your health care provider. 8. Press the canister down with your index finger to release the medicine, then inhale deeply and slowly through your mouth (not your nose) until your lungs are completely filled. Inhaling should take 4-6 seconds. 9. Hold the medicine in your lungs for 5-10 seconds (10 seconds is best). This helps the medicine get into the small airways of your lungs. 10. With your lips in a tight circle (pursed), breathe out slowly. 11. Repeat steps 3-10 until you have taken the number of puffs that your health care provider directed. Wait about 1 minute between puffs or as directed. 12. Put the cap on the inhaler. 13. If you are using a steroid inhaler, rinse your mouth with water, gargle, and spit out the water. Do not swallow the water. How to use a metered dose inhaler with a spacer  1. Remove the cap from the inhaler. 2. If you are using the inhaler for the first time, shake it for 5 seconds, turn it away from your face, then release 4 puffs into the air. This is called priming. 3. Shake the inhaler for 5 seconds. 4. Place the open end of the spacer onto the inhaler mouthpiece. 5. Position the inhaler so the top of the canister faces up and the spacer mouthpiece faces you. 6. Put your index finger on the top of the medicine canister. Support the bottom of the inhaler and the spacer with your thumb. 7. Breathe out normally and as completely as possible, away from the spacer. 8. Place the spacer between your teeth and close your lips tightly around it. Keep your tongue down out of the way. 9. Press the canister down with your index finger to release the medicine, then inhale deeply and slowly through your mouth (not your nose) until your lungs are completely filled. Inhaling should  take 4-6 seconds. 10. Hold the medicine in your lungs for 5-10 seconds (10 seconds is best). This helps the medicine get into the small airways of your lungs. 11. With your lips in a tight circle (pursed), breathe out slowly. 12. Repeat steps 3-11 until you have taken the number of puffs that your health care provider directed. Wait about 1 minute between puffs or as directed. 13. Remove the spacer from the inhaler and put the cap on the inhaler. 14. If you are using a steroid inhaler, rinse your mouth with water, gargle, and spit out the water. Do not swallow the water. Follow these instructions at home:  Take your inhaled medicine only as told by your health care provider. Do not use the inhaler more than directed by your health care provider.  Keep all follow-up visits as  told by your health care provider. This is important.  If your inhaler has a counter, you can check it to determine how full your inhaler is. If your inhaler does not have a counter, ask your health care provider when you will need to refill your inhaler and write the refill date on a calendar or on your inhaler canister. Note that you cannot know when an inhaler is empty by shaking it.  Follow directions on the package insert for care and cleaning of your inhaler and spacer. Contact a health care provider if:  Symptoms are only partially relieved with your inhaler.  You are having trouble using your inhaler.  You have an increase in phlegm.  You have headaches. Get help right away if:  You feel little or no relief after using your inhaler.  You have dizziness.  You have a fast heart rate.  You have chills or a fever.  You have night sweats.  There is blood in your phlegm. Summary  A metered dose inhaler is a handheld device for taking medicine that must be breathed into the lungs (inhaled).  The medicine is delivered by pushing down on a metal canister to release a preset amount of spray and  medicine.  Each device contains the amount of medicine that is needed for a preset number of uses (inhalations). This information is not intended to replace advice given to you by your health care provider. Make sure you discuss any questions you have with your health care provider. Document Released: 11/13/2005 Document Revised: 06/04/2017 Document Reviewed: 10/03/2016 Elsevier Interactive Patient Education  2019 [employer]. How to Perform a Sinus Rinse A sinus rinse is a home treatment that is used to rinse your sinuses with a sterile mixture of salt and water (saline solution). Sinuses are air-filled spaces in your skull behind the bones of your face and forehead that open into your nasal cavity. A sinus rinse can help to clear mucus, dirt, dust, or pollen from your nasal cavity. You may do a sinus rinse when you have a cold, a virus, nasal allergy symptoms, a sinus infection, or stuffiness in your nose or sinuses. Talk with your health care provider about whether a sinus rinse might help you. What are the risks? A sinus rinse is generally safe and effective. However, there are a few risks, which include:  A burning sensation in your sinuses. This may happen if you do not make the saline solution as directed. Be sure to follow all directions when making the saline solution.  Nasal irritation.  Infection from contaminated water. This is rare, but possible. Do not do a sinus rinse if you have had ear or nasal surgery, ear infection, or blocked ears. Supplies needed:  Saline solution or powder.  Distilled or sterile water may be needed to mix with saline powder. ? You may use boiled and cooled tap water. Boil tap water for 5 minutes; cool until it is lukewarm. Use within 24 hours. ? Do not use regular tap water to mix with the saline solution.  Neti pot or nasal rinse bottle. These supplies release the saline solution into your nose and through your sinuses. Neti pots and nasal rinse  bottles can be purchased at [employer], a health food store, or online. How to perform a sinus rinse  14. Wash your hands with soap and water. 15. Wash your device according to the directions that came with the product and then dry it. 16. Use the solution  that comes with your product or one that is sold separately in stores. Follow the mixing directions on the package if you need to mix with sterile or distilled water. 17. Fill the device with the amount of saline solution noted in the device instructions. 18. Stand over a sink and tilt your head sideways over the sink. 19. Place the spout of the device in your upper nostril (the one closer to the ceiling). 20. Gently pour or squeeze the saline solution into your nasal cavity. The liquid should drain out from the lower nostril if you are not too congested. 21. While rinsing, breathe through your open mouth. 22. Gently blow your nose to clear any mucus and rinse solution. Blowing too hard may cause ear pain. 23. Repeat in your other nostril. 24. Clean and rinse your device with clean water and then air-dry it. Talk with your health care provider or pharmacist if you have questions about how to do a sinus rinse. Summary  A sinus rinse is a home treatment that is used to rinse your sinuses with a sterile mixture of salt and water (saline solution).  A sinus rinse is generally safe and effective. Follow all instructions carefully.  Before doing a sinus rinse, talk with your health care provider about whether it would be helpful for you. This information is not intended to replace advice given to you by your health care provider. Make sure you discuss any questions you have with your health care provider. Document Released: 06/10/2014 Document Revised: 09/10/2017 Document Reviewed: 09/10/2017 Elsevier Interactive Patient Education  2019 [employer].

## 2019-02-21 ENCOUNTER — Telehealth: Payer: Self-pay | Admitting: *Deleted

## 2019-02-21 DIAGNOSIS — F17210 Nicotine dependence, cigarettes, uncomplicated: Secondary | ICD-10-CM

## 2019-02-21 MED ORDER — NICOTINE 21 MG/24HR TD PT24: 21.0000 mg | MEDICATED_PATCH | Freq: Every day | TRANSDERMAL | 0 refills | Status: DC

## 2019-02-21 NOTE — Telephone Encounter (Signed)
Pt called requesting additional help with quitting smoking. She reports when NP started her on 14mg  nicotine patches and 4mg  nicotine gum pieces, she was smoking about 6 cigarettes per day. Now she is under increased stress 2/2 work concerns and COVID19, and reports smoking almost one pack per day now.  She sts she did not care for the taste of the nicotine gum at all, but continued trying the patches, but was unsuccessful with them. Reviewed possible options over the phone of things to help her quit smoking that RN would discuss with NP for a new plan of care. These included increasing patches to 21mg  or initiating oral meds such as Chantix or Wellbutrin. Pt agreeable to these options.  Discussed case with NP Betancourt and orders received for nicotine patches 21mg /24hr patch, place one patch onto the skin daily #28patches RF0 and schedule pt for clinic appt in one week to discuss Chantix and check BP. Pt made aware of this via phone mesg as she had approved due to being on 2nd shift now. Also advised her per NP instructions to call 1-800-Quit-Now and utilize the American Lung Association resources for additional support quitting smoking.   Pt is working on 2nd shift, arriving at 1500 daily, after NP's clinic hours. Appt made for pt to RTC for BP check with RN on RN's late day, Tuesday, 3/31. Appt tentatively made with NP on 4/2, possible phone appt if pt is unable to come to clinic during regular hours. Pt made aware to contact RN with any questions/concerns regarding her plan of care.

## 2019-02-21 NOTE — Telephone Encounter (Signed)
Agreed with plan of care  Signed Rx and will discuss further options with patient at appt next week.

## 2019-02-25 ENCOUNTER — Other Ambulatory Visit: Payer: Self-pay | Admitting: *Deleted

## 2019-02-25 MED ORDER — FLUTICASONE PROPIONATE 50 MCG/ACT NA SUSP: 1.0000 | Freq: Two times a day (BID) | NASAL | 6 refills | Status: DC

## 2019-02-25 MED ORDER — LORATADINE 10 MG PO TABS: 10.0000 mg | ORAL_TABLET | Freq: Every day | ORAL | 3 refills | Status: AC

## 2019-05-08 ENCOUNTER — Telehealth: Payer: Self-pay | Admitting: Registered Nurse

## 2019-05-08 ENCOUNTER — Encounter: Payer: Self-pay | Admitting: Registered Nurse

## 2019-05-08 MED ORDER — SALINE SPRAY 0.65 % NA SOLN: 2.0000 | NASAL | 0 refills | Status: DC

## 2019-05-08 NOTE — Telephone Encounter (Signed)
Patient requested refill of omeprazole DR 20mg  po daily #90 RF0  Patient to follow up with PCM if long term use anticipated to get Rx renewed.  Bridge refill today as helping.  Dispensed from [facility] to patient today.

## 2019-05-12 ENCOUNTER — Telehealth: Payer: Self-pay | Admitting: *Deleted

## 2019-05-12 NOTE — Telephone Encounter (Signed)
Due to current COVID 19 pandemic, our office is severely reducing in office visits until further notice, in order to minimize the risk to our patients and healthcare providers. Unable to get in contact with the patient to convert their office visit with Megan on 05/14/2019 into a doxy.me visit. I left a voicemail asking the patient to return my call. Office number was provided.     If patient calls back please convert their office visit into a doxy.me visit.      

## 2019-05-13 NOTE — Telephone Encounter (Signed)
Called x 2 today and could not LVM.

## 2019-05-14 ENCOUNTER — Ambulatory Visit: Payer: PRIVATE HEALTH INSURANCE | Admitting: Adult Health

## 2019-06-10 ENCOUNTER — Ambulatory Visit: Payer: Self-pay | Admitting: Registered Nurse

## 2019-06-10 ENCOUNTER — Other Ambulatory Visit: Payer: Self-pay

## 2019-06-10 ENCOUNTER — Encounter: Payer: Self-pay | Admitting: Registered Nurse

## 2019-06-10 VITALS — BP 107/78 | HR 81 | Temp 98.6°F

## 2019-06-10 DIAGNOSIS — Z716 Tobacco abuse counseling: Secondary | ICD-10-CM

## 2019-06-10 MED ORDER — CHANTIX STARTING MONTH PAK 0.5 MG X 11 & 1 MG X 42 PO TABS: ORAL_TABLET | ORAL | 0 refills | Status: DC

## 2019-06-10 NOTE — Progress Notes (Signed)
Subjective:    Patient ID: Stephanie Chandler, female    DOB: 1966/10/18, 53 y.o.   MRN: [id number]  53y/o African-American female established pt requesting assistance with tobacco cessation. She has tried nicotine gum and patches previously but did not feel these helped any. She was not able to cut down on smoking any with those. Currently smoking approx 3/4 pack per day.  "I would take off patch so I could smoke"  Covid pandemic/shelter in place hasn't helped.  Not exercising.  Playing games on her phone and smoking has been her relaxation.  She is using her CPAP and stated it has helped a lot with her sleep quality.  Not eating out has helped her weight.  Is working onsite at [employer] and wearing cloth mask.  She would like to try chantix as it has helped some of her friends.     Review of Systems  Constitutional: Negative for activity change, appetite change, chills, diaphoresis, fatigue and fever.  HENT: Negative for trouble swallowing and voice change.   Eyes: Negative for photophobia and visual disturbance.  Respiratory: Negative for cough, shortness of breath, wheezing and stridor.   Cardiovascular: Negative for chest pain.  Gastrointestinal: Negative for abdominal pain, nausea and vomiting.  Endocrine: Negative for cold intolerance and heat intolerance.  Genitourinary: Negative for difficulty urinating.  Musculoskeletal: Negative for gait problem and neck pain.  Skin: Negative for color change, pallor, rash and wound.  Allergic/Immunologic: Positive for environmental allergies. Negative for food allergies.  Neurological: Negative for dizziness, tremors, seizures, syncope, facial asymmetry, speech difficulty, weakness, light-headedness, numbness and headaches.  Hematological: Negative for adenopathy. Does not bruise/bleed easily.  Psychiatric/Behavioral: Negative for agitation, confusion and sleep disturbance.       Objective:   Physical Exam Vitals signs and nursing note  reviewed.  Constitutional:      General: She is awake. She is not in acute distress.    Appearance: Normal appearance. She is well-developed and well-groomed. She is obese. She is not ill-appearing, toxic-appearing or diaphoretic.  HENT:     Head: Normocephalic and atraumatic.     Jaw: There is normal jaw occlusion.     Right Ear: Hearing and external ear normal.     Left Ear: Hearing and external ear normal.     Nose: Nose normal. No congestion.     Mouth/Throat:     Mouth: Mucous membranes are moist.     Pharynx: Oropharynx is clear. Uvula midline.  Eyes:     General: Lids are normal. No visual field deficit or scleral icterus.       Right eye: No discharge.        Left eye: No discharge.     Extraocular Movements: Extraocular movements intact.     Conjunctiva/sclera: Conjunctivae normal.     Pupils: Pupils are equal, round, and reactive to light.  Neck:     Musculoskeletal: Normal range of motion and neck supple. Normal range of motion. No edema, erythema, neck rigidity, crepitus, pain with movement or torticollis.     Trachea: Trachea and phonation normal.  Cardiovascular:     Rate and Rhythm: Normal rate and regular rhythm.     Heart sounds: Normal heart sounds.  Pulmonary:     Effort: Pulmonary effort is normal. No respiratory distress.     Breath sounds: Normal breath sounds and air entry. No stridor, decreased air movement or transmitted upper airway sounds. No decreased breath sounds, wheezing, rhonchi or rales.  Comments: Spoke full sentences without difficulty; no cough observed in clinic Abdominal:     Palpations: Abdomen is soft.  Musculoskeletal: Normal range of motion.     Right shoulder: Normal.     Left shoulder: Normal.     Right elbow: Normal.    Left elbow: Normal.     Right hip: Normal.     Left hip: Normal.     Right knee: Normal.     Left knee: Normal.     Cervical back: Normal.     Thoracic back: Normal.     Lumbar back: Normal.     Right hand:  Normal.     Left hand: Normal.     Right lower leg: No edema.     Left lower leg: No edema.  Lymphadenopathy:     Head:     Right side of head: No submental, submandibular or preauricular adenopathy.     Left side of head: No submental, submandibular or preauricular adenopathy.     Cervical: No cervical adenopathy.     Right cervical: No superficial cervical adenopathy.    Left cervical: No superficial cervical adenopathy.  Skin:    General: Skin is warm and dry.     Capillary Refill: Capillary refill takes less than 2 seconds.     Coloration: Skin is not ashen, cyanotic, jaundiced, mottled, pale or sallow.     Findings: No abrasion, abscess, acne, bruising, burn, ecchymosis, erythema, signs of injury, laceration, lesion, petechiae, rash or wound.     Nails: There is no clubbing.   Neurological:     General: No focal deficit present.     Mental Status: She is alert and oriented to person, place, and time. Mental status is at baseline.     GCS: GCS eye subscore is 4. GCS verbal subscore is 5. GCS motor subscore is 6.     Cranial Nerves: Cranial nerves are intact. No cranial nerve deficit, dysarthria or facial asymmetry.     Sensory: Sensation is intact.     Motor: Motor function is intact. No weakness, tremor, atrophy, abnormal muscle tone or seizure activity.     Coordination: Coordination is intact.     Gait: Gait is intact.     Comments: Gait sure and steady in hallway; in/out of chair and on/off exam table without difficulty  Psychiatric:        Attention and Perception: Attention and perception normal.        Mood and Affect: Mood and affect normal.        Speech: Speech normal.        Behavior: Behavior normal. Behavior is cooperative.        Thought Content: Thought content normal.        Cognition and Memory: Cognition and memory normal.        Judgment: Judgment normal.           Assessment & Plan:  A-tobacco cessation counseling  P-discussed stop nicotine gum  and patch use as they didn't help to control her cravings at all.  Discussed start chantix 0.5mg  po daily x 3 days then 0.5mg  po BID x 4 days then increase to 1mg  po BID.  Starter pack prescribed for patient electronic Rx to her pharmacy of choice.  Patient to notify us when she starts taking medication.  She is going to think about it for a while longer. Discussed she may continue smoking for the first 7 days but then should stop on day  8 of chantix use.   Fasting labs exec panel scheduled as patient last labs 2019 renal function normal.  Discussed possible side effects re mood changes depression, anxiety, vivid dreams.  Patient denied HI/SI discussed if HI/SI to go to ER, call PCM after hours line, call 1-800 national suicide hotline.  If patient experiences changes in mood that are not tolerable we would taper off the medication.  Discussed with patient American Lung Association quit line helpful support system and also has options of support groups online.  Encouraged patient to restart her walking program start at 5 minutes duration first week then 10 minutes second week, 12 minutes third week--increase time or distance 10% per week thereafter.  Patient desires to stop smoking, she knows it is unhealthy but has been unable to control cravings/habits up to this point.  Patient is to schedule appt with PCM to ensure they are aware of her chantix use also and will follow up for lab results 72 hours after serum draw with RN Rolly SalterHaley scheduled later this week and follow up with me 1 week after starting chantix.  Discussed per epocrates there were no known medication interactions with her chronic and known prn medications.  Discussed with patient typically chantix used for up to one year then stopped.  Discussed some patients require restarting chantix at a later time if they restart smoking in the future after stopping.  Discussed some patients cannot tolerate vivid dreams and medications will be stopped.  [employer]care  handout tobacco printed and given to patient.  Patient verbalized understanding information/instructions, agreed with plan of care and had no further questions at this time.

## 2019-06-10 NOTE — Patient Instructions (Signed)

## 2019-06-13 ENCOUNTER — Other Ambulatory Visit: Payer: Self-pay

## 2019-06-13 ENCOUNTER — Ambulatory Visit: Payer: Self-pay | Admitting: *Deleted

## 2019-06-13 VITALS — BP 111/82 | HR 84 | Wt 203.0 lb

## 2019-06-13 DIAGNOSIS — Z716 Tobacco abuse counseling: Secondary | ICD-10-CM

## 2019-06-13 NOTE — Progress Notes (Signed)
Fasting labs per NP 06-10-19 encounter.

## 2019-06-14 LAB — CMP12+LP+TP+TSH+6AC+CBC/D/PLT
ALT: 23 IU/L (ref 0–32)
AST: 22 IU/L (ref 0–40)
Albumin/Globulin Ratio: 1.7 (ref 1.2–2.2)
Albumin: 4.3 g/dL (ref 3.8–4.9)
Alkaline Phosphatase: 71 IU/L (ref 39–117)
BUN/Creatinine Ratio: 16 (ref 9–23)
BUN: 12 mg/dL (ref 6–24)
Basophils Absolute: 0 10*3/uL (ref 0.0–0.2)
Basos: 1 %
Bilirubin Total: 0.3 mg/dL (ref 0.0–1.2)
Calcium: 9.1 mg/dL (ref 8.7–10.2)
Chloride: 102 mmol/L (ref 96–106)
Chol/HDL Ratio: 4.2 ratio (ref 0.0–4.4)
Cholesterol, Total: 175 mg/dL (ref 100–199)
Creatinine, Ser: 0.75 mg/dL (ref 0.57–1.00)
EOS (ABSOLUTE): 0.4 10*3/uL (ref 0.0–0.4)
Eos: 5 %
Estimated CHD Risk: 0.9 times avg. (ref 0.0–1.0)
Free Thyroxine Index: 1.8 (ref 1.2–4.9)
GFR calc Af Amer: 105 mL/min/{1.73_m2} (ref >59)
GFR calc non Af Amer: 91 mL/min/{1.73_m2} (ref >59)
GGT: 38 IU/L (ref 0–60)
Globulin, Total: 2.6 g/dL (ref 1.5–4.5)
Glucose: 101 mg/dL — ABNORMAL HIGH (ref 65–99)
HDL: 42 mg/dL (ref >39)
Hematocrit: 39.1 % (ref 34.0–46.6)
Hemoglobin: 13.3 g/dL (ref 11.1–15.9)
Immature Grans (Abs): 0 10*3/uL (ref 0.0–0.1)
Immature Granulocytes: 0 %
Iron: 112 ug/dL (ref 27–159)
LDH: 190 IU/L (ref 119–226)
LDL Calculated: 121 mg/dL — ABNORMAL HIGH (ref 0–99)
Lymphocytes Absolute: 2.1 10*3/uL (ref 0.7–3.1)
Lymphs: 31 %
MCH: 30.2 pg (ref 26.6–33.0)
MCHC: 34 g/dL (ref 31.5–35.7)
MCV: 89 fL (ref 79–97)
Monocytes Absolute: 0.6 10*3/uL (ref 0.1–0.9)
Monocytes: 9 %
Neutrophils Absolute: 3.6 10*3/uL (ref 1.4–7.0)
Neutrophils: 54 %
Phosphorus: 3.1 mg/dL (ref 3.0–4.3)
Platelets: 279 10*3/uL (ref 150–450)
Potassium: 4.3 mmol/L (ref 3.5–5.2)
RBC: 4.4 x10E6/uL (ref 3.77–5.28)
RDW: 12.9 % (ref 11.7–15.4)
Sodium: 143 mmol/L (ref 134–144)
T3 Uptake Ratio: 26 % (ref 24–39)
T4, Total: 7 ug/dL (ref 4.5–12.0)
TSH: 1.1 u[IU]/mL (ref 0.450–4.500)
Total Protein: 6.9 g/dL (ref 6.0–8.5)
Triglycerides: 58 mg/dL (ref 0–149)
Uric Acid: 3.7 mg/dL (ref 2.5–7.1)
VLDL Cholesterol Cal: 12 mg/dL (ref 5–40)
WBC: 6.7 10*3/uL (ref 3.4–10.8)

## 2019-06-14 LAB — HGB A1C W/O EAG: Hgb A1c MFr Bld: 6.2 % — ABNORMAL HIGH (ref 4.8–5.6)

## 2019-06-26 ENCOUNTER — Encounter: Payer: Self-pay | Admitting: Registered Nurse

## 2019-06-26 ENCOUNTER — Telehealth: Payer: Self-pay | Admitting: Registered Nurse

## 2019-06-26 NOTE — Telephone Encounter (Signed)
Patient requested refill motrin 800mg  po TID prn arm/neck pain #30 RF0 from [facility].  She still has flexeril at home for prn muscle spasms use.  Patient still smoking has not started chantix but thinking about starting in 48 hours on Saturday.  Discussed with patient to notify RN Hildred Alamin on Monday if she does start medication (chantix) and reviewed possible side effects and dosing schedule.  Patient given motrin for prn use and if daily use will require Rx from [facility] for chronic use.  Patient verbalized understanding information/instructions, agreed with plan of care and had no further questions at this time.

## 2019-07-01 NOTE — Telephone Encounter (Signed)
Pt called clinic to report that she had started Chantix on Friday evening 06/27/2019. Sts she had trouble sleeping the first couple of nights after starting med, but this resolved and she denies any current side effects/adverse reactions.

## 2019-07-07 ENCOUNTER — Ambulatory Visit: Payer: Self-pay | Admitting: *Deleted

## 2019-07-07 ENCOUNTER — Other Ambulatory Visit: Payer: Self-pay

## 2019-07-07 DIAGNOSIS — H9202 Otalgia, left ear: Secondary | ICD-10-CM

## 2019-07-07 DIAGNOSIS — J302 Other seasonal allergic rhinitis: Secondary | ICD-10-CM

## 2019-07-07 NOTE — Progress Notes (Signed)
Pt reports L ear pain x2 days. Sts intermittent sharp pains. Denies muffled sounds, ear drainage. Sts she normally has seasonal allergies and has been outside more than normal in the past few weeks as she started walking outside daily. Just restarted Flonase this morning. Upon exam, cloudy L TM. No erythema noted. No cerumen build up. R ear WNL. Advised to continue Flonase 1 spray each nostril BID. Refills available at [facility] per previous fill in March 2020. Pt has nasal saline bottle at work. Advised to restart this 1-2 sprays each nostril q2h wa and as nasal rinse in shower every night. Also given phenylephrine 10mg  po q4-6h from clinic [place]. Advised to continue this until sx resolve and continue Flonase, saline spray and otc antihistamine of choice, Claritin, while she continues to be outside daily. PT denies sinus pain/pressure, sore throat, HA, chest or nasal congestion, cough. Advised to follow up in clinic on Thursday 8/13 if sx are worsening or not improving. Pt verbalizes understanding and agreement. Denies further questions/concerns.

## 2019-08-07 ENCOUNTER — Telehealth: Payer: Self-pay | Admitting: *Deleted

## 2019-08-07 ENCOUNTER — Encounter: Payer: Self-pay | Admitting: *Deleted

## 2019-08-07 DIAGNOSIS — F17210 Nicotine dependence, cigarettes, uncomplicated: Secondary | ICD-10-CM

## 2019-08-07 MED ORDER — VARENICLINE TARTRATE 1 MG PO TABS: 1.0000 mg | ORAL_TABLET | Freq: Two times a day (BID) | ORAL | 1 refills | Status: AC

## 2019-08-07 NOTE — Telephone Encounter (Signed)
Pt called clinic requesting refill of Chantix. Sts she ran out 2 days ago. She is still smoking, was not able to quit on the 8th day of use as recommended. Started taking originally on 06/27/2019. She was taking 1mg  BID. She sts she is smoking 2 cigarettes a day typically. Endorses increased stress and cravings and feels that if she wasn't taking the Chantix she would easily be smoking a pack a day.  She denies any side effects from medication. 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

## 2019-08-07 NOTE — Telephone Encounter (Signed)
Discussed with patient she is to stop smoking between days 8-35 per Epocrates while taking chantix.  She denied side effects and stated working well for her to decrease cravings.  Has decreased to 2 cigarettes per day but having a lot of life stressors making it difficult to quit completely.  Electronic Rx to her pharmacy of choice today chantix 1mg  po BID #60 RF1. Follow up prn med refills/new concerns. Patient verbalized understanding information/instructions, agreed with plan of care and had no further questions at this time.

## 2019-09-12 ENCOUNTER — Other Ambulatory Visit: Payer: Self-pay

## 2019-09-12 DIAGNOSIS — Z20822 Contact with and (suspected) exposure to covid-19: Secondary | ICD-10-CM

## 2019-09-14 ENCOUNTER — Telehealth: Payer: Self-pay

## 2019-09-14 NOTE — Telephone Encounter (Signed)
Pt called for covid results. Pt advised that results are not back and reminded her that she will be notified via [place].

## 2019-09-15 LAB — NOVEL CORONAVIRUS, NAA

## 2019-09-16 ENCOUNTER — Other Ambulatory Visit: Payer: Self-pay

## 2019-09-16 DIAGNOSIS — Z20822 Contact with and (suspected) exposure to covid-19: Secondary | ICD-10-CM

## 2019-09-18 ENCOUNTER — Telehealth: Payer: Self-pay

## 2019-09-18 LAB — NOVEL CORONAVIRUS, NAA: SARS-CoV-2, NAA: NOT DETECTED

## 2019-09-18 NOTE — Telephone Encounter (Signed)
Patient called in requesting COVID19 lab results - DOB/Address verified - Negative results given. Reset MyChart password, no further questions. 

## 2019-09-22 ENCOUNTER — Telehealth: Payer: Self-pay | Admitting: *Deleted

## 2019-09-22 NOTE — Telephone Encounter (Signed)
Pt called clinic requesting to refill Diclofenac 75mg  po BID for arthritis pain. Same pain as previous, bilateral knees and lower back pain. Diclofenac last filled April 2018. Sts she stopped taking 2/2 GI upset and switched to Ibuprofen, but ibuprofen no longer controlling sx. Had good sx control when taking Diclofenac. Advised to ensure she is taking med with food to avoid GI upset. She sts she was not doing that previously and will going forward. Please advise if eligible for refill and will dispense through clinic [facility] or if in-clinic NP appt is needed.  She declines need of Omeprazole refill currently. Last filled June 2020. Takes prn.

## 2019-09-23 ENCOUNTER — Encounter: Payer: Self-pay | Admitting: *Deleted

## 2019-09-23 MED ORDER — DICLOFENAC SODIUM 75 MG PO TBEC: 75.0000 mg | DELAYED_RELEASE_TABLET | Freq: Two times a day (BID) | ORAL | 0 refills | Status: AC

## 2019-09-23 NOTE — Telephone Encounter (Signed)
Noted. LVM on pt's private work line advising she can pick up refill from clinic and to avoid taking other NSAIDs as above within 24 hrs of Diclofenac. Requested she f/u with clinic staff in approx 3 weeks to determine if improvement with med and if refill is needed/indicated.

## 2019-09-23 NOTE — Telephone Encounter (Signed)
Will trial diclofenac 75mg  EC po bid prn pain #60 RF0 take with food for arthritis/back pain dispensed from [facility] to patient today.  Ensure patient counseled not to take within 24 hours advil/aleve/ibuprofen/motrin/naproxen/naprosyn/mobic while taking diclofenac.  Consider diclofenac topical if unable to tolerate po.

## 2019-09-23 NOTE — Telephone Encounter (Signed)
noted 

## 2019-10-17 ENCOUNTER — Telehealth: Payer: Self-pay | Admitting: *Deleted

## 2019-10-17 MED ORDER — PREDNISONE 10 MG PO TABS: ORAL_TABLET | ORAL | 0 refills | Status: AC

## 2019-10-17 MED ORDER — AMOXICILLIN 875 MG PO TABS: 875.0000 mg | ORAL_TABLET | Freq: Two times a day (BID) | ORAL | 0 refills | Status: DC

## 2019-10-17 NOTE — Telephone Encounter (Signed)
Pt called clinic requesting abx for sinus infection. She is currently quarantining at home after possible covid exposure on 11/14 and sinus inf sx starting 2 days later. Negative covid test result from 10/16/19 test. C/o PND, frontal sinus pain/pressure, pressure behind eyes, bilateral ear pressure, nasal and chest congestion. She is currently treating at home with Sudafed, Flonase, nasal saline spray, Claritin. Advised she could add Guaifenesin to help thin secretions and clear congestion. She has albuterol inh but has not felt like she has needed it.   Case discussed with NP Betancourt over phone. Reviewed current meds and sx. Verbal orders received for Prednisone taper 10mg  po (60/50/40/30/20/10) #21 RF0 and Amoxicillin 875mg  po BID x10 days #20 RF0. Pt requests meds be given to coworker Alecia Lemming to have them dropped at her home. Will advise coworker to leave at door and not enter home.  Continue OTC treatments currently using. Pt has no further questions/concerns. F/u with clinic if sx worsen or fail to improve.

## 2019-10-17 NOTE — Telephone Encounter (Signed)
Noted, agree discussed patient plan of care via telephone with RN Hildred Alamin and orders as written.  Continue plan of care as discussed.

## 2019-10-20 ENCOUNTER — Other Ambulatory Visit: Payer: Self-pay

## 2019-10-20 DIAGNOSIS — Z20822 Contact with and (suspected) exposure to covid-19: Secondary | ICD-10-CM

## 2019-10-21 NOTE — Telephone Encounter (Signed)
Per chart review, appears pt obtained 2nd covid test on 10/20/19 through [facility] community testing. Scheduled to return to work on 10/27/19 after 14 day quarantine. Once results have returned, will f/u with pt over phone to discuss current sx and new results.

## 2019-10-21 NOTE — Telephone Encounter (Signed)
Noted.  I will contact patient tomorrow for update on her symptoms since [facility] closed/RN [place] day off and I am working in another clinic.

## 2019-10-22 ENCOUNTER — Encounter: Payer: Self-pay | Admitting: *Deleted

## 2019-10-22 LAB — NOVEL CORONAVIRUS, NAA: SARS-CoV-2, NAA: DETECTED — AB

## 2019-10-22 NOTE — Telephone Encounter (Signed)
Patient contacted via telephone and notified of positive covid results.  Patient stated she had spoken to a technician earlier today and received her results.  She is awaiting telephone call from [employer] for return to work.  She is scheduled to talk with HR Tim 1 Dec.

## 2019-10-23 NOTE — Telephone Encounter (Signed)
Late entry continuation of telephone call 10/22/2019 at [place].  Patient reported still having some cough, drainage, congestion.  She received her medications from [facility] and is taking them but spreading out prednisone and antibiotics with her regular medications as they are hard on her stomach otherwise.  She has been wearing her CPAP at night and asked if she can wear her CPAP during the day.  Discussed with patient that if she needs CPAP during the day she needs to have re-evaluation by a provider  I discussed with patient if her lips/face blue, syncope, confusion, worsening dyspnea she should go to ER for re-evaluation.  Patient reported she does not have spo2 monitor at home or a thermometer.  She hasn't felt hot but unsure if any fever since last temperature check at work.  Patient voice sounds nasally congested on phone.  No cough heard.  Some nose sniffing and throat clearing noted.  She has albuterol MDI for prn use  Voice strong spoke full sentences and telephone conversation for 10 minutes without difficulty.  Patient stated she would have someone bring her thermometer tomorrow or order online.  Discussed to keep hydrated/avoid dehydration and continue monitoring her symptoms while in home quarantine and that I would call her each day this holiday weekend for status update as [facility] closed until 27 Oct 2019.  Patient verbalized understanding information/instructions, agreed with plan of care and had no further questions at this time.

## 2019-10-24 NOTE — Telephone Encounter (Signed)
Attempted to reach patient via telephone no answer left message that I would attempt to call her again later today after 6pm to follow up how she is doing today.

## 2019-10-25 NOTE — Telephone Encounter (Signed)
Late entry due to no internet connection 10/23/2019  Patient contacted via telephone duration 10 minutes.  Patient reported her temperature today 99.7 F this morning.  Had explosive watery diarrhea this am after drinking juice.  She thinks her allergies acting up.  Some chest discomfort using her albuterol and phenylephrine.  Stopped mucinex use; didn't take any today.  She sat outside to get sunshine and fresh air but wondering if that set off her allergies.  Put her CPAP on during day today for a short while because she was short of breath with activity.  Dyspnea on exertion resolved with rest and CPAP use.  She has noticed fatigue today also.  Discussed with patient juice can sometimes worsen diarrhea try gatorade/powerade/soup broth/ginger ale/water instead.  Large portions of milk/dairy products can promote mucous production.  Encouraged patient to use albuterol inhaler on a schedule when awake; rest periods, regular meals and to hydrate.  Bland diet avoid large amounts spicy/fried/dairy or large portions of meat as harder to digest.  Rare cough/throat clearing noted.  Discussed with patient mucinex used to break up thick mucous and she doesn't need to take unless she is having trouble expectorating thick sputum.  Voice strong.  A&Ox3.  Discussed with patient I would contact her via telephone again tomorrow. Discussed if blue lips/face, worsening dyspnea/sob not resolving with rest/CPAP or confusion she is to be re-evaluated by a provider. Patient verbalized understanding information/instructions, agreed with plan of care and had no further questions at this time.

## 2019-10-25 NOTE — Telephone Encounter (Signed)
Patient contacted via telephone reported yesterday very little appetite and fatigue plus headache.  Cough better.  Having some midline abdomen pain thinks it is from no bowel movement in a couple days.  Yesterday only had french fries around 7pm to eat.  Didn't drink much yesterday either.  Cleaned her CPAP yesterday so didn't wear during the day yesterday.  Not much activity yesterday was sleeping when I called and why she did not answer her phone.  Patient typically doesn't drink coffee sometimes soda with caffeine.  Discussed with patient if she gets urge to defecate to go right away because ignoring signal can worsen constipation.  Stool transit time will lengthen when not having much po intake try to increase fiber through fruits/vegetables/whole grains.  If unable to eat larger meal have smaller snack through out day.  Patient stated she was going to make spinach, strawberry, banana smoothie this morning to sip along with water.  Discussed with patient she should drink 1 cup water per 30 minutes when awake.  Discussed with cough/runny nose can lose up to extra liter of fluid per day.  Patient asked if she could go walking.  Stated yes but not to go long distance from her house in case she fatigues/gets winded and she can return easier.  Discussed with patient headache can be from not eating, getting dehydrated, viral illness, possibly lower oxygen level (hypoxia).  To have drink/snack and see if headache resolves.  If not then try some caffeine.  If no relief then utilize cpap to see if headache resolves.  If headache worsening and nothing relieving consider getting evaluated by a provider to check her oxygen level.  Continue monitoring for red flags e.g. blue lips/face, confusion, syncope, worsening dyspnea/SOB to seek re-evaluation at ER.  Discussed with patient headache, body aches, fatigue, no appetite have been common in patients with covid infection lasting days to weeks.  Discussed with patient I would  check in with her again tomorrow.  Patient encouraged to rest/hydrate and eat small snacks today throughout the day. Activity as tolerated.   Patient verbalized understanding information/instructions, agreed with plan of care and had no further questions at this time.

## 2019-10-26 ENCOUNTER — Emergency Department (HOSPITAL_COMMUNITY)
Admission: EM | Admit: 2019-10-26 | Discharge: 2019-10-26 | Disposition: A | Discharge status: Home / Self Care | Payer: PRIVATE HEALTH INSURANCE | Attending: Emergency Medicine | Admitting: Emergency Medicine

## 2019-10-26 ENCOUNTER — Encounter (HOSPITAL_COMMUNITY): Payer: Self-pay

## 2019-10-26 ENCOUNTER — Other Ambulatory Visit: Payer: Self-pay

## 2019-10-26 DIAGNOSIS — U071 COVID-19: Secondary | ICD-10-CM | POA: Insufficient documentation

## 2019-10-26 DIAGNOSIS — F17210 Nicotine dependence, cigarettes, uncomplicated: Secondary | ICD-10-CM | POA: Diagnosis not present

## 2019-10-26 DIAGNOSIS — R05 Cough: Secondary | ICD-10-CM | POA: Diagnosis present

## 2019-10-26 DIAGNOSIS — Z79899 Other long term (current) drug therapy: Secondary | ICD-10-CM | POA: Insufficient documentation

## 2019-10-26 NOTE — ED Provider Notes (Signed)
[facility]-EMERGENCY DEPT Provider Note   CSN: [id number] Arrival date & time: 10/26/19  0957     History   Chief Complaint Chief Complaint  Patient presents with  . COVID POSITIVE    HPI Stephanie Chandler is a 53 y.o. female.     53 year old female recently diagnosed with Covid who presents with slight increase to her cough.  Denies any fever or chills.  No vomiting although some slight loose stools.  No severe dyspnea on exertion.  Does note some chest tightness.  She completed a course of prednisone recently.  Has been using over-the-counter medications.  Used her inhaler prior to arrival which did help her symptoms.     Past Medical History:  Diagnosis Date  . Arthritis     Patient Active Problem List   Diagnosis Date Noted  . Class 3 severe obesity due to excess calories without serious comorbidity with body mass index (BMI) of 40.0 to 44.9 in adult (HCC) 12/12/2018  . Acute pain of right shoulder 03/15/2017  . Strain of right trapezius muscle 03/15/2017  . Musculoskeletal pain 03/15/2017  . OSA (obstructive sleep apnea) 08/23/2015  . Sciatica 11/13/2012    Past Surgical History:  Procedure Laterality Date  . ABDOMINAL HYSTERECTOMY    . HYSTERECTOMY ABDOMINAL WITH SALPINGECTOMY  1997     OB History   No obstetric history on file.      Home Medications    Prior to Admission medications   Medication Sig Start Date End Date Taking? Authorizing Provider  albuterol (PROVENTIL HFA;VENTOLIN HFA) 108 (90 Base) MCG/ACT inhaler Inhale 1-2 puffs into the lungs every 4 (four) hours as needed for wheezing or shortness of breath. 12/12/18   Betancourt, Jarold Songina A, NP  amoxicillin (AMOXIL) 875 MG tablet Take 1 tablet (875 mg total) by mouth 2 (two) times daily. 10/17/19   Betancourt, Jarold Songina A, NP  cyclobenzaprine (FLEXERIL) 10 MG tablet Take 0.5-1 tablets (5-10 mg total) by mouth 3 (three) times daily as needed for muscle spasms. 11/07/18   Betancourt, Jarold Songina  A, NP  diazepam (VALIUM) 5 MG tablet Take 1 tablet (5 mg total) by mouth every 12 (twelve) hours as needed (dizziness). Patient not taking: Reported on 06/10/2019 06/09/18   Horton, Mayer Maskerourtney F, MD  fluticasone [facility](FLONASE) 50 MCG/ACT nasal spray Place 1 spray into both nostrils 2 (two) times daily. 02/25/19   Betancourt, Jarold Songina A, NP  loratadine (CLARITIN) 10 MG tablet Take 1 tablet (10 mg total) by mouth daily. 02/25/19   Betancourt, Jarold Songina A, NP  omeprazole (PRILOSEC) 40 MG capsule TAKE 1 CAPSULE BY MOUTH ONCE DAILY FOR 90 DAYS 07/11/18   [provider]  sodium chloride (OCEAN) 0.65 % SOLN nasal spray Place 2 sprays into both nostrils every 2 (two) hours while awake for 30 days. 05/08/19 06/26/19  Betancourt, Jarold Songina A, NP    Family History Family History  Problem Relation Age of Onset  . Diabetes Mother   . Hypertension Mother   . Arthritis Mother   . Diabetes Father   . Kidney disease Father   . Heart disease Maternal Grandmother   . Diabetes Paternal Grandmother     Social History Social History   Tobacco Use  . Smoking status: Light Tobacco Smoker    Packs/day: 0.12    Years: 0.50    Pack years: 0.06    Types: Cigarettes  . Smokeless tobacco: Never Used  Substance Use Topics  . Alcohol use: Yes    Alcohol/week: 1.0  standard drinks    Types: 1 Glasses of wine per week    Comment: 1 glass a year  . Drug use: No     Allergies   Flagyl [metronidazole]   Review of Systems Review of Systems  All other systems reviewed and are negative.    Physical Exam Updated Vital Signs BP (!) 148/94 (BP Location: Left Arm)   Pulse 82   Temp 98.3 F (36.8 C) (Oral)   Resp 20   SpO2 99%   Physical Exam Vitals signs and nursing note reviewed.  Constitutional:      General: She is not in acute distress.    Appearance: Normal appearance. She is well-developed. She is not toxic-appearing.  HENT:     Head: Normocephalic and atraumatic.  Eyes:     General: Lids are normal.      Conjunctiva/sclera: Conjunctivae normal.     Pupils: Pupils are equal, round, and reactive to light.  Neck:     Musculoskeletal: Normal range of motion and neck supple.     Thyroid: No thyroid mass.     Trachea: No tracheal deviation.  Cardiovascular:     Rate and Rhythm: Normal rate and regular rhythm.     Heart sounds: Normal heart sounds. No murmur. No gallop.   Pulmonary:     Effort: Pulmonary effort is normal. No respiratory distress.     Breath sounds: Normal breath sounds. No stridor. No decreased breath sounds, wheezing, rhonchi or rales.  Abdominal:     General: Bowel sounds are normal. There is no distension.     Palpations: Abdomen is soft.     Tenderness: There is no abdominal tenderness. There is no rebound.  Musculoskeletal: Normal range of motion.        General: No tenderness.  Skin:    General: Skin is warm and dry.     Findings: No abrasion or rash.  Neurological:     Mental Status: She is alert and oriented to person, place, and time.     GCS: GCS eye subscore is 4. GCS verbal subscore is 5. GCS motor subscore is 6.     Cranial Nerves: No cranial nerve deficit.     Sensory: No sensory deficit.  Psychiatric:        Speech: Speech normal.        Behavior: Behavior normal.      ED Treatments / Results  Labs (all labs ordered are listed, but only abnormal results are displayed) Labs Reviewed - No data to display  EKG None  Radiology No results found.  Procedures Procedures (including critical care time)  Medications Ordered in ED Medications - No data to display   Initial Impression / Assessment and Plan / ED Course  I have reviewed the triage vital signs and the nursing notes.  Pertinent labs & imaging results that were available during my care of the patient were reviewed by me and considered in my medical decision making (see chart for details).        Patient with pulse oximetry of 100 here.  Respiratory rate is 20.  No signs of  respiratory distress.  Patient is on amoxicillin at this time.  Do not feel she needs any blood work or imaging at this time.  She is comfortable with this plan of care.  Patient is scheduled to return to work tomorrow and informed her that she really needs to quarantine.  We will give her CDC instructions.  Final Clinical Impressions(s) /  ED Diagnoses   Final diagnoses:  None    ED Discharge Orders    None       Lorre Nick, MD 10/26/19 1037

## 2019-10-26 NOTE — ED Notes (Signed)
Allen, MD at bedside

## 2019-10-26 NOTE — Telephone Encounter (Signed)
Patient contacted via telephone.  Patient reported she went to [facility] ED today around 0900 due to diarrhea, headache and feeling SOB.  Her VSS and sp02 RA 99-100% No labs or xrays done due to essentially normal exam at [facility] ER reviewed provider note.   Patient reported she still has headache now but after ER slept most of the day and had some more fruit but not much of an appetite.  No diarrhea since dinner time.  She had fruit cup for breakfast and then had diarrhea.  She tried hydrating with water and a soda for caffeine I recommended for headache without relief and due to shortness of breath concerned her oxygen level low so went to ER for evaluation.  No cough heard during telephone conversation.  Patient reported she has been drinking water.  Encouraged bland diet and gatorade/powerarde/ginger ale/7up/sprite.  Avoid large amounts dairy, meat and spicy/fried foods until diarrhea resolved.  Discussed RN Hildred Alamin would check on her tomorrow 30 Nov.  Patient reported she is feeling a little better now than yesterday.  Discussed not to stay up all night since slept most of the day but try to fall back asleep after hydrating and snack again.  Patient verbalized understanding information/instructions, agreed with plan of care and had no further questions at this time.

## 2019-10-26 NOTE — ED Triage Notes (Signed)
Patient reports testing COVID POSTIVE this past Wednesday.  Patient reports increased shob with exertion and feels much better with rest X2 days.   No shob or trouble breathing during triage.   Patient reports new dry cough X1 day.   Hx. Asthma (Patient reports she has inhaler at home and last used it 45 minutes ago with releif.

## 2019-10-26 NOTE — Discharge Instructions (Addendum)
Person Under Monitoring Name: Stephanie Chandler  Location: [address] [place] [address]   Infection Prevention Recommendations for Individuals Confirmed to have, or Being Evaluated for, 2019 Novel Coronavirus (COVID-19) Infection Who Receive Care at Home  Individuals who are confirmed to have, or are being evaluated for, COVID-19 should follow the prevention steps below until a healthcare provider or local or state health department says they can return to normal activities.  Stay home except to get medical care You should restrict activities outside your home, except for getting medical care. Do not go to work, school, or public areas, and do not use public transportation or taxis.  Call ahead before visiting your doctor Before your medical appointment, call the healthcare provider and tell them that you have, or are being evaluated for, COVID-19 infection. This will help the healthcare providers office take steps to keep other people from getting infected. Ask your healthcare provider to call the local or state health department.  Monitor your symptoms Seek prompt medical attention if your illness is worsening (e.g., difficulty breathing). Before going to your medical appointment, call the healthcare provider and tell them that you have, or are being evaluated for, COVID-19 infection. Ask your healthcare provider to call the local or state health department.  Wear a facemask You should wear a facemask that covers your nose and mouth when you are in the same room with other people and when you visit a healthcare provider. People who live with or visit you should also wear a facemask while they are in the same room with you.  Separate yourself from other people in your home As much as possible, you should stay in a different room from other people in your home. Also, you should use a separate bathroom, if available.  Avoid sharing household items You should not share  dishes, drinking glasses, cups, eating utensils, towels, bedding, or other items with other people in your home. After using these items, you should wash them thoroughly with soap and water.  Cover your coughs and sneezes Cover your mouth and nose with a tissue when you cough or sneeze, or you can cough or sneeze into your sleeve. Throw used tissues in a lined trash can, and immediately wash your hands with soap and water for at least 20 seconds or use an alcohol-based hand rub.  Wash your [facility] your hands often and thoroughly with soap and water for at least 20 seconds. You can use an alcohol-based hand sanitizer if soap and water are not available and if your hands are not visibly dirty. Avoid touching your eyes, nose, and mouth with unwashed hands.   Prevention Steps for Caregivers and Household Members of Individuals Confirmed to have, or Being Evaluated for, COVID-19 Infection Being Cared for in the Home  If you live with, or provide care at home for, a person confirmed to have, or being evaluated for, COVID-19 infection please follow these guidelines to prevent infection:  Follow healthcare providers instructions Make sure that you understand and can help the patient follow any healthcare provider instructions for all care.  Provide for the patients basic needs You should help the patient with basic needs in the home and provide support for getting groceries, prescriptions, and other personal needs.  Monitor the patients symptoms If they are getting sicker, call his or her medical provider and tell them that the patient has, or is being evaluated for, COVID-19 infection. This will help the healthcare providers office  take steps to keep other people from getting infected. °Ask the healthcare provider to call the local or state health department. ° °Limit the number of people who have contact with the patient °If possible, have only one caregiver for the patient. °Other  household members should stay in another home or place of residence. If this is not possible, they should stay °in another room, or be separated from the patient as much as possible. Use a separate bathroom, if available. °Restrict visitors who do not have an essential need to be in the home. ° °Keep older adults, very young children, and other sick people away from the patient °Keep older adults, very young children, and those who have compromised immune systems or chronic health conditions away from the patient. This includes people with chronic heart, lung, or kidney conditions, diabetes, and cancer. ° °Ensure good ventilation °Make sure that shared spaces in the home have good air flow, such as from an air conditioner or an opened window, °weather permitting. ° °Wash your hands often °Wash your hands often and thoroughly with soap and water for at least 20 seconds. You can use an alcohol based hand sanitizer if soap and water are not available and if your hands are not visibly dirty. °Avoid touching your eyes, nose, and mouth with unwashed hands. °Use disposable paper towels to dry your hands. If not available, use dedicated cloth towels and replace them when they become wet. ° °Wear a facemask and gloves °Wear a disposable facemask at all times in the room and gloves when you touch or have contact with the patient’s blood, body fluids, and/or secretions or excretions, such as sweat, saliva, sputum, nasal mucus, vomit, urine, or feces.  Ensure the mask fits over your nose and mouth tightly, and do not touch it during use. °Throw out disposable facemasks and gloves after using them. Do not reuse. °Wash your hands immediately after removing your facemask and gloves. °If your personal clothing becomes contaminated, carefully remove clothing and launder. Wash your hands after handling contaminated clothing. °Place all used disposable facemasks, gloves, and other waste in a lined container before disposing them with  other household waste. °Remove gloves and wash your hands immediately after handling these items. ° °Do not share dishes, glasses, or other household items with the patient °Avoid sharing household items. You should not share dishes, drinking glasses, cups, eating utensils, towels, bedding, or other items with a patient who is confirmed to have, or being evaluated for, COVID-19 infection. °After the person uses these items, you should wash them thoroughly with soap and water. ° °Wash laundry thoroughly °Immediately remove and wash clothes or bedding that have blood, body fluids, and/or secretions or excretions, such as sweat, saliva, sputum, nasal mucus, vomit, urine, or feces, on them. °Wear gloves when handling laundry from the patient. °Read and follow directions on labels of laundry or clothing items and detergent. In general, wash and dry with the warmest temperatures recommended on the label. ° °Clean all areas the individual has used often °Clean all touchable surfaces, such as counters, tabletops, doorknobs, bathroom fixtures, toilets, phones, keyboards, tablets, and bedside tables, every day. Also, clean any surfaces that may have blood, body fluids, and/or secretions or excretions on them. °Wear gloves when cleaning surfaces the patient has come in contact with. °Use a diluted bleach solution (e.g., dilute bleach with 1 part bleach and 10 parts water) or a household disinfectant with a label that says EPA-registered for coronaviruses. To make a bleach   solution at home, add 1 tablespoon of bleach to 1 quart (4 cups) of water. For a larger supply, add  cup of bleach to 1 gallon (16 cups) of water. Read labels of cleaning products and follow recommendations provided on product labels. Labels contain instructions for safe and effective use of the cleaning product including precautions you should take when applying the product, such as wearing gloves or eye protection and making sure you have good ventilation  during use of the product. Remove gloves and wash hands immediately after cleaning.  Monitor yourself for signs and symptoms of illness Caregivers and household members are considered close contacts, should monitor their health, and will be asked to limit movement outside of the home to the extent possible. Follow the monitoring steps for close contacts listed on the symptom monitoring form.   ? If you have additional questions, contact your local health department or call the epidemiologist on call at [phone] (available 24/7). ? This guidance is subject to change. For the most up-to-date guidance from [facility], please refer to their website: YouBlogs.pl

## 2019-10-27 NOTE — Telephone Encounter (Signed)
Noted  Attempted to reach patient via telephone no answer reached her voicemail and left message we would try to reach her again tomorrow.

## 2019-10-27 NOTE — Telephone Encounter (Signed)
Attempted to call patient. No answer. LVM.

## 2019-10-28 MED ORDER — BENZONATATE 200 MG PO CAPS: 200.0000 mg | ORAL_CAPSULE | Freq: Three times a day (TID) | ORAL | 1 refills | Status: DC | PRN

## 2019-10-28 NOTE — Telephone Encounter (Signed)
Pt returned RN call yesterday and left voicemail stating that she had been able to keep foods down and was no longer having diarrhea. Her cough has continued. "Feel like I've had a pretty good day."  Called pt again today. She reports she feels well so far this morning but has not been doing much activity which always worsens her sx. Sts she was still using her cpap after activity yesterday. Doesn't know how long she actually would need it for as she would lie down and take a nap, wake up feeling better. No cough during phone call, only rare throat clearing. She is still using albuterol inhaler every few hours on a mostly regular basis. Offered Tessalon pearles for cough and she accepts. Sent to preferred pharmacy.   She is trying to hydrate more today since she is tolerating fluids better since yesterday. She is unsure of return to work date since positive test and sx changed previous timeline. RN will f/u with HR manager to help determine and request they contact pt. Reminded pt of ER precautions. Continue slow progression of diet as tolerated. She is currently still only eating bland foods or things that sound appetizing to her. Pt denies any further questions/concerns currently.

## 2019-10-28 NOTE — Telephone Encounter (Signed)
Noted Electronic Rx signed for tessalon pearles TID prn use.  Reminder HR asking if health dept has contacted patient and told her when she can resume work also.

## 2019-10-30 ENCOUNTER — Ambulatory Visit (HOSPITAL_COMMUNITY)
Admission: RE | Admit: 2019-10-30 | Discharge: 2019-10-30 | Disposition: A | Discharge status: Home / Self Care | Payer: PRIVATE HEALTH INSURANCE | Source: Other Acute Inpatient Hospital | Attending: Physician Assistant | Admitting: Physician Assistant

## 2019-10-30 ENCOUNTER — Other Ambulatory Visit (HOSPITAL_COMMUNITY): Payer: Self-pay

## 2019-10-30 ENCOUNTER — Ambulatory Visit (HOSPITAL_COMMUNITY)
Admission: RE | Admit: 2019-10-30 | Discharge: 2019-10-30 | Disposition: A | Discharge status: Home / Self Care | Payer: PRIVATE HEALTH INSURANCE | Source: Ambulatory Visit | Attending: Physician Assistant | Admitting: Physician Assistant

## 2019-10-30 DIAGNOSIS — R0602 Shortness of breath: Secondary | ICD-10-CM

## 2019-10-30 DIAGNOSIS — R062 Wheezing: Secondary | ICD-10-CM

## 2019-10-30 DIAGNOSIS — U071 COVID-19: Secondary | ICD-10-CM | POA: Diagnosis present

## 2019-10-30 NOTE — Telephone Encounter (Signed)
Agreed.  Please follow up with patient tomorrow for symptom update and if health dept contacted her.

## 2019-10-30 NOTE — Telephone Encounter (Signed)
NP Betancourt reports that HR manager Hyacinth Meeker reported to her that health dept has not contacted pt to date regarding positive test result and quarantine release.

## 2019-10-31 NOTE — Telephone Encounter (Signed)
Spoke with pt over phone. She heard from Health dept Wednesday and was referred by them to her pcp for quarantine clearance due to continued sx. Reports cough, fatigue, body aches. Voice is hoarse today, worse than previous. Pt reports this has worsened since yesterday. RN noted during phone call that pt can complete some full sentences, but at times will gasp or "choke" on a breath while speaking and has to take a deep breath to finish sentence and avoid a coughing fit.   PCP Rx'd albuterol neb q6h. Pt last used approx 4 hrs ago. Also taking Mucinex for chest congestion. Tessalon pearles prn for cough that she reports some relief with. PCP drew labs when she was seen yesterday. Waiting on results for pcp to give return to work clearance. Pt is frustrated as her sx are changing daily and she feels like she is having a hard time treating the variable sx. Advised she continue nebulizer and OTC meds and alert clinic if any sx worsen or new sx develop over the weekend, and f/u with pcp on Monday if she has not heard back from them by then to determine if additional steroids or other Rx needed. Pt agreeable to plan of care and has no further questions/concerns.

## 2019-11-01 NOTE — Telephone Encounter (Signed)
Noted agree with plan of care 

## 2019-11-01 NOTE — Telephone Encounter (Signed)
Reviewed Epic chest xray states no active cardiopulmonary disease

## 2019-11-01 NOTE — Telephone Encounter (Signed)
Attempted to reach patient via telephone no answer left message that I would call again later today to check on her due to worsening respiratory symptoms yesterday/had chest xray and started nebulizer therapy at home from [facility].

## 2019-11-02 NOTE — Telephone Encounter (Signed)
Telephone message left for patient at 1730 and 1804 reminder she can do albuterol nebulizers every 4 hours and if having difficulty with nebulizer to return to using albuterol inhaler instead.  Epic Care Everywhere reviewed no ER/UR care visits noted today.  Message also specified if worsening SOB/dyspnea/blue lips/face to go to ER for re-evaluation today.  Reminded her that RN Hildred Alamin would be calling her tomorrow.

## 2019-11-02 NOTE — Telephone Encounter (Signed)
Patient had chest xray (no effusion or pneumonia)/PCM evaluation due to breathing has been worse on Friday 10/31/2019 and this weekend.  She is using her niece's nebulizer machine and waiting for hers to arrive that [facility] ordered to perform albuterol nebulizers.  She has performed one nebulizer treatment this morning around 0900 but getting short of breath/gasping with any activity.  Has not worn CPAP after waking up today.  Feels tired.  Denied blue lips/face, wheezing, headache, fever/chills, dizziness, confusion, loss of taste/smell, body aches or n/v/d.  She was able to eat breakfast and last urination upon awakening 0900.  Discussed with patient if worsening breathing symptoms e.g. trouble getting breaths/blue lips or face, confusion, headache she should be re-evaluated by a provider today.  Patient speaking full sentences but typically stopping to take gaspy breaths after 5-7 words.  Patient does not have sp02 monitor at home.  RR 20  I asked her to do another nebulizer after termination of phone call and put on her cpap and try to take a nap and we agreed I would call her back between 17-1800 for recheck.  Patient asked if she was getting enough medicine from child nebulizer and I told her as long as she is breathing through her mouth and inhaling the mist until chamber empty and putting adult dose in chamber she is receiving adult dose.  Discussed this should take 10-15 minutes per treatment.  She should keep her lips wrapped around the end of plastic mouthpiece and breath in and out through her mouth. Patient stated feeling okay otherwise just tired Duration of telephone call 7 minutes.  Patient agreed with plan of care/verbalized understanding information/instructions and had no further questions at this time.

## 2019-11-03 NOTE — Telephone Encounter (Addendum)
Spoke with pt over phone. She reports she is continuing albuterol nebs and other Rx'd meds. Sts basically no changes in condition over weekend or since she last spoke with NP Betancourt. Sts cough is rare. Denies sinusitis sx, nasal or chest congestion, wheezing, HA, fever/chills, dizziness/syncope, confusion, blue lips/face, body aches, lose of taste or smell, GI sx. Sts "this breathing" is her only concern. Sounds similar over phone to how she did when RN spoke to her on Fri 12/4. Hoarse sound to voice, gasps every couple of sentences. Describes it as feeling like she is breathing in normal rhythm, but all of a sudden doesn't have enough air and has to take a quick, deep breath and can continue talking or regular breathing rhythm after that. Notices it worse when talking but occurs at any time.   Has not used nebulizer yet today as she recently woke up. Pcp had labs performed 12/2 then chest xray 12/4 as noted by NP Betancourt. Pt reports only change made by pcp after results is a referral placed to Pulmonology and an extended work letter. Pt is waiting to receive work letter electronically and to hear from pulm office with appt.   Pt has coworker that has taken supplies to pt's house and left them outside door for her previously. Advised pt that RN can send pulse ox monitor for pt to use while she is at home. Pt is agreeable to and appreciative of this. She will advise coworker to pick up from clinic when she comes in on 2nd shift today. Instructions on use and interpretation given to pt, e.g. an accurate reading, nail polish effect, checking before and after neb use and activity. She verbalizes understanding and agreement. Denies questions/concerns.  Pt called RN back and reported her pcp just called her instructing her to proceed to UC for eval since her breathing has not improved over the weekend. Reviewed this with NP over phone. Per verbal orders, RN called pt back and asked how she felt after neb  treatment as she was concerned she was not using it correctly over the weekend. Pt reports treatment last night and this morning have her feeling better than previous. Pt sounds a little less hoarse when speaking and gasps during speaking occurring less frequently. Only heard twice during 4-min conversation instead of 2-3x/min previous phone call.  Pt has decided to come to site to pick up pulse ox today while RN in clinic so reading can be obtained and hopefully avoid UC visit as she would like to. Plan for pt to call RN when she arrives around 1400 and RN will meet pt outside in her vehicle to check O2 sat, listen to lung sounds and give pulse ox to pt. She is agreeable with plan of care and has no further questions/concerns. Will update notes after seeing pt this afternoon.

## 2019-11-03 NOTE — Telephone Encounter (Signed)
Noted.  RN Hildred Alamin did telephone consult with me regarding patient condition unchanged from Friday but unknown patient sp02 RA.  Clinic has one home monitoring unit to loan to employees and Lyvonne will be receiving it later today.  Accuracy has been compared with our medical grade equipment with 4 patients tested and results the same or only 1% difference from clinic BMET maintained equipment.  Patient reported improved symptoms with rest/albuterol neb and patient was given ER precautions/go to UC if worsening despite plan of care.

## 2019-11-03 NOTE — Telephone Encounter (Signed)
Pt called RN approx 1330 and reported that she was unable to come to site at 1400 as planned. She asked coworker to pick up pulse ox monitor and that her daughter will come to parking lot of workplace to get monitor from coworker this evening around 1830. Advised pt since RN is unable to listen to lung sounds or check O2 during clinic hours to contact RN via cell phone/text this evening once she gets monitor and check O2 before and after neb treatment this evening. Reminded if breathing worsens prior to that or O2 sat is less than 90% this evening, she will need to proceed to UC/ER. Pt is agreeable to this.   Pt sts she feels about the same as she did earlier this morning and was about to lie down for a nap and apply Cpap machine. Coworker has picked up monitor from clinic. Will update notes in AM after pt contacts RN this evening.

## 2019-11-06 ENCOUNTER — Telehealth: Payer: Self-pay | Admitting: *Deleted

## 2019-11-06 ENCOUNTER — Institutional Professional Consult (permissible substitution): Payer: No Typology Code available for payment source | Admitting: Pulmonary Disease

## 2019-11-06 MED ORDER — IPRATROPIUM-ALBUTEROL 0.5-2.5 (3) MG/3ML IN SOLN: 3.0000 mL | Freq: Four times a day (QID) | RESPIRATORY_TRACT | 0 refills | Status: DC | PRN

## 2019-11-06 NOTE — Telephone Encounter (Signed)
Spoke with pt over phone. Pt reports pulmonology appt was moved from 12/10 to Monday 12/14 by office. She is concerned that she would run out of nebulizer treatment prior to new appt. Refill placed to preferred pharmacy. She is also asking if there is a stronger neb that she should be on as the past 2 days have been worse for her. She reports staying on CPAP machine through the night and at least 3 hours off and on throughout the day to help with ShOB and gasping.

## 2019-11-06 NOTE — Telephone Encounter (Signed)
Noted please follow up with patient again tomorrow via telephone for symptoms update and Rx electronically signed for albuterol neb refill.

## 2019-11-06 NOTE — Telephone Encounter (Signed)
Called pt back for O2 sat reading. Last used Duoneb nebulizer approx 2 hours ago. O2 sat 93%. Sts she is going to go use CPAP machine and take a nap as she can tell the gasping and ShOB is worsening again.   Pt reports pulm appt was moved by office due to their current office policy requiring 3 weeks between positive Covid test and in-office appointments. Positive test performed on Monday 10/20/19. Appt moved to Monday 11/10/19.

## 2019-11-07 NOTE — Telephone Encounter (Signed)
Spoke with pt over phone. She sounds slightly better today. Voice less raspy than yesterday. Pt reports feeling a little better today overall. Sts she slept late this morning but feels more rested now. She woke up in the middle of the night and checked O2 sat and read 97% while on CPAP machine. She last used nebulizer approx 1 hour ago and sts O2 after that was 95%. Sts she has been needing to use the neb then go back on CPAP machine within 1-2 hours after the last few days but does not feel that she needs to do that today. Gasping still occurring while speaking, once every 1-2 sentences today. Reminded of duoneb refill being available at pharmacy. Reminded of Pulm OV appt date and time for 11/10/19. Also advised pt that NP Betancourt will check in with her via phone this weekend. Pt verbalizes understanding and agreement and denies any questions/concerns. Verbalizes appreciation of close follow up and availability of clinic staff.

## 2019-11-07 NOTE — Telephone Encounter (Signed)
Noted improved sp02 and symptoms respiratory with plan of care.   I will follow up with patient tomorrow via telephone.

## 2019-11-08 NOTE — Telephone Encounter (Signed)
Patient contacted via telephone; duration of telephone call 5 minutes.  Patient only had 1 gasp during entire conversation compared to every sentence earlier this week.  Patient reported she is feeling better and feels her gasping has cut down 50%; last nebulizer treatment today at 1030 when she woke up.  She is taking mucinex every 12 hours.  When waking up feeling congested in chest and coughing up mucous yellow/green some cloudiness thick.  Headache still occurring.  Some mild wheezing before neb this morning.  Went out shopping yesterday for 2 1/2 hours had enough energy finally.  Only used CPAP 1.5 hours yesterday during the day and wearing all night.  SP02 monitor at home 96% this am.  No cough heard during telephone conversation today.  She has in person appt pending with pulmonology Monday 11/10/2019.  She denied fever/chills/n/v/d/rash/loss of taste or smell.  She finally feels like she is on the mend and  has turned a corner.  Reviewed pneumonia symptoms with patient to notify me/pulmonology/RN Hildred Alamin if these occur e.g. fever/chills, tan/rust colored sputum, worsening dyspnea/wheezing/chest discomfort.    Encouraged patient to continue nebulizer treatments every 4-6 hours for next 48 hours while awake.  Discussed with patient I will call her again tomorrow at this time.  Patient agreed with plan of care and had no further questions at this time.

## 2019-11-10 ENCOUNTER — Other Ambulatory Visit: Payer: Self-pay

## 2019-11-10 ENCOUNTER — Ambulatory Visit (INDEPENDENT_AMBULATORY_CARE_PROVIDER_SITE_OTHER): Payer: No Typology Code available for payment source

## 2019-11-10 ENCOUNTER — Encounter: Payer: Self-pay | Admitting: Pulmonary Disease

## 2019-11-10 ENCOUNTER — Ambulatory Visit (INDEPENDENT_AMBULATORY_CARE_PROVIDER_SITE_OTHER): Payer: No Typology Code available for payment source | Admitting: Pulmonary Disease

## 2019-11-10 ENCOUNTER — Encounter: Payer: Self-pay | Admitting: General Surgery

## 2019-11-10 DIAGNOSIS — R066 Hiccough: Secondary | ICD-10-CM | POA: Diagnosis not present

## 2019-11-10 DIAGNOSIS — U071 COVID-19: Secondary | ICD-10-CM | POA: Diagnosis not present

## 2019-11-10 MED ORDER — OMEPRAZOLE 20 MG PO CPDR: 20.0000 mg | DELAYED_RELEASE_CAPSULE | Freq: Every day | ORAL | 11 refills | Status: DC

## 2019-11-10 MED ORDER — GABAPENTIN 100 MG PO CAPS: 100.0000 mg | ORAL_CAPSULE | Freq: Two times a day (BID) | ORAL | 0 refills | Status: DC

## 2019-11-10 NOTE — Telephone Encounter (Signed)
Late entry patient no answer to telephone call yesterday 11/09/2019 to check on her status.  Pulmonology appt pending today 11/10/2019

## 2019-11-10 NOTE — Assessment & Plan Note (Signed)
Appears to have resolved. We will do follow-up chest x-ray for completion. She will be given letter for work since she still has intractable hiccups

## 2019-11-10 NOTE — Telephone Encounter (Signed)
Noted. Great news to hear pt feels she is improving significantly and pulse ox gradually improving to her baselines as well. Will f/u this afternoon with chart review for any new clinic needs as pt scheduled to see pulm at noon today.

## 2019-11-10 NOTE — Assessment & Plan Note (Addendum)
Her main complaint appears to be hiccups rather than shortness of breath.  Fatigue has improved. She does not seem to have ear problems or GI issues or overt reflux.  Chest x-ray is normal, no other reason for phrenic nerve irritation.  No other systemic disease other than recent Covid infection  Use Prilosec 20 mg 1 hour before lunch for 2 weeks  Trial of Neurontin 100 mg twice daily X 10 days #20 If no improvement in 2 days, increase dose to 200 mg twice daily

## 2019-11-10 NOTE — Progress Notes (Signed)
Subjective:    Patient ID: Stephanie Chandler, female    DOB: 1966/03/19, 53 y.o.   MRN: [id number]  HPI  53 year old ex-smoker presents for persistent symptoms post Covid infection. Although the consult was requested for shortness of breath, patient herself denies shortness of breath except when she walks for long distances.  Her main complaint seems to be hiccups-which she describes as periodic gasping breaths.  She was diagnosed with Covid infection 11/25, her daughter may have been her contact.  Her main issues were diarrhea, fatigue, no fevers.  She had residual chest tightness and would hurt to take a deep breath.  She cannot walk for more than 10 minutes without struggling to breathe. She was evaluated in the ED at [facility] on 11/29 and had a saturation of 100%.  She was also evaluated by her PCP and a chest x-ray on 12/3 which I reviewed was clear  She reports significant improvement since then but does have residual fatigue She notes onset of hiccups around 12/6 and this is persisted for the last few days, she had 1 day where she had some relief.  She was given duo nebs but this is really not helped. She denies obvious symptoms of heartburn or sinus drainage.  She denies any ear symptoms and no hearing issues or earwax. She smoked about a pack every 3 days starting as a teenager until she quit 05/2019, about 10 to 15 pack years.   She has been evaluated by [place] for mild OSA diagnosed on a home sleep test in 2016. She then saw neurology and had another sleep study in 2019 and was started on CPAP 10 cm nasal  Significant tests/ events reviewed  08/2015 HST >> 203 lbs -  mild OSA, AHI 9/h  N PSG 06/2018 AHI 11/hour lowest desaturation 81%, supine AHI 30/hour, REM AHI 54/hour CPAP titration >> 10 cm nasal   Past Medical History:  Diagnosis Date  . Arthritis     Past Surgical History:  Procedure Laterality Date  . ABDOMINAL HYSTERECTOMY    . HYSTERECTOMY ABDOMINAL WITH  SALPINGECTOMY  1997    Allergies  Allergen Reactions  . Flagyl [Metronidazole] Hives    Social History   Socioeconomic History  . Marital status: Single    Spouse name: Not on file  . Number of children: Not on file  . Years of education: Not on file  . Highest education level: Not on file  Occupational History  . Not on file  Tobacco Use  . Smoking status: Light Tobacco Smoker    Packs/day: 0.12    Years: 0.50    Pack years: 0.06    Types: Cigarettes  . Smokeless tobacco: Never Used  Substance and Sexual Activity  . Alcohol use: Yes    Alcohol/week: 1.0 standard drinks    Types: 1 Glasses of wine per week    Comment: 1 glass a year  . Drug use: No  . Sexual activity: Never  Other Topics Concern  . Not on file  Social History Narrative  . Not on file   Social Determinants of Health   Financial Resource Strain:   . Difficulty of Paying Living Expenses: Not on file  Food Insecurity:   . Worried About Programme researcher, broadcasting/film/video in the Last Year: Not on file  . Ran Out of Food in the Last Year: Not on file  Transportation Needs:   . Lack of Transportation (Medical): Not on file  . Lack of Transportation (  Non-Medical): Not on file  Physical Activity:   . Days of Exercise per Week: Not on file  . Minutes of Exercise per Session: Not on file  Stress:   . Feeling of Stress : Not on file  Social Connections:   . Frequency of Communication with Friends and Family: Not on file  . Frequency of Social Gatherings with Friends and Family: Not on file  . Attends Religious Services: Not on file  . Active Member of Clubs or Organizations: Not on file  . Attends Archivist Meetings: Not on file  . Marital Status: Not on file  Intimate Partner Violence:   . Fear of Current or Ex-Partner: Not on file  . Emotionally Abused: Not on file  . Physically Abused: Not on file  . Sexually Abused: Not on file      Family History  Problem Relation Age of Onset  . Diabetes  Mother   . Hypertension Mother   . Arthritis Mother   . Diabetes Father   . Kidney disease Father   . Heart disease Maternal Grandmother   . Diabetes Paternal Grandmother       Review of Systems neg for any significant sore throat, dysphagia, itching, sneezing, nasal congestion or excess/ purulent secretions, fever, chills, sweats, unintended wt loss, pleuritic or exertional cp, hempoptysis, orthopnea pnd or change in chronic leg swelling. Also denies presyncope, palpitations, heartburn, abdominal pain, nausea, vomiting, diarrhea or change in bowel or urinary habits, dysuria,hematuria, rash, arthralgias, visual complaints, headache, numbness weakness or ataxia.     Objective:   Physical Exam  Gen. Pleasant, well-nourished, in no distress, normal affect ENT - no pallor,icterus, no post nasal drip Neck: No JVD, no thyromegaly, no carotid bruits Lungs: no use of accessory muscles, no dullness to percussion, clear without rales or rhonchi  Cardiovascular: Rhythm regular, heart sounds  normal, no murmurs or gallops, no peripheral edema Abdomen: soft and non-tender, no hepatosplenomegaly, BS normal. Musculoskeletal: No deformities, no cyanosis or clubbing Neuro:  alert, non focal       Assessment & Plan:

## 2019-11-10 NOTE — Patient Instructions (Signed)
Use Prilosec 20 mg 1 hour before lunch for 2 weeks  Trial of Neurontin 100 mg twice daily X 10 days #20 If no improvement in 2 days, increase dose to 200 mg twice daily

## 2019-11-11 NOTE — Telephone Encounter (Signed)
Noted; reviewed pulmonology note and up to date for hiccup treatment.  Prescribed medications recommended first line.  Up to Date recommends these physical maneuvers also: valsalva, breath holding, sipping cold water, pulling on tongue, gargling with water, wallowing teaspoon dry sugar, pressing on eyeballs gently for vagal stimulation and pulling knees to chest or learning forward to compress chest and relieve pressure on diaphragm as other safe measures to try and disrupt/stop hiccups.  Please continue to follow up with patient until she returns to work daily by telephone.  I will continue to contact patient on days clinic closed Wed, Sat, Sun.

## 2019-11-12 ENCOUNTER — Encounter: Payer: Self-pay | Admitting: *Deleted

## 2019-11-12 NOTE — Telephone Encounter (Signed)
Patient contacted via telephone for symptom update check as still not back at work yet s/p covid 19 infection.  Patient reported she started prilosec and gabapentin from pulmonologist but hasn't seen much of a difference yet.  She is planning to increase gabapentin dose tomorrow and wondering if she should take both at same time or split it am/pm.  Gasping still worsens with prolonged talking.  Patient did not have any gasping at start of call but between 1-2 minutes into conversation it started every other sentence and increased in frequency towards end of call which lasted 13 minutes.  Patient had questions about her diagnosis and new medications.  Discussed with her gabapentin 100mg  should be taken BID.  Discussed half life 5-7 yours.  Discussed per up to date hiccups are known to occur after pharyngitis/influenza and covid virus could be causing similar effect.  Sent her patient handout basic hiccups from up to date her preferred email [email or web address].  Patient is going to try gargling with water, valsalva, sipping ice cold drink, tablespoon sugar, or bringing knees to chest after she hangs up telephone if gasp frequency doesn't decrease with resting voice.  Discussed with patient omeprazole and gabapentin are first line recommended medications for hiccups--both trying to decrease irritation on vagal nerve.  Patient reported wearing CPAP during the day helps her to feel better and decrease gasping.  Patient reported she is unsure she is having hiccups.  Discussed with patient her diaphragm could be having muscle spasms/gotten strained from all the work when she was having trouble breathing and coughing earlier in covid infection course. Patient has appt scheduled with PCM to discuss return to work as if she is having these gasping symptoms she is concerned it will alarm her peers and think she is infectious with covid still.  She doesn't want peers to know she had covid.  I encouraged patient to hydrate,  eat regular meals and try to get 7-8 hours sleep per night to allow her body to recover and heal.  Discussed with patient RN Hildred Alamin would be in clinic tomorrow and Friday if further questions during the day and RN Hildred Alamin would check on her via telephone.  Patient reported sp02 room air 96% today.  She has been trying to increase her activity to build up her stamina again.  Eating well and denied n/v/d. Discussed with patient if her symptoms resolved by Friday I would not be calling her this weekend but if "hiccups and concerns continuing" I will be calling her Sat/Sun to answer any questions she may have.  She is currently scheduled to return to work Monday 21 Dec.   Patient verbalized understanding information/instructions, agreed with plan of care and had no further questions at this time.

## 2019-11-14 NOTE — Telephone Encounter (Signed)
Noted I will follow up with patient via telephone this weekend. 

## 2019-11-14 NOTE — Telephone Encounter (Signed)
Pt contacted by phone. She reports feeling better overall but hiccups still present. She is on the 4th full day of treatment with Prilosec and Gabapentin, which she increased from 100mg  to 200mg  BID on Day 2. She reports hiccups and less frequent and she can carry on longer conversations before they worsen. She had been on the phone an unknown amount of time prior to my call, and our call lasted 9 minutes. Around 6-7 minutes, I could notice an uptick in hiccup frequency from 1 every 3-5 sentences, to 1 every 1-2 sentences. Pt reports the less she talks, the less frequent they occur. She is concerned about how long she should expect the meds to help resolve sx. Advised her if no improvement by 12/23 (1 day remaining on Gabapentin and 4 days remaining on Prilosec) to call Pulm office for advice and/or f/u appt if indicated. Should complete full course of meds by 12/28. Pt is also concerned with returning to work and coworkers thinking she is contagious. She reports having pcp office write an updated work note to extend the pulm work note and is calling HR next to handle that. She has no further questions/concerns at this time.

## 2019-11-16 NOTE — Telephone Encounter (Signed)
Late entry patient did not answer telephone call at 1200 today. Left message I would contact her again at dinner time approximately 1730 to see if she had questions or concerns and discuss her symptoms since Wednesday.  Patient answer my call at 1715.  Duration of call 4 minutes  Patient reported feeling better denied side effects from omeprazole and gabapentin.  Stated "hiccups"improved but still occurring but less frequently. Only heard one gasp this phone call during entire duration.  Spoke full sentences without difficulty no cough heard. She is feeling better.  Planned to be off from work this week returning the following week.  She plans to call her pulmonologist if hiccups still occurring Wednesday this week as she will run out of omeprazole and gabapentin on Thursday and holiday Friday.  Patient stated she feels well and doesn't need [place] to call her daily for check in/questions any longer.  Encouraged patient to continue her medications as prescribed and follow up for re-evaluation if new or worsening symptoms.  Discussed not uncommon for abdomen soreness after chronic hiccups as patient reported she has noticed some soreness but improving each day.  Denied fever/chills/wheezing/n/v/d.  Patient verbalized understanding information/instructions, agreed with plan of care and had no further questions at this time.

## 2019-11-19 ENCOUNTER — Telehealth: Payer: Self-pay | Admitting: Pulmonary Disease

## 2019-11-19 DIAGNOSIS — R066 Hiccough: Secondary | ICD-10-CM

## 2019-11-19 DIAGNOSIS — U071 COVID-19: Secondary | ICD-10-CM

## 2019-11-19 NOTE — Telephone Encounter (Signed)
Spoke with patient. She stated that the gabapentin did help with her hiccups. She is about to run out of the gabapentin and wants to know if she could have a refill sent to [facility] on [address]. She is scheduled to return back to work on Monday 11/24/19 and will not be able to work if she does not have the medication since she is on the phone all day.   RA, please advise if you are ok with [place] sending in a refill of the gabapentin. Thanks!

## 2019-11-19 NOTE — Telephone Encounter (Signed)
Pt returning call.  [phone]

## 2019-11-19 NOTE — Telephone Encounter (Signed)
OK to refill

## 2019-11-19 NOTE — Telephone Encounter (Signed)
ATC pt, no answer. Left message for pt to call back.  

## 2019-11-20 MED ORDER — GABAPENTIN 100 MG PO CAPS: 100.0000 mg | ORAL_CAPSULE | Freq: Two times a day (BID) | ORAL | 1 refills | Status: DC

## 2019-11-20 NOTE — Telephone Encounter (Signed)
Spoke with patient. She is aware of the refill. Will send in enough to last a month. She verbalized understanding.   Nothing further needed.

## 2019-12-18 ENCOUNTER — Encounter: Payer: Self-pay | Admitting: Registered Nurse

## 2019-12-18 ENCOUNTER — Telehealth: Payer: Self-pay | Admitting: Registered Nurse

## 2019-12-18 DIAGNOSIS — M1990 Unspecified osteoarthritis, unspecified site: Secondary | ICD-10-CM

## 2019-12-18 MED ORDER — SALINE SPRAY 0.65 % NA SOLN: 2.0000 | Freq: Every day | NASAL | Status: DC | PRN

## 2019-12-18 MED ORDER — DICLOFENAC SODIUM 75 MG PO TBEC: 75.0000 mg | DELAYED_RELEASE_TABLET | Freq: Two times a day (BID) | ORAL | 0 refills | Status: AC | PRN

## 2019-12-18 NOTE — Telephone Encounter (Signed)
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  She reported she is taking tylenol in addition to diclofenac at the same time with food.  Discussed with patient could have better results alternating tylenol max dose 1000mg  po q6h with diclofenac so as one medication blood level decreasing the other increasing to provide pain coverage.  Patient reported she would try to see if she gets better pain relief that way.  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  Breathing better but still sometimes fatigued.  Discussed to get up every hour and gentle AROM knees/back to help with arthritis also.  Patient reported only taking allergy medication, tylenol, diclofenac at this time.  Patient verbalized understanding information/instructions, agreed with plan of care and had no further questions at this time.

## 2019-12-18 NOTE — Telephone Encounter (Signed)
noted 

## 2019-12-18 NOTE — Telephone Encounter (Signed)
Patient requested refill diclofenac 75mg  DR po BID prn pain.  Ran out last fill from [facility] 09/22/2019 patient reported not taking every day.  Refilled today from [facility] formulary for patient diclofenac 75mg  DR po BID prn pain #60 RF0.  Patient to notify [facility] when she needs another fill.  Cold weather exacerbating arthritis.

## 2019-12-18 NOTE — Telephone Encounter (Signed)
Pt requesting refill of Diclofenac. Last filled with 30 day BID supply on 09/22/19. Pt was to f/u after last refill with update on pain improvement and to evaluate need for refills. Pt went out of work 2/2 Covid prior to f/u being completed.   Sts she uses Diclofenac prn when arthritis pain is flaring. Now having "a really bad flare" and is taking BID. Almost out of med. Sts she is not taking any other NSAIDs with Diclofenac. Only Tylenol prn.   Refill request placed in paper chart to be filled through [facility]. Diclofenac 75mg  po BID prn pain #60 RF0.

## 2019-12-20 ENCOUNTER — Other Ambulatory Visit: Payer: Self-pay | Admitting: Registered Nurse

## 2019-12-20 ENCOUNTER — Encounter: Payer: Self-pay | Admitting: Registered Nurse

## 2019-12-20 NOTE — Telephone Encounter (Signed)
Attempted to reach patient by telephone no answer and voicemail full to verify she does need duoneb solution.  I spoke with patient on Thursday and she did not tell me she was still using her nebulizer after acute illness resolved.  Patient had requested refill on diclofenac for arthritis pain at that time and med list reviewed with patient.

## 2019-12-24 NOTE — Telephone Encounter (Signed)
Late entry  RN Rolly Salter left message for patient to contact clinic on 12/23/2019 to verify if she needs refill/has run out of albuterol

## 2019-12-25 NOTE — Telephone Encounter (Signed)
Telephone message left for patient to contact clinic to verify if she is using duoneb solution still and if refill requested or if this was autopharmacy generated.

## 2019-12-28 NOTE — Telephone Encounter (Signed)
Telephone message left for patient to let me know via email at [email or web address] or [phone] if she needs refill and how often she is using duoneb if refill needed.

## 2020-01-23 ENCOUNTER — Ambulatory Visit: Payer: Self-pay | Admitting: *Deleted

## 2020-01-23 ENCOUNTER — Other Ambulatory Visit: Payer: Self-pay

## 2020-01-23 VITALS — BP 125/93 | HR 81

## 2020-01-23 DIAGNOSIS — B373 Candidiasis of vulva and vagina: Secondary | ICD-10-CM

## 2020-01-23 DIAGNOSIS — R300 Dysuria: Secondary | ICD-10-CM

## 2020-01-23 DIAGNOSIS — B3731 Acute candidiasis of vulva and vagina: Secondary | ICD-10-CM

## 2020-01-23 LAB — POCT URINALYSIS DIPSTICK
Blood, UA: NEGATIVE
Glucose, UA: NEGATIVE mg/dL
Ketones, POC UA: NEGATIVE mg/dL
Leukocytes, UA: NEGATIVE
Nitrite, UA: NEGATIVE
Specific Gravity, UA: 1.03 (ref 1.005–1.030)
pH, UA: 6 (ref 5.0–8.0)

## 2020-01-23 MED ORDER — FLUCONAZOLE 150 MG PO TABS: 150.0000 mg | ORAL_TABLET | Freq: Once | ORAL | 0 refills | Status: AC

## 2020-01-23 NOTE — Progress Notes (Signed)
Pt c/o burning with urination and vaginal and labial skin irritation with redness, raw feeling, burning x1 week. Denies vaginal discharge. Feels similar to previous yeast infections. R/o UTI with negative UA dipstick. Standing diflucan order given verbally by NP Betancourt yesterday during clinic hours while awaiting return call from pt after not being able to reach during clinic hours.   Pt reports she has changed her diet in the past 2 weeks and is drinking a lot of vegetable and fruit juice in place of some meals. Feels this was cause of sx. Denies recent abx use.   Rxsent to pharmacy of choice for Diflucan 150mg  po once #1 RF0. Also advised pt to hydrate well to keep urine clear to pale yellow. Pt advised to contact clinic on Monday if sx worsen or fail to improve. She denies any further questions/concerns.

## 2020-01-26 NOTE — Progress Notes (Signed)
Noted. Advised pt at time of POC testing that she can return in 2 weeks and repeat testing to ensure protein has cleared. She verbalized agreement with this. Left VM on pt's individual work phone line requesting symptom update post one dose Diflucan. Awaiting response.

## 2020-01-26 NOTE — Progress Notes (Signed)
Noted agree with plan of care 

## 2020-01-29 ENCOUNTER — Telehealth: Payer: Self-pay | Admitting: *Deleted

## 2020-01-29 MED ORDER — FLUCONAZOLE 150 MG PO TABS: 150.0000 mg | ORAL_TABLET | Freq: Once | ORAL | 0 refills | Status: AC

## 2020-01-29 NOTE — Telephone Encounter (Signed)
Electronic Rx signed.

## 2020-01-29 NOTE — Telephone Encounter (Signed)
Noted agreed with plan of care and discussed plan verbally with RN Rolly Salter.  Patient to follow up if no improvement with plan of care.

## 2020-01-29 NOTE — Telephone Encounter (Signed)
Pt called clinic reporting vaginal yeast infection sx are improving but not yet resolved. Requesting 2nd round of Diflucan. Verbal order received from NP Betancourt. Rx placed to pharmacy of choice.

## 2020-01-30 NOTE — Progress Notes (Signed)
Pt reported yesterday 3/4 that sx had improved but resolved completely. Additional Diflucan 150mg  po once sent to pharmacy of choice per NP Betancourt verbal order.

## 2020-02-01 NOTE — Progress Notes (Signed)
Symptoms improved but not resolved completely.  Repeating dose diflucan 150mg  po x1 now #1 RF0 sent electronically to her pharmacy of choice.

## 2020-02-03 NOTE — Progress Notes (Signed)
Spoke with pt over phone. She reports that sx are almost fully resolved after 2nd dose Diflucan. Only mild irritation now. She does not believe she needs additional medication/treatment at this time. Sts she is going to push fluids more. Advised pt that clinic is closed tomorrow (Wednesdays) and to contact RN Thursday morning if any sx are still persisting. She verbalizes understanding and agreement. No further questions/concerns.

## 2020-02-10 ENCOUNTER — Encounter: Payer: Self-pay | Admitting: *Deleted

## 2020-02-10 NOTE — Telephone Encounter (Signed)
Noted  

## 2020-02-10 NOTE — Telephone Encounter (Signed)
Noted. Email sent to pt requesting day preference of lab draw. Plan to schedule early June 2021. Awaiting response.

## 2020-02-10 NOTE — Telephone Encounter (Signed)
Patient last filled 12/18/2019 diclofenac 75mg  po BID #60 Rf0  Patient reported working well for her and would like to continue usage.  Refilled for patient today from [facility].  Last labs 06/13/2019  Per epocrates due to long term use diclofenac needs cbc and cmet due annual exec panel Jul 2021 will combine and RN Aug 2021 to schedule with patient.  Reminded patient avoid OTC advil/aleve/motrin/ibuprofen/naproxen/naprosyn use also while taking diclofenac.  Take with food to avoid stomach irritation.    Patient verbalized understanding information/instructions, agreed with plan of care and had no further questions at this time.

## 2020-03-25 ENCOUNTER — Encounter: Payer: Self-pay | Admitting: Registered Nurse

## 2020-03-25 ENCOUNTER — Ambulatory Visit: Payer: Self-pay | Admitting: *Deleted

## 2020-03-25 ENCOUNTER — Other Ambulatory Visit: Payer: Self-pay

## 2020-03-25 ENCOUNTER — Telehealth: Payer: Self-pay | Admitting: Registered Nurse

## 2020-03-25 VITALS — BP 115/85 | HR 87

## 2020-03-25 DIAGNOSIS — Z0000 Encounter for general adult medical examination without abnormal findings: Secondary | ICD-10-CM

## 2020-03-25 MED ORDER — DICLOFENAC SODIUM 75 MG PO TBEC: 75.0000 mg | DELAYED_RELEASE_TABLET | Freq: Two times a day (BID) | ORAL | 0 refills | Status: AC | PRN

## 2020-03-25 NOTE — Progress Notes (Signed)
Fasting labs ahead of pcp CPE next month.

## 2020-03-25 NOTE — Telephone Encounter (Signed)
Patient last filled 02/10/2020 diclofenac 75mg  po BID prn pain #60 RF0  Patient reported working well for her and would like to continue usage.  Refilled for patient today from [facility].  Last labs 06/13/2019.  New labs drawn this am results pending from [facility].  Per epocrates due to long term use diclofenac needs cbc and cmet due annual exec panel Jul 2021 combined blood draw.  Reminded patient avoid OTC advil/aleve/motrin/ibuprofen/naproxen/naprosyn use also while taking diclofenac.  Take with food to avoid stomach irritation.    Patient verbalized understanding information/instructions, agreed with plan of care and had no further questions at this time.

## 2020-03-26 LAB — CMP12+LP+TP+TSH+6AC+CBC/D/PLT
ALT: 37 IU/L — ABNORMAL HIGH (ref 0–32)
AST: 27 IU/L (ref 0–40)
Albumin/Globulin Ratio: 1.5 (ref 1.2–2.2)
Albumin: 4.3 g/dL (ref 3.8–4.9)
Alkaline Phosphatase: 68 IU/L (ref 39–117)
BUN/Creatinine Ratio: 17 (ref 9–23)
BUN: 13 mg/dL (ref 6–24)
Basophils Absolute: 0 10*3/uL (ref 0.0–0.2)
Basos: 1 %
Bilirubin Total: 0.2 mg/dL (ref 0.0–1.2)
Calcium: 9.3 mg/dL (ref 8.7–10.2)
Chloride: 103 mmol/L (ref 96–106)
Chol/HDL Ratio: 4.1 ratio (ref 0.0–4.4)
Cholesterol, Total: 193 mg/dL (ref 100–199)
Creatinine, Ser: 0.76 mg/dL (ref 0.57–1.00)
EOS (ABSOLUTE): 0.2 10*3/uL (ref 0.0–0.4)
Eos: 3 %
Estimated CHD Risk: 0.9 times avg. (ref 0.0–1.0)
Free Thyroxine Index: 2 (ref 1.2–4.9)
GFR calc Af Amer: 103 mL/min/{1.73_m2} (ref >59)
GFR calc non Af Amer: 89 mL/min/{1.73_m2} (ref >59)
GGT: 58 IU/L (ref 0–60)
Globulin, Total: 2.8 g/dL (ref 1.5–4.5)
Glucose: 140 mg/dL — ABNORMAL HIGH (ref 65–99)
HDL: 47 mg/dL (ref >39)
Hematocrit: 39.7 % (ref 34.0–46.6)
Hemoglobin: 13.3 g/dL (ref 11.1–15.9)
Immature Grans (Abs): 0 10*3/uL (ref 0.0–0.1)
Immature Granulocytes: 0 %
Iron: 71 ug/dL (ref 27–159)
LDH: 213 IU/L (ref 119–226)
LDL Chol Calc (NIH): 132 mg/dL — ABNORMAL HIGH (ref 0–99)
Lymphocytes Absolute: 1.7 10*3/uL (ref 0.7–3.1)
Lymphs: 30 %
MCH: 30.5 pg (ref 26.6–33.0)
MCHC: 33.5 g/dL (ref 31.5–35.7)
MCV: 91 fL (ref 79–97)
Monocytes Absolute: 0.5 10*3/uL (ref 0.1–0.9)
Monocytes: 9 %
Neutrophils Absolute: 3.3 10*3/uL (ref 1.4–7.0)
Neutrophils: 57 %
Phosphorus: 3 mg/dL (ref 3.0–4.3)
Platelets: 316 10*3/uL (ref 150–450)
Potassium: 4.2 mmol/L (ref 3.5–5.2)
RBC: 4.36 x10E6/uL (ref 3.77–5.28)
RDW: 13 % (ref 11.7–15.4)
Sodium: 139 mmol/L (ref 134–144)
T3 Uptake Ratio: 28 % (ref 24–39)
T4, Total: 7 ug/dL (ref 4.5–12.0)
TSH: 1.84 u[IU]/mL (ref 0.450–4.500)
Total Protein: 7.1 g/dL (ref 6.0–8.5)
Triglycerides: 79 mg/dL (ref 0–149)
Uric Acid: 4.1 mg/dL (ref 3.0–7.2)
VLDL Cholesterol Cal: 14 mg/dL (ref 5–40)
WBC: 5.7 10*3/uL (ref 3.4–10.8)

## 2020-03-26 LAB — HGB A1C W/O EAG: Hgb A1c MFr Bld: 6.4 % — ABNORMAL HIGH (ref 4.8–5.6)

## 2020-03-26 NOTE — Progress Notes (Signed)
Results reviewed with pt. A1c and spot glucose elevated. Pt was fasting at time of draw. LDL also increased from previous. She reports juicing and drinking smoothies for most meals over the past 2-3 months. She was using a lot fruits with natural sugars, bananas, strawberries, blueberries, with spinach and yogurt. Advised her the natural sugars could be increasing her glucose. She has started moving back to a normal diet. Advised her to discuss with her pcp their recommendations for her diet and rechecking labs after diet changes.   Diet and exercise recommendations discussed re: LDL, A1c, wt management. Routine f/u with pcp. Copy of labs provided to pt. Results routed to pcp per pt request. No further questions/concerns.

## 2020-03-26 NOTE — Progress Notes (Signed)
Spoke with pt over phone. Confirmed with pt etoh use. Has increased recently. Endorses 3-4 glasses of glasses every other weekend. Recommended she decrease this to 1-2 glasses. Did previously advise that elevated ALT could be related to Diclofenac use. Scheduled pt to repeat A1c in 3 months. Pt denies any further questions/concerns.

## 2020-03-26 NOTE — Progress Notes (Signed)
Attempted to contact pt to review results. No answer. LVMRCB.

## 2020-03-31 NOTE — Telephone Encounter (Signed)
Patient had labs 03/25/2020 and saw PCM last week who requested BNP drawn.  RN Rolly Salter notified to check if can be drawn at [facility] per patient request yesterday and she is to contact patient to schedule or notify her she will need to have drawn at [facility] office.  Patient has received diclofenac 75mg  po BID prn pain refill from [facility] this week also.

## 2020-04-01 ENCOUNTER — Ambulatory Visit: Payer: Self-pay | Admitting: *Deleted

## 2020-04-01 ENCOUNTER — Other Ambulatory Visit: Payer: Self-pay

## 2020-04-01 DIAGNOSIS — M7989 Other specified soft tissue disorders: Secondary | ICD-10-CM

## 2020-04-01 NOTE — Progress Notes (Signed)
BNP per pcp orders.

## 2020-04-02 LAB — BRAIN NATRIURETIC PEPTIDE: BNP: 24.3 pg/mL (ref 0.0–100.0)

## 2020-04-08 NOTE — Progress Notes (Signed)
Results were forwarded to pcp at pt request. Result reviewed with pt over phone 04/02/20. She denied any questions or concerns.

## 2020-06-07 ENCOUNTER — Telehealth: Payer: Self-pay | Admitting: *Deleted

## 2020-06-07 MED ORDER — FLUCONAZOLE 150 MG PO TABS: 150.0000 mg | ORAL_TABLET | Freq: Once | ORAL | 1 refills | Status: AC

## 2020-06-07 NOTE — Telephone Encounter (Signed)
Pt called clinic reporting sx similar to previous vaginal yeast infection. C/o redness, swelling, irritation, itching. Reports small amount of watery discharge, no odor yet. Feels that this is progressing to yeast infection and requests Diflucan to treat. Has taken Diflucan previously with abx use due to frequent yeast infections. Denies any recent abx use however was on vacation last week and reports swimming daily, wet clothing and feels sx are r/t to this. Rx placed for Diflucan 150mg  po once #1 with 1 refill to repeat in 1 week if sx persist. If refill is needed, f/u with clinic to re-evaluate sx. Pt verbalizes understanding and agreement with plan of care. No further questions/concerns.

## 2020-06-08 NOTE — Telephone Encounter (Signed)
Noted agreed with plan of care electronic Rx signed and patient to follow up if no improvement with symptoms.  Notify patient we are unable to perform pelvic exam at [facility] and would recommend gyn/PCM as they would be able to perform wet prep/slide typically

## 2020-06-24 ENCOUNTER — Other Ambulatory Visit: Payer: Self-pay

## 2020-06-24 ENCOUNTER — Ambulatory Visit: Payer: Self-pay | Admitting: *Deleted

## 2020-06-24 DIAGNOSIS — R1010 Upper abdominal pain, unspecified: Secondary | ICD-10-CM

## 2020-06-24 NOTE — Progress Notes (Signed)
Labs per PCP orders in paper chart.

## 2020-06-25 LAB — CMP12+LP+TP+TSH+6AC+CBC/D/PLT
ALT: 56 IU/L — ABNORMAL HIGH (ref 0–32)
AST: 43 IU/L — ABNORMAL HIGH (ref 0–40)
Albumin/Globulin Ratio: 1.7 (ref 1.2–2.2)
Albumin: 4.2 g/dL (ref 3.8–4.9)
Alkaline Phosphatase: 65 IU/L (ref 48–121)
BUN/Creatinine Ratio: 14 (ref 9–23)
BUN: 10 mg/dL (ref 6–24)
Basophils Absolute: 0 10*3/uL (ref 0.0–0.2)
Basos: 1 %
Bilirubin Total: 0.3 mg/dL (ref 0.0–1.2)
Calcium: 9 mg/dL (ref 8.7–10.2)
Chloride: 105 mmol/L (ref 96–106)
Chol/HDL Ratio: 4.6 ratio — ABNORMAL HIGH (ref 0.0–4.4)
Cholesterol, Total: 185 mg/dL (ref 100–199)
Creatinine, Ser: 0.7 mg/dL (ref 0.57–1.00)
EOS (ABSOLUTE): 0.1 10*3/uL (ref 0.0–0.4)
Eos: 3 %
Estimated CHD Risk: 1.2 times avg. — ABNORMAL HIGH (ref 0.0–1.0)
Free Thyroxine Index: 2.1 (ref 1.2–4.9)
GFR calc Af Amer: 113 mL/min/{1.73_m2} (ref >59)
GFR calc non Af Amer: 98 mL/min/{1.73_m2} (ref >59)
GGT: 64 IU/L — ABNORMAL HIGH (ref 0–60)
Globulin, Total: 2.5 g/dL (ref 1.5–4.5)
Glucose: 125 mg/dL — ABNORMAL HIGH (ref 65–99)
HDL: 40 mg/dL (ref >39)
Hematocrit: 38.3 % (ref 34.0–46.6)
Hemoglobin: 13 g/dL (ref 11.1–15.9)
Immature Grans (Abs): 0 10*3/uL (ref 0.0–0.1)
Immature Granulocytes: 0 %
Iron: 101 ug/dL (ref 27–159)
LDH: 205 IU/L (ref 119–226)
LDL Chol Calc (NIH): 127 mg/dL — ABNORMAL HIGH (ref 0–99)
Lymphocytes Absolute: 1.8 10*3/uL (ref 0.7–3.1)
Lymphs: 44 %
MCH: 30.3 pg (ref 26.6–33.0)
MCHC: 33.9 g/dL (ref 31.5–35.7)
MCV: 89 fL (ref 79–97)
Monocytes Absolute: 0.5 10*3/uL (ref 0.1–0.9)
Monocytes: 12 %
Neutrophils Absolute: 1.7 10*3/uL (ref 1.4–7.0)
Neutrophils: 40 %
Phosphorus: 2.4 mg/dL — ABNORMAL LOW (ref 3.0–4.3)
Platelets: 269 10*3/uL (ref 150–450)
Potassium: 3.9 mmol/L (ref 3.5–5.2)
RBC: 4.29 x10E6/uL (ref 3.77–5.28)
RDW: 12.7 % (ref 11.7–15.4)
Sodium: 139 mmol/L (ref 134–144)
T3 Uptake Ratio: 29 % (ref 24–39)
T4, Total: 7.2 ug/dL (ref 4.5–12.0)
TSH: 0.763 u[IU]/mL (ref 0.450–4.500)
Total Protein: 6.7 g/dL (ref 6.0–8.5)
Triglycerides: 101 mg/dL (ref 0–149)
Uric Acid: 4.1 mg/dL (ref 3.0–7.2)
VLDL Cholesterol Cal: 18 mg/dL (ref 5–40)
WBC: 4.1 10*3/uL (ref 3.4–10.8)

## 2020-06-25 LAB — LIPASE: Lipase: 20 U/L (ref 14–72)

## 2020-06-25 LAB — HGB A1C W/O EAG: Hgb A1c MFr Bld: 6.5 % — ABNORMAL HIGH (ref 4.8–5.6)

## 2020-07-29 NOTE — Telephone Encounter (Signed)
Followed up with pt by phone approx one week after completing Diflucan dosing. She reported all sx had resolved at that time and had no further questions.

## 2020-07-29 NOTE — Telephone Encounter (Signed)
Noted symptoms resolved after diflucan oral

## 2020-09-10 ENCOUNTER — Telehealth: Payer: Self-pay | Admitting: *Deleted

## 2020-09-10 ENCOUNTER — Encounter: Payer: Self-pay | Admitting: *Deleted

## 2020-09-10 DIAGNOSIS — Z6841 Body Mass Index (BMI) 40.0 and over, adult: Secondary | ICD-10-CM

## 2020-09-10 DIAGNOSIS — R739 Hyperglycemia, unspecified: Secondary | ICD-10-CM

## 2020-09-10 DIAGNOSIS — E66813 Obesity, class 3: Secondary | ICD-10-CM

## 2020-09-10 MED ORDER — FLUCONAZOLE 150 MG PO TABS: 150.0000 mg | ORAL_TABLET | Freq: Once | ORAL | 1 refills | Status: AC

## 2020-09-10 MED ORDER — FLUCONAZOLE 150 MG PO TABS: 150.0000 mg | ORAL_TABLET | Freq: Once | ORAL | 0 refills | Status: DC

## 2020-09-10 NOTE — Telephone Encounter (Signed)
Pt c/o yeast infection sx x3 days. C/o vaginal irritation, itching, burning. Denies dysuria. Feels r/t recent diet changes. Denies recent abx, swimming, etc. Typically requires two doses Diflucan to clear sx. Rx entered to pharmacy of choice for Diflucan 150mg  po 1 tab once RF1.

## 2020-09-10 NOTE — Telephone Encounter (Signed)
E-Rxing down across the system. VM left on Pharmacy provider line calling in Rx with RN cell # left as follow up # if needed.

## 2020-09-10 NOTE — Telephone Encounter (Signed)
Reviewed RN Rolly Salter note.  Last diflucan use Jul 2021. Third Rx for this year and no recent antibiotic rx per epic review.   Labs 06/24/2020 Hgba1c 6.5 CBC WNL elevated LFTs and taking diclofenac/overweight.  Last gyn appt unable to determine from Epic review.  Please verify with patient when her last PAP/pelvic exam was completed.  My chart message sent to patient also to verify if recent antibiotics, new sexual partner or if stress/less sleep than usual recently.  Diflucan 150mg  po x 1 now and may repeat in 72 hours #2 RF0 signed for patient today.  RN called in Rx to pharmacy as e-prescribing feature in epic currently not operable/functioning.  Patient to follow up with gyn/PCM as pelvic exam/PAP not available at [facility] if no improvement with diflucan 2 doses.

## 2020-09-16 NOTE — Telephone Encounter (Signed)
Spoke with patient today.  Symptoms resolved and she needs to schedule quarterly Hgba1c with RN Rolly Salter as last one in July.  Agree with plan of care to see gyn/women's health for routine appt recommended.  Patient verbalized understanding information/instructions, agreed with plan of care and no further questions at this time.

## 2020-09-16 NOTE — Telephone Encounter (Signed)
Pt reports stress increase and not sleeping as well as usual, Father sick and this is affecting her. No recent abx. Also endorses new sexual partner in the past 3 months. Reports she stress eats and has had increased soda, candy/sweets intake. Advised A1c could be increasing and this puts her at higher risk for yeast infections.  Has not had a Gyn appt/pelvic exam/PAP smear in at least 3 years. Sts she needs to find a new provider and will work on that soon. She reports that her sx resolved after one dose Diflucan, did not need 2nd dose this time.

## 2020-10-26 ENCOUNTER — Ambulatory Visit: Payer: Self-pay | Admitting: Registered Nurse

## 2020-10-26 ENCOUNTER — Encounter: Payer: Self-pay | Admitting: Registered Nurse

## 2020-10-26 ENCOUNTER — Other Ambulatory Visit: Payer: Self-pay

## 2020-10-26 VITALS — BP 154/92 | HR 78 | Temp 98.2°F | Resp 16 | Ht 61.0 in | Wt 212.0 lb

## 2020-10-26 DIAGNOSIS — Z6841 Body Mass Index (BMI) 40.0 and over, adult: Secondary | ICD-10-CM

## 2020-10-26 DIAGNOSIS — J019 Acute sinusitis, unspecified: Secondary | ICD-10-CM

## 2020-10-26 DIAGNOSIS — R030 Elevated blood-pressure reading, without diagnosis of hypertension: Secondary | ICD-10-CM

## 2020-10-26 DIAGNOSIS — H6983 Other specified disorders of Eustachian tube, bilateral: Secondary | ICD-10-CM

## 2020-10-26 MED ORDER — FLUCONAZOLE 150 MG PO TABS: 150.0000 mg | ORAL_TABLET | Freq: Once | ORAL | 0 refills | Status: AC

## 2020-10-26 MED ORDER — AMOXICILLIN 875 MG PO TABS: 875.0000 mg | ORAL_TABLET | Freq: Two times a day (BID) | ORAL | 0 refills | Status: DC

## 2020-10-26 MED ORDER — PREDNISONE 10 MG (21) PO TBPK: ORAL_TABLET | ORAL | 0 refills | Status: DC

## 2020-10-26 NOTE — Patient Instructions (Addendum)
Schedule flu vaccine  Allergic Rhinitis, Adult Allergic rhinitis is an allergic reaction that affects the mucous membrane inside the nose. It causes sneezing, a runny or stuffy nose, and the feeling of mucus going down the back of the throat (postnasal drip). Allergic rhinitis can be mild to severe. There are two types of allergic rhinitis:  Seasonal. This type is also called hay fever. It happens only during certain seasons.  Perennial. This type can happen at any time of the year. What are the causes? This condition happens when the body's defense system (immune system) responds to certain harmless substances called allergens as though they were germs.  Seasonal allergic rhinitis is triggered by pollen, which can come from grasses, trees, and weeds. Perennial allergic rhinitis may be caused by:  House dust mites.  Pet dander.  Mold spores. What are the signs or symptoms? Symptoms of this condition include:  Sneezing.  Runny or stuffy nose (nasal congestion).  Postnasal drip.  Itchy nose.  Tearing of the eyes.  Trouble sleeping.  Daytime sleepiness. How is this diagnosed? This condition may be diagnosed based on:  Your medical history.  A physical exam.  Tests to check for related conditions, such as: ? Asthma. ? Pink eye. ? Ear infection. ? Upper respiratory infection.  Tests to find out which allergens trigger your symptoms. These may include skin or blood tests. How is this treated? There is no cure for this condition, but treatment can help control symptoms. Treatment may include:  Taking medicines that block allergy symptoms, such as antihistamines. Medicine may be given as a shot, nasal spray, or pill.  Avoiding the allergen.  Desensitization. This treatment involves getting ongoing shots until your body becomes less sensitive to the allergen. This treatment may be done if other treatments do not help.  If taking medicine and avoiding the allergen does  not work, new, stronger medicines may be prescribed. Follow these instructions at home:  Find out what you are allergic to. Common allergens include smoke, dust, and pollen.  Avoid the things you are allergic to. These are some things you can do to help avoid allergens: ? Replace carpet with wood, tile, or vinyl flooring. Carpet can trap dander and dust. ? Do not smoke. Do not allow smoking in your home. ? Change your heating and air conditioning filter at least once a month. ? During allergy season:  Keep windows closed as much as possible.  Plan outdoor activities when pollen counts are lowest. This is usually during the evening hours.  When coming indoors, change clothing and shower before sitting on furniture or bedding.  Take over-the-counter and prescription medicines only as told by your health care provider.  Keep all follow-up visits as told by your health care provider. This is important. Contact a health care provider if:  You have a fever.  You develop a persistent cough.  You make whistling sounds when you breathe (you wheeze).  Your symptoms interfere with your normal daily activities. Get help right away if:  You have shortness of breath. Summary  This condition can be managed by taking medicines as directed and avoiding allergens.  Contact your health care provider if you develop a persistent cough or fever.  During allergy season, keep windows closed as much as possible. This information is not intended to replace advice given to you by your health care provider. Make sure you discuss any questions you have with your health care provider. Document Revised: 10/26/2017 Document Reviewed: 12/21/2016 Elsevier  Patient Education  [employer]. Nonallergic Rhinitis Nonallergic rhinitis is a condition that causes symptoms that affect the nose, such as a runny nose and a stuffed-up nose (nasal congestion) that can make it hard to breathe through the nose. This  condition is different from having an allergy (allergic rhinitis). Allergic rhinitis occurs when the body's defense system (immune system) reacts to a substance that you are allergic to (allergen), such as pollen, pet dander, mold, or dust. Nonallergic rhinitis has many similar symptoms, but it is not caused by allergens. Nonallergic rhinitis can be a short-term or long-term problem. What are the causes? This condition can be caused by many different things. Some common types of nonallergic rhinitis include: Infectious rhinitis  This is usually due to an infection in the upper respiratory tract. Vasomotor rhinitis  This is the most common type of long-term nonallergic rhinitis.  It is caused by too much blood flow through the nose, which makes the tissue inside of the nose swell.  Symptoms are often triggered by strong odors, cold air, stress, drinking alcohol, cigarette smoke, or changes in the weather. Occupational rhinitis  This type is caused by triggers in the workplace, such as chemicals, dusts, animal dander, or air pollution. Hormonal rhinitis  This type occurs in women as a result of an increase in the female hormone estrogen.  It may occur during pregnancy, puberty, and menstrual cycles.  Symptoms improve when estrogen levels drop. Drug-induced rhinitis Several drugs can cause nonallergic rhinitis, including:  Medicines that are used to treat high blood pressure, heart disease, and Parkinson disease.  Aspirin and NSAIDs.  Over-the-counter nasal decongestant sprays. These can cause a type of nonallergic rhinitis (rhinitis medicamentosa) when they are used for more than a few days. Nonallergic rhinitis with eosinophilia syndrome (NARES)  This type is caused by having too much of a certain type of white blood cell (eosinophil). Nonallergic rhinitis can also be caused by a reaction to eating hot or spicy foods. This does not usually cause long-term symptoms. In some cases, the  cause of nonallergic rhinitis is not known. What increases the risk? You are more likely to develop this condition if:  You are 8-80 years of age.  You are a woman. Women are twice as likely to have this condition. What are the signs or symptoms? Common symptoms of this condition include:  Nasal congestion.  Runny nose.  The feeling of mucus going down the back of the throat (postnasal drip).  Trouble sleeping at night and daytime sleepiness. Less common symptoms include:  Sneezing.  Coughing.  Itchy nose.  Bloodshot eyes. How is this diagnosed? This condition may be diagnosed based on:  Your symptoms and medical history.  A physical exam.  Allergy testing to rule out allergic rhinitis. You may have skin tests or blood tests. In some cases, the health care provider may take a swab of nasal secretions to look for an increased number of eosinophils. This would be done to confirm a diagnosis of NARES. How is this treated? Treatment for this condition depends on the cause. No single treatment works for everyone. Work with your health care provider to find the best treatment for you. Treatment may include:  Avoiding the things that trigger your symptoms.  Using medicines to relieve congestion, such as: ? Steroid nasal spray. There are many types. You may need to try a few to find out which one works best. ? Decongestant medicine. This may be an oral medicine or a nasal  spray. These medicines are only used for a short time.  Using medicines to relieve a runny nose. These may include antihistamine medicines or anticholinergic nasal sprays.  Surgery to remove tissue from inside the nose may be needed in severe cases if the condition has not improved after 6-12 months of medical treatment. Follow these instructions at home:  Take or use over-the-counter and prescription medicines only as told by your health care provider. Do not stop using your medicine even if you start to  feel better.  Use salt-water (saline) rinses or other solutions (nasal washes or irrigations) to wash or rinse out the inside of your nose as told by your health care provider.  Do not take NSAIDs or medicines that contain aspirin if they make your symptoms worse.  Do not drink alcohol if it makes your symptoms worse.  Do not use any tobacco products, such as cigarettes, chewing tobacco, and e-cigarettes. If you need help quitting, ask your health care provider.  Avoid secondhand smoke.  Get some exercise every day. Exercise may help reduce symptoms of nonallergic rhinitis for some people. Ask your health care provider how much exercise and what types of exercise are safe for you.  Sleep with the head of your bed raised (elevated). This may reduce nighttime nasal congestion.  Keep all follow-up visits as told by your health care provider. This is important. Contact a health care provider if:  You have a fever.  Your symptoms are getting worse at home.  Your symptoms are not responding to medicine.  You develop new symptoms, especially a headache or nosebleed. This information is not intended to replace advice given to you by your health care provider. Make sure you discuss any questions you have with your health care provider. Document Revised: 10/26/2017 Document Reviewed: 02/03/2016 Elsevier Patient Education  2020 [employer]. Otitis Media, Adult  Otitis media occurs when there is inflammation and fluid in the middle ear. Your middle ear is a part of the ear that contains bones for hearing as well as air that helps send sounds to your brain. What are the causes? This condition is caused by a blockage in the eustachian tube. This tube drains fluid from the ear to the back of the nose (nasopharynx). A blockage in this tube can be caused by an object or by swelling (edema) in the tube. Problems that can cause a blockage include:  A cold or other upper respiratory  infection.  Allergies.  An irritant, such as tobacco smoke.  Enlarged adenoids. The adenoids are areas of soft tissue located high in the back of the throat, behind the nose and the roof of the mouth.  A mass in the nasopharynx.  Damage to the ear caused by pressure changes (barotrauma). What are the signs or symptoms? Symptoms of this condition include:  Ear pain.  A fever.  Decreased hearing.  A headache.  Tiredness (lethargy).  Fluid leaking from the ear.  Ringing in the ear. How is this diagnosed? This condition is diagnosed with a physical exam. During the exam your health care provider will use an instrument called an otoscope to look into your ear and check for redness, swelling, and fluid. He or she will also ask about your symptoms. Your health care provider may also order tests, such as:  A test to check the movement of the eardrum (pneumatic otoscopy). This test is done by squeezing a small amount of air into the ear.  A test that changes air  pressure in the middle ear to check how well the eardrum moves and whether the eustachian tube is working (tympanogram). How is this treated? This condition usually goes away on its own within 3-5 days. But if the condition is caused by a bacteria infection and does not go away own its own, or keeps coming back, your health care provider may:  Prescribe antibiotic medicines to treat the infection.  Prescribe or recommend medicines to control pain. Follow these instructions at home:  Take over-the-counter and prescription medicines only as told by your health care provider.  If you were prescribed an antibiotic medicine, take it as told by your health care provider. Do not stop taking the antibiotic even if you start to feel better.  Keep all follow-up visits as told by your health care provider. This is important. Contact a health care provider if:  You have bleeding from your nose.  There is a lump on your  neck.  You are not getting better in 5 days.  You feel worse instead of better. Get help right away if:  You have severe pain that is not controlled with medicine.  You have swelling, redness, or pain around your ear.  You have stiffness in your neck.  A part of your face is paralyzed.  The bone behind your ear (mastoid) is tender when you touch it.  You develop a severe headache. Summary  Otitis media is redness, soreness, and swelling of the middle ear.  This condition usually goes away on its own within 3-5 days.  If the problem does not go away in 3-5 days, your health care provider may prescribe or recommend medicines to treat your symptoms.  If you were prescribed an antibiotic medicine, take it as told by your health care provider. This information is not intended to replace advice given to you by your health care provider. Make sure you discuss any questions you have with your health care provider. Document Revised: 10/26/2017 Document Reviewed: 11/03/2016 Elsevier Patient Education  2020 [employer] Eustachian Tube Dysfunction  Eustachian tube dysfunction refers to a condition in which a blockage develops in the narrow passage that connects the middle ear to the back of the nose (eustachian tube). The eustachian tube regulates air pressure in the middle ear by letting air move between the ear and nose. It also helps to drain fluid from the middle ear space. Eustachian tube dysfunction can affect one or both ears. When the eustachian tube does not function properly, air pressure, fluid, or both can build up in the middle ear. What are the causes? This condition occurs when the eustachian tube becomes blocked or cannot open normally. Common causes of this condition include:  Ear infections.  Colds and other infections that affect the nose, mouth, and throat (upper respiratory tract).  Allergies.  Irritation from cigarette smoke.  Irritation from stomach acid  coming up into the esophagus (gastroesophageal reflux). The esophagus is the tube that carries food from the mouth to the stomach.  Sudden changes in air pressure, such as from descending in an airplane or scuba diving.  Abnormal growths in the nose or throat, such as: ? Growths that line the nose (nasal polyps). ? Abnormal growth of cells (tumors). ? Enlarged tissue at the back of the throat (adenoids). What increases the risk? You are more likely to develop this condition if:  You smoke.  You are overweight.  You are a child who has: ? Certain birth defects of the mouth, such  as cleft palate. ? Large tonsils or adenoids. What are the signs or symptoms? Common symptoms of this condition include:  A feeling of fullness in the ear.  Ear pain.  Clicking or popping noises in the ear.  Ringing in the ear.  Hearing loss.  Loss of balance.  Dizziness. Symptoms may get worse when the air pressure around you changes, such as when you travel to an area of high elevation, fly on an airplane, or go scuba diving. How is this diagnosed? This condition may be diagnosed based on:  Your symptoms.  A physical exam of your ears, nose, and throat.  Tests, such as those that measure: ? The movement of your eardrum (tympanogram). ? Your hearing (audiometry). How is this treated? Treatment depends on the cause and severity of your condition.  In mild cases, you may relieve your symptoms by moving air into your ears. This is called "popping the ears."  In more severe cases, or if you have symptoms of fluid in your ears, treatment may include: ? Medicines to relieve congestion (decongestants). ? Medicines that treat allergies (antihistamines). ? Nasal sprays or ear drops that contain medicines that reduce swelling (steroids). ? A procedure to drain the fluid in your eardrum (myringotomy). In this procedure, a small tube is placed in the eardrum to:  Drain the fluid.  Restore the air  in the middle ear space. ? A procedure to insert a balloon device through the nose to inflate the opening of the eustachian tube (balloon dilation). Follow these instructions at home: Lifestyle  Do not do any of the following until your health care provider approves: ? Travel to high altitudes. ? Fly in airplanes. ? Work in a Estate agent or room. ? Scuba dive.  Do not use any products that contain nicotine or tobacco, such as cigarettes and e-cigarettes. If you need help quitting, ask your health care provider.  Keep your ears dry. Wear fitted earplugs during showering and bathing. Dry your ears completely after. General instructions  Take over-the-counter and prescription medicines only as told by your health care provider.  Use techniques to help pop your ears as recommended by your health care provider. These may include: ? Chewing gum. ? Yawning. ? Frequent, forceful swallowing. ? Closing your mouth, holding your nose closed, and gently blowing as if you are trying to blow air out of your nose.  Keep all follow-up visits as told by your health care provider. This is important. Contact a health care provider if:  Your symptoms do not go away after treatment.  Your symptoms come back after treatment.  You are unable to pop your ears.  You have: ? A fever. ? Pain in your ear. ? Pain in your head or neck. ? Fluid draining from your ear.  Your hearing suddenly changes.  You become very dizzy.  You lose your balance. Summary  Eustachian tube dysfunction refers to a condition in which a blockage develops in the eustachian tube.  It can be caused by ear infections, allergies, inhaled irritants, or abnormal growths in the nose or throat.  Symptoms include ear pain, hearing loss, or ringing in the ears.  Mild cases are treated with maneuvers to unblock the ears, such as yawning or ear popping.  Severe cases are treated with medicines. Surgery may also be done  (rare). This information is not intended to replace advice given to you by your health care provider. Make sure you discuss any questions you have with your  health care provider. Document Revised: 03/05/2018 Document Reviewed: 03/05/2018 Elsevier Patient Education  2020 [employer] Sinusitis, Adult Sinusitis is inflammation of your sinuses. Sinuses are hollow spaces in the bones around your face. Your sinuses are located:  Around your eyes.  In the middle of your forehead.  Behind your nose.  In your cheekbones. Mucus normally drains out of your sinuses. When your nasal tissues become inflamed or swollen, mucus can become trapped or blocked. This allows bacteria, viruses, and fungi to grow, which leads to infection. Most infections of the sinuses are caused by a virus. Sinusitis can develop quickly. It can last for up to 4 weeks (acute) or for more than 12 weeks (chronic). Sinusitis often develops after a cold. What are the causes? This condition is caused by anything that creates swelling in the sinuses or stops mucus from draining. This includes:  Allergies.  Asthma.  Infection from bacteria or viruses.  Deformities or blockages in your nose or sinuses.  Abnormal growths in the nose (nasal polyps).  Pollutants, such as chemicals or irritants in the air.  Infection from fungi (rare). What increases the risk? You are more likely to develop this condition if you:  Have a weak body defense system (immune system).  Do a lot of swimming or diving.  Overuse nasal sprays.  Smoke. What are the signs or symptoms? The main symptoms of this condition are pain and a feeling of pressure around the affected sinuses. Other symptoms include:  Stuffy nose or congestion.  Thick drainage from your nose.  Swelling and warmth over the affected sinuses.  Headache.  Upper toothache.  A cough that may get worse at night.  Extra mucus that collects in the throat or the back of the  nose (postnasal drip).  Decreased sense of smell and taste.  Fatigue.  A fever.  Sore throat.  Bad breath. How is this diagnosed? This condition is diagnosed based on:  Your symptoms.  Your medical history.  A physical exam.  Tests to find out if your condition is acute or chronic. This may include: ? Checking your nose for nasal polyps. ? Viewing your sinuses using a device that has a light (endoscope). ? Testing for allergies or bacteria. ? Imaging tests, such as an MRI or CT scan. In rare cases, a bone biopsy may be done to rule out more serious types of fungal sinus disease. How is this treated? Treatment for sinusitis depends on the cause and whether your condition is chronic or acute.  If caused by a virus, your symptoms should go away on their own within 10 days. You may be given medicines to relieve symptoms. They include: ? Medicines that shrink swollen nasal passages (topical intranasal decongestants). ? Medicines that treat allergies (antihistamines). ? A spray that eases inflammation of the nostrils (topical intranasal corticosteroids). ? Rinses that help get rid of thick mucus in your nose (nasal saline washes).  If caused by bacteria, your health care provider may recommend waiting to see if your symptoms improve. Most bacterial infections will get better without antibiotic medicine. You may be given antibiotics if you have: ? A severe infection. ? A weak immune system.  If caused by narrow nasal passages or nasal polyps, you may need to have surgery. Follow these instructions at home: Medicines  Take, use, or apply over-the-counter and prescription medicines only as told by your health care provider. These may include nasal sprays.  If you were prescribed an antibiotic medicine, take it as  told by your health care provider. Do not stop taking the antibiotic even if you start to feel better. Hydrate and humidify   Drink enough fluid to keep your urine  pale yellow. Staying hydrated will help to thin your mucus.  Use a cool mist humidifier to keep the humidity level in your home above 50%.  Inhale steam for 10-15 minutes, 3-4 times a day, or as told by your health care provider. You can do this in the bathroom while a hot shower is running.  Limit your exposure to cool or dry air. Rest  Rest as much as possible.  Sleep with your head raised (elevated).  Make sure you get enough sleep each night. General instructions   Apply a warm, moist washcloth to your face 3-4 times a day or as told by your health care provider. This will help with discomfort.  Wash your hands often with soap and water to reduce your exposure to germs. If soap and water are not available, use hand sanitizer.  Do not smoke. Avoid being around people who are smoking (secondhand smoke).  Keep all follow-up visits as told by your health care provider. This is important. Contact a health care provider if:  You have a fever.  Your symptoms get worse.  Your symptoms do not improve within 10 days. Get help right away if:  You have a severe headache.  You have persistent vomiting.  You have severe pain or swelling around your face or eyes.  You have vision problems.  You develop confusion.  Your neck is stiff.  You have trouble breathing. Summary  Sinusitis is soreness and inflammation of your sinuses. Sinuses are hollow spaces in the bones around your face.  This condition is caused by nasal tissues that become inflamed or swollen. The swelling traps or blocks the flow of mucus. This allows bacteria, viruses, and fungi to grow, which leads to infection.  If you were prescribed an antibiotic medicine, take it as told by your health care provider. Do not stop taking the antibiotic even if you start to feel better.  Keep all follow-up visits as told by your health care provider. This is important. This information is not intended to replace advice  given to you by your health care provider. Make sure you discuss any questions you have with your health care provider. Document Revised: 04/15/2018 Document Reviewed: 04/15/2018 Elsevier Patient Education  2020 [employer]. How to Perform a Sinus Rinse A sinus rinse is a home treatment that is used to rinse your sinuses with a sterile mixture of salt and water (saline solution). Sinuses are air-filled spaces in your skull behind the bones of your face and forehead that open into your nasal cavity. A sinus rinse can help to clear mucus, dirt, dust, or pollen from your nasal cavity. You may do a sinus rinse when you have a cold, a virus, nasal allergy symptoms, a sinus infection, or stuffiness in your nose or sinuses. Talk with your health care provider about whether a sinus rinse might help you. What are the risks? A sinus rinse is generally safe and effective. However, there are a few risks, which include:  A burning sensation in your sinuses. This may happen if you do not make the saline solution as directed. Be sure to follow all directions when making the saline solution.  Nasal irritation.  Infection from contaminated water. This is rare, but possible. Do not do a sinus rinse if you have had ear or  nasal surgery, ear infection, or blocked ears. Supplies needed:  Saline solution or powder.  Distilled or sterile water may be needed to mix with saline powder. ? You may use boiled and cooled tap water. Boil tap water for 5 minutes; cool until it is lukewarm. Use within 24 hours. ? Do not use regular tap water to mix with the saline solution.  Neti pot or nasal rinse bottle. These supplies release the saline solution into your nose and through your sinuses. Neti pots and nasal rinse bottles can be purchased at [employer], a health food store, or online. How to perform a sinus rinse  1. Wash your hands with soap and water. 2. Wash your device according to the directions that  came with the product and then dry it. 3. Use the solution that comes with your product or one that is sold separately in stores. Follow the mixing directions on the package if you need to mix with sterile or distilled water. 4. Fill the device with the amount of saline solution noted in the device instructions. 5. Stand over a sink and tilt your head sideways over the sink. 6. Place the spout of the device in your upper nostril (the one closer to the ceiling). 7. Gently pour or squeeze the saline solution into your nasal cavity. The liquid should drain out from the lower nostril if you are not too congested. 8. While rinsing, breathe through your open mouth. 9. Gently blow your nose to clear any mucus and rinse solution. Blowing too hard may cause ear pain. 10. Repeat in your other nostril. 11. Clean and rinse your device with clean water and then air-dry it. Talk with your health care provider or pharmacist if you have questions about how to do a sinus rinse. Summary  A sinus rinse is a home treatment that is used to rinse your sinuses with a sterile mixture of salt and water (saline solution).  A sinus rinse is generally safe and effective. Follow all instructions carefully.  Before doing a sinus rinse, talk with your health care provider about whether it would be helpful for you. This information is not intended to replace advice given to you by your health care provider. Make sure you discuss any questions you have with your health care provider. Document Revised: 09/10/2017 Document Reviewed: 09/10/2017 Elsevier Patient Education  2020 Else[employer] Inc. Cough, Adult Coughing is a reflex that clears your throat and your airways (respiratory system). Coughing helps to heal and protect your lungs. It is normal to cough occasionally, but a cough that happens with other symptoms or lasts a long time may be a sign of a condition that needs treatment. An acute cough may only last 2-3 weeks, while a  chronic cough may last 8 or more weeks. Coughing is commonly caused by:  Infection of the respiratory systemby viruses or bacteria.  Breathing in substances that irritate your lungs.  Allergies.  Asthma.  Mucus that runs down the back of your throat (postnasal drip).  Smoking.  Acid backing up from the stomach into the esophagus (gastroesophageal reflux).  Certain medicines.  Chronic lung problems.  Other medical conditions such as heart failure or a blood clot in the lung (pulmonary embolism). Follow these instructions at home: Medicines  Take over-the-counter and prescription medicines only as told by your health care provider.  Talk with your health care provider before you take a cough suppressant medicine. Lifestyle   Avoid cigarette smoke. Do not use any products  that contain nicotine or tobacco, such as cigarettes, e-cigarettes, and chewing tobacco. If you need help quitting, ask your health care provider.  Drink enough fluid to keep your urine pale yellow.  Avoid caffeine.  Do not drink alcohol if your health care provider tells you not to drink. General instructions   Pay close attention to changes in your cough. Tell your health care provider about them.  Always cover your mouth when you cough.  Avoid things that make you cough, such as perfume, candles, cleaning products, or campfire or tobacco smoke.  If the air is dry, use a cool mist vaporizer or humidifier in your bedroom or your home to help loosen secretions.  If your cough is worse at night, try to sleep in a semi-upright position.  Rest as needed.  Keep all follow-up visits as told by your health care provider. This is important. Contact a health care provider if you:  Have new symptoms.  Cough up pus.  Have a cough that does not get better after 2-3 weeks or gets worse.  Cannot control your cough with cough suppressant medicines and you are losing sleep.  Have pain that gets worse or  pain that is not helped with medicine.  Have a fever.  Have unexplained weight loss.  Have night sweats. Get help right away if:  You cough up blood.  You have difficulty breathing.  Your heartbeat is very fast. These symptoms may represent a serious problem that is an emergency. Do not wait to see if the symptoms will go away. Get medical help right away. Call your local emergency services (911 in the [place] Do not drive yourself to the hospital. Summary  Coughing is a reflex that clears your throat and your airways. It is normal to cough occasionally, but a cough that happens with other symptoms or lasts a long time may be a sign of a condition that needs treatment.  Take over-the-counter and prescription medicines only as told by your health care provider.  Always cover your mouth when you cough.  Contact a health care provider if you have new symptoms or a cough that does not get better after 2-3 weeks or gets worse. This information is not intended to replace advice given to you by your health care provider. Make sure you discuss any questions you have with your health care provider. Document Revised: 12/02/2018 Document Reviewed: 12/02/2018 Elsevier Patient Education  2020 [employer]

## 2020-10-26 NOTE — Progress Notes (Signed)
Subjective:    Patient ID: Stephanie Chandler, female    DOB: [birth year], 54 y.o.   MRN: [id number]  54y/o established african [place] female pt c/o allergy flare after having work done in home and increased dust exposure since 10/20/20. C/o sore throat, chest congestion with productive cough, ear drainage bilaterally, muffled hearing, rhinorrhea, fatigue, chills. Denies sinus pain/pressure, HA, body aches, fever, n/v/d. Now that she is starting to cough, she is wondering if abx are indicated.  Had both covid vaccines after her covid infection didn't have card today to transcribe will bring to clinic at another time.  Hasn't received flu vaccine will come back when feeling better.  Has continued not smoking.  Still sleeping well with her CPAP for OSA.  Typically needs diflucan if given antibiotics.  Needs to get well to take care of her dad who requires assistance for ADLs.  Had negative covid test yesterday at [facility].  Two coworkers in same dept had similar symptoms prior to patient and all tested negative for covid.      Review of Systems  Constitutional: Positive for chills and fatigue. Negative for activity change, appetite change, diaphoresis and fever.  HENT: Positive for congestion, ear discharge, ear pain, hearing loss, postnasal drip, rhinorrhea, sore throat and voice change. Negative for dental problem, drooling, facial swelling, sinus pressure, sinus pain, sneezing, tinnitus and trouble swallowing.   Eyes: Negative for photophobia and visual disturbance.  Respiratory: Positive for cough. Negative for choking, chest tightness, shortness of breath, wheezing and stridor.   Cardiovascular: Negative for chest pain.  Gastrointestinal: Negative for diarrhea, nausea and vomiting.  Endocrine: Negative for cold intolerance and heat intolerance.  Genitourinary: Negative for difficulty urinating.  Musculoskeletal: Negative for back pain, gait problem, neck pain and neck stiffness.  Skin: Negative  for rash.  Allergic/Immunologic: Positive for environmental allergies. Negative for food allergies.  Neurological: Negative for dizziness, tremors, seizures, syncope, facial asymmetry, speech difficulty, weakness, light-headedness, numbness and headaches.  Hematological: Negative for adenopathy. Does not bruise/bleed easily.  Psychiatric/Behavioral: Negative for agitation, confusion and sleep disturbance.       Objective:   Physical Exam Vitals and nursing note reviewed.  Constitutional:      General: She is not in acute distress.    Appearance: Normal appearance. She is well-developed and well-groomed. She is obese. She is ill-appearing. She is not toxic-appearing or diaphoretic.  HENT:     Head: Normocephalic and atraumatic.     Jaw: There is normal jaw occlusion. No trismus, tenderness, swelling, pain on movement or malocclusion.     Salivary Glands: Right salivary gland is not diffusely enlarged or tender. Left salivary gland is not diffusely enlarged or tender.     Right Ear: Hearing, ear canal and external ear normal. A middle ear effusion is present. There is no impacted cerumen.     Left Ear: Hearing, ear canal and external ear normal. A middle ear effusion is present. There is no impacted cerumen.     Nose: Mucosal edema, congestion and rhinorrhea present. No nasal deformity, septal deviation, signs of injury, laceration or nasal tenderness. Rhinorrhea is clear.     Right Turbinates: Enlarged and swollen. Not pale.     Left Turbinates: Enlarged and swollen. Not pale.     Right Sinus: No maxillary sinus tenderness or frontal sinus tenderness.     Left Sinus: No maxillary sinus tenderness or frontal sinus tenderness.     Mouth/Throat:     Lips: Pink. No lesions.  Mouth: Mucous membranes are moist. Mucous membranes are not pale, not dry and not cyanotic. No injury, lacerations, oral lesions or angioedema.     Dentition: No gingival swelling, dental caries, dental abscesses or gum  lesions.     Tongue: No lesions. Tongue does not deviate from midline.     Palate: No mass and lesions.     Pharynx: Uvula midline. Pharyngeal swelling and posterior oropharyngeal erythema present. No oropharyngeal exudate or uvula swelling.     Tonsils: No tonsillar exudate or tonsillar abscesses. 0 on the right. 0 on the left.     Comments: Nasal congestion and voice deeper than usual Eyes:     General: Lids are normal. Vision grossly intact. Gaze aligned appropriately. Allergic shiner present. No visual field deficit or scleral icterus.       Right eye: No foreign body, discharge or hordeolum.        Left eye: No foreign body, discharge or hordeolum.     Extraocular Movements: Extraocular movements intact.     Right eye: Normal extraocular motion and no nystagmus.     Left eye: Normal extraocular motion and no nystagmus.     Conjunctiva/sclera: Conjunctivae normal.     Right eye: Right conjunctiva is not injected. No chemosis, exudate or hemorrhage.    Left eye: Left conjunctiva is not injected. No chemosis, exudate or hemorrhage.    Pupils: Pupils are equal, round, and reactive to light. Pupils are equal.     Right eye: Pupil is round and reactive.     Left eye: Pupil is round and reactive.  Neck:     Thyroid: No thyroid mass or thyromegaly.     Trachea: Trachea and phonation normal. No tracheal tenderness or tracheal deviation.  Cardiovascular:     Rate and Rhythm: Normal rate and regular rhythm.     Chest Wall: PMI is not displaced.     Pulses: Normal pulses.     Heart sounds: Normal heart sounds, S1 normal and S2 normal. No murmur heard.  No friction rub. No gallop.   Pulmonary:     Effort: Pulmonary effort is normal. No accessory muscle usage or respiratory distress.     Breath sounds: Normal breath sounds and air entry. No stridor, decreased air movement or transmitted upper airway sounds. No decreased breath sounds, wheezing, rhonchi or rales.     Comments: Spoke full  sentences without difficulty; disposable surgical mask worn due to covid 19 pandemic; no cough observed in clinic Chest:     Chest wall: No tenderness.  Abdominal:     General: Abdomen is flat. There is no distension.     Palpations: Abdomen is soft.  Musculoskeletal:        General: No swelling, tenderness, deformity or signs of injury. Normal range of motion.     Right shoulder: Normal.     Left shoulder: Normal.     Right elbow: Normal.     Left elbow: Normal.     Right hand: Normal.     Left hand: Normal.     Cervical back: Normal, normal range of motion and neck supple. No swelling, edema, deformity, erythema, signs of trauma, lacerations, rigidity, torticollis or crepitus. No pain with movement or muscular tenderness. Normal range of motion.     Thoracic back: Normal.     Lumbar back: Normal.     Right hip: Normal.     Left hip: Normal.     Right knee: Normal.  Left knee: Normal.     Right lower leg: No edema.     Left lower leg: No edema.  Lymphadenopathy:     Head:     Right side of head: No submental, submandibular, tonsillar, preauricular, posterior auricular or occipital adenopathy.     Left side of head: No submental, submandibular, tonsillar, preauricular, posterior auricular or occipital adenopathy.     Cervical: No cervical adenopathy.     Right cervical: No superficial, deep or posterior cervical adenopathy.    Left cervical: No superficial, deep or posterior cervical adenopathy.  Skin:    General: Skin is warm and dry.     Capillary Refill: Capillary refill takes less than 2 seconds.     Coloration: Skin is not ashen, cyanotic, jaundiced, mottled, pale or sallow.     Findings: No abrasion, abscess, acne, bruising, burn, ecchymosis, erythema, signs of injury, laceration, lesion, petechiae, rash or wound.     Nails: There is no clubbing.  Neurological:     General: No focal deficit present.     Mental Status: She is alert and oriented to person, place, and  time. Mental status is at baseline. She is not disoriented.     GCS: GCS eye subscore is 4. GCS verbal subscore is 5. GCS motor subscore is 6.     Cranial Nerves: Cranial nerves are intact. No cranial nerve deficit, dysarthria or facial asymmetry.     Sensory: Sensation is intact. No sensory deficit.     Motor: Motor function is intact. No weakness, tremor, atrophy, abnormal muscle tone or seizure activity.     Coordination: Coordination is intact. Coordination normal.     Gait: Gait is intact. Gait normal.     Comments: In/out of chair and on/off exam table without difficulty; gait sure and steady in clinic; bilateral hand grasp equal 5/5  Psychiatric:        Attention and Perception: Attention and perception normal.        Mood and Affect: Mood and affect normal.        Speech: Speech normal.        Behavior: Behavior normal. Behavior is cooperative.        Thought Content: Thought content normal.        Cognition and Memory: Cognition and memory normal.        Judgment: Judgment normal.      Left TM pink centrally; auditory canal red right 6 oclock using q tips discussed stop use as canal irritated/dry no debris/moisture; sinus pressure with leaning forward behind eyes like putting on shoes during PE; Congested no cough in clinic spoke full sentences without difficulty; lungs clear all fields bilaterally; air fluid level clear ears; bilateral allergic shiners, nasal turbinates edema erythema clear discharge     Assessment & Plan:  A-acute rhinosinusitis, eustachian tube dysfunction bilaterally, elevated blood pressure, BMI 40, need for flu vaccine  P-[employer] with similar symptoms negative tests discussed 2-3 weeks viral URI typical course seen in community currently. continue meds another couple weeks after improvement otherwise seen patients sliding back bronchitis/pneumonia/sinusitis/ear infections  Gave new bottle saline today from clinic stock  Prednisone can increase/decrease  appetite, raise BP/HR, cause insomnia therefore take with breakfast.  Trial prednisone 48 hours and if worsening start amoxicillin last use 10/17/2019 Amoxicillin 875mg  po BID x 10 days #20 RF0 dispensed from [facility] to patient start if after 48 hours prednisone no improvement in symptoms. Diflucan 150mg  po x 1 if vaginal discharge/symptoms after oral antibiotics #  1 RF0 to her pharmacy of choice. Prednisone 10mg  taper with breakfast po daily (60mg  day 1, 50 day 2, 40mg , 30mg , 20mg , 10mg ) #21 RF0 dispensed from [facility] to patient, saline 2 sprays each nostril q2h wa prn congestion/dryness.  Consistent use of flonase every day 1 spray each nostril BID.  May continue phenylephrine 5-10mg  po q6h prn rhinitis/congestion OTC.  Denied personal or family history of ENT cancer.  Shower BID especially prior to bed. No evidence of systemic bacterial infection, non toxic and well hydrated.  I do not see where any further testing or imaging is necessary at this time.   I will suggest supportive care, rest, good hygiene and encourage the patient to take adequate fluids.  The patient is to return to clinic or EMERGENCY ROOM if symptoms worsen or change significantly.  Exitcare handout on sinusitis and sinus rinse.  Patient verbalized agreement and understanding of treatment plan and had no further questions at this time.   P2:  Hand washing and cover cough  Follow up with RN for Blood pressure follow up when off OTC cough/cold medications and prednisone.  Encouraged patient to continue her weight loss.  Noted Weight loss 2 lbs today per patient.  Patient verbalized understanding information/instructions, agreed with plan of care and had no further questions at this time.   Positive covid Dec 2020.  Had vaccines pfizer x 2 location for covid unsure of dates.  Denied receiving her flu vaccine this year encouraged her to get it when feeling better.  For covid vaccine discussed mixing may be beneficial moderna  with pfizer against delta variant.  New variant in [place] consider will see what new variant is doing with each of the vaccines used in the coming month.  Bring in her card for vaccine transcription schedule with RN flu vaccine and BP follow up.  Flu vaccine showed protection to covid also last winter  Patient verbalized understanding information/no further questions at this time.

## 2020-11-04 ENCOUNTER — Ambulatory Visit: Payer: Self-pay | Admitting: Registered Nurse

## 2020-11-04 ENCOUNTER — Other Ambulatory Visit: Payer: Self-pay

## 2020-11-04 ENCOUNTER — Encounter: Payer: Self-pay | Admitting: Registered Nurse

## 2020-11-04 VITALS — BP 124/88 | HR 93 | Temp 98.5°F | Resp 16 | Wt 213.0 lb

## 2020-11-04 DIAGNOSIS — G8929 Other chronic pain: Secondary | ICD-10-CM | POA: Insufficient documentation

## 2020-11-04 DIAGNOSIS — J4520 Mild intermittent asthma, uncomplicated: Secondary | ICD-10-CM | POA: Insufficient documentation

## 2020-11-04 DIAGNOSIS — E1165 Type 2 diabetes mellitus with hyperglycemia: Secondary | ICD-10-CM | POA: Insufficient documentation

## 2020-11-04 DIAGNOSIS — J4521 Mild intermittent asthma with (acute) exacerbation: Secondary | ICD-10-CM

## 2020-11-04 DIAGNOSIS — J45901 Unspecified asthma with (acute) exacerbation: Secondary | ICD-10-CM | POA: Insufficient documentation

## 2020-11-04 MED ORDER — ALBUTEROL SULFATE HFA 108 (90 BASE) MCG/ACT IN AERS: 1.0000 | INHALATION_SPRAY | RESPIRATORY_TRACT | 0 refills | Status: DC | PRN

## 2020-11-04 MED ORDER — SALINE SPRAY 0.65 % NA SOLN
2.0000 | Freq: Every day | NASAL | Status: DC | PRN
Start: 2020-11-04 — End: 2022-01-20

## 2020-11-04 MED ORDER — PREDNISONE 10 MG PO TABS: 20.0000 mg | ORAL_TABLET | Freq: Every day | ORAL | 0 refills | Status: AC

## 2020-11-04 NOTE — Patient Instructions (Signed)
Acute Bronchitis, Adult  Acute bronchitis is sudden or acute swelling of the air tubes (bronchi) in the lungs. Acute bronchitis causes these tubes to fill with mucus, which can make it hard to breathe. It can also cause coughing or wheezing. In adults, acute bronchitis usually goes away within 2 weeks. A cough caused by bronchitis may last up to 3 weeks. Smoking, allergies, and asthma can make the condition worse. What are the causes? This condition can be caused by germs and by substances that irritate the lungs, including:  Cold and flu viruses. The most common cause of this condition is the virus that causes the common cold.  Bacteria.  Substances that irritate the lungs, including: ? Smoke from cigarettes and other forms of tobacco. ? Dust and pollen. ? Fumes from chemical products, gases, or burned fuel. ? Other materials that pollute indoor or outdoor air.  Close contact with someone who has acute bronchitis. What increases the risk? The following factors may make you more likely to develop this condition:  A weak body's defense system, also called the immune system.  A condition that affects your lungs and breathing, such as asthma. What are the signs or symptoms? Common symptoms of this condition include:  Lung and breathing problems, such as: ? Coughing. This may bring up clear, yellow, or green mucus from your lungs (sputum). ? Wheezing. ? Having too much mucus in your lungs (chest congestion). ? Having shortness of breath.  A fever.  Chills.  Aches and pains, including: ? Tightness in your chest and other body aches. ? A sore throat. How is this diagnosed? This condition is usually diagnosed based on:  Your symptoms and medical history.  A physical exam. You may also have other tests, including tests to rule out other conditions, such pneumonia. These tests include:  A test of lung function.  Test of a mucus sample to look for the presence of  bacteria.  Tests to check the oxygen level in your blood.  Blood tests.  Chest X-ray. How is this treated? Most cases of acute bronchitis clear up over time without treatment. Your health care provider may recommend:  Drinking more fluids. This can thin your mucus, which may improve your breathing.  Taking a medicine for a fever or cough.  Using a device that gets medicine into your lungs (inhaler) to help improve breathing and control coughing.  Using a vaporizer or a humidifier. These are machines that add water to the air to help you breathe better. Follow these instructions at home: Activity  Get plenty of rest.  Return to your normal activities as told by your health care provider. Ask your health care provider what activities are safe for you. Lifestyle  Drink enough fluid to keep your urine pale yellow.  Do not drink alcohol.  Do not use any products that contain nicotine or tobacco, such as cigarettes, e-cigarettes, and chewing tobacco. If you need help quitting, ask your health care provider. Be aware that: ? Your bronchitis will get worse if you smoke or breathe in other people's smoke (secondhand smoke). ? Your lungs will heal faster if you quit smoking. General instructions   Take over-the-counter and prescription medicines only as told by your health care provider.  Use an inhaler, vaporizer, or humidifier as told by your health care provider.  If you have a sore throat, gargle with a salt-water mixture 3-4 times a day or as needed. To make a salt-water mixture, completely dissolve -1 tsp (3-6   g) of salt in 1 cup (237 mL) of warm water.  Keep all follow-up visits as told by your health care provider. This is important. How is this prevented? To lower your risk of getting this condition again:  Wash your hands often with soap and water. If soap and water are not available, use hand sanitizer.  Avoid contact with people who have cold symptoms.  Try not to  touch your mouth, nose, or eyes with your hands.  Avoid places where there are fumes from chemicals. Breathing these fumes will make your condition worse.  Get the flu shot every year. Contact a health care provider if:  Your symptoms do not improve after 2 weeks of treatment.  You vomit more than once or twice.  You have symptoms of dehydration such as: ? Dark urine. ? Dry skin or eyes. ? Increased thirst. ? Headaches. ? Confusion. ? Muscle cramps. Get help right away if you:  Cough up blood.  Feel pain in your chest.  Have severe shortness of breath.  Faint or keep feeling like you are going to faint.  Have a severe headache.  Have fever or chills that get worse. These symptoms may represent a serious problem that is an emergency. Do not wait to see if the symptoms will go away. Get medical help right away. Call your local emergency services (911 in the [place] Do not drive yourself to the hospital. Summary  Acute bronchitis is sudden (acute) inflammation of the air tubes (bronchi) between the windpipe and the lungs. In adults, acute bronchitis usually goes away within 2 weeks, although coughing may last 3 weeks or longer  Take over-the-counter and prescription medicines only as told by your health care provider.  Drink enough fluid to keep your urine pale yellow.  Contact a health care provider if your symptoms do not improve after 2 weeks of treatment.  Get help right away if you cough up blood, faint, or have chest pain or shortness of breath. This information is not intended to replace advice given to you by your health care provider. Make sure you discuss any questions you have with your health care provider. Document Revised: 07/28/2019 Document Reviewed: 06/06/2019 Elsevier Patient Education  2020 [employer]. How to Use a Metered Dose Inhaler A metered dose inhaler is a handheld device for taking medicine that must be breathed into the lungs (inhaled). The  device can be used to deliver a variety of inhaled medicines, including:  Quick relief or rescue medicines, such as bronchodilators.  Controller medicines, such as corticosteroids. The medicine is delivered by pushing down on a metal canister to release a preset amount of spray and medicine. Each device contains the amount of medicine that is needed for a preset number of uses (inhalations). Your health care provider may recommend that you use a spacer with your inhaler to help you take the medicine more effectively. A spacer is a plastic tube with a mouthpiece on one end and an opening that connects to the inhaler on the other end. A spacer holds the medicine in a tube for a short time, which allows you to inhale more medicine. What are the risks? If you do not use your inhaler correctly, medicine might not reach your lungs to help you breathe. Inhaler medicine can cause side effects, such as:  Mouth or throat infection.  Cough.  Hoarseness.  Headache.  Nausea and vomiting.  Lung infection (pneumonia) in people who have a lung condition called COPD. How  to use a metered dose inhaler without a spacer  1. Remove the cap from the inhaler. 2. If you are using the inhaler for the first time, shake it for 5 seconds, turn it away from your face, then release 4 puffs into the air. This is called priming. 3. Shake the inhaler for 5 seconds. 4. Position the inhaler so the top of the canister faces up. 5. Put your index finger on the top of the medicine canister. Support the bottom of the inhaler with your thumb. 6. Breathe out normally and as completely as possible, away from the inhaler. 7. Either place the inhaler between your teeth and close your lips tightly around the mouthpiece, or hold the inhaler 1-2 inches (2.5-5 cm) away from your open mouth. Keep your tongue down out of the way. If you are unsure which technique to use, ask your health care provider. 8. Press the canister down with  your index finger to release the medicine, then inhale deeply and slowly through your mouth (not your nose) until your lungs are completely filled. Inhaling should take 4-6 seconds. 9. Hold the medicine in your lungs for 5-10 seconds (10 seconds is best). This helps the medicine get into the small airways of your lungs. 10. With your lips in a tight circle (pursed), breathe out slowly. 11. Repeat steps 3-10 until you have taken the number of puffs that your health care provider directed. Wait about 1 minute between puffs or as directed. 12. Put the cap on the inhaler. 13. If you are using a steroid inhaler, rinse your mouth with water, gargle, and spit out the water. Do not swallow the water. How to use a metered dose inhaler with a spacer  1. Remove the cap from the inhaler. 2. If you are using the inhaler for the first time, shake it for 5 seconds, turn it away from your face, then release 4 puffs into the air. This is called priming. 3. Shake the inhaler for 5 seconds. 4. Place the open end of the spacer onto the inhaler mouthpiece. 5. Position the inhaler so the top of the canister faces up and the spacer mouthpiece faces you. 6. Put your index finger on the top of the medicine canister. Support the bottom of the inhaler and the spacer with your thumb. 7. Breathe out normally and as completely as possible, away from the spacer. 8. Place the spacer between your teeth and close your lips tightly around it. Keep your tongue down out of the way. 9. Press the canister down with your index finger to release the medicine, then inhale deeply and slowly through your mouth (not your nose) until your lungs are completely filled. Inhaling should take 4-6 seconds. 10. Hold the medicine in your lungs for 5-10 seconds (10 seconds is best). This helps the medicine get into the small airways of your lungs. 11. With your lips in a tight circle (pursed), breathe out slowly. 12. Repeat steps 3-11 until you have  taken the number of puffs that your health care provider directed. Wait about 1 minute between puffs or as directed. 13. Remove the spacer from the inhaler and put the cap on the inhaler. 14. If you are using a steroid inhaler, rinse your mouth with water, gargle, and spit out the water. Do not swallow the water. Follow these instructions at home:  Take your inhaled medicine only as told by your health care provider. Do not use the inhaler more than directed by your health  care provider.  Keep all follow-up visits as told by your health care provider. This is important.  If your inhaler has a counter, you can check it to determine how full your inhaler is. If your inhaler does not have a counter, ask your health care provider when you will need to refill your inhaler and write the refill date on a calendar or on your inhaler canister. Note that you cannot know when an inhaler is empty by shaking it.  Follow directions on the package insert for care and cleaning of your inhaler and spacer. Contact a health care provider if:  Symptoms are only partially relieved with your inhaler.  You are having trouble using your inhaler.  You have an increase in phlegm.  You have headaches. Get help right away if:  You feel little or no relief after using your inhaler.  You have dizziness.  You have a fast heart rate.  You have chills or a fever.  You have night sweats.  There is blood in your phlegm. Summary  A metered dose inhaler is a handheld device for taking medicine that must be breathed into the lungs (inhaled).  The medicine is delivered by pushing down on a metal canister to release a preset amount of spray and medicine.  Each device contains the amount of medicine that is needed for a preset number of uses (inhalations). This information is not intended to replace advice given to you by your health care provider. Make sure you discuss any questions you have with your health care  provider. Document Revised: 10/26/2017 Document Reviewed: 10/03/2016 Elsevier Patient Education  2020 [employer] Asthma Attack Prevention, Adult Although you may not be able to control the fact that you have asthma, you can take actions to prevent episodes of asthma (asthma attacks). These actions include:  Creating a written plan for managing and treating your asthma attacks (asthma action plan).  Monitoring your asthma.  Avoiding things that can irritate your airways or make your asthma symptoms worse (asthma triggers).  Taking your medicines as directed.  Acting quickly if you have signs or symptoms of an asthma attack. What are some ways to prevent an asthma attack? Create a plan Work with your health care provider to create an asthma action plan. This plan should include:  A list of your asthma triggers and how to avoid them.  A list of symptoms that you experience during an asthma attack.  Information about when to take medicine and how much medicine to take.  Information to help you understand your peak flow measurements.  Contact information for your health care providers.  Daily actions that you can take to control asthma. Monitor your asthma To monitor your asthma:  Use your peak flow meter every morning and every evening for 2-3 weeks. Record the results in a journal. A drop in your peak flow numbers on one or more days may mean that you are starting to have an asthma attack, even if you are not having symptoms.  When you have asthma symptoms, write them down in a journal.  Avoid asthma triggers Work with your health care provider to find out what your asthma triggers are. This can be done by:  Being tested for allergies.  Keeping a journal that notes when asthma attacks occur and what may have contributed to them.  Asking your health care provider whether other medical conditions make your asthma worse. Common asthma triggers include:  Dust.  Smoke.  This includes campfire  smoke and secondhand smoke from tobacco products.  Pet dander.  Trees, grasses or pollens.  Very cold, dry, or humid air.  Mold.  Foods that contain high amounts of sulfites.  Strong smells.  Engine exhaust and air pollution.  Aerosol sprays and fumes from household cleaners.  Household pests and their droppings, including dust mites and cockroaches.  Certain medicines, including NSAIDs. Once you have determined your asthma triggers, take steps to avoid them. Depending on your triggers, you may be able to reduce the chance of an asthma attack by:  Keeping your home clean. Have someone dust and vacuum your home for you 1 or 2 times a week. If possible, have them use a high-efficiency particulate arrestance (HEPA) vacuum.  Washing your sheets weekly in hot water.  Using allergy-proof mattress covers and casings on your bed.  Keeping pets out of your home.  Taking care of mold and water problems in your home.  Avoiding areas where people smoke.  Avoiding using strong perfumes or odor sprays.  Avoid spending a lot of time outdoors when pollen counts are high and on very windy days.  Talking with your health care provider before stopping or starting any new medicines. Medicines Take over-the-counter and prescription medicines only as told by your health care provider. Many asthma attacks can be prevented by carefully following your medicine schedule. Taking your medicines correctly is especially important when you cannot avoid certain asthma triggers. Even if you are doing well, do not stop taking your medicine and do not take less medicine. Act quickly If an asthma attack happens, acting quickly can decrease how severe it is and how long it lasts. Take these actions:  Pay attention to your symptoms. If you are coughing, wheezing, or having difficulty breathing, do not wait to see if your symptoms go away on their own. Follow your asthma action  plan.  If you have followed your asthma action plan and your symptoms are not improving, call your health care provider or seek immediate medical care at the nearest hospital. It is important to write down how often you need to use your fast-acting rescue inhaler. You can track how often you use an inhaler in your journal. If you are using your rescue inhaler more often, it may mean that your asthma is not under control. Adjusting your asthma treatment plan may help you to prevent future asthma attacks and help you to gain better control of your condition. How can I prevent an asthma attack when I exercise? Exercise is a common asthma trigger. To prevent asthma attacks during exercise:  Follow advice from your health care provider about whether you should use your fast-acting inhaler before exercising. Many people with asthma experience exercise-induced bronchoconstriction (EIB). This condition often worsens during vigorous exercise in cold, humid, or dry environments. Usually, people with EIB can stay very active by using a fast-acting inhaler before exercising.  Avoid exercising outdoors in very cold or humid weather.  Avoid exercising outdoors when pollen counts are high.  Warm up and cool down when exercising.  Stop exercising right away if asthma symptoms start. Consider taking part in exercises that are less likely to cause asthma symptoms such as:  Indoor swimming.  Biking.  Walking.  Hiking.  Playing football. This information is not intended to replace advice given to you by your health care provider. Make sure you discuss any questions you have with your health care provider. Document Revised: 10/26/2017 Document Reviewed: 04/29/2016 Elsevier Patient Education  [phone]  [employer].

## 2020-11-04 NOTE — Progress Notes (Signed)
Subjective:    Patient ID: Stephanie Chandler, female    DOB: [birth year], 54 y.o.   MRN: [id number]  54y/o African American established female pt seen 10/26/20 for viral URI. Has finished Prednisone course and has one day left on amoxicillin course. Continues to have cough, non-productive now along with tightness in her chest that is associated with ShOB and cough. Still having L ear drainage as well. Using albuterol neb at home, and continuing phenylephrine, nasal saline spray, and Flonase. Requests Albuterol inhaler to have at work. Voice going in and out, speaking often starts coughing spells.  Denied fever/chills/hemoptysis.  Some sinus pressure.  Denied restarting smoking and continuing tobacco cessation.     Review of Systems  Constitutional: Positive for fatigue. Negative for activity change, appetite change, chills, diaphoresis and fever.  HENT: Positive for congestion, ear discharge, postnasal drip, rhinorrhea, sinus pressure and sinus pain. Negative for ear pain, facial swelling, hearing loss, mouth sores, nosebleeds, sneezing, sore throat, tinnitus and trouble swallowing.   Eyes: Negative for photophobia and visual disturbance.  Respiratory: Positive for cough, chest tightness and shortness of breath. Negative for choking and stridor.   Cardiovascular: Negative for palpitations and leg swelling.  Gastrointestinal: Negative for diarrhea, nausea and vomiting.  Endocrine: Negative for cold intolerance and heat intolerance.  Genitourinary: Negative for difficulty urinating.  Musculoskeletal: Negative for gait problem, neck pain and neck stiffness.  Skin: Negative for rash.  Allergic/Immunologic: Positive for environmental allergies. Negative for food allergies.  Neurological: Negative for dizziness, tremors, seizures, syncope, facial asymmetry, speech difficulty, weakness, light-headedness, numbness and headaches.  Hematological: Negative for adenopathy. Does not bruise/bleed easily.   Psychiatric/Behavioral: Negative for agitation, confusion and sleep disturbance.       Objective:   Physical Exam Vitals and nursing note reviewed.  Constitutional:      General: She is awake and active. She is not in acute distress.    Appearance: Normal appearance. She is well-developed, well-groomed and well-nourished. She is obese. She is ill-appearing. She is not toxic-appearing, sickly-appearing or diaphoretic.  HENT:     Head: Normocephalic and atraumatic.     Jaw: There is normal jaw occlusion. No trismus.     Salivary Glands: Right salivary gland is not diffusely enlarged or tender. Left salivary gland is not diffusely enlarged or tender.     Right Ear: Hearing, ear canal and external ear normal. A middle ear effusion is present.     Left Ear: Hearing, ear canal and external ear normal. A middle ear effusion is present.     Ears:     Comments: Bilateral TMs air fluid level clear; right auditory canal 6 oclock slight erythema no debris/cerumen canal dry    Nose: Mucosal edema, congestion and rhinorrhea present. No nasal deformity, septal deviation, signs of injury, laceration, nasal tenderness, sinus tenderness, nasal septal hematoma, epistaxis or foreign body. Rhinorrhea is clear.     Right Turbinates: Enlarged and swollen.     Left Turbinates: Enlarged and swollen.     Right Sinus: Maxillary sinus tenderness present. No frontal sinus tenderness.     Left Sinus: Maxillary sinus tenderness present. No frontal sinus tenderness.     Mouth/Throat:     Lips: Pink. No lesions.     Mouth: Mucous membranes are normal. Mucous membranes are moist. Mucous membranes are not pale, not dry and not cyanotic. No injury, lacerations, oral lesions or angioedema.     Dentition: No gingival swelling, dental abscesses or gum lesions.  Tongue: No lesions. Tongue does not deviate from midline.     Palate: No mass and lesions.     Pharynx: Uvula midline. Pharyngeal swelling, posterior  oropharyngeal edema and posterior oropharyngeal erythema present. No oropharyngeal exudate or uvula swelling.     Tonsils: No tonsillar exudate or tonsillar abscesses.     Comments: Cobblestoning posterior pharynx; bilateral allergic shiners; frequent nasal sniffing/congestion Eyes:     General: Lids are normal. Vision grossly intact. Gaze aligned appropriately. Allergic shiner present. No visual field deficit or scleral icterus.       Right eye: No foreign body, discharge or hordeolum.        Left eye: No foreign body, discharge or hordeolum.     Extraocular Movements: Extraocular movements intact and EOM normal.     Right eye: Normal extraocular motion and no nystagmus.     Left eye: Normal extraocular motion and no nystagmus.     Conjunctiva/sclera: Conjunctivae normal.     Right eye: Right conjunctiva is not injected. No chemosis, exudate or hemorrhage.    Left eye: Left conjunctiva is not injected. No chemosis, exudate or hemorrhage.    Pupils: Pupils are equal, round, and reactive to light. Pupils are equal.     Right eye: Pupil is round and reactive.     Left eye: Pupil is round and reactive.  Neck:     Thyroid: No thyroid mass or thyromegaly.     Trachea: Trachea and phonation normal. No tracheal tenderness or tracheal deviation.  Cardiovascular:     Rate and Rhythm: Normal rate and regular rhythm.     Chest Wall: PMI is not displaced.     Pulses: Normal pulses and intact distal pulses.          Radial pulses are 2+ on the right side and 2+ on the left side.     Heart sounds: Normal heart sounds, S1 normal and S2 normal. No murmur heard. No friction rub. No gallop.   Pulmonary:     Effort: Pulmonary effort is normal. No accessory muscle usage or respiratory distress.     Breath sounds: Normal breath sounds and air entry. No stridor or transmitted upper airway sounds. No decreased breath sounds, wheezing, rhonchi or rales.     Comments: Wearing mask due to covid 19 pandemic; spoke  full sentences without difficulty; nonproductive intermittent nonharsh cough noted in exam room Chest:     Chest wall: No tenderness.  Abdominal:     General: There is no distension.     Palpations: Abdomen is soft.  Musculoskeletal:        General: No swelling, tenderness, deformity, signs of injury or edema. Normal range of motion.     Right shoulder: Normal.     Left shoulder: Normal.     Right elbow: Normal.     Left elbow: Normal.     Right hand: Normal.     Left hand: Normal.     Cervical back: Normal, normal range of motion and neck supple. No swelling, edema, deformity, erythema, signs of trauma, lacerations, rigidity, torticollis, tenderness or crepitus. No pain with movement or muscular tenderness. Normal range of motion. Normal.     Thoracic back: No swelling, edema, deformity, signs of trauma or lacerations.     Lumbar back: No swelling, edema, deformity, signs of trauma or lacerations.     Right hip: Normal.     Left hip: Normal.     Right knee: Normal.     Left  knee: Normal.  Lymphadenopathy:     Head:     Right side of head: No submental, submandibular, tonsillar, preauricular, posterior auricular or occipital adenopathy.     Left side of head: No submental, submandibular, tonsillar, preauricular, posterior auricular or occipital adenopathy.     Cervical: No cervical adenopathy.     Right cervical: No superficial, deep or posterior cervical adenopathy.    Left cervical: No superficial, deep or posterior cervical adenopathy.  Skin:    General: Skin is warm, dry and intact.     Capillary Refill: Capillary refill takes less than 2 seconds.     Coloration: Skin is not ashen, cyanotic, jaundiced, mottled, pale or sallow.     Findings: No abrasion, abscess, acne, bruising, burn, ecchymosis, erythema, signs of injury, laceration, lesion, petechiae, rash or wound.     Nails: There is no clubbing or cyanosis.  Neurological:     General: No focal deficit present.     Mental  Status: She is alert and oriented to person, place, and time. Mental status is at baseline. She is not disoriented.     GCS: GCS eye subscore is 4. GCS verbal subscore is 5. GCS motor subscore is 6.     Cranial Nerves: Cranial nerves are intact. No cranial nerve deficit, dysarthria or facial asymmetry.     Sensory: Sensation is intact. No sensory deficit.     Motor: Motor function is intact. No weakness, tremor, atrophy, abnormal muscle tone or seizure activity.     Coordination: Coordination is intact. Coordination normal.     Gait: Gait is intact. Gait normal.     Deep Tendon Reflexes: Strength normal.     Comments: Gait sure and steady in clinic; in/out of chair without difficulty; bilateral hand grasp equal 5/5  Psychiatric:        Attention and Perception: Attention and perception normal.        Mood and Affect: Mood and affect, mood and affect normal.        Speech: Speech normal.        Behavior: Behavior normal. Behavior is cooperative.        Thought Content: Thought content normal.        Cognition and Memory: Cognition, memory and cognition and memory normal.        Judgment: Judgment normal.      Clear BBS but patient feels short of breath sp02 steady 98% while talking rare nonproductive non harsh cough in clinic spoke full sentences without difficulty     Assessment & Plan:  A-mild intermittent asthma with acute exacerbation, obesity, former smoker, elevated blood pressure with hypertension diagnosis  P-congratulated patient on continuing to not smoke and encouraged her to continue tobacco cessation.  Discussed still having post nasal drip which is causing cough.  Continue flonase 1 spray each nostril BID, nasal saline 2 sprays each nostril q2h wa, phenylehrine 5-10mg  po q6h prn rhinitis.  Consider Rx singulair 10mg  po qhs discussed risk/benefits with patient and black box warning.  Restart prednisone 10mg  take 2 po qam with breakfast x 5 days #21 RF0 dispensed from [facility] to  patient.  Refilled albuterol inhaler 1-2 puffs po q4-6h prn cough protracted, wheeze, chest tightness and/or shortness of breath.  Finish amoxicillin 875mg  po BID.    Follow up if no improvement and/or worsening symptoms.  Discussed start humidifier use at home in bedroom low humidity, cold nighttime temps and HVAC dry air irritate bronchials further.  Exitcare handout preventing asthma  attack, bronchitis, MDI use. Cough lozenges po prn cough per manufacturer instructions  Discussed possible side effects prednisone increased/decreased appetite, difficulty sleeping, increased blood sugar, increased blood pressure and heart rate.  Albuterol MDI 1-2 puffs po q4-6h prn protracted cough/wheeze #1 RF0 side effect increased heart rate/hand tremor. Bronchitis simple, community acquired, may have started as viral (probably respiratory syncytial, parainfluenza, influenza, or adenovirus), but now evidence of acute purulent bronchitis with resultant bronchial edema and mucus formation.  Viruses are the most common cause of bronchial inflammation in otherwise healthy adults with acute bronchitis.  The appearance of sputum is not predictive of whether a bacterial infection is present.  Purulent sputum is most often caused by viral infections.  There are a small portion of those caused by non-viral agents being Mycoplama pneumonia.  Microscopic examination or C&S of sputum in the healthy adult with acute bronchitis is generally not helpful (usually negative or normal respiratory flora) other considerations being cough from upper respiratory tract infections, sinusitis or allergic syndromes (mild asthma or viral pneumonia).  Differential Diagnoses:  reactive airway disease (asthma, allergic aspergillosis (eosinophilia), chronic bronchitis, respiratory infection (sinusitis, common cold, pneumonia), congestive heart failure, reflux esophagitis, bronchogenic tumor, aspiration syndromes and/or exposure to pulmonary  irritants/smoke.  Without high fever, severe dyspnea, lack of physical findings or other risk factors, I will hold on a chest radiograph and CBC at this time.  I discussed that approximately 50% of patients with acute bronchitis have a cough that lasts up to three weeks, and 25% for over a month.  Tylenol 500mg  one to two tablets every four to six hours as needed for fever or myalgias. ER if hemopthysis, worsening SOB not relieved with rest/inhaler, and/or worst chest pain of life.  Patient verbalized agreement and understanding of treatment plan and had no further questions at this time.  P2:  hand washing and cover cough  Follow up for BP check after acute illness resolved and off prednisone/OTC cough/cold medication. ER if chest pain, worst headache of life, dyspnea or visual changes for re-evaluation.  Will see RN in 2 weeks for repeat BP check. Patient verbalized understanding information/instructions, agreed with plan of care and had no further questions at this time.  Patient attempting to lose weight but has been on oral prednisone  Weight steady.  Continue exercise and healthy dietary choices.  Patient verbalized understanding information/instructions and had no further questions at this time.

## 2020-11-26 ENCOUNTER — Telehealth: Payer: Self-pay | Admitting: *Deleted

## 2020-11-26 NOTE — Telephone Encounter (Signed)
RN notified by HR that pt made them aware that her father tested positive for covid on 12/28. She was in contact with her father that same day. No exposure since 12/28. Pt asymptomatic.   Pt's 2nd dose vaccine 03/08/20. No booster.   Day 1 exposure: 12/28 Day 1 quarantine: 12/28 Day 5 quarantine: 11/27/20 RTW: 11/29/20 with strict mask use for additional 5 days through 12/02/20 including no eating in common breakroom, must eat in vehicle or outside.   Discussed testing as she is at Day 4 from exposure and test availability is very limited. She reports having a site near her home that is walk in and will plan to test tomorrow 1/1 if open, or Monday 1/3. She did have a test done Wed 12/29 that was negative but was too early from last exposure.   She does have Covid booster scheduled for Monday 11/29/20. Advised pt if she is still asymptomatic by appt date, she can attend.

## 2020-11-27 ENCOUNTER — Encounter: Payer: Self-pay | Admitting: *Deleted

## 2020-11-27 NOTE — Telephone Encounter (Signed)
Patient contacted via telephone stated father very sick in hospital will not be going into work this next week and cannot get tested right now due to at hospital with father.  Patient denied covid symptoms at this time.  Discussed with patient to notify clinic staff if symptoms develop as recommend covid testing day 3 of symptoms PCR.  HR team replacements notified patient reported father in hospital and she is not planning to come into work this week.  Not cleared to return to work until covid test performed.  Patient verbalized understanding information/instructions and had no further questions at this time.  Reviewed RN Rolly Salter note reviewed and agreed with plan of care at that time but with new information and patient visiting covid father in hospital not cleared to return to work until repeat covid testing confirmed still negative.

## 2020-11-29 NOTE — Telephone Encounter (Signed)
Noted. No RTW until negative Covid test. HR updated with that requirement as well.

## 2020-12-02 NOTE — Telephone Encounter (Signed)
HR Tim Astronomer notifying that he spoke with pt 1/5 and she reported negative Covid test and no exposure to father since original exposure 12/28. HR cleared pt to RTW today 1/6 with strict mask use.

## 2020-12-02 NOTE — Telephone Encounter (Signed)
Reviewed RN Rolly Salter note agree with plan of care test negative and RTW expected today 12/02/2020

## 2020-12-04 NOTE — Telephone Encounter (Signed)
Patient reported returned to work x 3 days now and no issues/no symptoms.  Father in hospice and taking each day one at a time.  He had a good day today.  Patient denied other questions or concerns for me today.  tcon closed.

## 2020-12-14 NOTE — Telephone Encounter (Signed)
RN Rolly Salter will follow up with patient as BP/weight check still pending.

## 2020-12-23 ENCOUNTER — Other Ambulatory Visit: Payer: Self-pay | Admitting: Gastroenterology

## 2020-12-23 ENCOUNTER — Other Ambulatory Visit: Payer: Self-pay

## 2020-12-23 ENCOUNTER — Other Ambulatory Visit (HOSPITAL_COMMUNITY): Payer: Self-pay | Admitting: Gastroenterology

## 2020-12-23 ENCOUNTER — Ambulatory Visit: Payer: Self-pay | Admitting: *Deleted

## 2020-12-23 DIAGNOSIS — R030 Elevated blood-pressure reading, without diagnosis of hypertension: Secondary | ICD-10-CM

## 2020-12-23 DIAGNOSIS — R1011 Right upper quadrant pain: Secondary | ICD-10-CM

## 2020-12-23 NOTE — Progress Notes (Signed)
Routine labs per [facility] orders. Rx in paper chart. Will route to MD Charna Elizabeth upon receipt.

## 2020-12-24 LAB — BASIC METABOLIC PANEL
BUN/Creatinine Ratio: 13 (ref 9–23)
BUN: 10 mg/dL (ref 6–24)
CO2: 19 mmol/L — ABNORMAL LOW (ref 20–29)
Calcium: 9.2 mg/dL (ref 8.7–10.2)
Chloride: 106 mmol/L (ref 96–106)
Creatinine, Ser: 0.75 mg/dL (ref 0.57–1.00)
GFR calc Af Amer: 104 mL/min/{1.73_m2} (ref >59)
GFR calc non Af Amer: 90 mL/min/{1.73_m2} (ref >59)
Glucose: 204 mg/dL — ABNORMAL HIGH (ref 65–99)
Potassium: 4.2 mmol/L (ref 3.5–5.2)
Sodium: 139 mmol/L (ref 134–144)

## 2020-12-24 LAB — CBC WITH DIFFERENTIAL/PLATELET
Basophils Absolute: 0 10*3/uL (ref 0.0–0.2)
Basos: 0 %
EOS (ABSOLUTE): 0.1 10*3/uL (ref 0.0–0.4)
Eos: 1 %
Hematocrit: 37.5 % (ref 34.0–46.6)
Hemoglobin: 12.9 g/dL (ref 11.1–15.9)
Immature Grans (Abs): 0 10*3/uL (ref 0.0–0.1)
Immature Granulocytes: 0 %
Lymphocytes Absolute: 2.1 10*3/uL (ref 0.7–3.1)
Lymphs: 31 %
MCH: 30.3 pg (ref 26.6–33.0)
MCHC: 34.4 g/dL (ref 31.5–35.7)
MCV: 88 fL (ref 79–97)
Monocytes Absolute: 0.5 10*3/uL (ref 0.1–0.9)
Monocytes: 8 %
Neutrophils Absolute: 3.9 10*3/uL (ref 1.4–7.0)
Neutrophils: 60 %
Platelets: 294 10*3/uL (ref 150–450)
RBC: 4.26 x10E6/uL (ref 3.77–5.28)
RDW: 12.3 % (ref 11.7–15.4)
WBC: 6.7 10*3/uL (ref 3.4–10.8)

## 2020-12-24 LAB — HEPATIC FUNCTION PANEL
ALT: 19 IU/L (ref 0–32)
AST: 21 IU/L (ref 0–40)
Albumin: 4.2 g/dL (ref 3.8–4.9)
Alkaline Phosphatase: 75 IU/L (ref 44–121)
Bilirubin Total: 0.2 mg/dL (ref 0.0–1.2)
Bilirubin, Direct: <0.1 mg/dL (ref 0.00–0.40)
Total Protein: 6.7 g/dL (ref 6.0–8.5)

## 2020-12-24 LAB — TSH: TSH: 1.03 u[IU]/mL (ref 0.450–4.500)

## 2020-12-24 NOTE — Progress Notes (Signed)
Results routed to ordering MD. No answer by pt when calling to review results. LVMRCB.

## 2021-01-06 ENCOUNTER — Ambulatory Visit (HOSPITAL_COMMUNITY): Payer: PRIVATE HEALTH INSURANCE

## 2021-01-06 ENCOUNTER — Encounter (HOSPITAL_COMMUNITY): Payer: Self-pay

## 2021-01-20 ENCOUNTER — Other Ambulatory Visit: Payer: Self-pay

## 2021-01-20 ENCOUNTER — Ambulatory Visit: Payer: Self-pay | Admitting: *Deleted

## 2021-01-20 VITALS — BP 127/84 | HR 84

## 2021-01-20 DIAGNOSIS — R030 Elevated blood-pressure reading, without diagnosis of hypertension: Secondary | ICD-10-CM

## 2021-01-20 NOTE — Progress Notes (Signed)
Routine BP check per patient request. Set-up weekly monitoring per PCP recommendations.

## 2021-01-21 ENCOUNTER — Ambulatory Visit: Payer: Self-pay | Admitting: *Deleted

## 2021-01-28 ENCOUNTER — Ambulatory Visit: Payer: Self-pay | Admitting: *Deleted

## 2021-01-28 ENCOUNTER — Ambulatory Visit (HOSPITAL_COMMUNITY): Admission: RE | Admit: 2021-01-28 | Payer: PRIVATE HEALTH INSURANCE | Source: Ambulatory Visit

## 2021-01-28 ENCOUNTER — Other Ambulatory Visit: Payer: Self-pay

## 2021-01-28 ENCOUNTER — Ambulatory Visit (HOSPITAL_COMMUNITY): Payer: PRIVATE HEALTH INSURANCE

## 2021-01-28 VITALS — BP 137/95 | HR 97 | Wt 216.0 lb

## 2021-01-28 DIAGNOSIS — I10 Essential (primary) hypertension: Secondary | ICD-10-CM

## 2021-01-28 NOTE — Progress Notes (Signed)
EE states she did not just come from exercising as previous visit. Used manual large cuff to recheck BP with 138/86 results. Checked previous BP and Wt readings. Discussed proper cuff size for accurate bp readings. Follow-up, as needed.

## 2021-02-11 ENCOUNTER — Other Ambulatory Visit: Payer: Self-pay

## 2021-02-11 ENCOUNTER — Ambulatory Visit: Payer: Self-pay | Admitting: *Deleted

## 2021-02-11 VITALS — BP 133/111 | HR 90

## 2021-02-11 DIAGNOSIS — R030 Elevated blood-pressure reading, without diagnosis of hypertension: Secondary | ICD-10-CM

## 2021-02-11 NOTE — Progress Notes (Addendum)
Routine BP check per pt request. BP elevated today. She reports eating soup and pretzels recently this week with high sodium content. Has stopped eating these and increasing water intake. Had bilateral LE and hand swelling earlier in the week and this is improving since increasing fluid and urination.  Plans to RTC in one week to recheck.

## 2021-02-18 ENCOUNTER — Ambulatory Visit: Payer: Self-pay | Admitting: *Deleted

## 2021-02-18 ENCOUNTER — Other Ambulatory Visit: Payer: Self-pay

## 2021-02-18 VITALS — BP 147/93 | HR 77 | Wt 211.0 lb

## 2021-02-18 DIAGNOSIS — R030 Elevated blood-pressure reading, without diagnosis of hypertension: Secondary | ICD-10-CM

## 2021-02-18 NOTE — Progress Notes (Signed)
Routine BP check per pcp request while monitoring.

## 2021-02-25 ENCOUNTER — Other Ambulatory Visit: Payer: Self-pay

## 2021-02-25 ENCOUNTER — Ambulatory Visit: Payer: Self-pay | Admitting: *Deleted

## 2021-02-25 VITALS — BP 122/84

## 2021-02-25 DIAGNOSIS — R030 Elevated blood-pressure reading, without diagnosis of hypertension: Secondary | ICD-10-CM

## 2021-02-25 NOTE — Progress Notes (Signed)
Routine BP check per pt request. 

## 2021-03-04 ENCOUNTER — Ambulatory Visit: Payer: Self-pay | Admitting: *Deleted

## 2021-03-04 ENCOUNTER — Other Ambulatory Visit: Payer: Self-pay

## 2021-03-04 VITALS — BP 119/90 | HR 95

## 2021-03-04 DIAGNOSIS — R030 Elevated blood-pressure reading, without diagnosis of hypertension: Secondary | ICD-10-CM

## 2021-03-04 NOTE — Progress Notes (Signed)
Routine BP check per pt request. 

## 2021-03-18 ENCOUNTER — Other Ambulatory Visit: Payer: Self-pay

## 2021-03-18 ENCOUNTER — Ambulatory Visit: Payer: Self-pay | Admitting: *Deleted

## 2021-03-18 VITALS — BP 112/87 | HR 87 | Wt 207.0 lb

## 2021-03-18 DIAGNOSIS — I10 Essential (primary) hypertension: Secondary | ICD-10-CM

## 2021-03-18 NOTE — Progress Notes (Signed)
Routine weekly BP check per pt request.

## 2021-04-03 ENCOUNTER — Telehealth: Payer: Self-pay | Admitting: Registered Nurse

## 2021-04-03 DIAGNOSIS — Z6839 Body mass index (BMI) 39.0-39.9, adult: Secondary | ICD-10-CM

## 2021-04-03 DIAGNOSIS — R739 Hyperglycemia, unspecified: Secondary | ICD-10-CM

## 2021-04-03 DIAGNOSIS — I10 Essential (primary) hypertension: Secondary | ICD-10-CM

## 2021-04-03 DIAGNOSIS — E1165 Type 2 diabetes mellitus with hyperglycemia: Secondary | ICD-10-CM

## 2021-04-15 ENCOUNTER — Other Ambulatory Visit: Payer: Self-pay

## 2021-04-15 ENCOUNTER — Ambulatory Visit: Payer: Self-pay | Admitting: *Deleted

## 2021-04-15 VITALS — BP 123/96 | HR 87

## 2021-04-15 DIAGNOSIS — I10 Essential (primary) hypertension: Secondary | ICD-10-CM

## 2021-04-15 DIAGNOSIS — R739 Hyperglycemia, unspecified: Secondary | ICD-10-CM

## 2021-04-15 NOTE — Progress Notes (Signed)
Routine BP check and pt brings business card from Ob/[facility]yn with A1c written on the back of it reporting NP Tish Frederickson is requesting A1c. Pending orders for A1c and microalbumin/creatinine ratio from NP Tina. Both collected today. Will route results to Gyn NP upon receipt.

## 2021-04-16 ENCOUNTER — Encounter: Payer: Self-pay | Admitting: Registered Nurse

## 2021-04-16 LAB — HEMOGLOBIN A1C
Est. average glucose Bld gHb Est-mCnc: 146 mg/dL
Hgb A1c MFr Bld: 6.7 % — ABNORMAL HIGH (ref 4.8–5.6)

## 2021-04-16 LAB — MICROALBUMIN / CREATININE URINE RATIO
Creatinine, Urine: 60.9 mg/dL
Microalb/Creat Ratio: 13 mg/g creat (ref 0–29)
Microalbumin, Urine: 7.8 ug/mL

## 2021-04-19 ENCOUNTER — Other Ambulatory Visit: Payer: Self-pay | Admitting: Nurse Practitioner

## 2021-04-19 DIAGNOSIS — Z9189 Other specified personal risk factors, not elsewhere classified: Secondary | ICD-10-CM

## 2021-04-22 ENCOUNTER — Other Ambulatory Visit: Payer: Self-pay

## 2021-04-22 ENCOUNTER — Ambulatory Visit: Payer: Self-pay | Admitting: *Deleted

## 2021-04-22 VITALS — BP 128/86

## 2021-04-22 DIAGNOSIS — R030 Elevated blood-pressure reading, without diagnosis of hypertension: Secondary | ICD-10-CM

## 2021-04-22 NOTE — Progress Notes (Signed)
Routine BP check per pt request. 

## 2021-04-28 NOTE — Telephone Encounter (Signed)
Patient had labs drawn 04/15/2021.  See epic note.

## 2021-06-21 ENCOUNTER — Encounter: Payer: Self-pay | Admitting: *Deleted

## 2021-06-21 ENCOUNTER — Telehealth: Payer: Self-pay | Admitting: *Deleted

## 2021-06-21 DIAGNOSIS — N898 Other specified noninflammatory disorders of vagina: Secondary | ICD-10-CM

## 2021-06-21 MED ORDER — FLUCONAZOLE 150 MG PO TABS: 150.0000 mg | ORAL_TABLET | Freq: Once | ORAL | 0 refills | Status: AC

## 2021-06-21 NOTE — Telephone Encounter (Signed)
Pt c/o yeast infection sx x1 week after going to the beach and being in the sand, saltwater and in the pool. Sts this happens frequently for her in these conditions. She reports external burning, irritation, typical sx for her.  Case discussed verbally with NP 7/25. Was awaiting pt to return call for pharmacy confirmation. Pt confirmed pharmacy and persistent sx today. Rx placed. Pt to notify clinic in 3 days If sx do not fully resolve.

## 2021-06-21 NOTE — Telephone Encounter (Signed)
Reviewed RN Rolly Salter note agreed with plan of care.  Pelvic exams/wet prep not available at [facility] clinic if symptoms do not respond to treatment follow up with [facility] provider for re-evaluation/pelvic and wet prep.  Signed diflucan rx electronic for patient.

## 2021-06-23 ENCOUNTER — Ambulatory Visit: Payer: Self-pay | Admitting: Registered Nurse

## 2021-06-23 ENCOUNTER — Encounter: Payer: Self-pay | Admitting: Registered Nurse

## 2021-06-23 ENCOUNTER — Other Ambulatory Visit: Payer: Self-pay

## 2021-06-23 ENCOUNTER — Ambulatory Visit: Payer: Self-pay | Admitting: *Deleted

## 2021-06-23 VITALS — BP 116/83 | HR 91 | Ht 61.0 in | Wt 211.0 lb

## 2021-06-23 VITALS — BP 116/83 | HR 91

## 2021-06-23 DIAGNOSIS — R140 Abdominal distension (gaseous): Secondary | ICD-10-CM | POA: Insufficient documentation

## 2021-06-23 DIAGNOSIS — M25562 Pain in left knee: Secondary | ICD-10-CM

## 2021-06-23 DIAGNOSIS — R7303 Prediabetes: Secondary | ICD-10-CM

## 2021-06-23 DIAGNOSIS — R142 Eructation: Secondary | ICD-10-CM | POA: Insufficient documentation

## 2021-06-23 DIAGNOSIS — R12 Heartburn: Secondary | ICD-10-CM

## 2021-06-23 DIAGNOSIS — Z0000 Encounter for general adult medical examination without abnormal findings: Secondary | ICD-10-CM

## 2021-06-23 DIAGNOSIS — N898 Other specified noninflammatory disorders of vagina: Secondary | ICD-10-CM

## 2021-06-23 DIAGNOSIS — R141 Gas pain: Secondary | ICD-10-CM | POA: Insufficient documentation

## 2021-06-23 DIAGNOSIS — Z79899 Other long term (current) drug therapy: Secondary | ICD-10-CM

## 2021-06-23 DIAGNOSIS — G8929 Other chronic pain: Secondary | ICD-10-CM

## 2021-06-23 MED ORDER — MELOXICAM 7.5 MG PO TABS: 7.5000 mg | ORAL_TABLET | Freq: Every day | ORAL | 0 refills | Status: DC

## 2021-06-23 NOTE — Progress Notes (Signed)
Subjective:    Patient ID: Stephanie Chandler, female    DOB: Mar 18, 1966, 55 y.o.   MRN: [id number]  55y/o African American established female pt requesting referral to GI. Reports she has seen Dr. Arty Baumgartner at [facility] in the past but does not wish to return there. Would like to be referred to [facility]. She reports noticing an increase in her reflux symptoms and also bad breath which she was told by Dr. Loreta Ave previously is related to her stomach. Heartburn usually 1900 feels it coming up throat but doesn't have acid or food reflux into mouth  'burning in throat'  rare to have actual epigastric pain; forgets to take her omeprazole daily averages 3 days per week  Requesting refill on omeprazole 20mg  po daily has run out.  Wants to start walking/exercising again but knee pain prohibitive to exercise.  Current pain 6/10 right greater than left knee pain.  Denied redness/swelling/locking or giving out.  Has tried nsaids diclofenac, mobic, ibuprofen and restarted tylenol arthritis this week.  Would like to try mobic while waiting to see orthopedics.  Saw [facility] in the past for knees.  Saw her chiropractor and was given heels lifts to help with hip pain and they helped knees somewhat also.    Sees her PCM next week.  [facility] labs were drawn today fasting.     Review of Systems  Constitutional:  Negative for activity change, appetite change, chills, diaphoresis, fatigue and fever.  HENT:  Negative for congestion, trouble swallowing and voice change.   Eyes:  Negative for photophobia and visual disturbance.  Respiratory:  Negative for cough, shortness of breath, wheezing and stridor.   Cardiovascular:  Negative for chest pain.  Gastrointestinal:  Positive for abdominal pain. Negative for diarrhea, nausea and vomiting.  Endocrine: Negative for cold intolerance and heat intolerance.  Genitourinary:  Negative for difficulty urinating.  Musculoskeletal:  Positive for arthralgias and  myalgias. Negative for gait problem, joint swelling, neck pain and neck stiffness.  Skin:  Negative for rash.  Allergic/Immunologic: Positive for environmental allergies. Negative for food allergies.  Neurological:  Negative for dizziness, tremors, seizures, syncope, facial asymmetry, speech difficulty, weakness, light-headedness, numbness and headaches.  Hematological:  Negative for adenopathy. Does not bruise/bleed easily.  Psychiatric/Behavioral:  Negative for agitation, confusion and sleep disturbance.       Objective:   Physical Exam Vitals and nursing note reviewed.  Constitutional:      General: She is awake. She is not in acute distress.    Appearance: Normal appearance. She is well-developed and well-groomed. She is obese. She is not ill-appearing, toxic-appearing or diaphoretic.  HENT:     Head: Normocephalic and atraumatic.     Jaw: There is normal jaw occlusion.     Salivary Glands: Right salivary gland is not diffusely enlarged. Left salivary gland is not diffusely enlarged.     Right Ear: Hearing and external ear normal. A middle ear effusion is present.     Left Ear: Hearing and external ear normal. A middle ear effusion is present.     Nose: Nose normal. No congestion or rhinorrhea.     Mouth/Throat:     Lips: Pink. No lesions.     Mouth: Mucous membranes are moist. No oral lesions or angioedema.     Dentition: No dental caries, dental abscesses or gum lesions.     Tongue: No lesions. Tongue does not deviate from midline.     Palate: No mass and  lesions.     Pharynx: Uvula midline. Pharyngeal swelling and posterior oropharyngeal erythema present. No oropharyngeal exudate or uvula swelling.     Tonsils: No tonsillar exudate.     Comments: Cobblestoning posterior pharynx; bilateral TMs air fluid level clear; bilateral allergic shiners;  Eyes:     General: Lids are normal. Vision grossly intact. Gaze aligned appropriately. Allergic shiner present. No visual field deficit  or scleral icterus.       Right eye: No discharge.        Left eye: No discharge.     Extraocular Movements: Extraocular movements intact.     Conjunctiva/sclera: Conjunctivae normal.     Pupils: Pupils are equal, round, and reactive to light.  Neck:     Trachea: Trachea and phonation normal. No tracheal deviation.  Cardiovascular:     Rate and Rhythm: Normal rate and regular rhythm.     Pulses: Normal pulses.          Radial pulses are 2+ on the right side and 2+ on the left side.  Pulmonary:     Effort: Pulmonary effort is normal. No respiratory distress.     Breath sounds: Normal breath sounds and air entry. No stridor or transmitted upper airway sounds. No wheezing.     Comments: Spoke full sentences without difficulty; no cough observed in exam room Abdominal:     General: Abdomen is flat. There is no distension.     Palpations: Abdomen is soft.     Tenderness: There is no guarding.  Musculoskeletal:        General: Normal range of motion.     Right elbow: No swelling, deformity, effusion or lacerations. Normal range of motion.     Left elbow: No swelling, deformity, effusion or lacerations. Normal range of motion.     Right hand: No swelling, deformity or lacerations. Normal range of motion. Normal strength.     Left hand: No swelling, deformity or lacerations. Normal range of motion. Normal strength.     Cervical back: Normal range of motion and neck supple. No swelling, edema, deformity, erythema, signs of trauma, lacerations, rigidity, torticollis or crepitus. No pain with movement. Normal range of motion.     Thoracic back: No swelling, edema, deformity, signs of trauma or lacerations. Normal range of motion.     Right knee: No swelling, deformity, effusion, erythema, ecchymosis or lacerations. Normal range of motion.     Left knee: No swelling, deformity, effusion, erythema, ecchymosis or lacerations. Normal range of motion.     Right lower leg: No swelling, deformity,  lacerations or tenderness. No edema.     Left lower leg: No swelling, deformity, lacerations or tenderness. No edema.  Lymphadenopathy:     Head:     Right side of head: No submandibular or preauricular adenopathy.     Left side of head: No submandibular or preauricular adenopathy.     Cervical:     Right cervical: No superficial cervical adenopathy.    Left cervical: No superficial cervical adenopathy.  Skin:    General: Skin is warm and dry.     Capillary Refill: Capillary refill takes less than 2 seconds.     Coloration: Skin is not ashen, cyanotic, jaundiced, mottled, pale or sallow.     Findings: No abrasion, abscess, acne, bruising, burn, ecchymosis, erythema, signs of injury, laceration, lesion, petechiae, rash or wound.     Nails: There is no clubbing.  Neurological:     General: No focal deficit present.  Mental Status: She is alert and oriented to person, place, and time. Mental status is at baseline.     GCS: GCS eye subscore is 4. GCS verbal subscore is 5. GCS motor subscore is 6.     Cranial Nerves: Cranial nerves are intact. No cranial nerve deficit, dysarthria or facial asymmetry.     Sensory: Sensation is intact. No sensory deficit.     Motor: Motor function is intact. No weakness, tremor, atrophy, abnormal muscle tone or seizure activity.     Coordination: Coordination is intact. Coordination normal.     Gait: Gait is intact. Gait normal.     Comments: In/out of chair and on/off exam table without difficulty; gait sure and steady in clinic; bilateral hand grasp equal 5/5  Psychiatric:        Attention and Perception: Attention and perception normal.        Mood and Affect: Mood and affect normal.        Speech: Speech normal.        Behavior: Behavior normal. Behavior is cooperative.        Thought Content: Thought content normal.        Cognition and Memory: Cognition and memory normal.        Judgment: Judgment normal.          Assessment & Plan:    A-chronic knee pain bilateral and heartburn  P-discussed with patient recommend orthopedics re-evaluation knees as due to obesity would not be uncommon for her to have arthritis; has failed NSAIDS in the past but could not remember mobic effect and since tylenol arthritis not working well would like to restart while waiting for orthopedics appt.  Electronic Rx mobic 7.5mg  po daily #90 RF0 sent to her pharmacy of choice.  Heat typically helps arthritis stiffness if pain/swelling consider ice/compression. Patient going to return to her provider at [facility].  Discussed other exercise that may be easier than walking would be seated bike, water exercise classes.  Patient lives near [facility] will check to see if they have classes available.  Also discussed [employer] and new [place] [facility] at [facility].  Continue weight loss efforts.  Exitcare handouts printed and given on arthritis.  Refilled omeprazole 20mg  DR po daily #90 RF0 dispensed from [facility] to patient.  Ambulatory referral to [facility] patient preference/network provider.  Anti-reflux measures such as raising the head of the bed, avoiding tight clothing or belts, avoiding eating late at night and not lying down shortly after mealtime and achieving weight loss helpful to many patients. Avoid ASA, NSAID's, caffeine, peppermints, alcohol and tobacco. OTC H2 blockers and/or antacids are often very helpful for PRN use.  However, for chronic or daily symptoms, prescription strength H2 blockers or a trial of PPI's should be used. Magnesium level sent to [facility] today.  Exitcare handout printed and given on heartburn to patient. Patient should alert me if there are persistent symptoms, dysphagia, weight loss or GI bleeding (bright red or black). Follow up with clinic provider or PCM if symptoms persist despite therapy. Patient verbalized agreement and understanding of treatment plan and had no further questions at this time.      P2:  Diet, Food  avoidance that aggravate condition, and Fitness

## 2021-06-23 NOTE — Addendum Note (Signed)
Addended by: Loraine Grip on: 06/23/2021 01:35 PM   Modules accepted: Orders

## 2021-06-23 NOTE — Patient Instructions (Signed)
[email or web address]">  Heartburn Heartburn is a type of pain or discomfort that can happen in the throat or chest. It is often described as a burning pain. It may also cause a bad, acid-like taste in the mouth. Heartburn may feel worse when you lie down or bend over, and it is often worse at night. Heartburn may be caused by stomach contents that move back up into the esophagus (reflux). Follow these instructions at home: Eating and drinking  Avoid certain foods and drinks as told by your health care provider. This may include: Coffee and tea, with or without caffeine. Drinks that contain alcohol. Energy drinks and sports drinks. Carbonated drinks or sodas. Chocolate and cocoa. Peppermint and mint flavorings. Garlic and onions. Horseradish. Spicy and acidic foods, including peppers, chili powder, curry powder, vinegar, hot sauces, and barbecue sauce. Citrus fruit juices and citrus fruits, such as oranges, lemons, and limes. Tomato-based foods, such as red sauce, chili, salsa, and pizza with red sauce. Fried and fatty foods, such as donuts, french fries, potato chips, and high-fat dressings. High-fat meats, such as hot dogs and fatty cuts of red and white meats, such as rib eye steak, sausage, ham, and bacon. High-fat dairy items, such as whole milk, butter, and cream cheese. Eat small, frequent meals instead of large meals. Avoid drinking large amounts of liquid with your meals. Avoid eating meals during the 2-3 hours before bedtime. Avoid lying down right after you eat. Do not exercise right after you eat.  Lifestyle     If you are overweight, reduce your weight to an amount that is healthy for you. Ask your health care provider for guidance about a safe weight loss goal. Do not use any products that contain nicotine or tobacco. These products include cigarettes, chewing tobacco, and vaping devices, such as e-cigarettes. These can make symptoms  worse. If you need help quitting, ask your health care provider. Wear loose-fitting clothing. Do not wear anything tight around your waist that causes pressure on your abdomen. Raise (elevate) the head of your bed about 6 inches (15 cm) when you sleep. You can use a wedge to do this. Try to reduce your stress, such as with yoga or meditation. If you need help reducing stress, ask your health care provider. Medicines Take over-the-counter and prescription medicines only as told by your health care provider. Do not take aspirin or NSAIDs, such as ibuprofen, unless your health care provider told you to do so. Stop medicines only as told by your health care provider. If you stop taking some medicines too quickly, your symptoms may get worse. General instructions Pay attention to any changes in your symptoms. Keep all follow-up visits. This is important. Contact a health care provider if: You have new symptoms. You have unexplained weight loss. You have difficulty swallowing, or it hurts to swallow. You have wheezing or a persistent cough. Your symptoms do not improve with treatment. You have frequent heartburn for more than 2 weeks. Get help right away if: You suddenly have pain in your arms, neck, jaw, teeth, or back. You suddenly feel sweaty, dizzy, or light-headed. You have chest pain or shortness of breath. You vomit and your vomit looks like blood or coffee grounds. Your stool is bloody or black. These symptoms may represent a serious problem that is an emergency. Do not wait to see if the symptoms will go away. Get medical help right away. Call your local emergency services (911 in the [place] Do not drive yourself to the  hospital. Summary Heartburn is a type of pain or discomfort that can happen in the throat or chest. It is often described as a burning pain. It may also cause a bad, acid-like taste in the mouth. Avoid certain foods and drinks as told by your health care provider. Take  over-the-counter and prescription medicines only as told by your health care provider. Do not take aspirin or NSAIDs, such as ibuprofen, unless your health care provider told you to do so. Contact a health care provider if your symptoms do not improve or they get worse. This information is not intended to replace advice given to you by your health care provider. Make sure you discuss any questions you have with your healthcare provider. Document Revised: 05/19/2020 Document Reviewed: 05/19/2020 Elsevier Patient Education  2022 Elsevier Inc. Osteoarthritis  Osteoarthritis is a type of arthritis. It refers to joint pain or joint disease. Osteoarthritis affects tissue that covers the ends of bones in joints (cartilage). Cartilage acts as a cushion between the bones and helps them move smoothly. Osteoarthritis occurs when cartilage in the joints gets worn down.Osteoarthritis is sometimes called "wear and tear" arthritis. Osteoarthritis is the most common form of arthritis. It often occurs in older people. It is a condition that gets worse over time. The joints most often affected by this condition are in the fingers, toes, hips, knees, and spine,including the neck and lower back. What are the causes? This condition is caused by the wearing down of cartilage that covers the endsof bones. What increases the risk? The following factors may make you more likely to develop this condition: Being age 92 or older. Obesity. Overuse of joints. Past injury of a joint. Past surgery on a joint. Family history of osteoarthritis. What are the signs or symptoms? The main symptoms of this condition are pain, swelling, and stiffness in the joint. Other symptoms may include: An enlarged joint. More pain and further damage caused by small pieces of bone or cartilage that break off and float inside of the joint. Small deposits of bone (osteophytes) that grow on the edges of the joint. A grating or scraping feeling  inside the joint when you move it. Popping or creaking sounds when you move. Difficulty walking or exercising. An inability to grip items, twist your hand(s), or control the movements of your hands and fingers. How is this diagnosed? This condition may be diagnosed based on: Your medical history. A physical exam. Your symptoms. X-rays of the affected joint(s). Blood tests to rule out other types of arthritis. How is this treated? There is no cure for this condition, but treatment can help control pain and improve joint function. Treatment may include a combination of therapies, such as: Pain relief techniques, such as: Applying heat and cold to the joint. Massage. A form of talk therapy called cognitive behavioral therapy (CBT). This therapy helps you set goals and follow up on the changes that you make. Medicines for pain and inflammation. The medicines can be taken by mouth or applied to the skin. They include: NSAIDs, such as ibuprofen. Prescription medicines. Strong anti-inflammatory medicines (corticosteroids). Certain nutritional supplements. A prescribed exercise program. You may work with a physical therapist. Assistive devices, such as a brace, wrap, splint, specialized glove, or cane. A weight control plan. Surgery, such as: An osteotomy. This is done to reposition the bones and relieve pain or to remove loose pieces of bone and cartilage. Joint replacement surgery. You may need this surgery if you have advanced osteoarthritis. Follow  these instructions at home: Activity Rest your affected joints as told by your health care provider. Exercise as told by your health care provider. He or she may recommend specific types of exercise, such as: Strengthening exercises. These are done to strengthen the muscles that support joints affected by arthritis. Aerobic activities. These are exercises, such as brisk walking or water aerobics, that increase your heart rate. Range-of-motion  activities. These help your joints move more easily. Balance and agility exercises. Managing pain, stiffness, and swelling     If directed, apply heat to the affected area as often as told by your health care provider. Use the heat source that your health care provider recommends, such as a moist heat pack or a heating pad. If you have a removable assistive device, remove it as told by your health care provider. Place a towel between your skin and the heat source. If your health care provider tells you to keep the assistive device on while you apply heat, place a towel between the assistive device and the heat source. Leave the heat on for 20-30 minutes. Remove the heat if your skin turns bright red. This is especially important if you are unable to feel pain, heat, or cold. You may have a greater risk of getting burned. If directed, put ice on the affected area. To do this: If you have a removable assistive device, remove it as told by your health care provider. Put ice in a plastic bag. Place a towel between your skin and the bag. If your health care provider tells you to keep the assistive device on during icing, place a towel between the assistive device and the bag. Leave the ice on for 20 minutes, 2-3 times a day. Move your fingers or toes often to reduce stiffness and swelling. Raise (elevate) the injured area above the level of your heart while you are sitting or lying down. General instructions Take over-the-counter and prescription medicines only as told by your health care provider. Maintain a healthy weight. Follow instructions from your health care provider for weight control. Do not use any products that contain nicotine or tobacco, such as cigarettes, e-cigarettes, and chewing tobacco. If you need help quitting, ask your health care provider. Use assistive devices as told by your health care provider. Keep all follow-up visits as told by your health care provider. This is  important. Where to find more information [employer] of Arthritis and Musculoskeletal and Skin Diseases: www.niams.http://www.myers.net/ [employer] on Aging: https://walker.com/ American College of Rheumatology: www.rheumatology.org Contact a health care provider if: You have redness, swelling, or a feeling of warmth in a joint that gets worse. You have a fever along with joint or muscle aches. You develop a rash. You have trouble doing your normal activities. Get help right away if: You have pain that gets worse and is not relieved by pain medicine. Summary Osteoarthritis is a type of arthritis that affects tissue covering the ends of bones in joints (cartilage). This condition is caused by the wearing down of cartilage that covers the ends of bones. The main symptom of this condition is pain, swelling, and stiffness in the joint. There is no cure for this condition, but treatment can help control pain and improve joint function. This information is not intended to replace advice given to you by your health care provider. Make sure you discuss any questions you have with your healthcare provider. Document Revised: 11/10/2019 Document Reviewed: 11/10/2019 Elsevier Patient Education  2022 [employer].

## 2021-06-24 ENCOUNTER — Encounter: Payer: Self-pay | Admitting: Nurse Practitioner

## 2021-06-24 ENCOUNTER — Encounter: Payer: Self-pay | Admitting: Registered Nurse

## 2021-06-24 DIAGNOSIS — Z6839 Body mass index (BMI) 39.0-39.9, adult: Secondary | ICD-10-CM

## 2021-06-24 DIAGNOSIS — R7303 Prediabetes: Secondary | ICD-10-CM

## 2021-06-24 LAB — CMP12+LP+TP+TSH+6AC+CBC/D/PLT
ALT: 19 IU/L (ref 0–32)
AST: 24 IU/L (ref 0–40)
Albumin/Globulin Ratio: 1.8 (ref 1.2–2.2)
Albumin: 4.4 g/dL (ref 3.8–4.9)
Alkaline Phosphatase: 72 IU/L (ref 44–121)
BUN/Creatinine Ratio: 17 (ref 9–23)
BUN: 13 mg/dL (ref 6–24)
Basophils Absolute: 0 10*3/uL (ref 0.0–0.2)
Basos: 0 %
Bilirubin Total: 0.3 mg/dL (ref 0.0–1.2)
Calcium: 9.3 mg/dL (ref 8.7–10.2)
Chloride: 103 mmol/L (ref 96–106)
Chol/HDL Ratio: 4.1 ratio (ref 0.0–4.4)
Cholesterol, Total: 175 mg/dL (ref 100–199)
Creatinine, Ser: 0.76 mg/dL (ref 0.57–1.00)
EOS (ABSOLUTE): 0.1 10*3/uL (ref 0.0–0.4)
Eos: 3 %
Estimated CHD Risk: 0.9 times avg. (ref 0.0–1.0)
Free Thyroxine Index: 1.7 (ref 1.2–4.9)
GGT: 35 IU/L (ref 0–60)
Globulin, Total: 2.5 g/dL (ref 1.5–4.5)
Glucose: 125 mg/dL — ABNORMAL HIGH (ref 65–99)
HDL: 43 mg/dL (ref >39)
Hematocrit: 40.7 % (ref 34.0–46.6)
Hemoglobin: 13.4 g/dL (ref 11.1–15.9)
Immature Grans (Abs): 0 10*3/uL (ref 0.0–0.1)
Immature Granulocytes: 0 %
Iron: 101 ug/dL (ref 27–159)
LDH: 202 IU/L (ref 119–226)
LDL Chol Calc (NIH): 118 mg/dL — ABNORMAL HIGH (ref 0–99)
Lymphocytes Absolute: 1.9 10*3/uL (ref 0.7–3.1)
Lymphs: 35 %
MCH: 29.1 pg (ref 26.6–33.0)
MCHC: 32.9 g/dL (ref 31.5–35.7)
MCV: 89 fL (ref 79–97)
Monocytes Absolute: 0.5 10*3/uL (ref 0.1–0.9)
Monocytes: 9 %
Neutrophils Absolute: 2.8 10*3/uL (ref 1.4–7.0)
Neutrophils: 53 %
Phosphorus: 2.8 mg/dL — ABNORMAL LOW (ref 3.0–4.3)
Platelets: 286 10*3/uL (ref 150–450)
Potassium: 4.5 mmol/L (ref 3.5–5.2)
RBC: 4.6 x10E6/uL (ref 3.77–5.28)
RDW: 12.6 % (ref 11.7–15.4)
Sodium: 138 mmol/L (ref 134–144)
T3 Uptake Ratio: 28 % (ref 24–39)
T4, Total: 6 ug/dL (ref 4.5–12.0)
TSH: 1.07 u[IU]/mL (ref 0.450–4.500)
Total Protein: 6.9 g/dL (ref 6.0–8.5)
Triglycerides: 76 mg/dL (ref 0–149)
Uric Acid: 4.5 mg/dL (ref 3.0–7.2)
VLDL Cholesterol Cal: 14 mg/dL (ref 5–40)
WBC: 5.3 10*3/uL (ref 3.4–10.8)
eGFR: 92 mL/min/{1.73_m2} (ref >59)

## 2021-06-24 LAB — MAGNESIUM: Magnesium: 2.2 mg/dL (ref 1.6–2.3)

## 2021-06-24 LAB — HGB A1C W/O EAG: Hgb A1c MFr Bld: 6.6 % — ABNORMAL HIGH (ref 4.8–5.6)

## 2021-06-24 NOTE — Addendum Note (Signed)
Addended by: Albina Billet A on: 06/24/2021 10:53 AM   Modules accepted: Orders

## 2021-06-24 NOTE — Telephone Encounter (Signed)
Patient spoke with RN Rolly Salter regarding test results today and requested dietitian and [facility] referrals.  Ambulatory referrals entered to both clinics today for patient.

## 2021-06-30 NOTE — Telephone Encounter (Signed)
Worsening Hgba1c now Type II diabetes and patient requested referrals to dietitian and [facility].

## 2021-06-30 NOTE — Telephone Encounter (Signed)
Patient reported diabetic education provider office already called and scheduled appt with her.  Still awaiting call from [facility] to schedule initial appt.  Spoke with provider office this am and her referral was visible to their office.

## 2021-07-13 NOTE — Telephone Encounter (Signed)
Noted in Epic colonoscopy preop 07/25/21 and dietitian 07/28/21

## 2021-07-14 NOTE — Telephone Encounter (Signed)
Follow up labs scheduled 08/25/21 per RN Rolly Salter HgbA1c and LFTs

## 2021-07-24 NOTE — Progress Notes (Addendum)
07/24/2021 Stephanie Chandler [id number] [birth year]   CHIEF COMPLAINT: Bad Breath   HISTORY OF PRESENT ILLNESS:  Stephanie Kenealy. Chandler is a 55 year old female with a past medical history of arthritis, hypertension, DM II, sleep apnea does not use cpap and + Covid 19 09/2019.  Past abdominal hysterectomy and bilateral cataract surgery.   She presents to our office today as referred by Albina Billet NP for further evaluation regarding chronic halitosis and infrequent heartburn.  She was previously followed by Dr. Charna Elizabeth, she wishes to transition her GI management to [facility]. She complains of having bad breath since 2015. She was initially advised to take a probiotic per Dr. Loreta Ave in 2015 without improvement. She has switched probiotics multiple times over the past few years. She has infrequent heartburn which occurs maybe once monthly for which she takes Omeprazole 20mg  PRN. She denies ever taking a PPI consistently on a daily basis. She was recently started on Meloxicam 7.5mg  QD for arthritis without worsening heartburn symptoms. She denies having any dysphagia, upper or lower abdominal pain. She occasionally feels bloated. No constipation. Past hysterectomy, she verified both ovaries were removed. She is passing a normal formed brown stool daily. No rectal bleeding or black stools. She underwent a colonoscopy by Dr. about 5 years ago which she reported was normal. She possibly underwent 2 colonoscopies by Dr. Loreta Ave but she is not sure. No history of colon polyps. No known family history of colorectal cancer. She has seen a dentist recently, no dental issues of gingivitis. She has chronic nasal and sinus congestion. She uses Flonase nasal spray as needed. No recent antibiotics. She was recently diagnosed with DM II started on Metformin. No other complaints at this time.    CBC Latest Ref Rng & Units 06/23/2021 12/23/2020 06/24/2020  WBC 3.4 - 10.8 x10E3/uL 5.3 6.7 4.1  Hemoglobin 11.1  - 15.9 g/dL 06/26/2020 53.6 14.4  Hematocrit 34.0 - 46.6 % 40.7 37.5 38.3  Platelets 150 - 450 x10E3/uL 286 294 269    CMP Latest Ref Rng & Units 06/23/2021 12/23/2020 06/24/2020  Glucose 65 - 99 mg/dL 06/26/2020) 400(Q) 676(P)  BUN 6 - 24 mg/dL 13 10 10   Creatinine 0.57 - 1.00 mg/dL 950(D 3.26  Sodium 134 - 144 mmol/L 138 139 139  Potassium 3.5 - 5.2 mmol/L 4.5 4.2 3.9  Chloride 96 - 106 mmol/L 103 106 105  CO2 20 - 29 mmol/L - 19(L) -  Calcium 8.7 - 10.2 mg/dL 9.3 9.2 9.0  Total Protein 6.0 - 8.5 g/dL 6.9 6.7 6.7  Total Bilirubin 0.0 - 1.2 mg/dL 0.3 0.2 0.3  Alkaline Phos 44 - 121 IU/L 72 75 65  AST 0 - 40 IU/L 24 21 43(H)  ALT 0 - 32 IU/L 19 19 56(H)     Past Medical History:  Diagnosis Date   Arthritis    Past Surgical History:  Procedure Laterality Date   ABDOMINAL HYSTERECTOMY     HYSTERECTOMY ABDOMINAL WITH SALPINGECTOMY  1997   Social History: She is single. She repairs crystal. She has 2  daughters. She stopped smoking 1/2 ppd x 20 years. She drinks one glass of once monthly or less. No drug use.   Family History: Father deceased age 84 history of DM, kidney disease. CHF. Mother age 69 HTN and DM. Paternal aunts with ? Cancer.     Outpatient Encounter Medications as of 07/25/2021  Medication Sig   acetaminophen (TYLENOL) 500 MG tablet Take  500 mg by mouth every 6 (six) hours as needed for moderate pain.   albuterol (VENTOLIN HFA) 108 (90 Base) MCG/ACT inhaler Inhale 1-2 puffs into the lungs every 4 (four) hours as needed for wheezing or shortness of breath.   fluconazole (DIFLUCAN) 150 MG tablet Take 150 mg by mouth once.   fluticasone (FLONASE) 50 MCG/ACT nasal spray Place 1 spray into both nostrils 2 (two) times daily. (Patient not taking: Reported on 06/23/2021)   loratadine (CLARITIN) 10 MG tablet Take 1 tablet (10 mg total) by mouth daily.   meloxicam (MOBIC) 7.5 MG tablet Take 1 tablet (7.5 mg total) by mouth daily.   omeprazole (PRILOSEC) 20 MG capsule Take 1 capsule (20  mg total) by mouth daily. (Patient not taking: Reported on 06/23/2021)   Probiotic Product (PROBIOTIC-10 PO) Take by mouth.   sodium chloride (OCEAN) 0.65 % SOLN nasal spray Place 2 sprays into both nostrils daily as needed. (Patient not taking: Reported on 06/23/2021)   No facility-administered encounter medications on file as of 07/25/2021.    REVIEW OF SYSTEMS:  Gen: + Night sweats or chills. No fevers. No weight loss.  CV: Denies chest pain, palpitations or edema. Resp: Denies cough, shortness of breath of hemoptysis.  GI: See HPI.  GU : Denies urinary burning, blood in urine, increased urinary frequency or incontinence. MS: + Arthritis and back pain.  Derm: Denies rash, itchiness, skin lesions or unhealing ulcers. Psych: Denies depression, anxiety or memory loss.  Heme: Denies bruising, bleeding. Neuro:  Denies headaches, dizziness or paresthesias. Endo:  Denies any problems with DM, thyroid or adrenal function.   PHYSICAL EXAM: BP 132/80   Pulse 76   Ht 5\' 1"  (1.549 m)   Wt 215 lb (97.5 kg)   BMI 40.62 kg/m   General: 55 year old female in no acute distress. Head: Normocephalic and atraumatic. Eyes:  Sclerae non-icteric, conjunctive pink. Ears: Normal auditory acuity. Mouth: Dentition intact. No ulcers or lesions. No thrush.  Neck: Supple, no lymphadenopathy or thyromegaly.  Lungs: Clear bilaterally to auscultation without wheezes, crackles or rhonchi. Heart: Regular rate and rhythm. No murmur, rub or gallop appreciated.  Abdomen: Soft, non distended. Mild RLQ tenderness without rebound or guarding. No masses. No hepatosplenomegaly. Normoactive bowel sounds x 4 quadrants.  Rectal: Deferred.  Musculoskeletal: Symmetrical with no gross deformities. Skin: Warm and dry. No rash or lesions on visible extremities. Extremities: No edema. Neurological: Alert oriented x 4, no focal deficits.  Psychological:  Alert and cooperative. Normal mood and affect.  ASSESSMENT AND  PLAN:  19) 55 year old female with chronic daily halitosis since at least 2015 with infrequent heartburn -Omeprazole 20mg  QD -GERD handout -Follow up with PCP regarding nasal and sinus congestion, may be contributing to halitosis. Consider ENT evaluation.  -Biotene mouth wash  -Probiotic of choice  -Follow up with Dr. 2016 in 8 weeks, consider EGD if symptoms persist or worsen   2) Colon cancer screening  -Request copy of colonoscopy records from Dr. office, to verify colonoscopy recall date once records reviewed   3) Sleep apnea, not using CPAP  4) DM II on Metformin -Proceed with nutritionist consult as ordered by PCP  Addendum: Colonoscopy 06/28/2018 by Dr. Kenna Gilbert report received.  The colonoscopy was normal.  The quality of the bowel preparation was adequate. 10-year colonoscopy recall recommended by Dr. 08/28/2018. CC:  No ref. provider found

## 2021-07-25 ENCOUNTER — Ambulatory Visit (INDEPENDENT_AMBULATORY_CARE_PROVIDER_SITE_OTHER): Payer: No Typology Code available for payment source | Admitting: Nurse Practitioner

## 2021-07-25 ENCOUNTER — Encounter: Payer: Self-pay | Admitting: Nurse Practitioner

## 2021-07-25 VITALS — BP 132/80 | HR 76 | Ht 61.0 in | Wt 215.0 lb

## 2021-07-25 DIAGNOSIS — K219 Gastro-esophageal reflux disease without esophagitis: Secondary | ICD-10-CM | POA: Diagnosis not present

## 2021-07-25 DIAGNOSIS — R196 Halitosis: Secondary | ICD-10-CM | POA: Insufficient documentation

## 2021-07-25 NOTE — Progress Notes (Signed)
Agree with the assessment and plan as outlined by Colleen Kennedy-Smith, NP.    Tulio Facundo E. Ming Kunka, MD [facility]  

## 2021-07-25 NOTE — Patient Instructions (Addendum)
RECOMMENDATIONS:  Take Prilosec 20 MG once a day. Try using Biotene mouth wash. Follow up with your primary care provider regarding nasal and sinus congestion, may need referred to ENT. We will try to get your last colonoscopy records. We have scheduled you a follow up with Dr. Cunningham on 09/07/21 at 8:30 am.  FoTomasa Randllow GERD diet information.  It was great seeing you today! Thank you for entrusting me with your care and choosing [facility].  Arnaldo Natalolleen M. Kennedy-Smith, CRNP Gastroesophageal Reflux Disease, Adult  Gastroesophageal reflux (GER) happens when acid from the stomach flows up into the tube that connects the mouth and the stomach (esophagus). Normally, food travels down the esophagus and stays in the stomach to be digested. With GER, food and stomach acid sometimes move back up into theesophagus. You may have a disease called gastroesophageal reflux disease (GERD) if the reflux: Happens often. Causes frequent or very bad symptoms. Causes problems such as damage to the esophagus. When this happens, the esophagus becomes sore and swollen. Over time, GERD can make small holes (ulcers) in the lining of the esophagus. What are the causes? This condition is caused by a problem with the muscle between the esophagus and the stomach. When this muscle is weak or not normal, it does not close properlyto keep food and acid from coming back up from the stomach. The muscle can be weak because of: Tobacco use. Pregnancy. Having a certain type of hernia (hiatal hernia). Alcohol use. Certain foods and drinks, such as coffee, chocolate, onions, and peppermint. What increases the risk? Being overweight. Having a disease that affects your connective tissue. Taking NSAIDs, such a ibuprofen. What are the signs or symptoms? Heartburn. Difficult or painful swallowing. The feeling of having a lump in the throat. A bitter taste in the mouth. Bad breath. Having a lot of saliva. Having  an upset or bloated stomach. Burping. Chest pain. Different conditions can cause chest pain. Make sure you see your doctor if you have chest pain. Shortness of breath or wheezing. A long-term cough or a cough at night. Wearing away of the surface of teeth (tooth enamel). Weight loss. How is this treated? Making changes to your diet. Taking medicine. Having surgery. Treatment will depend on how bad your symptoms are. Follow these instructions at home: Eating and drinking  Follow a diet as told by your doctor. You may need to avoid foods and drinks such as: Coffee and tea, with or without caffeine. Drinks that contain alcohol. Energy drinks and sports drinks. Bubbly (carbonated) drinks or sodas. Chocolate and cocoa. Peppermint and mint flavorings. Garlic and onions. Horseradish. Spicy and acidic foods. These include peppers, chili powder, curry powder, vinegar, hot sauces, and BBQ sauce. Citrus fruit juices and citrus fruits, such as oranges, lemons, and limes. Tomato-based foods. These include red sauce, chili, salsa, and pizza with red sauce. Fried and fatty foods. These include donuts, french fries, potato chips, and high-fat dressings. High-fat meats. These include hot dogs, rib eye steak, sausage, ham, and bacon. High-fat dairy items, such as whole milk, butter, and cream cheese. Eat small meals often. Avoid eating large meals. Avoid drinking large amounts of liquid with your meals. Avoid eating meals during the 2-3 hours before bedtime. Avoid lying down right after you eat. Do not exercise right after you eat.  Lifestyle  Do not smoke or use any products that contain nicotine or tobacco. If you need help quitting, ask your doctor. Try to lower your stress. If you need  help doing this, ask your doctor. If you are overweight, lose an amount of weight that is healthy for you. Ask your doctor about a safe weight loss goal.  General instructions Pay attention to any changes  in your symptoms. Take over-the-counter and prescription medicines only as told by your doctor. Do not take aspirin, ibuprofen, or other NSAIDs unless your doctor says it is okay. Wear loose clothes. Do not wear anything tight around your waist. Raise (elevate) the head of your bed about 6 inches (15 cm). You may need to use a wedge to do this. Avoid bending over if this makes your symptoms worse. Keep all follow-up visits. Contact a doctor if: You have new symptoms. You lose weight and you do not know why. You have trouble swallowing or it hurts to swallow. You have wheezing or a cough that keeps happening. You have a hoarse voice. Your symptoms do not get better with treatment. Get help right away if: You have sudden pain in your arms, neck, jaw, teeth, or back. You suddenly feel sweaty, dizzy, or light-headed. You have chest pain or shortness of breath. You vomit and the vomit is green, yellow, or black, or it looks like blood or coffee grounds. You faint. Your poop (stool) is red, bloody, or black. You cannot swallow, drink, or eat. These symptoms may represent a serious problem that is an emergency. Do not wait to see if the symptoms will go away. Get medical help right away. Call your local emergency services (911 in the [place] Do not drive yourself to the hospital. Summary If a person has gastroesophageal reflux disease (GERD), food and stomach acid move back up into the esophagus and cause symptoms or problems such as damage to the esophagus. Treatment will depend on how bad your symptoms are. Follow a diet as told by your doctor. Take all medicines only as told by your doctor. This information is not intended to replace advice given to you by your health care provider. Make sure you discuss any questions you have with your healthcare provider. Document Revised: 05/24/2020 Document Reviewed: 05/24/2020 Elsevier Patient Education  2022 [employer]. Food Choices for  Gastroesophageal Reflux Disease, Adult When you have gastroesophageal reflux disease (GERD), the foods you eat and your eating habits are very important. Choosing the right foods can help ease the discomfort of GERD. Consider working with a dietitian to help you [employer] choices. What are tips for following this plan? Reading food labels Look for foods that are low in saturated fat. Foods that have less than 5% of daily value (DV) of fat and 0 g of trans fats may help with your symptoms. Cooking Cook foods using methods other than frying. This may include baking, steaming, grilling, or broiling. These are all methods that do not need a lot of fat for cooking. To add flavor, try to use herbs that are low in spice and acidity. Meal planning  Choose healthy foods that are low in fat, such as fruits, vegetables, whole grains, low-fat dairy products, lean meats, fish, and poultry. Eat frequent, small meals instead of three large meals each day. Eat your meals slowly, in a relaxed setting. Avoid bending over or lying down until 2-3 hours after eating. Limit high-fat foods such as fatty meats or fried foods. Limit your intake of fatty foods, such as oils, butter, and shortening. Avoid the following as told by your health care provider: Foods that cause symptoms. These may be different for different people. Keep  a food diary to keep track of foods that cause symptoms. Alcohol. Drinking large amounts of liquid with meals. Eating meals during the 2-3 hours before bed.  Lifestyle Maintain a healthy weight. Ask your health care provider what weight is healthy for you. If you need to lose weight, work with your health care provider to do so safely. Exercise for at least 30 minutes on 5 or more days each week, or as told by your health care provider. Avoid wearing clothes that fit tightly around your waist and chest. Do not use any products that contain nicotine or tobacco. These products include  cigarettes, chewing tobacco, and vaping devices, such as e-cigarettes. If you need help quitting, ask your health care provider. Sleep with the head of your bed raised. Use a wedge under the mattress or blocks under the bed frame to raise the head of the bed. Chew sugar-free gum after mealtimes. What foods should I eat?  Eat a healthy, well-balanced diet of fruits, vegetables, whole grains, low-fat dairy products, lean meats, fish, and poultry. Each person is different. Foods that may trigger symptoms in one person may not trigger any symptoms in another person. Work with your health care provider to identify foods that are safe foryou. The items listed above may not be a complete list of recommended foods and beverages. Contact a dietitian for more information. What foods should I avoid? Limiting some of these foods may help manage the symptoms of GERD. Everyone is different. Consult a dietitian or your health care provider to help youidentify the exact foods to avoid, if any. Fruits Any fruits prepared with added fat. Any fruits that cause symptoms. For some people this may include citrus fruits, such as oranges, grapefruit, pineapple,and lemons. Vegetables Deep-fried vegetables. [place] fries. Any vegetables prepared with added fat. Any vegetables that cause symptoms. For some people, this may include tomatoesand tomato products, chili peppers, onions and garlic, and horseradish. Grains Pastries or quick breads with added fat. Meats and other proteins High-fat meats, such as fatty beef or pork, hot dogs, ribs, ham, sausage, salami, and bacon. Fried meat or protein, including fried fish and friedchicken. Nuts and nut butters, in large amounts. Dairy Whole milk and chocolate milk. Sour cream. Cream. Ice cream. Cream cheese.Milkshakes. Fats and oils Butter. Margarine. Shortening. Ghee. Beverages Coffee and tea, with or without caffeine. Carbonated beverages. Sodas. Energy drinks. Fruit juice  made with acidic fruits, such as orange or grapefruit.Tomato juice. Alcoholic drinks. Sweets and desserts Chocolate and cocoa. Donuts. Seasonings and condiments Pepper. Peppermint and spearmint. Added salt. Any condiments, herbs, or seasonings that cause symptoms. For some people, this may include curry, hotsauce, or vinegar-based salad dressings. The items listed above may not be a complete list of foods and beverages to avoid. Contact a dietitian for more information. Questions to ask your health care provider Diet and lifestyle changes are usually the first steps that are taken to manage symptoms of GERD. If diet and lifestyle changes do not improve your symptoms,talk with your health care provider about taking medicines. Where to find more information International [employer] for Gastrointestinal Disorders: aboutgerd.org Summary When you have gastroesophageal reflux disease (GERD), food and lifestyle choices may be very helpful in easing the discomfort of GERD. Eat frequent, small meals instead of three large meals each day. Eat your meals slowly, in a relaxed setting. Avoid bending over or lying down until 2-3 hours after eating. Limit high-fat foods such as fatty meats or fried foods. This information is not  intended to replace advice given to you by your health care provider. Make sure you discuss any questions you have with your healthcare provider. Document Revised: 05/24/2020 Document Reviewed: 05/24/2020 Elsevier Patient Education  2022 Else[employer] Inc.   The [place] [facility] providers would like to encourage you to use [facility] to communicate with providers for non-urgent requests or questions.  Due to long hold times on the telephone, sending your provider a message by [facility] may be faster and more efficient way to get a response. Please allow 48 business hours for a response.  Please remember that this is for non-urgent requests/questions.

## 2021-07-28 ENCOUNTER — Encounter
Payer: No Typology Code available for payment source | Attending: Physician Assistant | Admitting: Skilled Nursing Facility1

## 2021-07-28 ENCOUNTER — Encounter: Payer: Self-pay | Admitting: Skilled Nursing Facility1

## 2021-07-28 ENCOUNTER — Other Ambulatory Visit: Payer: Self-pay

## 2021-07-28 DIAGNOSIS — E119 Type 2 diabetes mellitus without complications: Secondary | ICD-10-CM | POA: Diagnosis present

## 2021-07-28 NOTE — Progress Notes (Signed)
Pt states she needs to lose weight.  Pt states she checks her blood sugars once a day: fasting: usually 137, 128 with the highest being 138.  Pt states she is tired/exhausted all the time stating it has been getting worse having started after having covid and has night sweats.  Pt states her sleep is inconsistent sometimes waking too soon and sometimes urinating throughout the night.  Pt states she cannot not drink with bedtime due to feeling thirsty and having dry mouth stating she does have sleep apnea and will be going to a ENT. Pt states she does not wear her C-PAP regularly.  Pt state she plans to get back into the gym.  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   Goals: -check your blood sugars 2 times a day once fasting (under 130) and 2 hours after any meal looking for under 180 Always above 70 -if a reading over 200 drink 16 ounces of water or go for a walk  -start with 3 days a week for 30 minutes and add in some resistance exercises eventually getting to 4-5 days a week 45 minutes - 60 minutes  -if a reading under 70 have some fast acting sugar like candy, juice, or soda and then retest in 15 minutes and have a meal if up above 70  To discuss emotional eating next visit  Body Composition Scale 07/28/2021  Current Body Weight 215  Total Body Fat % 45.1  Visceral Fat 17  Fat-Free Mass % 54.8   Total Body Water % 41.9  Muscle-Mass lbs 27.5  BMI 27.5  Body Fat Displacement          Torso  lbs 60.1         Left Leg  lbs 12         Right Leg  lbs 12         Left Arm  lbs 6         Right Arm   lbs 6   Diabetes Self-Management Education  Visit Type: First/Initial  07/28/2021  Stephanie Chandler, identified by name and date of birth, is a 54 y.o. female with a diagnosis of Diabetes: Type 2.   ASSESSMENT  Height 5\' 1"  (1.549 m), weight 215 lb 1.6 oz (97.6 kg). Body mass index is 40.64 kg/m.   Diabetes Self-Management Education - 07/28/21 1620        Visit Information   Visit Type First/Initial      Initial Visit   Diabetes Type Type 2    Are you currently following a meal plan? No    Are you taking your medications as prescribed? Yes      Health Coping   How would you rate your overall health? Good      Psychosocial Assessment   Patient Belief/Attitude about Diabetes Motivated to manage diabetes    Self-care barriers None    Self-management support Friends;Family    Patient Concerns Nutrition/Meal planning;Weight Control    Special Needs None    Preferred Learning Style Auditory    Learning Readiness Ready    How often do you need to have someone help you when you read instructions, pamphlets, or other written materials from your doctor or pharmacy? 1 - Never      Pre-Education Assessment   Patient understands the diabetes disease and treatment process. Needs Instruction    Patient understands incorporating nutritional management into lifestyle. Needs Instruction  Patient undertands incorporating physical activity into lifestyle. Needs Instruction    Patient understands using medications safely. Needs Instruction    Patient understands monitoring blood glucose, interpreting and using results Needs Instruction    Patient understands prevention, detection, and treatment of acute complications. Needs Instruction    Patient understands prevention, detection, and treatment of chronic complications. Needs Instruction    Patient understands how to develop strategies to address psychosocial issues. Needs Instruction    Patient understands how to develop strategies to promote health/change behavior. Needs Instruction      Complications   Last HgB A1C per patient/outside source 6.6 %    How often do you check your blood sugar? 1-2 times/day    Fasting Blood glucose range (mg/dL) 619-509    Number of hypoglycemic episodes per month 0    Number of hyperglycemic episodes per week 0    Have you had a dilated eye exam in the  past 12 months? Yes    Have you had a dental exam in the past 12 months? Yes    Are you checking your feet? Yes    How many days per week are you checking your feet? 7      Dietary Intake   Breakfast granola bar or oatmeal    Snack (morning) 3-4 peanut butter crackers    Lunch 6 in sub or spagetti    Dinner fried fish + potato salad    Beverage(s) water, sweet tea, soda, wine      Exercise   Exercise Type ADL's      Patient Education   Previous Diabetes Education No    Disease state  Definition of diabetes, type 1 and 2, and the diagnosis of diabetes;Factors that contribute to the development of diabetes    Acute complications Taught treatment of hypoglycemia - the 15 rule.;Discussed and identified patients' treatment of hyperglycemia.      Individualized Goals (developed by patient)   Nutrition Follow meal plan discussed;General guidelines for healthy choices and portions discussed    Physical Activity Exercise 5-7 days per week;30 minutes per day    Monitoring  test my blood glucose as discussed;test blood glucose pre and post meals as discussed      Post-Education Assessment   Patient understands the diabetes disease and treatment process. Demonstrates understanding / competency    Patient understands incorporating nutritional management into lifestyle. Demonstrates understanding / competency    Patient undertands incorporating physical activity into lifestyle. Demonstrates understanding / competency    Patient understands using medications safely. Demonstrates understanding / competency    Patient understands monitoring blood glucose, interpreting and using results Demonstrates understanding / competency    Patient understands prevention, detection, and treatment of acute complications. Demonstrates understanding / competency    Patient understands prevention, detection, and treatment of chronic complications. Demonstrates understanding / competency    Patient understands how to  develop strategies to address psychosocial issues. Demonstrates understanding / competency    Patient understands how to develop strategies to promote health/change behavior. Demonstrates understanding / competency      Outcomes   Expected Outcomes Demonstrated interest in learning. Expect positive outcomes    Future DMSE 3-4 months    Program Status Completed             Individualized Plan for Diabetes Self-Management Training:   Learning Objective:  Patient will have a greater understanding of diabetes self-management. Patient education plan is to attend individual and/or group sessions per assessed needs and concerns.  Expected Outcomes:  Demonstrated interest in learning. Expect positive outcomes  Education material provided: ADA - How to Thrive: A Guide for Your Journey with Diabetes, My Plate, and Snack sheet  If problems or questions, patient to contact team via:  Phone and Email  Future DSME appointment: 3-4 months

## 2021-08-02 NOTE — Telephone Encounter (Signed)
Reviewed diabetes education encounter note and will reinforce with patient on subsequent clinic encounters/verify if any questions or concerns.

## 2021-08-02 NOTE — Progress Notes (Signed)
Reviewed nutrition diabetes education encounter and will follow up with patient to reinforce education

## 2021-09-07 ENCOUNTER — Ambulatory Visit: Payer: No Typology Code available for payment source | Admitting: Gastroenterology

## 2021-09-13 ENCOUNTER — Ambulatory Visit: Payer: Self-pay | Admitting: *Deleted

## 2021-09-13 ENCOUNTER — Other Ambulatory Visit: Payer: Self-pay

## 2021-09-13 DIAGNOSIS — Z79899 Other long term (current) drug therapy: Secondary | ICD-10-CM

## 2021-09-13 DIAGNOSIS — R7303 Prediabetes: Secondary | ICD-10-CM

## 2021-09-13 NOTE — Progress Notes (Signed)
Repeat labs per NP Tina's orders. Requested weight check as well.

## 2021-09-14 LAB — HEPATIC FUNCTION PANEL
ALT: 17 IU/L (ref 0–32)
AST: 21 IU/L (ref 0–40)
Albumin: 4.4 g/dL (ref 3.8–4.9)
Alkaline Phosphatase: 63 IU/L (ref 44–121)
Bilirubin Total: 0.3 mg/dL (ref 0.0–1.2)
Bilirubin, Direct: <0.1 mg/dL (ref 0.00–0.40)
Total Protein: 6.8 g/dL (ref 6.0–8.5)

## 2021-09-14 LAB — HEMOGLOBIN A1C
Est. average glucose Bld gHb Est-mCnc: 131 mg/dL
Hgb A1c MFr Bld: 6.2 % — ABNORMAL HIGH (ref 4.8–5.6)

## 2021-09-14 NOTE — Telephone Encounter (Signed)
Per GI office visit-last colonoscopy results requested to verify when next due.  She was to follow up with GI 8 weeks after last OV which is end of Oct 2022.

## 2021-09-14 NOTE — Telephone Encounter (Signed)
LFTs normal 09/13/21 and Hgba1c improved.  Repeat Hgba1c in 3 months my chart message sent to patient to schedule with RN Rolly Salter.  Also send [facility] dietitian scheduling information as 6 visits free/no charge.

## 2021-09-15 NOTE — Telephone Encounter (Signed)
Noted labs scheduled follow up 12/05/21 and [facility] dietitian information resent to patient by RN Rolly Salter

## 2021-09-15 NOTE — Progress Notes (Signed)
LVM for pt to review results. Appt sent by email for repeat A1c 12/05/21 at 1045.

## 2021-09-15 NOTE — Progress Notes (Signed)
Noted labs scheduled and attempted to reach patient.

## 2021-09-15 NOTE — Telephone Encounter (Signed)
Nonfasting lab appt scheduled for 12/05/21. Dietician info sent by email to pt.

## 2021-09-19 ENCOUNTER — Other Ambulatory Visit: Payer: Self-pay | Admitting: Registered Nurse

## 2021-09-19 ENCOUNTER — Encounter: Payer: Self-pay | Admitting: Registered Nurse

## 2021-09-19 DIAGNOSIS — G8929 Other chronic pain: Secondary | ICD-10-CM

## 2021-09-19 DIAGNOSIS — M25561 Pain in right knee: Secondary | ICD-10-CM

## 2021-09-19 NOTE — Telephone Encounter (Signed)
Labs completed 09/13/21 LFTs normal and Hgba1c improved.  See results note.  Patient contacted via telephone and she would like refill mobic 7.68m po daily #90 RF0 sent to her pharmacy of choice electronic Rx today.  Next Hgba1c in Jan 2023 and patient to continue weight loss efforts.  Has appt with dietitian scheduled Nov 2022 at [facility]  Patient notified Rx sent to her pharmacy today.  Has no further questions or concerns at this time and verbalized understanding information/instructions and agreed with plan of care.  Results for WROZENA, FIERRO(MRN [id number] as of 09/19/2021 19:07  Ref. Range 06/23/2021 10:00 09/13/2021 12:05  Sodium Latest Ref Range: 134 - 144 mmol/L 138   Potassium Latest Ref Range: 3.5 - 5.2 mmol/L 4.5   Chloride Latest Ref Range: 96 - 106 mmol/L 103   Glucose Latest Ref Range: 65 - 99 mg/dL 125 (H)   BUN Latest Ref Range: 6 - 24 mg/dL 13   Creatinine Latest Ref Range: 0.57 - 1.00 mg/dL 0.76   Calcium Latest Ref Range: 8.7 - 10.2 mg/dL 9.3   BUN/Creatinine Ratio Latest Ref Range: 9 - 23  17   eGFR Latest Ref Range: >59 mL/min/1.73 92   Phosphorus Latest Ref Range: 3.0 - 4.3 mg/dL 2.8 (L)   Magnesium Latest Ref Range: 1.6 - 2.3 mg/dL 2.2   Alkaline Phosphatase Latest Ref Range: 44 - 121 IU/L 72 63  Albumin Latest Ref Range: 3.8 - 4.9 g/dL 4.4 4.4  Albumin/Globulin Ratio Latest Ref Range: 1.2 - 2.2  1.8   Uric Acid Latest Ref Range: 3.0 - 7.2 mg/dL 4.5   AST Latest Ref Range: 0 - 40 IU/L 24 21  ALT Latest Ref Range: 0 - 32 IU/L 19 17  Total Protein Latest Ref Range: 6.0 - 8.5 g/dL 6.9 6.8  Total Bilirubin Latest Ref Range: 0.0 - 1.2 mg/dL 0.3 0.3  GGT Latest Ref Range: 0 - 60 IU/L 35   BILIRUBIN, DIRECT Latest Ref Range: 0.00 - 0.40 mg/dL  <0.10  Estimated CHD Risk Latest Ref Range: 0.0 - 1.0 times avg. 0.9   LDH Latest Ref Range: 119 - 226 IU/L 202   Total CHOL/HDL Ratio Latest Ref Range: 0.0 - 4.4 ratio 4.1   Cholesterol, Total Latest Ref Range: 100 - 199 mg/dL  175   HDL Cholesterol Latest Ref Range: >39 mg/dL 43   Triglycerides Latest Ref Range: 0 - 149 mg/dL 76   VLDL Cholesterol Cal Latest Ref Range: 5 - 40 mg/dL 14   LDL Chol Calc (NIH) Latest Ref Range: 0 - 99 mg/dL 118 (H)   Iron Latest Ref Range: 27 - 159 ug/dL 101   Globulin, Total Latest Ref Range: 1.5 - 4.5 g/dL 2.5   WBC Latest Ref Range: 3.4 - 10.8 x10E3/uL 5.3   RBC Latest Ref Range: 3.77 - 5.28 x10E6/uL 4.60   Hemoglobin Latest Ref Range: 11.1 - 15.9 g/dL 13.4   HCT Latest Ref Range: 34.0 - 46.6 % 40.7   MCV Latest Ref Range: 79 - 97 fL 89   MCH Latest Ref Range: 26.6 - 33.0 pg 29.1   MCHC Latest Ref Range: 31.5 - 35.7 g/dL 32.9   RDW Latest Ref Range: 11.7 - 15.4 % 12.6   Platelets Latest Ref Range: 150 - 450 x10E3/uL 286   Neutrophils Latest Ref Range: Not Estab. % 53   Immature Granulocytes Latest Ref Range: Not Estab. % 0   NEUT# Latest Ref Range: 1.4 - 7.0 x10E3/uL 2.8  Lymphocyte # Latest Ref Range: 0.7 - 3.1 x10E3/uL 1.9   Monocytes Absolute Latest Ref Range: 0.1 - 0.9 x10E3/uL 0.5   Basophils Absolute Latest Ref Range: 0.0 - 0.2 x10E3/uL 0.0   Immature Grans (Abs) Latest Ref Range: 0.0 - 0.1 x10E3/uL 0.0   Lymphs Latest Ref Range: Not Estab. % 35   Monocytes Latest Ref Range: Not Estab. % 9   Basos Latest Ref Range: Not Estab. % 0   Eos Latest Ref Range: Not Estab. % 3   EOS (ABSOLUTE) Latest Ref Range: 0.0 - 0.4 x10E3/uL 0.1   Hemoglobin A1C Latest Ref Range: 4.8 - 5.6 % 6.6 (H) 6.2 (H)  Est. average glucose Bld gHb Est-mCnc Latest Units: mg/dL  131

## 2021-09-30 NOTE — Telephone Encounter (Signed)
Noted patient was no show for GI follow up 09/07/21

## 2021-10-10 ENCOUNTER — Encounter: Payer: No Typology Code available for payment source | Admitting: Skilled Nursing Facility1

## 2021-10-14 ENCOUNTER — Other Ambulatory Visit: Payer: PRIVATE HEALTH INSURANCE

## 2021-11-08 DIAGNOSIS — M25562 Pain in left knee: Secondary | ICD-10-CM | POA: Insufficient documentation

## 2021-11-30 ENCOUNTER — Ambulatory Visit: Payer: No Typology Code available for payment source | Admitting: Skilled Nursing Facility1

## 2021-12-12 ENCOUNTER — Other Ambulatory Visit: Payer: Self-pay | Admitting: *Deleted

## 2021-12-12 ENCOUNTER — Encounter: Payer: Self-pay | Admitting: *Deleted

## 2021-12-12 DIAGNOSIS — J4521 Mild intermittent asthma with (acute) exacerbation: Secondary | ICD-10-CM

## 2021-12-12 DIAGNOSIS — N3941 Urge incontinence: Secondary | ICD-10-CM | POA: Insufficient documentation

## 2021-12-12 DIAGNOSIS — M5136 Other intervertebral disc degeneration, lumbar region: Secondary | ICD-10-CM | POA: Insufficient documentation

## 2021-12-12 DIAGNOSIS — E118 Type 2 diabetes mellitus with unspecified complications: Secondary | ICD-10-CM | POA: Insufficient documentation

## 2021-12-12 DIAGNOSIS — E119 Type 2 diabetes mellitus without complications: Secondary | ICD-10-CM | POA: Insufficient documentation

## 2021-12-12 DIAGNOSIS — M51369 Other intervertebral disc degeneration, lumbar region without mention of lumbar back pain or lower extremity pain: Secondary | ICD-10-CM | POA: Insufficient documentation

## 2021-12-12 DIAGNOSIS — E785 Hyperlipidemia, unspecified: Secondary | ICD-10-CM | POA: Insufficient documentation

## 2021-12-12 MED ORDER — ALBUTEROL SULFATE HFA 108 (90 BASE) MCG/ACT IN AERS: 1.0000 | INHALATION_SPRAY | RESPIRATORY_TRACT | 1 refills | Status: DC | PRN

## 2021-12-12 NOTE — Telephone Encounter (Signed)
Pt requesting albuterol inh refill. Reports asthma flare up. Increased ShOB over past few days with intermittent tightness in chest, like not being able to take a deep enough breath. Similar sx Dec 2021 and inh prescribed. Using albuterol neb at home as well. Does not feel she is at point of needing steroids or abx. Able to speak in complete sentences with no distress or wheeze noted. Will contact clinic if sx worsen or fail to improve. Denies further needs or concerns.

## 2021-12-12 NOTE — Telephone Encounter (Signed)
Case discussed with RN Hildred Alamin electronic Rx signed for albuterol inhaler and sent to patient pharmacy of choice.  Asthma flare most likely due to cold front/thunderstorms/HVAC running/dry air.

## 2021-12-19 ENCOUNTER — Other Ambulatory Visit: Payer: Self-pay | Admitting: Registered Nurse

## 2021-12-19 ENCOUNTER — Encounter: Payer: Self-pay | Admitting: Registered Nurse

## 2021-12-19 DIAGNOSIS — G8929 Other chronic pain: Secondary | ICD-10-CM

## 2021-12-19 NOTE — Addendum Note (Signed)
Addended by: Gerarda Fraction A on: 12/19/2021 11:11 AM   Modules accepted: Level of Service

## 2021-12-19 NOTE — Telephone Encounter (Signed)
Mesg left for pt asking if needing refill vs auto fill request. Awaiting response.

## 2021-12-19 NOTE — Telephone Encounter (Addendum)
Reviewed RN Hildred Alamin note patient did not request refill but does need refill.  Taking prn knee pain and has follow up with orthopedics.  Rx electronic mobic 7.5mg  po daily #90 RF0 sent to her pharmacy of choice.  Last LFTs Oct 2022 WNL and creatinine/BUN/GFR normal 06/23/21.

## 2021-12-19 NOTE — Telephone Encounter (Signed)
Pt reports she did not request this would like the fill completed. Taking it for knee pain. Reports meniscus tear and may have to have knee surgery this year. Following with ortho.

## 2021-12-20 ENCOUNTER — Other Ambulatory Visit: Payer: Self-pay | Admitting: Sports Medicine

## 2021-12-20 DIAGNOSIS — M1712 Unilateral primary osteoarthritis, left knee: Secondary | ICD-10-CM

## 2021-12-20 DIAGNOSIS — M25562 Pain in left knee: Secondary | ICD-10-CM

## 2021-12-23 ENCOUNTER — Other Ambulatory Visit: Payer: Self-pay

## 2021-12-23 ENCOUNTER — Ambulatory Visit
Admission: RE | Admit: 2021-12-23 | Discharge: 2021-12-23 | Disposition: A | Discharge status: Home / Self Care | Payer: No Typology Code available for payment source | Source: Ambulatory Visit | Attending: Sports Medicine | Admitting: Sports Medicine

## 2021-12-23 DIAGNOSIS — M25562 Pain in left knee: Secondary | ICD-10-CM

## 2021-12-23 DIAGNOSIS — M1712 Unilateral primary osteoarthritis, left knee: Secondary | ICD-10-CM

## 2021-12-30 ENCOUNTER — Other Ambulatory Visit: Payer: No Typology Code available for payment source

## 2022-01-11 ENCOUNTER — Ambulatory Visit: Payer: No Typology Code available for payment source | Admitting: Orthopedic Surgery

## 2022-01-11 ENCOUNTER — Encounter: Payer: Self-pay | Admitting: Orthopedic Surgery

## 2022-01-11 ENCOUNTER — Other Ambulatory Visit: Payer: Self-pay

## 2022-01-11 VITALS — Ht 61.0 in | Wt 212.0 lb

## 2022-01-11 DIAGNOSIS — M1712 Unilateral primary osteoarthritis, left knee: Secondary | ICD-10-CM

## 2022-01-11 MED ORDER — TRAMADOL HCL 50 MG PO TABS: 50.0000 mg | ORAL_TABLET | Freq: Two times a day (BID) | ORAL | 0 refills | Status: DC | PRN

## 2022-01-11 NOTE — Progress Notes (Signed)
Office Visit Note   Patient: Stephanie Chandler           Date of Birth: 17-Feb-1966           MRN: [id number] Visit Date: 01/11/2022 Requested by: No referring provider defined for this encounter. PCP: Roslynn Amble (Inactive)  Subjective: Chief Complaint  Patient presents with   Left Knee - Pain    HPI: Stephanie Chandler is a 56 y.o. female who presents to the office complaining of left knee pain.  Patient states that she has had left knee pain since mid November 2022.  She denies any history of injury to the left knee though she has had pain prior to November.  However, in mid November she awoke 1 day with severe left knee pain and could not really bear weight on her left knee.  She was seen at [facility] where she had an injection that provided good relief for several days before she returned back to not being able to bear weight on her leg.  She was seen by another provider at a different office where she was recommended to try topical anti-inflammatory which did not yield much relief.  She then saw her chiropractor who she normally sees for her back and her chiropractor recommended seeing Dr. Berline Chough.  She was seen by Dr. Berline Chough and had MRI ordered as well as having multiple injections with  aspiration of the posterior Baker's cyst and aspiration and injection of the knee joint itself.  Most recently 3 weeks ago.  She localizes most of her pain to the medial and anterior aspects of the left knee but does describe global pain at times including the posterior aspect of the knee..  Denies any history of knee surgery.  No groin pain.  She does have low back pain and has a history of low back pain for several years but has never had this worked up.  No history of lumbar spine MRI or surgery.  Denies any history of DVT or PE in her family.  She lives at home with her daughter and sister.  She has 2 steps to get into her home.  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  She is currently out on short-term disability and can remain out until June 11.              ROS: All systems reviewed are negative as they relate to the chief complaint within the history of present illness.  Patient denies fevers or chills.  Assessment & Plan: Visit Diagnoses:  1. Unilateral primary osteoarthritis, left knee     Plan: Patient is a 56 year old female who presents for new patient evaluation of left knee pain.  She has had pain onset since mid November without injury.  She has had multiple injections without relief.  She is here today to review MRI of the left knee and MRI demonstrated significant medial compartment arthritis with a radial tear at the posterior root of the medial meniscus.  Significant arthritis in the posterior femoral condyle with absence of articular cartilage is also visualized.  Sclerosis and bone bruising present in this region of the medial femoral condyle.  She has some spurring of the medial compartment as well.  Most but not all of her pain is localized to the medial compartment and seems to correlate with her MRI findings.  With fairly significant osteoarthritis already present, do not anticipate that a  meniscal root repair would be predictably beneficial for her.  She states that she would like a surgical solution that is definitive and wants to reduce the chance that she has to return for another procedure in the next 1 to 2 years.  Knee replacement is the best option for predictable pain relief given her significant medial compartment OA.  There is also some cartilage wear on the undersurface of the patella.  Discussed the risks and benefits of the procedure including the risk of nerve/vessel damage, knee stiffness, knee instability, medical complication from surgery such as DVT/PE, periprosthetic joint infection, need for revision surgery.  Discussed the recovery timeframe and the need for intensive rehabilitation.  After lengthy discussion  of options and of knee replacement recovery, patient would like to proceed with knee replacement.  She will be posted for surgery.  Work note provided placing her on sedentary duty.  She will be posted for surgery and follow-up with the office after procedure.  All questions answered.  In general although she is young for knee replacement her current knee problem does not have a predictable arthroscopic solution for pain relief.  Due to the global nature of her pain at times as well as presence of some patellofemoral arthritis total knee replacement with press-fit prosthesis will likely give her most predictable pain relief.  The rather grueling nature of the rehabilitative process is discussed at length with [name] along with time out of work estimates.  Follow-Up Instructions: No follow-ups on file.   Orders:  No orders of the defined types were placed in this encounter.  Meds ordered this encounter  Medications   traMADol (ULTRAM) 50 MG tablet    Sig: Take 1 tablet (50 mg total) by mouth every 12 (twelve) hours as needed.    Dispense:  30 tablet    Refill:  0      Procedures: No procedures performed   Clinical Data: No additional findings.  Objective: Vital Signs: Ht 5\' 1"  (1.549 m)    Wt 212 lb (96.2 kg)    BMI 40.06 kg/m   Physical Exam:  Constitutional: Patient appears well-developed HEENT:  Head: Normocephalic Eyes:EOM are normal Neck: Normal range of motion Cardiovascular: Normal rate Pulmonary/chest: Effort normal Neurologic: Patient is alert Skin: Skin is warm Psychiatric: Patient has normal mood and affect  Ortho Exam: Ortho exam demonstrates left knee with about 7 degree flexion contracture.  Flexes to 110 degrees.  Small effusion noted.  Tenderness primarily over the medial joint line.  No tenderness over the lateral joint line.  Able to perform straight leg raise without extensor lag compared with her passive motion.  No pain with hip range of motion.  Negative  straight leg raise.  No calf tenderness.  Negative Homans' sign.  No skin changes noted.  Pedal pulses are palpable.  Specialty Comments:  No specialty comments available.  Imaging: No results found.   PMFS History: Patient Active Problem List   Diagnosis Date Noted   Hyperlipidemia, unspecified 12/12/2021   Type 2 diabetes mellitus without complication (HCC) 12/12/2021   Urge incontinence of urine 12/12/2021   Degeneration of lumbar intervertebral disc 12/12/2021   Halitosis 07/25/2021   Abdominal bloating 06/23/2021   Flatulence, eructation and gas pain 06/23/2021   Chronic pain 11/04/2020   Exacerbation of asthma 11/04/2020   Hyperglycemia due to type 2 diabetes mellitus (HCC) 11/04/2020   Mild intermittent asthma 11/04/2020   Intractable hiccups 11/10/2019   COVID-19 virus infection 11/10/2019   Morbid obesity (  HCC) 12/12/2018   Acute pain of right shoulder 03/15/2017   Strain of right trapezius muscle 03/15/2017   Musculoskeletal pain 03/15/2017   OSA (obstructive sleep apnea) 08/23/2015   Sciatica 11/13/2012   Past Medical History:  Diagnosis Date   Arthritis    Diabetes mellitus without complication (HCC)     Family History  Problem Relation Age of Onset   Diabetes Mother    Hypertension Mother    Arthritis Mother    Diabetes Father    Kidney disease Father    Heart disease Maternal Grandmother    Diabetes Paternal Grandmother     Past Surgical History:  Procedure Laterality Date   ABDOMINAL HYSTERECTOMY     HYSTERECTOMY ABDOMINAL WITH SALPINGECTOMY  1997   Social History   Occupational History   Not on file  Tobacco Use   Smoking status: Former    Packs/day: 0.12    Years: 0.50    Pack years: 0.06    Types: Cigarettes    Quit date: 2021    Years since quitting: 2.1   Smokeless tobacco: Never  Vaping Use   Vaping Use: Never used  Substance and Sexual Activity   Alcohol use: Yes    Alcohol/week: 1.0 standard drink    Types: 1 Glasses of wine  per week    Comment: 1 glass a year   Drug use: No   Sexual activity: Never

## 2022-01-12 ENCOUNTER — Encounter: Payer: Self-pay | Admitting: Orthopedic Surgery

## 2022-01-17 ENCOUNTER — Other Ambulatory Visit: Payer: Self-pay

## 2022-01-17 ENCOUNTER — Ambulatory Visit: Payer: Self-pay | Admitting: *Deleted

## 2022-01-17 VITALS — BP 141/91 | Wt 208.0 lb

## 2022-01-17 DIAGNOSIS — R7303 Prediabetes: Secondary | ICD-10-CM

## 2022-01-17 NOTE — Progress Notes (Signed)
Requested A1c and wt check for surgeon for upcoming knee surgery. Due for A1c per 09/22/21 labs with NP Otila Kluver.  Pt will RTC Thursday for fasting labs and repeat BP for [facility]. Reports just coming out of stressful meeting and BP may be up due to that.

## 2022-01-18 LAB — HGB A1C W/O EAG: Hgb A1c MFr Bld: 6.7 % — ABNORMAL HIGH (ref 4.8–5.6)

## 2022-01-18 NOTE — Addendum Note (Signed)
Addended by: Gerarda Fraction A on: 01/18/2022 07:34 AM   Modules accepted: Orders

## 2022-01-20 ENCOUNTER — Ambulatory Visit: Payer: Self-pay | Admitting: *Deleted

## 2022-01-20 VITALS — BP 146/88 | HR 82 | Ht 60.0 in | Wt 208.0 lb

## 2022-01-20 DIAGNOSIS — E119 Type 2 diabetes mellitus without complications: Secondary | ICD-10-CM

## 2022-01-20 DIAGNOSIS — Z0000 Encounter for general adult medical examination without abnormal findings: Secondary | ICD-10-CM

## 2022-01-20 DIAGNOSIS — E559 Vitamin D deficiency, unspecified: Secondary | ICD-10-CM

## 2022-01-20 DIAGNOSIS — M7918 Myalgia, other site: Secondary | ICD-10-CM

## 2022-01-20 NOTE — Progress Notes (Signed)
[facility] insurance premium discount evaluation: Labs Drawn. Replacements ROI form signed. Tobacco Free Attestation form signed.  Forms placed in paper chart.  

## 2022-01-21 ENCOUNTER — Encounter: Payer: Self-pay | Admitting: Registered Nurse

## 2022-01-21 LAB — CMP12+LP+TP+TSH+6AC+CBC/D/PLT
ALT: 20 IU/L (ref 0–32)
AST: 18 IU/L (ref 0–40)
Albumin/Globulin Ratio: 1.6 (ref 1.2–2.2)
Albumin: 4.1 g/dL (ref 3.8–4.9)
Alkaline Phosphatase: 75 IU/L (ref 44–121)
BUN/Creatinine Ratio: 14 (ref 9–23)
BUN: 11 mg/dL (ref 6–24)
Basophils Absolute: 0 10*3/uL (ref 0.0–0.2)
Basos: 0 %
Bilirubin Total: 0.3 mg/dL (ref 0.0–1.2)
Calcium: 9.6 mg/dL (ref 8.7–10.2)
Chloride: 105 mmol/L (ref 96–106)
Chol/HDL Ratio: 3.9 ratio (ref 0.0–4.4)
Cholesterol, Total: 170 mg/dL (ref 100–199)
Creatinine, Ser: 0.76 mg/dL (ref 0.57–1.00)
EOS (ABSOLUTE): 0.1 10*3/uL (ref 0.0–0.4)
Eos: 2 %
Estimated CHD Risk: 0.8 times avg. (ref 0.0–1.0)
Free Thyroxine Index: 2.5 (ref 1.2–4.9)
GGT: 33 IU/L (ref 0–60)
Globulin, Total: 2.5 g/dL (ref 1.5–4.5)
Glucose: 128 mg/dL — ABNORMAL HIGH (ref 70–99)
HDL: 44 mg/dL (ref >39)
Hematocrit: 37.5 % (ref 34.0–46.6)
Hemoglobin: 13.2 g/dL (ref 11.1–15.9)
Immature Grans (Abs): 0 10*3/uL (ref 0.0–0.1)
Immature Granulocytes: 0 %
Iron: 86 ug/dL (ref 27–159)
LDH: 200 IU/L (ref 119–226)
LDL Chol Calc (NIH): 115 mg/dL — ABNORMAL HIGH (ref 0–99)
Lymphocytes Absolute: 1.7 10*3/uL (ref 0.7–3.1)
Lymphs: 34 %
MCH: 30.8 pg (ref 26.6–33.0)
MCHC: 35.2 g/dL (ref 31.5–35.7)
MCV: 88 fL (ref 79–97)
Monocytes Absolute: 0.6 10*3/uL (ref 0.1–0.9)
Monocytes: 12 %
Neutrophils Absolute: 2.7 10*3/uL (ref 1.4–7.0)
Neutrophils: 52 %
Phosphorus: 2.7 mg/dL — ABNORMAL LOW (ref 3.0–4.3)
Platelets: 295 10*3/uL (ref 150–450)
Potassium: 4.1 mmol/L (ref 3.5–5.2)
RBC: 4.28 x10E6/uL (ref 3.77–5.28)
RDW: 13.3 % (ref 11.7–15.4)
Sodium: 140 mmol/L (ref 134–144)
T3 Uptake Ratio: 30 % (ref 24–39)
T4, Total: 8.2 ug/dL (ref 4.5–12.0)
TSH: 0.647 u[IU]/mL (ref 0.450–4.500)
Total Protein: 6.6 g/dL (ref 6.0–8.5)
Triglycerides: 53 mg/dL (ref 0–149)
Uric Acid: 4.2 mg/dL (ref 3.0–7.2)
VLDL Cholesterol Cal: 11 mg/dL (ref 5–40)
WBC: 5.1 10*3/uL (ref 3.4–10.8)
eGFR: 92 mL/min/{1.73_m2} (ref >59)

## 2022-01-22 LAB — MICROALBUMIN / CREATININE URINE RATIO
Creatinine, Urine: 14 mg/dL
Microalb/Creat Ratio: <21 mg/g creat (ref 0–29)
Microalbumin, Urine: <3 ug/mL

## 2022-01-23 NOTE — Progress Notes (Signed)
Noted Vitamin D added to [place] and results pending

## 2022-01-23 NOTE — Progress Notes (Signed)
Vitamin D added on with [facility].

## 2022-01-24 ENCOUNTER — Encounter: Payer: Self-pay | Admitting: Registered Nurse

## 2022-01-24 ENCOUNTER — Other Ambulatory Visit: Payer: Self-pay | Admitting: *Deleted

## 2022-01-24 DIAGNOSIS — Z79899 Other long term (current) drug therapy: Secondary | ICD-10-CM

## 2022-01-24 DIAGNOSIS — Z1159 Encounter for screening for other viral diseases: Secondary | ICD-10-CM

## 2022-01-24 LAB — VITAMIN D 25 HYDROXY (VIT D DEFICIENCY, FRACTURES): Vit D, 25-Hydroxy: 17.6 ng/mL — ABNORMAL LOW (ref 30.0–100.0)

## 2022-01-24 LAB — SPECIMEN STATUS REPORT

## 2022-01-24 MED ORDER — CHOLECALCIFEROL 1.25 MG (50000 UT) PO TABS: 1.0000 | ORAL_TABLET | ORAL | 0 refills | Status: AC

## 2022-01-24 NOTE — Addendum Note (Signed)
Addended by: Gerarda Fraction A on: 01/24/2022 07:05 AM   Modules accepted: Orders

## 2022-01-24 NOTE — Progress Notes (Signed)
Reviewed RN Haley note agreed with plan of care. 

## 2022-01-24 NOTE — Progress Notes (Signed)
All results discussed with pt in clinic. Diet and exercise recommendations discussed re: A1c, LDL. Confirmed pharmacy of choice for Vit D Rx. She is fine with where it was sent and will pick up today or tomorrow. Routine f/u with pcp. Pt brought additional lab orders from appt she had with new pcp this morning. Two more labs drawn. Copy of labs provided to pt. Results routed to pcp per pt request. No further questions/concerns.

## 2022-01-24 NOTE — Progress Notes (Signed)
Pt saw new pcp today for CPE. Lab orders received. Most already performed and previously faxed to pcp office through [facility] labs last week. Outstanding orders of vitamin B12 and Hep C Antibodies. Pt wants to draw in clinic today. Will resend previous results with new results once available.

## 2022-01-25 ENCOUNTER — Telehealth: Payer: Self-pay | Admitting: Orthopedic Surgery

## 2022-01-25 LAB — HCV INTERPRETATION

## 2022-01-25 LAB — HCV AB W REFLEX TO QUANT PCR: HCV Ab: NONREACTIVE

## 2022-01-25 LAB — VITAMIN B12: Vitamin B-12: 399 pg/mL (ref 232–1245)

## 2022-01-25 NOTE — Telephone Encounter (Signed)
Patient called. She would like to know if it is ok to work her full work shift. Would like a call back. [phone] ?

## 2022-01-26 NOTE — Telephone Encounter (Signed)
Yes it is really just a matter of how much pain she can tolerate.  Standing for a long time and that knee is going to be painful but structurally she is not really getting do that much more damage prior to her knee replacement.

## 2022-01-26 NOTE — Progress Notes (Signed)
Results routed and previous results resent to pcp as pt reported they did not have it at her appt earlier this week.

## 2022-01-26 NOTE — Progress Notes (Signed)
Noted results sent to PCP by RN Hildred Alamin

## 2022-01-27 NOTE — Telephone Encounter (Signed)
Note written and faxed to [phone] per pt request ?

## 2022-01-27 NOTE — Telephone Encounter (Signed)
Patient called into the office and just needs a note stating that she can work light duty but still her 4 hours.  ?

## 2022-01-27 NOTE — Telephone Encounter (Signed)
Tried calling-no answer. LMVM advising per Dr August Saucer ?

## 2022-02-07 ENCOUNTER — Encounter: Payer: Self-pay | Admitting: Registered Nurse

## 2022-02-07 ENCOUNTER — Telehealth: Payer: Self-pay | Admitting: Registered Nurse

## 2022-02-07 DIAGNOSIS — E559 Vitamin D deficiency, unspecified: Secondary | ICD-10-CM | POA: Insufficient documentation

## 2022-02-07 DIAGNOSIS — R12 Heartburn: Secondary | ICD-10-CM

## 2022-02-07 DIAGNOSIS — F4321 Adjustment disorder with depressed mood: Secondary | ICD-10-CM | POA: Insufficient documentation

## 2022-02-07 NOTE — Telephone Encounter (Signed)
Per paper chart review [employer] clinic patient last filled omeprazole 20mg  DR po daily #90 Rf0 06/23/2021.  Patient recently started ozempic and heartburn worsening.  Has new Rx on file from Dr 06/25/2021.  90 day supply filled for patient today from [facility] at [facility] onsite formulary. ?

## 2022-02-28 ENCOUNTER — Telehealth: Payer: Self-pay | Admitting: Orthopedic Surgery

## 2022-02-28 NOTE — Telephone Encounter (Signed)
Pt called requesting a letter to be written out of work due to her pains. Pt has an upcoming appt. Please call pt about this matter at [phone]. ?

## 2022-03-01 ENCOUNTER — Telehealth: Payer: Self-pay | Admitting: Orthopedic Surgery

## 2022-03-01 NOTE — Telephone Encounter (Signed)
Patient is scheduled for left total knee 03-14-22 with Dr. August Saucer.  She was calling to find out if surgery can be moved to a sooner date, however patient has not yet had her pre-op visit and her daughter is not able to help her post operatively until her scheduled surgery date. Patient stated she had quite a bit of swelling in her legs since the beginning of this week, but the swelling has gone down since she decided not to go to work and stand all day. She is requesting a note to be taken out of work (starting date of Monday 02-27-22) and to provide the employer with date of surgery and projected length of time out of work.  Please send to patient's supervisor  [email or web address] ?Patient also requesting to be notified once note has been sent to employer.  [phone] ? ?

## 2022-03-01 NOTE — Telephone Encounter (Signed)
Ok by me

## 2022-03-01 NOTE — Telephone Encounter (Signed)
Pending response from Dean/Luke ?

## 2022-03-01 NOTE — Telephone Encounter (Signed)
Pt called yesterday and seeing about being written out of work. Pt states her leg is swollen and she is in severe pain. Pt has upcoming surgery in April. Please call pt about this matter at [phone]. ?

## 2022-03-02 NOTE — Telephone Encounter (Signed)
IC patient and LMVM advising note completed and could access through mychart. Asked patient to please provide fax number for [place] to fax note.  ?

## 2022-03-02 NOTE — Telephone Encounter (Signed)
Patient would also like to know if we can send it to her Via email also  ?

## 2022-03-02 NOTE — Telephone Encounter (Signed)
Fax # [phone] ?

## 2022-03-02 NOTE — Telephone Encounter (Signed)
Note faxed and emailed ?

## 2022-03-02 NOTE — Pre-Procedure Instructions (Signed)
Surgical Instructions ? ? ? Your procedure is scheduled on Tuesday, March 14, 2022 at 7:30 AM. ? Report to [facility] Main Entrance "A" at 5:30 A.M., then check in with the Admitting office. ? Call this number if you have problems the morning of surgery: ? [phone] ? ? If you have any questions prior to your surgery date call [phone]: Open Monday-Friday 8am-4pm ? ? ? Remember: ? Do not eat after midnight the night before your surgery ? ?You may drink clear liquids until 4:30 AM the morning of your surgery.   ?Clear liquids allowed are: Water, Non-Citrus Juices (without pulp), Carbonated Beverages, Clear Tea, Black Coffee Only (NO MILK, CREAM OR POWDERED CREAMER of any kind), and Gatorade. ? ? ?Enhanced Recovery after Surgery for Orthopedics ?Enhanced Recovery after Surgery is a protocol used to improve the stress on your body and your recovery after surgery. ? ?Patient Instructions ? ?The day of surgery (if you have diabetes): ? ?Drink ONE small bottle of G2 by 4:30 am the morning of surgery ?This bottle was given to you during your hospital  ?pre-op appointment visit.  ?Nothing else to drink after completing the  ?G2. ? ?       If you have questions, please contact your surgeon?s office. ? ?  ? Take these medicines the morning of surgery with A SIP OF WATER: ? ?fluticasone (FLONASE) ?loratadine (CLARITIN) ?omeprazole (PRILOSEC)  ? ?IF NEEDED: ?albuterol (VENTOLIN HFA) - Bring with you day of surgery. ?traMADol Janean Sark) ? ?As of today, STOP taking any Aspirin (unless otherwise instructed by your surgeon) Aleve, Naproxen, Ibuprofen, Motrin, Advil, Goody's, BC's, all herbal medications, fish oil, and all vitamins. This includes you diclofenac Sodium (VOLTAREN). ? ?WHAT DO I DO ABOUT MY DIABETES MEDICATION? ? ? ?The day of surgery, do not take diabetes injectables, including your Semaglutide, (OZEMPIC). ? ? ?HOW TO MANAGE YOUR DIABETES ?BEFORE AND AFTER SURGERY ? ?Why is it important to control my blood sugar  before and after surgery? ?Improving blood sugar levels before and after surgery helps healing and can limit problems. ?A way of improving blood sugar control is eating a healthy diet by: ? Eating less sugar and carbohydrates ? Increasing activity/exercise ? Talking with your doctor about reaching your blood sugar goals ?High blood sugars (greater than 180 mg/dL) can raise your risk of infections and slow your recovery, so you will need to focus on controlling your diabetes during the weeks before surgery. ?Make sure that the doctor who takes care of your diabetes knows about your planned surgery including the date and location. ? ?How do I manage my blood sugar before surgery? ?Check your blood sugar at least 4 times a day, starting 2 days before surgery, to make sure that the level is not too high or low. ? ?Check your blood sugar the morning of your surgery when you wake up and every 2 hours until you get to the Short Stay unit. ? ?If your blood sugar is less than 70 mg/dL, you will need to treat for low blood sugar: ?Do not take insulin. ?Treat a low blood sugar (less than 70 mg/dL) with ? cup of clear juice (cranberry or apple), 4 glucose tablets, OR glucose gel. ?Recheck blood sugar in 15 minutes after treatment (to make sure it is greater than 70 mg/dL). If your blood sugar is not greater than 70 mg/dL on recheck, call [phone] for further instructions. ?Report your blood sugar to the short stay nurse when you get to Short  Stay. ? ?If you are admitted to the hospital after surgery: ?Your blood sugar will be checked by the staff and you will probably be given insulin after surgery (instead of oral diabetes medicines) to make sure you have good blood sugar levels. ?The goal for blood sugar control after surgery is 80-180 mg/dL. ? ?         ?           ?Do NOT Smoke (Tobacco/Vaping) for 24 hours prior to your procedure. ? ?If you use a CPAP at night, you may bring your mask/headgear for your overnight  stay. ?  ?Contacts, glasses, piercing's, hearing aid's, dentures or partials may not be worn into surgery, please bring cases for these belongings.  ?  ?For patients admitted to the hospital, discharge time will be determined by your treatment team. ?  ?Patients discharged the day of surgery will not be allowed to drive home, and someone needs to stay with them for 24 hours. ? ?SURGICAL WAITING ROOM VISITATION ?Patients having surgery or a procedure may have two support people in the waiting area. ?Visitors may stay in the waiting area during the procedure and switch out with other visitors if needed. ?Children under the age of 73 must have an adult accompany them who is not the patient. ?If the patient needs to stay at the hospital during part of their recovery, the visitor guidelines for inpatient rooms apply. ? ?Please refer to the [facility] website for the visitor guidelines for Inpatients (after your surgery is over and you are in a regular room).  ? ? ?Special instructions:   ?[place]- Preparing For Surgery ? ?Before surgery, you can play an important role. Because skin is not sterile, your skin needs to be as free of germs as possible. You can reduce the number of germs on your skin by washing with CHG (chlorahexidine gluconate) Soap before surgery.  CHG is an antiseptic cleaner which kills germs and bonds with the skin to continue killing germs even after washing.   ? ?Oral Hygiene is also important to reduce your risk of infection.  Remember - BRUSH YOUR TEETH THE MORNING OF SURGERY WITH YOUR REGULAR TOOTHPASTE ? ?Please do not use if you have an allergy to CHG or antibacterial soaps. If your skin becomes reddened/irritated stop using the CHG.  ?Do not shave (including legs and underarms) for at least 48 hours prior to first CHG shower. It is OK to shave your face. ? ?Please follow these instructions carefully. ?  ?Shower the NIGHT BEFORE SURGERY and the MORNING OF SURGERY ? ?If you chose to wash your  hair, wash your hair first as usual with your normal shampoo. ? ?After you shampoo, rinse your hair and body thoroughly to remove the shampoo. ? ?Use CHG Soap as you would any other liquid soap. You can apply CHG directly to the skin and wash gently with a scrungie or a clean washcloth.  ? ?Apply the CHG Soap to your body ONLY FROM THE NECK DOWN.  Do not use on open wounds or open sores. Avoid contact with your eyes, ears, mouth and genitals (private parts). Wash Face and genitals (private parts)  with your normal soap.  ? ?Wash thoroughly, paying special attention to the area where your surgery will be performed. ? ?Thoroughly rinse your body with warm water from the neck down. ? ?DO NOT shower/wash with your normal soap after using and rinsing off the CHG Soap. ? ?Pat yourself dry with a  CLEAN TOWEL. ? ?Wear CLEAN PAJAMAS to bed the night before surgery ? ?Place CLEAN SHEETS on your bed the night before your surgery ? ?DO NOT SLEEP WITH PETS. ? ? ?Day of Surgery: ?Take a shower with CHG soap. ?Do not wear jewelry or makeup ?Do not wear lotions, powders, perfumes, or deodorant. ?Do not shave 48 hours prior to surgery. ?Do not bring valuables to the hospital.  ?[facility] is not responsible for any belongings or valuables. ?Do not wear nail polish, gel polish, artificial nails, or any other type of covering on natural nails (fingers and toes) ?If you have artificial nails or gel coating that need to be removed by a nail salon, please have this removed prior to surgery. Artificial nails or gel coating may interfere with anesthesia's ability to adequately monitor your vital signs. ?Wear Clean/Comfortable clothing the morning of surgery ?Do not apply any deodorants/lotions.   ?Remember to brush your teeth WITH YOUR REGULAR TOOTHPASTE. ?  ?Please read over the following fact sheets that you were given. ? ?If you received a COVID test during your pre-op visit  it is requested that you wear a mask when out in public,  stay away from anyone that may not be feeling well and notify your surgeon if you develop symptoms. If you have been in contact with anyone that has tested positive in the last 10 days please notify you surgeo

## 2022-03-03 ENCOUNTER — Encounter (HOSPITAL_COMMUNITY)
Admission: RE | Admit: 2022-03-03 | Discharge: 2022-03-03 | Disposition: A | Discharge status: Home / Self Care | Payer: No Typology Code available for payment source | Source: Ambulatory Visit | Attending: Orthopedic Surgery | Admitting: Orthopedic Surgery

## 2022-03-03 ENCOUNTER — Other Ambulatory Visit (HOSPITAL_COMMUNITY): Payer: Self-pay

## 2022-03-03 ENCOUNTER — Other Ambulatory Visit: Payer: Self-pay

## 2022-03-03 ENCOUNTER — Encounter (HOSPITAL_COMMUNITY): Payer: Self-pay

## 2022-03-03 VITALS — BP 125/77 | HR 90 | Temp 98.5°F | Resp 17 | Ht 61.0 in | Wt 207.2 lb

## 2022-03-03 DIAGNOSIS — Z90710 Acquired absence of both cervix and uterus: Secondary | ICD-10-CM | POA: Diagnosis not present

## 2022-03-03 DIAGNOSIS — Z87891 Personal history of nicotine dependence: Secondary | ICD-10-CM | POA: Diagnosis not present

## 2022-03-03 DIAGNOSIS — J45909 Unspecified asthma, uncomplicated: Secondary | ICD-10-CM | POA: Insufficient documentation

## 2022-03-03 DIAGNOSIS — M1712 Unilateral primary osteoarthritis, left knee: Secondary | ICD-10-CM | POA: Diagnosis not present

## 2022-03-03 DIAGNOSIS — Z01818 Encounter for other preprocedural examination: Secondary | ICD-10-CM | POA: Insufficient documentation

## 2022-03-03 DIAGNOSIS — G4733 Obstructive sleep apnea (adult) (pediatric): Secondary | ICD-10-CM | POA: Insufficient documentation

## 2022-03-03 DIAGNOSIS — E119 Type 2 diabetes mellitus without complications: Secondary | ICD-10-CM | POA: Diagnosis not present

## 2022-03-03 HISTORY — DX: Sleep apnea, unspecified: G47.30

## 2022-03-03 HISTORY — DX: Unspecified asthma, uncomplicated: J45.909

## 2022-03-03 LAB — SURGICAL PCR SCREEN
MRSA, PCR: NEGATIVE
Staphylococcus aureus: NEGATIVE

## 2022-03-03 LAB — URINALYSIS, ROUTINE W REFLEX MICROSCOPIC
Bilirubin Urine: NEGATIVE
Glucose, UA: NEGATIVE mg/dL
Hgb urine dipstick: NEGATIVE
Ketones, ur: NEGATIVE mg/dL
Nitrite: NEGATIVE
Protein, ur: NEGATIVE mg/dL
Specific Gravity, Urine: 1.021 (ref 1.005–1.030)
pH: 6 (ref 5.0–8.0)

## 2022-03-03 LAB — CBC
HCT: 41 % (ref 36.0–46.0)
Hemoglobin: 13.8 g/dL (ref 12.0–15.0)
MCH: 30.6 pg (ref 26.0–34.0)
MCHC: 33.7 g/dL (ref 30.0–36.0)
MCV: 90.9 fL (ref 80.0–100.0)
Platelets: 293 10*3/uL (ref 150–400)
RBC: 4.51 MIL/uL (ref 3.87–5.11)
RDW: 12.5 % (ref 11.5–15.5)
WBC: 6.8 10*3/uL (ref 4.0–10.5)
nRBC: 0 % (ref 0.0–0.2)

## 2022-03-03 LAB — BASIC METABOLIC PANEL
Anion gap: 7 (ref 5–15)
BUN: 9 mg/dL (ref 6–20)
CO2: 26 mmol/L (ref 22–32)
Calcium: 9.5 mg/dL (ref 8.9–10.3)
Chloride: 109 mmol/L (ref 98–111)
Creatinine, Ser: 0.96 mg/dL (ref 0.44–1.00)
GFR, Estimated: >60 mL/min (ref >60)
Glucose, Bld: 112 mg/dL — ABNORMAL HIGH (ref 70–99)
Potassium: 3.7 mmol/L (ref 3.5–5.1)
Sodium: 142 mmol/L (ref 135–145)

## 2022-03-03 LAB — GLUCOSE, CAPILLARY: Glucose-Capillary: 98 mg/dL (ref 70–99)

## 2022-03-03 NOTE — Progress Notes (Signed)
PCP - Dr. Aliene Beams ?Cardiologist - denies ? ?PPM/ICD - denies ? ? ?Chest x-ray - 11/10/19 ?EKG - 03/03/22 ?Stress Test - denies ?ECHO - denies ?Cardiac Cath - denies ? ?Sleep Study - sleep study around 2016 per pt, OSA+ ?CPAP - pt states she was told she didn't have to wear it so she doesn't ? ?DM- Type 2 ?Fasting Blood Sugar - 120-130 ?Checks Blood Sugar 2-3 times a week ? ?ASA/Blood Thinner Instructions: n/a ? ? ?ERAS Protcol - yes ?PRE-SURGERY G2- given at PAT ? ?COVID TEST- n/a ? ? ?Anesthesia review: no ? ?Patient denies shortness of breath, fever, cough and chest pain at PAT appointment ? ? ?All instructions explained to the patient, with a verbal understanding of the material. Patient agrees to go over the instructions while at home for a better understanding. Patient also instructed to notify surgeon of any contact with COVID+ person or if she develops any symptoms. The opportunity to ask questions was provided. ?  ?

## 2022-03-04 LAB — URINE CULTURE: Culture: <10000 — AB

## 2022-03-06 ENCOUNTER — Telehealth: Payer: Self-pay | Admitting: Orthopedic Surgery

## 2022-03-06 NOTE — Telephone Encounter (Signed)
Pt has questions regarding surgery --she would like to speak with a nurse .. please call  ?

## 2022-03-06 NOTE — Progress Notes (Signed)
Anesthesia Chart Review: ? Case: [id number] Date/Time: 03/14/22 0715  ? Procedure: LEFT TOTAL KNEE ARTHROPLASTY (Left: Knee)  ? Anesthesia type: Spinal  ? Pre-op diagnosis: left knee osteoarthritis  ? Location: [facility] ROOM 06 / [facility] OR  ? Surgeons: Cammy Copa, MD  ? ?  ? ? ?DISCUSSION: Patient is a 56 year old female scheduled for the above procedure. ? ?History includes former smoker (quit 11/28/19), DM2, asthma, OSA (mild OSA 06/2018; does not use CPAP), hysterectomy. BMI is consistent with obesity. ? ?EKG showed sinus rhythm, stable possible anterior infarct changes.  She denied chest pain and shortness of breath per PAT RN interview. Insignificant growth on urine culture.  Anesthesia team to evaluate on the day of surgery. ? ? ?VS: BP 125/77   Pulse 90   Temp 36.9 ?C (Oral)   Resp 17   Ht 5\' 1"  (1.549 m)   Wt 94 kg   SpO2 99%   BMI 39.15 kg/m?  ? ?PROVIDERS: ? , MD is PCP  ? ? ?LABS: Labs reviewed: Acceptable for surgery. A1c 01/17/22 was 6.7%.  ?(all labs ordered are listed, but only abnormal results are displayed) ? ?Labs Reviewed  ?URINE CULTURE - Abnormal; Notable for the following components:  ?    Result Value  ? Culture   (*)   ? Value: <10,000 COLONIES/mL INSIGNIFICANT GROWTH ?Performed at [facility], [address] [place], [place] ?  ? All other components within normal limits  ?BASIC METABOLIC PANEL - Abnormal; Notable for the following components:  ? Glucose, Bld 112 (*)   ? All other components within normal limits  ?URINALYSIS, ROUTINE W REFLEX MICROSCOPIC - Abnormal; Notable for the following components:  ? APPearance HAZY (*)   ? Leukocytes,Ua MODERATE (*)   ? Bacteria, UA RARE (*)   ? Non Squamous Epithelial 6-10 (*)   ? All other components within normal limits  ?SURGICAL PCR SCREEN  ?GLUCOSE, CAPILLARY  ?CBC  ? ? ?Split Night Sleep Study 07/05/18: ?IMPRESSION:  ?1. Obstructive Sleep Apnea(OSA)  ?2. Dysfunctions associated with sleep stages or arousal from   ?sleep  ? ?RECOMMENDATIONS:  ?1. This study demonstrates overall mild obstructive sleep apnea,  ?severe in REM and duiring supine sleep with a total AHI of  ?10.7/hour, REM AHI of 53.8/hour, supine AHI of 30/hour and O2  ?nadir of 81%. Given the patient's medical history and sleep  ?related complaints, treatment with positive airway pressure is a  ?good choice; a full-night CPAP titration study is recommended to  ?optimize therapy. Other treatment options may include avoidance  ?of supine sleep position along with weight loss, upper airway or  ?jaw surgery in selected patients or the use of an oral appliance  ?in certain patients. ENT evaluation and/or consultation with a  ?maxillofacial surgeon or dentist may be feasible in some  ?instances... ?    ?CPAP Titration Study 08/09/18: ?RECOMMENDATIONS:  ?1. This study demonstrates resolution of the patient's  ?obstructive sleep apnea with CPAP therapy. I will, therefore,  ?start the patient on home CPAP treatment at a pressure of 10 cm  ?via small Eson 2 nasal mask with heated humidity... ? ? ?IMAGES: ?MRI Right knee 12/26/21: ?IMPRESSION: ?- Tricompartment osteoarthritis, worst in medial compartment, ?cartilaginous abnormalities as described above. ?- Suspected small radial tear at the posterior root of the medial ?meniscus with minimal extrusion. ?- Mucoid degeneration of the ACL. ?- Small joint effusion.  Moderate size Baker cyst. ?  ?  ? ?EKG: 03/03/22: ?  Sinus rhythm ?Possible Anterior infarct , age undetermined ?Abnormal ECG ?When compared with ECG of 09-Jun-2018 02:43, ?No significant change since last tracing ?Confirmed by Riley Lam [phone]) on 03/03/2022 1:26:11 PM ? ? ?CV: N/A ? ?Past Medical History:  ?Diagnosis Date  ? Arthritis   ? Asthma   ? Diabetes mellitus without complication (HCC)   ? type 2  ? Sleep apnea   ? pt states "one doctor told me to wear CPAP, another told me I didn't need to, so I do not wear a CPAP"  ? ? ?Past Surgical History:   ?Procedure Laterality Date  ? ABDOMINAL HYSTERECTOMY    ? CATARACT EXTRACTION Bilateral 2018  ? HYSTERECTOMY ABDOMINAL WITH SALPINGECTOMY  1997  ? ? ?MEDICATIONS: ? albuterol (VENTOLIN HFA) 108 (90 Base) MCG/ACT inhaler  ? Cholecalciferol 1.25 MG (50000 UT) TABS  ? diclofenac Sodium (VOLTAREN) 1 % GEL  ? fluticasone (FLONASE) 50 MCG/ACT nasal spray  ? loratadine (CLARITIN) 10 MG tablet  ? omeprazole (PRILOSEC) 20 MG capsule  ? Semaglutide,0.25 or 0.5MG /DOS, (OZEMPIC, 0.25 OR 0.5 MG/DOSE,) 2 MG/3ML SOPN  ? traMADol (ULTRAM) 50 MG tablet  ? TURMERIC PO  ? ?No current facility-administered medications for this encounter.  ? ? ? ?Shonna Chock, PA-C ?Surgical Short Stay/Anesthesiology ?[facility] Phone [phone] ?[facility] Phone [phone] ?03/06/2022 1:32 PM ? ? ? ? ? ?

## 2022-03-06 NOTE — Anesthesia Preprocedure Evaluation (Addendum)
Anesthesia Evaluation  ?Patient identified by MRN, date of birth, ID band ?Patient awake ? ? ? ?Reviewed: ?Allergy & Precautions, NPO status , Patient's Chart, lab work & pertinent test results ? ?Airway ?Mallampati: III ? ?TM Distance: >3 FB ?Neck ROM: Full ? ? ? Dental ?no notable dental hx. ?(+) Teeth Intact, Dental Advisory Given ?  ?Pulmonary ?asthma , sleep apnea (no CPAP) , former smoker,  ?  ?Pulmonary exam normal ?breath sounds clear to auscultation ? ? ? ? ? ? Cardiovascular ?negative cardio ROS ?Normal cardiovascular exam ?Rhythm:Regular Rate:Normal ? ? ?  ?Neuro/Psych ?negative neurological ROS ? negative psych ROS  ? GI/Hepatic ?negative GI ROS, Neg liver ROS,   ?Endo/Other  ?diabetes, Type 2Obese BMI 39 ? Renal/GU ?negative Renal ROS  ?negative genitourinary ?  ?Musculoskeletal ? ?(+) Arthritis ,  ? Abdominal ?  ?Peds ? Hematology ?negative hematology ROS ?(+)   ?Anesthesia Other Findings ? ? Reproductive/Obstetrics ? ?  ? ? ? ? ? ? ? ? ? ? ? ? ? ?  ?  ? ? ? ? ? ? ?Anesthesia Physical ?Anesthesia Plan ? ?ASA: 3 ? ?Anesthesia Plan: Spinal and Regional  ? ?Post-op Pain Management: Regional block* and Tylenol PO (pre-op)*  ? ?Induction:  ? ?PONV Risk Score and Plan: 2 and Treatment may vary due to age or medical condition, Midazolam, Propofol infusion, Dexamethasone and Ondansetron ? ?Airway Management Planned: Natural Airway ? ?Additional Equipment:  ? ?Intra-op Plan:  ? ?Post-operative Plan:  ? ?Informed Consent: I have reviewed the patients History and Physical, chart, labs and discussed the procedure including the risks, benefits and alternatives for the proposed anesthesia with the patient or authorized representative who has indicated his/her understanding and acceptance.  ? ? ? ?Dental advisory given ? ?Plan Discussed with: CRNA ? ?Anesthesia Plan Comments:   ? ? ? ? ?Anesthesia Quick Evaluation ? ?

## 2022-03-07 ENCOUNTER — Other Ambulatory Visit: Payer: No Typology Code available for payment source

## 2022-03-07 NOTE — Telephone Encounter (Signed)
IC patient. She had some questions about process of FMLA forms being completed but also wanted to make sure that she would receive a block the day of her surgery. She stated that several people she knows had told her she would need this.  ?

## 2022-03-09 ENCOUNTER — Telehealth: Payer: Self-pay | Admitting: Orthopedic Surgery

## 2022-03-09 NOTE — Telephone Encounter (Signed)
Pt submitted medical release form, FMLA forms, and $25.00 cash payment to [employer]. Accepted 03/09/22 ?

## 2022-03-10 ENCOUNTER — Other Ambulatory Visit: Payer: No Typology Code available for payment source

## 2022-03-13 MED ORDER — TRANEXAMIC ACID 1000 MG/10ML IV SOLN
2000.0000 mg | INTRAVENOUS | Status: DC
  Filled 2022-03-13 (×2): qty 20

## 2022-03-14 ENCOUNTER — Observation Stay (HOSPITAL_COMMUNITY)
Admission: RE | Admit: 2022-03-14 | Discharge: 2022-03-15 | Disposition: A | Discharge status: Home Health | Payer: No Typology Code available for payment source | Attending: Orthopedic Surgery | Admitting: Orthopedic Surgery

## 2022-03-14 ENCOUNTER — Ambulatory Visit (HOSPITAL_COMMUNITY): Payer: No Typology Code available for payment source

## 2022-03-14 ENCOUNTER — Ambulatory Visit (HOSPITAL_COMMUNITY): Payer: No Typology Code available for payment source | Admitting: Vascular Surgery

## 2022-03-14 ENCOUNTER — Ambulatory Visit (HOSPITAL_BASED_OUTPATIENT_CLINIC_OR_DEPARTMENT_OTHER): Payer: No Typology Code available for payment source | Admitting: Anesthesiology

## 2022-03-14 ENCOUNTER — Encounter (HOSPITAL_COMMUNITY): Payer: Self-pay | Admitting: Orthopedic Surgery

## 2022-03-14 ENCOUNTER — Other Ambulatory Visit: Payer: Self-pay

## 2022-03-14 ENCOUNTER — Encounter (HOSPITAL_COMMUNITY)
Admission: RE | Disposition: A | Discharge status: Home Health | Payer: Self-pay | Source: Home / Self Care | Attending: Orthopedic Surgery

## 2022-03-14 DIAGNOSIS — Z79899 Other long term (current) drug therapy: Secondary | ICD-10-CM | POA: Insufficient documentation

## 2022-03-14 DIAGNOSIS — J45909 Unspecified asthma, uncomplicated: Secondary | ICD-10-CM | POA: Diagnosis not present

## 2022-03-14 DIAGNOSIS — E119 Type 2 diabetes mellitus without complications: Secondary | ICD-10-CM | POA: Insufficient documentation

## 2022-03-14 DIAGNOSIS — Z794 Long term (current) use of insulin: Secondary | ICD-10-CM

## 2022-03-14 DIAGNOSIS — Z8616 Personal history of COVID-19: Secondary | ICD-10-CM | POA: Diagnosis not present

## 2022-03-14 DIAGNOSIS — Z87891 Personal history of nicotine dependence: Secondary | ICD-10-CM | POA: Diagnosis not present

## 2022-03-14 DIAGNOSIS — Z96652 Presence of left artificial knee joint: Secondary | ICD-10-CM

## 2022-03-14 DIAGNOSIS — G4730 Sleep apnea, unspecified: Secondary | ICD-10-CM

## 2022-03-14 DIAGNOSIS — M1712 Unilateral primary osteoarthritis, left knee: Secondary | ICD-10-CM

## 2022-03-14 DIAGNOSIS — Z01818 Encounter for other preprocedural examination: Secondary | ICD-10-CM

## 2022-03-14 HISTORY — PX: TOTAL KNEE ARTHROPLASTY: SHX125

## 2022-03-14 LAB — GLUCOSE, CAPILLARY
Glucose-Capillary: 114 mg/dL — ABNORMAL HIGH (ref 70–99)
Glucose-Capillary: 130 mg/dL — ABNORMAL HIGH (ref 70–99)
Glucose-Capillary: 168 mg/dL — ABNORMAL HIGH (ref 70–99)
Glucose-Capillary: 220 mg/dL — ABNORMAL HIGH (ref 70–99)
Glucose-Capillary: 205 mg/dL — ABNORMAL HIGH (ref 70–99)

## 2022-03-14 SURGERY — ARTHROPLASTY, KNEE, TOTAL
Anesthesia: Regional | Site: Knee | Laterality: Left

## 2022-03-14 MED ORDER — METOCLOPRAMIDE HCL 5 MG PO TABS: 5.0000 mg | ORAL_TABLET | Freq: Three times a day (TID) | ORAL | Status: DC | PRN

## 2022-03-14 MED ORDER — VANCOMYCIN HCL 1000 MG IV SOLR
INTRAVENOUS | Status: DC | PRN
  Administered 2022-03-14: 1000 mg via TOPICAL

## 2022-03-14 MED ORDER — HYDROMORPHONE HCL 1 MG/ML IJ SOLN
0.5000 mg | INTRAMUSCULAR | Status: DC | PRN
  Administered 2022-03-15: 1 mg via INTRAVENOUS
  Filled 2022-03-14: qty 1

## 2022-03-14 MED ORDER — FENTANYL CITRATE (PF) 100 MCG/2ML IJ SOLN
INTRAMUSCULAR | Status: AC
  Filled 2022-03-14: qty 2

## 2022-03-14 MED ORDER — CEFAZOLIN SODIUM 1 G IJ SOLR
INTRAMUSCULAR | Status: AC
  Filled 2022-03-14: qty 20

## 2022-03-14 MED ORDER — METOCLOPRAMIDE HCL 5 MG/ML IJ SOLN: 5.0000 mg | Freq: Three times a day (TID) | INTRAMUSCULAR | Status: DC | PRN

## 2022-03-14 MED ORDER — ONDANSETRON HCL 4 MG/2ML IJ SOLN
INTRAMUSCULAR | Status: AC
  Filled 2022-03-14: qty 2

## 2022-03-14 MED ORDER — ACETAMINOPHEN 500 MG PO TABS
1000.0000 mg | ORAL_TABLET | Freq: Once | ORAL | Status: AC
  Administered 2022-03-14: 1000 mg via ORAL
  Filled 2022-03-14: qty 2

## 2022-03-14 MED ORDER — BUPIVACAINE LIPOSOME 1.3 % IJ SUSP
INTRAMUSCULAR | Status: DC | PRN
  Administered 2022-03-14: 20 mL

## 2022-03-14 MED ORDER — LACTATED RINGERS IV SOLN
INTRAVENOUS | Status: DC
Start: 2022-03-14 — End: 2022-03-14

## 2022-03-14 MED ORDER — METHOCARBAMOL 500 MG PO TABS
500.0000 mg | ORAL_TABLET | Freq: Four times a day (QID) | ORAL | Status: DC | PRN
  Administered 2022-03-14 – 2022-03-15 (×2): 500 mg via ORAL
  Filled 2022-03-14 (×2): qty 1

## 2022-03-14 MED ORDER — CLONIDINE HCL (ANALGESIA) 100 MCG/ML EP SOLN
EPIDURAL | Status: DC | PRN
  Administered 2022-03-14: 1 mL

## 2022-03-14 MED ORDER — INSULIN ASPART 100 UNIT/ML IJ SOLN: 0.0000 [IU] | INTRAMUSCULAR | Status: DC | PRN

## 2022-03-14 MED ORDER — GLYCOPYRROLATE PF 0.2 MG/ML IJ SOSY
PREFILLED_SYRINGE | INTRAMUSCULAR | Status: AC
  Filled 2022-03-14: qty 1

## 2022-03-14 MED ORDER — MENTHOL 3 MG MT LOZG: 1.0000 | LOZENGE | OROMUCOSAL | Status: DC | PRN

## 2022-03-14 MED ORDER — POVIDONE-IODINE 10 % EX SWAB
2.0000 "application " | Freq: Once | CUTANEOUS | Status: AC
  Administered 2022-03-14: 2 via TOPICAL

## 2022-03-14 MED ORDER — OXYCODONE HCL 5 MG PO TABS
5.0000 mg | ORAL_TABLET | ORAL | Status: DC | PRN
  Administered 2022-03-14: 5 mg via ORAL
  Administered 2022-03-14 – 2022-03-15 (×3): 10 mg via ORAL
  Filled 2022-03-14: qty 1
  Filled 2022-03-14 (×4): qty 2

## 2022-03-14 MED ORDER — CELECOXIB 100 MG PO CAPS
100.0000 mg | ORAL_CAPSULE | Freq: Two times a day (BID) | ORAL | Status: DC
  Administered 2022-03-14 – 2022-03-15 (×2): 100 mg via ORAL
  Filled 2022-03-14 (×3): qty 1

## 2022-03-14 MED ORDER — DOCUSATE SODIUM 100 MG PO CAPS
100.0000 mg | ORAL_CAPSULE | Freq: Two times a day (BID) | ORAL | Status: DC
  Administered 2022-03-14 – 2022-03-15 (×2): 100 mg via ORAL
  Filled 2022-03-14 (×3): qty 1

## 2022-03-14 MED ORDER — ACETAMINOPHEN 325 MG PO TABS: 325.0000 mg | ORAL_TABLET | Freq: Four times a day (QID) | ORAL | Status: DC | PRN

## 2022-03-14 MED ORDER — PHENYLEPHRINE 80 MCG/ML (10ML) SYRINGE FOR IV PUSH (FOR BLOOD PRESSURE SUPPORT)
PREFILLED_SYRINGE | INTRAVENOUS | Status: DC | PRN
  Administered 2022-03-14: 40 ug via INTRAVENOUS
  Administered 2022-03-14: 80 ug via INTRAVENOUS
  Administered 2022-03-14: 40 ug via INTRAVENOUS
  Administered 2022-03-14: 80 ug via INTRAVENOUS

## 2022-03-14 MED ORDER — CHLORHEXIDINE GLUCONATE 0.12 % MT SOLN
15.0000 mL | Freq: Once | OROMUCOSAL | Status: AC
  Administered 2022-03-14: 15 mL via OROMUCOSAL
  Filled 2022-03-14: qty 15

## 2022-03-14 MED ORDER — ROPIVACAINE HCL 5 MG/ML IJ SOLN
INTRAMUSCULAR | Status: DC | PRN
  Administered 2022-03-14: 20 mL via PERINEURAL

## 2022-03-14 MED ORDER — BUPIVACAINE HCL 0.25 % IJ SOLN
INTRAMUSCULAR | Status: DC | PRN
  Administered 2022-03-14: 30 mL

## 2022-03-14 MED ORDER — METHOCARBAMOL 1000 MG/10ML IJ SOLN
500.0000 mg | Freq: Four times a day (QID) | INTRAVENOUS | Status: DC | PRN
  Filled 2022-03-14: qty 5

## 2022-03-14 MED ORDER — LIDOCAINE 2% (20 MG/ML) 5 ML SYRINGE
INTRAMUSCULAR | Status: DC | PRN
  Administered 2022-03-14 (×2): 20 mg via INTRAVENOUS

## 2022-03-14 MED ORDER — MIDAZOLAM HCL 2 MG/2ML IJ SOLN
INTRAMUSCULAR | Status: AC
  Filled 2022-03-14: qty 2

## 2022-03-14 MED ORDER — LIDOCAINE 2% (20 MG/ML) 5 ML SYRINGE
INTRAMUSCULAR | Status: AC
  Filled 2022-03-14: qty 5

## 2022-03-14 MED ORDER — BUPIVACAINE IN DEXTROSE 0.75-8.25 % IT SOLN
INTRATHECAL | Status: DC | PRN
  Administered 2022-03-14: 1.8 mL via INTRATHECAL

## 2022-03-14 MED ORDER — TRANEXAMIC ACID-NACL 1000-0.7 MG/100ML-% IV SOLN
1000.0000 mg | INTRAVENOUS | Status: AC
  Administered 2022-03-14: 1000 mg via INTRAVENOUS
  Filled 2022-03-14: qty 100

## 2022-03-14 MED ORDER — INSULIN ASPART 100 UNIT/ML IJ SOLN
0.0000 [IU] | Freq: Three times a day (TID) | INTRAMUSCULAR | Status: DC
  Administered 2022-03-14: 3 [IU] via SUBCUTANEOUS
  Administered 2022-03-15: 2 [IU] via SUBCUTANEOUS
  Administered 2022-03-15: 3 [IU] via SUBCUTANEOUS

## 2022-03-14 MED ORDER — FENTANYL CITRATE (PF) 100 MCG/2ML IJ SOLN
25.0000 ug | INTRAMUSCULAR | Status: DC | PRN
  Administered 2022-03-14 (×3): 50 ug via INTRAVENOUS

## 2022-03-14 MED ORDER — 0.9 % SODIUM CHLORIDE (POUR BTL) OPTIME
TOPICAL | Status: DC | PRN
  Administered 2022-03-14: 10000 mL

## 2022-03-14 MED ORDER — BUPIVACAINE HCL (PF) 0.25 % IJ SOLN
INTRAMUSCULAR | Status: AC
  Filled 2022-03-14: qty 30

## 2022-03-14 MED ORDER — DEXAMETHASONE SODIUM PHOSPHATE 10 MG/ML IJ SOLN
INTRAMUSCULAR | Status: AC
  Filled 2022-03-14: qty 1

## 2022-03-14 MED ORDER — POVIDONE-IODINE 7.5 % EX SOLN: Freq: Once | CUTANEOUS | Status: DC

## 2022-03-14 MED ORDER — PROPOFOL 500 MG/50ML IV EMUL
INTRAVENOUS | Status: DC | PRN
  Administered 2022-03-14: 100 ug/kg/min via INTRAVENOUS
  Administered 2022-03-14: 25 ug/kg/min via INTRAVENOUS

## 2022-03-14 MED ORDER — ACETAMINOPHEN 500 MG PO TABS
1000.0000 mg | ORAL_TABLET | Freq: Four times a day (QID) | ORAL | Status: AC
  Administered 2022-03-14 – 2022-03-15 (×4): 1000 mg via ORAL
  Filled 2022-03-14 (×4): qty 2

## 2022-03-14 MED ORDER — CEFAZOLIN SODIUM-DEXTROSE 2-4 GM/100ML-% IV SOLN
2.0000 g | Freq: Three times a day (TID) | INTRAVENOUS | Status: AC
  Administered 2022-03-14 – 2022-03-15 (×2): 2 g via INTRAVENOUS
  Filled 2022-03-14 (×2): qty 100

## 2022-03-14 MED ORDER — VITAMIN D (ERGOCALCIFEROL) 1.25 MG (50000 UNIT) PO CAPS
50000.0000 [IU] | ORAL_CAPSULE | ORAL | Status: DC
  Administered 2022-03-15: 50000 [IU] via ORAL
  Filled 2022-03-14: qty 1

## 2022-03-14 MED ORDER — HYDROMORPHONE HCL 1 MG/ML IJ SOLN
INTRAMUSCULAR | Status: AC
  Filled 2022-03-14: qty 1

## 2022-03-14 MED ORDER — MORPHINE SULFATE (PF) 4 MG/ML IV SOLN
INTRAVENOUS | Status: AC
  Filled 2022-03-14: qty 2

## 2022-03-14 MED ORDER — SODIUM CHLORIDE 0.9 % IR SOLN
Status: DC | PRN
  Administered 2022-03-14: 3000 mL

## 2022-03-14 MED ORDER — CEFAZOLIN SODIUM-DEXTROSE 2-4 GM/100ML-% IV SOLN
2.0000 g | INTRAVENOUS | Status: AC
  Administered 2022-03-14 (×2): 2 g via INTRAVENOUS
  Filled 2022-03-14: qty 100

## 2022-03-14 MED ORDER — PHENOL 1.4 % MT LIQD: 1.0000 | OROMUCOSAL | Status: DC | PRN

## 2022-03-14 MED ORDER — TRANEXAMIC ACID 1000 MG/10ML IV SOLN
INTRAVENOUS | Status: DC | PRN
  Administered 2022-03-14: 2000 mg via TOPICAL

## 2022-03-14 MED ORDER — FENTANYL CITRATE (PF) 250 MCG/5ML IJ SOLN
INTRAMUSCULAR | Status: DC | PRN
  Administered 2022-03-14 (×2): 50 ug via INTRAVENOUS

## 2022-03-14 MED ORDER — ONDANSETRON HCL 4 MG/2ML IJ SOLN
INTRAMUSCULAR | Status: DC | PRN
  Administered 2022-03-14: 4 mg via INTRAVENOUS

## 2022-03-14 MED ORDER — ALBUTEROL SULFATE (2.5 MG/3ML) 0.083% IN NEBU: 3.0000 mL | INHALATION_SOLUTION | RESPIRATORY_TRACT | Status: DC | PRN

## 2022-03-14 MED ORDER — MIDAZOLAM HCL 2 MG/2ML IJ SOLN
INTRAMUSCULAR | Status: DC | PRN
  Administered 2022-03-14: 2 mg via INTRAVENOUS

## 2022-03-14 MED ORDER — ASPIRIN 81 MG PO CHEW
81.0000 mg | CHEWABLE_TABLET | Freq: Two times a day (BID) | ORAL | Status: DC
  Administered 2022-03-14 – 2022-03-15 (×2): 81 mg via ORAL
  Filled 2022-03-14 (×2): qty 1

## 2022-03-14 MED ORDER — GLYCOPYRROLATE PF 0.2 MG/ML IJ SOSY
PREFILLED_SYRINGE | INTRAMUSCULAR | Status: DC | PRN
  Administered 2022-03-14: .2 mg via INTRAVENOUS

## 2022-03-14 MED ORDER — PROPOFOL 10 MG/ML IV BOLUS
INTRAVENOUS | Status: DC | PRN
  Administered 2022-03-14 (×2): 50 mg via INTRAVENOUS

## 2022-03-14 MED ORDER — ONDANSETRON HCL 4 MG PO TABS
4.0000 mg | ORAL_TABLET | Freq: Four times a day (QID) | ORAL | Status: DC | PRN
Start: 2022-03-14 — End: 2022-03-15
  Administered 2022-03-15: 4 mg via ORAL
  Filled 2022-03-14: qty 1

## 2022-03-14 MED ORDER — VANCOMYCIN HCL 1000 MG IV SOLR
INTRAVENOUS | Status: AC
  Filled 2022-03-14: qty 20

## 2022-03-14 MED ORDER — LACTATED RINGERS IV SOLN: INTRAVENOUS | Status: DC

## 2022-03-14 MED ORDER — IRRISEPT - 450ML BOTTLE WITH 0.05% CHG IN STERILE WATER, USP 99.95% OPTIME
TOPICAL | Status: DC | PRN
  Administered 2022-03-14: 450 mL via TOPICAL

## 2022-03-14 MED ORDER — DEXAMETHASONE SODIUM PHOSPHATE 10 MG/ML IJ SOLN
INTRAMUSCULAR | Status: DC | PRN
  Administered 2022-03-14 (×2): 5 mg

## 2022-03-14 MED ORDER — BUPIVACAINE LIPOSOME 1.3 % IJ SUSP
INTRAMUSCULAR | Status: AC
  Filled 2022-03-14: qty 20

## 2022-03-14 MED ORDER — ONDANSETRON HCL 4 MG/2ML IJ SOLN: 4.0000 mg | Freq: Four times a day (QID) | INTRAMUSCULAR | Status: DC | PRN

## 2022-03-14 MED ORDER — INSULIN ASPART 100 UNIT/ML IJ SOLN
0.0000 [IU] | Freq: Every day | INTRAMUSCULAR | Status: DC
  Administered 2022-03-14: 2 [IU] via SUBCUTANEOUS

## 2022-03-14 MED ORDER — PANTOPRAZOLE SODIUM 40 MG PO TBEC
40.0000 mg | DELAYED_RELEASE_TABLET | Freq: Every day | ORAL | Status: DC
  Administered 2022-03-15: 40 mg via ORAL
  Filled 2022-03-14: qty 1

## 2022-03-14 MED ORDER — HYDROMORPHONE HCL 1 MG/ML IJ SOLN
0.2500 mg | INTRAMUSCULAR | Status: DC | PRN
  Administered 2022-03-14 (×4): 0.5 mg via INTRAVENOUS

## 2022-03-14 MED ORDER — PROPOFOL 10 MG/ML IV BOLUS
INTRAVENOUS | Status: AC
  Filled 2022-03-14: qty 20

## 2022-03-14 MED ORDER — SODIUM CHLORIDE 0.9% FLUSH
INTRAVENOUS | Status: DC | PRN
  Administered 2022-03-14: 20 mL

## 2022-03-14 MED ORDER — MORPHINE SULFATE (PF) 4 MG/ML IV SOLN
INTRAVENOUS | Status: DC | PRN
  Administered 2022-03-14: 8 mg via INTRAMUSCULAR

## 2022-03-14 MED ORDER — FENTANYL CITRATE (PF) 250 MCG/5ML IJ SOLN
INTRAMUSCULAR | Status: AC
  Filled 2022-03-14: qty 5

## 2022-03-14 MED ORDER — ORAL CARE MOUTH RINSE: 15.0000 mL | Freq: Once | OROMUCOSAL | Status: AC

## 2022-03-14 MED ORDER — POVIDONE-IODINE 10 % EX SWAB: 2.0000 "application " | Freq: Once | CUTANEOUS | Status: DC

## 2022-03-14 MED ORDER — CLONIDINE HCL (ANALGESIA) 100 MCG/ML EP SOLN
EPIDURAL | Status: AC
  Filled 2022-03-14: qty 10

## 2022-03-14 SURGICAL SUPPLY — 102 items
BAG COUNTER SPONGE SURGICOUNT (BAG) ×2 IMPLANT
BAG DECANTER FOR FLEXI CONT (MISCELLANEOUS) ×2 IMPLANT
BAG SPNG CNTER NS LX DISP (BAG) ×1
BANDAGE ESMARK 6X9 LF (GAUZE/BANDAGES/DRESSINGS) ×1 IMPLANT
BASEPLATE TIBIAL SZ2 TRI (Joint) IMPLANT
BIT DRILL 2.0 (BIT) ×2
BIT DRILL 2.5X2.75 QC CALB (BIT) ×1 IMPLANT
BIT DRILL 2XNS DISP SS SM FRAG (BIT) IMPLANT
BIT DRL 2XNS DISP SS SM FRAG (BIT) ×1
BLADE SAG 18X100X1.27 (BLADE) ×2 IMPLANT
BLADE SAGITTAL (BLADE) ×2
BLADE SAW THK.89X75X18XSGTL (BLADE) ×1 IMPLANT
BNDG CMPR 9X6 STRL LF SNTH (GAUZE/BANDAGES/DRESSINGS) ×1
BNDG CMPR MED 15X6 ELC VLCR LF (GAUZE/BANDAGES/DRESSINGS) ×1
BNDG COHESIVE 6X5 TAN STRL LF (GAUZE/BANDAGES/DRESSINGS) ×1 IMPLANT
BNDG ELASTIC 6X15 VLCR STRL LF (GAUZE/BANDAGES/DRESSINGS) ×2 IMPLANT
BNDG ESMARK 6X9 LF (GAUZE/BANDAGES/DRESSINGS) ×2
BOWL SMART MIX CTS (DISPOSABLE) IMPLANT
BSPLAT TIB 2 KN TRITANIUM (Joint) ×1 IMPLANT
CNTNR URN SCR LID CUP LEK RST (MISCELLANEOUS) ×1 IMPLANT
COMP FEM CMTLS TRIATH SZ1 LT (Joint) ×2 IMPLANT
COMPONENT FEM CMTLS TRTH SZ1LT (Joint) IMPLANT
CONT SPEC 4OZ STRL OR WHT (MISCELLANEOUS) ×2
COOLER ICEMAN CLASSIC (MISCELLANEOUS) ×1 IMPLANT
COVER SURGICAL LIGHT HANDLE (MISCELLANEOUS) ×2 IMPLANT
CUFF TOURN SGL QUICK 34 (TOURNIQUET CUFF) ×2
CUFF TOURN SGL QUICK 42 (TOURNIQUET CUFF) IMPLANT
CUFF TRNQT CYL 34X4.125X (TOURNIQUET CUFF) ×1 IMPLANT
DECANTER SPIKE VIAL GLASS SM (MISCELLANEOUS) ×2 IMPLANT
DRAPE INCISE IOBAN 66X45 STRL (DRAPES) IMPLANT
DRAPE ORTHO SPLIT 77X108 STRL (DRAPES) ×6
DRAPE SURG ORHT 6 SPLT 77X108 (DRAPES) ×3 IMPLANT
DRAPE U-SHAPE 47X51 STRL (DRAPES) ×2 IMPLANT
DRSG AQUACEL AG ADV 3.5X14 (GAUZE/BANDAGES/DRESSINGS) ×1 IMPLANT
DURAPREP 26ML APPLICATOR (WOUND CARE) ×4 IMPLANT
ELECT CAUTERY BLADE 6.4 (BLADE) ×2 IMPLANT
ELECT REM PT RETURN 9FT ADLT (ELECTROSURGICAL) ×2
ELECTRODE REM PT RTRN 9FT ADLT (ELECTROSURGICAL) ×1 IMPLANT
GAUZE SPONGE 4X4 12PLY STRL (GAUZE/BANDAGES/DRESSINGS) ×2 IMPLANT
GLOVE BIOGEL PI IND STRL 7.0 (GLOVE) ×1 IMPLANT
GLOVE BIOGEL PI IND STRL 8 (GLOVE) ×1 IMPLANT
GLOVE BIOGEL PI INDICATOR 7.0 (GLOVE) ×1
GLOVE BIOGEL PI INDICATOR 8 (GLOVE) ×1
GLOVE ECLIPSE 7.0 STRL STRAW (GLOVE) ×2 IMPLANT
GLOVE ECLIPSE 8.0 STRL XLNG CF (GLOVE) ×2 IMPLANT
GLOVE SURG ENC MOIS LTX SZ6.5 (GLOVE) ×6 IMPLANT
GOWN STRL REUS W/ TWL LRG LVL3 (GOWN DISPOSABLE) ×3 IMPLANT
GOWN STRL REUS W/TWL LRG LVL3 (GOWN DISPOSABLE) ×6
HANDPIECE INTERPULSE COAX TIP (DISPOSABLE) ×2
HOOD PEEL AWAY FLYTE STAYCOOL (MISCELLANEOUS) ×6 IMPLANT
IMMOBILIZER KNEE 20 (SOFTGOODS)
IMMOBILIZER KNEE 20 THIGH 36 (SOFTGOODS) IMPLANT
IMMOBILIZER KNEE 22 UNIV (SOFTGOODS) IMPLANT
IMMOBILIZER KNEE 24 THIGH 36 (MISCELLANEOUS) IMPLANT
IMMOBILIZER KNEE 24 UNIV (MISCELLANEOUS)
INSERT TIB BEAR TRIATH SZ2 11 (Insert) ×1 IMPLANT
KIT BASIN OR (CUSTOM PROCEDURE TRAY) ×2 IMPLANT
KIT TURNOVER KIT B (KITS) ×2 IMPLANT
KNEE PATELLA ASYMMETRIC 9X29 (Knees) ×1 IMPLANT
MANIFOLD NEPTUNE II (INSTRUMENTS) ×2 IMPLANT
NDL SPNL 18GX3.5 QUINCKE PK (NEEDLE) ×1 IMPLANT
NEEDLE 22X1 1/2 (OR ONLY) (NEEDLE) ×4 IMPLANT
NEEDLE SPNL 18GX3.5 QUINCKE PK (NEEDLE) ×2 IMPLANT
NS IRRIG 1000ML POUR BTL (IV SOLUTION) ×6 IMPLANT
PACK TOTAL JOINT (CUSTOM PROCEDURE TRAY) ×2 IMPLANT
PAD ARMBOARD 7.5X6 YLW CONV (MISCELLANEOUS) ×4 IMPLANT
PAD CAST 4YDX4 CTTN HI CHSV (CAST SUPPLIES) ×1 IMPLANT
PAD COLD SHLDR WRAP-ON (PAD) ×1 IMPLANT
PADDING CAST COTTON 4X4 STRL (CAST SUPPLIES) ×2
PADDING CAST COTTON 6X4 STRL (CAST SUPPLIES) ×2 IMPLANT
PIN FLUTED HEDLESS FIX 3.5X1/8 (PIN) ×1 IMPLANT
PLATE LOCK COMP 5H FOOT (Plate) ×1 IMPLANT
SCREW CORTICAL 3.5MM  16MM (Screw) ×2 IMPLANT
SCREW CORTICAL 3.5MM  28MM (Screw) ×2 IMPLANT
SCREW CORTICAL 3.5MM  30MM (Screw) ×2 IMPLANT
SCREW CORTICAL 3.5MM  34MM (Screw) ×5 IMPLANT
SCREW CORTICAL 3.5MM  44MM (Screw) ×2 IMPLANT
SCREW CORTICAL 3.5MM 16MM (Screw) IMPLANT
SCREW CORTICAL 3.5MM 28MM (Screw) IMPLANT
SCREW CORTICAL 3.5MM 30MM (Screw) IMPLANT
SCREW CORTICAL 3.5MM 34MM (Screw) IMPLANT
SCREW CORTICAL 3.5MM 44MM (Screw) IMPLANT
SCREW LOCK CORT STAR 3.5X16 (Screw) ×1 IMPLANT
SET HNDPC FAN SPRY TIP SCT (DISPOSABLE) ×1 IMPLANT
SPONGE T-LAP 18X18 ~~LOC~~+RFID (SPONGE) ×3 IMPLANT
STRIP CLOSURE SKIN 1/2X4 (GAUZE/BANDAGES/DRESSINGS) ×4 IMPLANT
SUCTION FRAZIER HANDLE 10FR (MISCELLANEOUS) ×2
SUCTION TUBE FRAZIER 10FR DISP (MISCELLANEOUS) ×1 IMPLANT
SUT MNCRL AB 3-0 PS2 18 (SUTURE) ×2 IMPLANT
SUT VIC AB 0 CT1 27 (SUTURE) ×8
SUT VIC AB 0 CT1 27XBRD ANBCTR (SUTURE) ×3 IMPLANT
SUT VIC AB 1 CT1 36 (SUTURE) ×13 IMPLANT
SUT VIC AB 2-0 CT1 27 (SUTURE) ×10
SUT VIC AB 2-0 CT1 TAPERPNT 27 (SUTURE) ×4 IMPLANT
SYR 30ML LL (SYRINGE) ×6 IMPLANT
SYR TB 1ML LUER SLIP (SYRINGE) ×2 IMPLANT
TIBIAL BASEPLATE SZ2 TRI (Joint) ×2 IMPLANT
TOWEL GREEN STERILE (TOWEL DISPOSABLE) ×4 IMPLANT
TOWEL GREEN STERILE FF (TOWEL DISPOSABLE) ×4 IMPLANT
TRAY CATH 16FR W/PLASTIC CATH (SET/KITS/TRAYS/PACK) IMPLANT
WATER STERILE IRR 1000ML POUR (IV SOLUTION) IMPLANT
YANKAUER SUCT BULB TIP NO VENT (SUCTIONS) ×2 IMPLANT

## 2022-03-14 NOTE — Anesthesia Procedure Notes (Signed)
Spinal ? ?Patient location during procedure: OR ?Start time: 03/14/2022 7:30 AM ?End time: 03/14/2022 7:33 AM ?Reason for block: surgical anesthesia ?Staffing ?Performed: anesthesiologist  ?Anesthesiologist: Freddrick March, MD ?Preanesthetic Checklist ?Completed: patient identified, IV checked, risks and benefits discussed, surgical consent, monitors and equipment checked, pre-op evaluation and timeout performed ?Spinal Block ?Patient position: sitting ?Prep: DuraPrep and site prepped and draped ?Patient monitoring: cardiac monitor, continuous pulse ox and blood pressure ?Approach: midline ?Location: L3-4 ?Injection technique: single-shot ?Needle ?Needle type: Pencan  ?Needle gauge: 24 G ?Needle length: 9 cm ?Assessment ?Sensory level: T6 ?Events: CSF return ?Additional Notes ?Functioning IV was confirmed and monitors were applied. Sterile prep and drape, including hand hygiene and sterile gloves were used. The patient was positioned and the spine was prepped. The skin was anesthetized with lidocaine.  Free flow of clear CSF was obtained prior to injecting local anesthetic into the CSF.  The spinal needle aspirated freely following injection.  The needle was carefully withdrawn.  The patient tolerated the procedure well.  ? ? ? ?

## 2022-03-14 NOTE — Progress Notes (Signed)
Orthopedic Tech Progress Note ?Patient Details:  ?Stephanie Chandler ?01/24/1966 ?[id number] ? ?Ortho Devices ?Type of Ortho Device: Bone foam zero knee ?Ortho Device/Splint Location: LLE ?Ortho Device/Splint Interventions: Application ?  ?Post Interventions ?Patient Tolerated: Fair ? ?Dois Juarbe OTR/L ?03/14/2022, 6:46 PM ? ?

## 2022-03-14 NOTE — Plan of Care (Signed)

## 2022-03-14 NOTE — Anesthesia Postprocedure Evaluation (Signed)
Anesthesia Post Note ? ?Patient: MERCEDEES CONVERY ? ?Procedure(s) Performed: LEFT TOTAL KNEE ARTHROPLASTY (Left: Knee) ? ?  ? ?Patient location during evaluation: PACU ?Anesthesia Type: Regional and Spinal ?Level of consciousness: oriented and awake and alert ?Pain management: pain level controlled ?Vital Signs Assessment: post-procedure vital signs reviewed and stable ?Respiratory status: spontaneous breathing, respiratory function stable and patient connected to nasal cannula oxygen ?Cardiovascular status: blood pressure returned to baseline and stable ?Postop Assessment: no headache, no backache and no apparent nausea or vomiting ?Anesthetic complications: no ? ? ?No notable events documented. ? ?Last Vitals:  ?Vitals:  ? 03/14/22 1450 03/14/22 1550  ?BP: 125/86 125/82  ?Pulse: 63 66  ?Resp: 13 14  ?Temp:    ?SpO2: 99% 97%  ?  ?Last Pain:  ?Vitals:  ? 03/14/22 1500  ?TempSrc:   ?PainSc: Asleep  ? ? ?  ?  ?  ?  ?  ?  ? ?Peta Peachey L Lindsea Olivar ? ? ? ? ?

## 2022-03-14 NOTE — Anesthesia Procedure Notes (Signed)
Anesthesia Regional Block: Adductor canal block  ? ?Pre-Anesthetic Checklist: , timeout performed,  Correct Patient, Correct Site, Correct Laterality,  Correct Procedure, Correct Position, site marked,  Risks and benefits discussed,  Surgical consent,  Pre-op evaluation,  At surgeon's request and post-op pain management ? ?Laterality: Left ? ?Prep: Maximum Sterile Barrier Precautions used, chloraprep     ?  ?Needles:  ?Injection technique: Single-shot ? ?Needle Type: Echogenic Stimulator Needle   ? ? ?Needle Length: 9cm  ?Needle Gauge: 22  ? ? ? ?Additional Needles: ? ? ?Procedures:,,,, ultrasound used (permanent image in chart),,    ?Narrative:  ?Start time: 03/14/2022 7:10 AM ?End time: 03/14/2022 7:14 AM ?Injection made incrementally with aspirations every 5 mL. ? ?Performed by: Personally  ?Anesthesiologist: Elmer Picker, MD ? ?Additional Notes: ?Monitors applied. No increased pain on injection. No increased resistance to injection. Injection made in 5cc increments. Good needle visualization. Patient tolerated procedure well.  ? ? ? ? ?

## 2022-03-14 NOTE — H&P (Signed)
TOTAL KNEE ADMISSION H&P ? ?Patient is being admitted for left total knee arthroplasty. ? ?Subjective: ? ?Chief Complaint:left knee pain. ? ?HPI: Stephanie Chandler, 56 y.o. female, has a history of pain and functional disability in the left knee due to arthritis and has failed non-surgical conservative treatments for greater than 12 weeks to includeNSAID's and/or analgesics, corticosteriod injections, flexibility and strengthening excercises, weight reduction as appropriate, and activity modification.  Onset of symptoms was abrupt, starting 2 years ago with rapidlly worsening course since that time. The patient noted no past surgery on the left knee(s).  Patient currently rates pain in the left knee(s) at 9 out of 10 with activity. Patient has night pain, worsening of pain with activity and weight bearing, pain that interferes with activities of daily living, pain with passive range of motion, crepitus, and joint swelling.  Patient has evidence of subchondral sclerosis and joint space narrowing by imaging studies. This patient has had  an MRI scan which shows significant medial compartment arthritis along with meniscal root pathology which has accelerated her pre-existing arthritis in the medial compartment.  Other compartments also have arthritis but are less affected than the medial compartment . There is no active infection. ? ?Patient Active Problem List  ? Diagnosis Date Noted  ? Vitamin D deficiency 02/07/2022  ? Adjustment disorder with depressed mood 02/07/2022  ? Hyperlipidemia, unspecified 12/12/2021  ? 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 Urge incontinence of urine 12/12/2021  ? Degeneration of lumbar intervertebral disc 12/12/2021  ? Halitosis 07/25/2021  ? Abdominal bloating 06/23/2021  ? Flatulence, eructation and gas pain 06/23/2021  ? Chronic pain 11/04/2020  ? Exacerbation of asthma 11/04/2020  ? Hyperglycemia due to type 2 diabetes mellitus (HCC) 11/04/2020  ?  Mild intermittent asthma 11/04/2020  ? Intractable hiccups 11/10/2019  ? COVID-19 virus infection 11/10/2019  ? Morbid obesity (HCC) 12/12/2018  ? Acute pain of right shoulder 03/15/2017  ? Strain of right trapezius muscle 03/15/2017  ? Musculoskeletal pain 03/15/2017  ? OSA (obstructive sleep apnea) 08/23/2015  ? Sciatica 11/13/2012  ? ?Past Medical History:  ?Diagnosis Date  ? Arthritis   ? Asthma   ? Diabetes mellitus without complication (HCC)   ? type 2  ? Sleep apnea   ? pt states "one doctor told me to wear CPAP, another told me I didn't need to, so I do not wear a CPAP"  ?  ?Past Surgical History:  ?Procedure Laterality Date  ? ABDOMINAL HYSTERECTOMY    ? CATARACT EXTRACTION Bilateral 2018  ? HYSTERECTOMY ABDOMINAL WITH SALPINGECTOMY  1997  ?  ?Current Facility-Administered Medications  ?Medication Dose Route Frequency Provider Last Rate Last Admin  ? ceFAZolin (ANCEF) IVPB 2g/100 mL premix  2 g Intravenous On Call to OR Magnant, Charles L, PA-C      ? insulin aspart (novoLOG) injection 0-14 Units  0-14 Units Subcutaneous Q2H PRN Elmer Picker, MD      ? lactated ringers infusion   Intravenous Continuous Jairo Ben, MD   New Bag at 03/14/22 [phone]  ? povidone-iodine (BETADINE) 7.5 % scrub   Topical Once Magnant, Joycie Peek, PA-C      ? povidone-iodine 10 % swab 2 application.  2 application. Topical Once Magnant, Charles L, PA-C      ? tranexamic acid (CYKLOKAPRON) 2,000 mg in sodium chloride 0.9 % 50 mL Topical Application  2,000 mg Topical To OR Scarlett Presto, RPH      ? tranexamic  acid (CYKLOKAPRON) IVPB 1,000 mg  1,000 mg Intravenous To OR Magnant, Charles L, PA-C      ? ?Facility-Administered Medications Ordered in Other Encounters  ?Medication Dose Route Frequency Provider Last Rate Last Admin  ? fentaNYL citrate (PF) (SUBLIMAZE) injection   Intravenous Anesthesia Intra-op Shary Decamp, CRNA   50 mcg at 03/14/22 4827  ? midazolam (VERSED) injection   Intravenous Anesthesia Intra-op  Shary Decamp, CRNA   2 mg at 03/14/22 0786  ? ?Allergies  ?Allergen Reactions  ? Flagyl [Metronidazole] Hives  ?  ?Social History  ? ?Tobacco Use  ? Smoking status: Former  ?  Packs/day: 0.12  ?  Years: 0.50  ?  Pack years: 0.06  ?  Types: Cigarettes  ?  Quit date: 2021  ?  Years since quitting: 2.2  ? Smokeless tobacco: Never  ?Substance Use Topics  ? Alcohol use: Not Currently  ?  ?Family History  ?Problem Relation Age of Onset  ? Diabetes Mother   ? Hypertension Mother   ? Arthritis Mother   ? Diabetes Father   ? Kidney disease Father   ? Heart disease Maternal Grandmother   ? Diabetes Paternal Grandmother   ?  ? ?Review of Systems  ?Musculoskeletal:  Positive for arthralgias.  ?All other systems reviewed and are negative. ? ?Objective: ? ?Physical Exam ?Vitals reviewed.  ?HENT:  ?   Head: Normocephalic.  ?   Right Ear: Tympanic membrane normal.  ?   Nose: Nose normal.  ?   Mouth/Throat:  ?   Mouth: Mucous membranes are moist.  ?Eyes:  ?   Pupils: Pupils are equal, round, and reactive to light.  ?Cardiovascular:  ?   Rate and Rhythm: Normal rate.  ?   Pulses: Normal pulses.  ?Pulmonary:  ?   Effort: Pulmonary effort is normal.  ?Abdominal:  ?   General: Abdomen is flat.  ?Musculoskeletal:  ?   Cervical back: Normal range of motion.  ?Skin: ?   General: Skin is warm.  ?   Capillary Refill: Capillary refill takes less than 2 seconds.  ?Neurological:  ?   General: No focal deficit present.  ?   Mental Status: She is alert.  ?Psychiatric:     ?   Mood and Affect: Mood normal.  ? ? ?Vital signs in last 24 hours: ?Temp:  [97.6 ?F (36.4 ?C)] 97.6 ?F (36.4 ?C) (04/18 0544) ?Pulse Rate:  [82] 82 (04/18 0544) ?Resp:  [17] 17 (04/18 0544) ?BP: (137)/(81) 137/81 (04/18 0544) ?SpO2:  [98 %] 98 % (04/18 0544) ?Weight:  [93.9 kg] 93.9 kg (04/18 0551) ? ?Labs: ? ? ?Estimated body mass index is 39.11 kg/m? as calculated from the following: ?  Height as of this encounter: 5\' 1"  (1.549 m). ?  Weight as of this encounter: 93.9  kg. ? ? ?Imaging Review ?Plain radiographs demonstrate severe degenerative joint disease of the left knee(s). The overall alignment ismild varus. The bone quality appears to be good for age and reported activity level. ? ? ? ? ? ?Assessment/Plan: ? ?End stage arthritis, left knee  ? ?The patient history, physical examination, clinical judgment of the provider and imaging studies are consistent with end stage degenerative joint disease of the left knee(s) and total knee arthroplasty is deemed medically necessary. The treatment options including medical management, injection therapy arthroscopy and arthroplasty were discussed at length. The risks and benefits of total knee arthroplasty were presented and reviewed. The risks due to aseptic loosening, infection, stiffness, patella  tracking problems, thromboembolic complications and other imponderables were discussed. The patient acknowledged the explanation, agreed to proceed with the plan and consent was signed. Patient is being admitted for inpatient treatment for surgery, pain control, PT, OT, prophylactic antibiotics, VTE prophylaxis, progressive ambulation and ADL's and discharge planning. The patient is planning to be discharged home with home health services ? ?Knee replacement is the best option for predictable pain relief given her significant medial compartment OA.  There is also some cartilage wear on the undersurface of the patella.  Discussed the risks and benefits of the procedure including the risk of nerve/vessel damage, knee stiffness, knee instability, medical complication from surgery such as DVT/PE, periprosthetic joint infection, need for revision surgery.  Discussed the recovery timeframe and the need for intensive rehabilitation.  After lengthy discussion of options and of knee replacement recovery, patient would like to proceed with knee replacement. ? ? ?Patient's anticipated LOS is less than 2 midnights, meeting these requirements: ?- Younger  than 65 ?- Lives within 1 hour of care ?- Has a competent adult at home to recover with post-op recover ?- NO history of ? - Chronic pain requiring opiods ? - Diabetes ? - Coronary Artery Disease ? - Heart fail

## 2022-03-14 NOTE — Brief Op Note (Signed)
? ?  03/14/2022 ? ?11:40 AM ? ?PATIENT:  Stephanie Chandler  56 y.o. female ? ?PRE-OPERATIVE DIAGNOSIS:  left knee osteoarthritis ? ?POST-OPERATIVE DIAGNOSIS:  left knee osteoarthritis ? ?PROCEDURE:  Procedure(s): ?LEFT TOTAL KNEE ARTHROPLASTY ? ?SURGEON:  Surgeon(s): ?Cammy Copa, MD ? ?ASSISTANT: magnant pa ? ?ANESTHESIA:   spinal ? ?EBL: 150 ml   ? ?Total I/O ?In: 1600 [I.V.:1500; IV Piggyback:100] ?Out: 250 [Urine:100; Blood:150] ? ?BLOOD ADMINISTERED: none ? ?DRAINS: none  ? ?LOCAL MEDICATIONS USED: Marcaine morphine clonidine Exparel vancomycin powder ?SPECIMEN:  No Specimen ? ?COUNTS:  YES ? ?TOURNIQUET:   ?Total Tourniquet Time Documented: ?Thigh (Left) - 91 minutes ?Total: Thigh (Left) - 91 minutes ? ? ?DICTATION: .Other Dictation: Dictation Number done ? ?PLAN OF CARE: Admit for overnight observation ? ?PATIENT DISPOSITION:  PACU - hemodynamically stable ? ? ? ? ? ? ? ? ? ? ? ? ?  ?

## 2022-03-14 NOTE — Transfer of Care (Signed)
Immediate Anesthesia Transfer of Care Note ? ?Patient: Stephanie Chandler ? ?Procedure(s) Performed: LEFT TOTAL KNEE ARTHROPLASTY (Left: Knee) ? ?Patient Location: PACU ? ?Anesthesia Type:Spinal and MAC combined with regional for post-op pain ? ?Level of Consciousness: drowsy and patient cooperative ? ?Airway & Oxygen Therapy: Patient Spontanous Breathing and Patient connected to face mask oxygen ? ?Post-op Assessment: Report given to RN and Post -op Vital signs reviewed and stable ? ?Post vital signs: Reviewed and stable ? ?Last Vitals:  ?Vitals Value Taken Time  ?BP 117/63 03/14/22 1147  ?Temp    ?Pulse 77 03/14/22 1148  ?Resp 21 03/14/22 1148  ?SpO2 100 % 03/14/22 1148  ?Vitals shown include unvalidated device data. ? ?Last Pain:  ?Vitals:  ? 03/14/22 0613  ?TempSrc:   ?PainSc: 4   ?   ? ?  ? ?Complications: No notable events documented. ?

## 2022-03-14 NOTE — Progress Notes (Signed)
Orthopedic Tech Progress Note ?Patient Details:  ?Stephanie Chandler ?Apr 24, 1966 ?[id number] ? ?CPM Left Knee ?Left Knee Flexion (Degrees): 10 ?Left Knee Extension (Degrees): 40 ? ?  ? ?Alphonzo Devera A Gwendalyn Mcgonagle ?03/14/2022, 4:37 PM ? ?

## 2022-03-14 NOTE — Progress Notes (Signed)
Received patient from PACU via bed.  Patient is alert and oriented x 4.  LLE with ace wrap dressing, toes mobile with good capillary refill.  CPM in place, pt requested to be taken off.  Per PACU RN, ok to take off CPM now.  Ice man in place, ice replenished.  Needs addressed, assisted in comfortable position.  Call bell within reach.   ?

## 2022-03-14 NOTE — Anesthesia Procedure Notes (Signed)
Procedure Name: Morgan ?Date/Time: 03/14/2022 7:25 AM ?Performed by: Michele Rockers, CRNA ?Pre-anesthesia Checklist: Patient identified, Emergency Drugs available, Suction available, Timeout performed and Patient being monitored ?Patient Re-evaluated:Patient Re-evaluated prior to induction ?Oxygen Delivery Method: Simple face mask ? ? ? ? ?

## 2022-03-14 NOTE — Op Note (Signed)
NAME: Stephanie Chandler, REVELES ?MEDICAL RECORD NO: [id number] ?ACCOUNT NO: [id number] ?DATE OF BIRTH: January 07, 1966 ?FACILITY: [facility] ?LOCATION: [facility] ?PHYSICIAN: Graylin Shiver. August Saucer, MD ? ?Operative Report  ? ?DATE OF PROCEDURE: 03/14/2022 ? ?PREOPERATIVE DIAGNOSIS:  Left knee arthritis. ? ?POSTOPERATIVE DIAGNOSIS:  Left knee arthritis. ? ?PROCEDURE:  Left total knee replacement using Stryker press-fit size 2 tibia, 1 femur, 11 mm deep dish polyethylene insert, 29 mm press-fit 3-PEG patella with intraoperative fixation of nondisplaced lateral tibial plateau fracture. ? ?SURGEON:  Graylin Shiver. August Saucer, MD ? ?ASSISTANT:  Karenann Cai. ? ?INDICATIONS:  The patient is a 56 year old patient with left knee pain.  She has significant medial compartment arthritis and some patellofemoral compartment arthritis, who presents for operative management after explanation of risks and benefits. ? ?DESCRIPTION OF PROCEDURE:  The patient was brought to the operating room where spinal anesthetic was induced.  Preoperative antibiotics administered.  Timeout was called.  Left leg was pre-scrubbed with alcohol and Betadine, allowed to air dry, Prepped  ?DuraPrep solution and draped in a sterile manner.  Ioban used to cover the operative field.  Timeout was called.  The patient had about 5-degree flexion contracture to begin with. Left leg was elevated and exsanguinated with the Esmarch wrap.  Tourniquet ? was inflated.  Anterior approach to the knee was made.  Skin and subcutaneous tissue were sharply divided.  IrriSept solution utilized at this time.  Median parapatellar approach was made and marked with #1 Vicryl suture.  Patella was everted.  Minimal  ?medial soft tissue dissection was performed.  Fat pad was partially excised.  Soft tissue removed from the anterior distal femur.  Lateral patellofemoral ligament was released.  At this time, Patella was everted, knee was flexed.  The patient did have  ?some wear on the undersurface of the patella with  significant wear in the medial compartment.  The ACL was released, intramedullary alignment was then used to make a cut on the tibia, perpendicular to the mechanical axis 9 mm off the least affected  ?lateral tibial plateau.  Collaterals and posterior neurovascular structures were protected.  Cut was made.  Then, the femur was cut 8 mm initially.  After those cuts were made the extension gap was not big enough to accept a 9 mm spacer.  2 more  ?millimeters cut off the tibia, 2 more millimeters cut off the femur.  Bone quality was exceptionally hard.  Next, the femur was sized to a size 1.  The tibia sized to a size 2.  At this time, the tibial baseplate was placed.  Correct rotation confirmed.  ? With the femur was placed, trialing was then performed with both the 9 mm spacer and 11 mm spacer.  Patella was then cut down from 24 to 14 mm and a 3-PEG trial patella was placed.  With trial components in position the patient had full extension with  ?11 mm spacer.  PCL was released a little bit off the tibia and there were no liftoff of the tibial polyethylene.  The trial component was removed from the femur and the patella.  Keel punch was performed on the tibia.  Bone quality was exceptional hard  ?and the keel punch was actually difficult to remove. At this time, thorough irrigation was performed.  Capsule anesthetized using Marcaine, saline and Exparel.  IrriSept solution allowed to sit for 3 minutes along with TXA sponge.  Next, this was  ?removed.  Vancomycin powder placed within the tibial plateau.  The true implant was  then placed.  Small crack was noted anterolateral.  This was dissected and it was about 1 mm separation, but no vertical displacement.  Fluoroscopy was utilized and it  ?did show a nondisplaced lateral tibial plateau fracture.  At this time, tourniquet was released.  Dissection performed on the lateral aspect of the patellar tendon.  The fascia was then cut in an L-shaped fashion and subperiosteal  elevation was performed ? on the tibial plateau laterally.  Could not really visualize the fracture, but with examination under fluoroscopy that line was visible.  A Biomet 3.5  plate was bent and placed and used as a buttress and secured fixation was achieved with 3 screws distally and 1 ? nonlocking screw in the fragment.  That nonlocking screw did not touch the other tibia.  At this time, the femur was then placed and the polyethylene was placed and the patella placed.  The patient had full extension, excellent flexion with no liftoff,  ?and excellent patellar tracking using no thumbs technique.  Thorough irrigation with 6 liters of irrigating solution performed, IrriSept solution also utilized.  The fascia over the proximal lateral tibial plateau was then reapproximated using #1 Vicryl  ?suture.  Next, arthrotomy was closed using #1 Vicryl suture, followed by irrigation with IrriSept solution.  Vancomycin powder placed prior to final closure.  Then, a solution of Marcaine, morphine, clonidine injected into the knee for postoperative pain ? relief.  The layered closure was then performed with 0 Vicryl suture, 2-0 Vicryl suture, and 3-0 Monocryl with Steri-Strips and Aquacel dressing applied.  Alignment looked good.  Fixation stable.  The patient tolerated the procedure well without  ?immediate complications, transferred to the recovery room in stable condition.  Luke's assistance was required at all times for retraction, opening, closing, mobilization of tissue, drilling.  His assistance was a medical necessity. ? ? ?PUS ?D: 03/14/2022 11:48:19 am T: 03/14/2022 12:32:00 pm  ?JOB: [id number]/ [id number]  ?

## 2022-03-15 ENCOUNTER — Encounter: Payer: Self-pay | Admitting: Registered Nurse

## 2022-03-15 ENCOUNTER — Other Ambulatory Visit: Payer: Self-pay | Admitting: Surgical

## 2022-03-15 ENCOUNTER — Telehealth: Payer: Self-pay | Admitting: Registered Nurse

## 2022-03-15 DIAGNOSIS — Z96652 Presence of left artificial knee joint: Secondary | ICD-10-CM

## 2022-03-15 DIAGNOSIS — G5782 Other specified mononeuropathies of left lower limb: Secondary | ICD-10-CM

## 2022-03-15 DIAGNOSIS — M1712 Unilateral primary osteoarthritis, left knee: Secondary | ICD-10-CM

## 2022-03-15 LAB — CBC
HCT: 34.5 % — ABNORMAL LOW (ref 36.0–46.0)
Hemoglobin: 12.1 g/dL (ref 12.0–15.0)
MCH: 31.1 pg (ref 26.0–34.0)
MCHC: 35.1 g/dL (ref 30.0–36.0)
MCV: 88.7 fL (ref 80.0–100.0)
Platelets: 270 10*3/uL (ref 150–400)
RBC: 3.89 MIL/uL (ref 3.87–5.11)
RDW: 12.3 % (ref 11.5–15.5)
WBC: 12.3 10*3/uL — ABNORMAL HIGH (ref 4.0–10.5)
nRBC: 0 % (ref 0.0–0.2)

## 2022-03-15 LAB — VITAMIN D 25 HYDROXY (VIT D DEFICIENCY, FRACTURES): Vit D, 25-Hydroxy: 40.77 ng/mL (ref 30–100)

## 2022-03-15 LAB — GLUCOSE, CAPILLARY
Glucose-Capillary: 181 mg/dL — ABNORMAL HIGH (ref 70–99)
Glucose-Capillary: 142 mg/dL — ABNORMAL HIGH (ref 70–99)

## 2022-03-15 MED ORDER — CELECOXIB 100 MG PO CAPS: 100.0000 mg | ORAL_CAPSULE | Freq: Two times a day (BID) | ORAL | 0 refills | Status: DC

## 2022-03-15 MED ORDER — METHOCARBAMOL 500 MG PO TABS: 500.0000 mg | ORAL_TABLET | Freq: Three times a day (TID) | ORAL | 0 refills | Status: DC | PRN

## 2022-03-15 MED ORDER — ASPIRIN 81 MG PO CHEW: 81.0000 mg | CHEWABLE_TABLET | Freq: Two times a day (BID) | ORAL | 0 refills | Status: DC

## 2022-03-15 MED ORDER — OXYCODONE HCL 5 MG PO TABS: 5.0000 mg | ORAL_TABLET | ORAL | 0 refills | Status: DC | PRN

## 2022-03-15 NOTE — Progress Notes (Signed)
Physical Therapy Treatment ?Patient Details ?Name: Stephanie Chandler ?MRN: [id number] ?DOB: February 23, 1966 ?Today's Date: 03/15/2022 ? ? ?History of Present Illness Pt is a 56 y.o. female admitted 03/14/22 for elective L TKA. PMH includes OA, DM, asthma, OSA. ?  ?PT Comments  ? ? Pt progressing with mobility. Pt tolerated transfer, gait and stair training with intermittent min guard for balance; daughter present for observation and education. Pt remains hopeful for d/c this afternoon; reviewed education and questions answered; pt and daughter report no further questions or concerns. If to remain admitted, will continue to follow acutely. ?   ?Recommendations for follow up therapy are one component of a multi-disciplinary discharge planning process, led by the attending physician.  Recommendations may be updated based on patient status, additional functional criteria and insurance authorization. ? ?Follow Up Recommendations ? Follow physician's recommendations for discharge plan and follow up therapies ?  ?  ?Assistance Recommended at Discharge Intermittent Supervision/Assistance  ?Patient can return home with the following A little help with bathing/dressing/bathroom;Assistance with cooking/housework;Assist for transportation ?  ?Equipment Recommendations ? None recommended by PT  ?  ?Recommendations for Other Services   ? ? ?  ?Precautions / Restrictions Precautions ?Precautions: Knee;Fall ?Restrictions ?Weight Bearing Restrictions: Yes ?LLE Weight Bearing: Weight bearing as tolerated  ?  ? ?Mobility ? Bed Mobility ?Overal bed mobility: Modified Independent ?  ?  ?  ?  ?  ?  ?General bed mobility comments: received sitting in recliner ?  ? ?Transfers ?Overall transfer level: Modified independent ?Equipment used: Rolling walker (2 wheels) ?Transfers: Sit to/from Stand ?Sit to Stand: Supervision ?  ?  ?  ?  ?  ?General transfer comment: multiple sit<>stands from EOB and BSC (at sink) to RW with initial min guard, progressing to  supervision ?  ? ?Ambulation/Gait ?Ambulation/Gait assistance: Supervision ?Gait Distance (Feet): 280 Feet ?Assistive device: Rolling walker (2 wheels) ?Gait Pattern/deviations: Step-through pattern, Decreased stride length, Decreased weight shift to left, Antalgic, Trunk flexed ?  ?  ?  ?General Gait Details: Antalgic gait with RW and supervision for safety; cues for sequencing with RW to allow more fluid gait pattern with step-through and heel-to-toe ? ? ?Stairs ?Stairs: Yes ?Stairs assistance: Min guard ?Stair Management: Step to pattern, Forwards, One rail Left ?Number of Stairs: 3 ?General stair comments: ascend/descend 3 steps with single rail support, cues for sequencing and safety; also demonstrated technique to ascend single platform step backwards with RW, descend forwards with RW; daughter present to observe, educ on correct guarding ? ? ?Wheelchair Mobility ?  ? ?Modified Rankin (Stroke Patients Only) ?  ? ? ?  ?Balance Overall balance assessment: Needs assistance ?  ?Sitting balance-Leahy Scale: Good ?  ?  ?  ?Standing balance-Leahy Scale: Fair ?Standing balance comment: can static stand at sink for ADLs (bathing, brush teeth) with intermittent no UE or single UE support ?  ?  ?  ?  ?  ?  ?  ?  ?  ?  ?  ?  ? ?  ?Cognition Arousal/Alertness: Awake/alert ?Behavior During Therapy: [facility] for tasks assessed/performed ?Overall Cognitive Status: Within Functional Limits for tasks assessed ?  ?  ?  ?  ?  ?  ?  ?  ?  ?  ?  ?  ?  ?  ?  ?  ?General Comments: requires some cues to slow down ?  ?  ? ?  ?Exercises Total Joint Exercises ?Ankle Circles/Pumps: AROM, Left, Seated ?Quad Sets: AROM, Left, Seated ?Heel  Slides: AAROM, Left, Seated ?Long Arc Quad: AROM, Left, Seated ?Other Exercises ?Other Exercises: [employer] HEP handout (Access Code [id number]) & gait belt provided - pt endorses understanding ? ?  ?General Comments General comments (skin integrity, edema, etc.): pt's daughter present and supportive. reviewed  education, including HEP. pt left resting with L knee extended in blue bone foam, seated in recliner ?  ?  ? ?Pertinent Vitals/Pain Pain Assessment ?Pain Assessment: Faces ?Faces Pain Scale: Hurts little more ?Pain Location: L knee ?Pain Descriptors / Indicators: Discomfort, Guarding, Sore ?Pain Intervention(s): Monitored during session, Limited activity within patient's tolerance  ? ? ?Home Living   ?  ?  ?  ?  ?  ?  ?  ?  ?  ?   ?  ?Prior Function    ?  ?  ?   ? ?PT Goals (current goals can now be found in the care plan section) Progress towards PT goals: Progressing toward goals ? ?  ?Frequency ? ? ? 7X/week ? ? ? ?  ?PT Plan    ? ? ?Co-evaluation   ?  ?  ?  ?  ? ?  ?AM-PAC PT "6 Clicks" Mobility   ?Outcome Measure ? Help needed turning from your back to your side while in a flat bed without using bedrails?: None ?Help needed moving from lying on your back to sitting on the side of a flat bed without using bedrails?: None ?Help needed moving to and from a bed to a chair (including a wheelchair)?: None ?Help needed standing up from a chair using your arms (e.g., wheelchair or bedside chair)?: None ?Help needed to walk in hospital room?: A Little ?Help needed climbing 3-5 steps with a railing? : A Little ?6 Click Score: 22 ? ?  ?End of Session Equipment Utilized During Treatment: Gait belt ?Activity Tolerance: Patient tolerated treatment well ?Patient left: in chair;with call bell/phone within reach;with chair alarm set;with nursing/sitter in room ?Nurse Communication: Mobility status ?PT Visit Diagnosis: Other abnormalities of gait and mobility (R26.89);Pain ?Pain - Right/Left: Left ?Pain - part of body: Knee ?  ? ? ?Time: 0932-3557 ?PT Time Calculation (min) (ACUTE ONLY): 22 min ? ?Charges:  $Gait Training: 8-22 mins          ?          ? ?Ina Homes, PT, DPT ?Acute Rehabilitation Services  ?Pager [phone] ?Office [phone] ? ?Malachy Chamber ?03/15/2022, 1:44 PM ? ?

## 2022-03-15 NOTE — Progress Notes (Signed)
?  Subjective: ?FILIPPA YARBOUGH is a 56 y.o. female s/p left TKA.  They are POD 1.  Pt's pain is controlled.  Pt denies numbness/tingling/weakness.  Pt has ambulated with some difficulty.   Denies any chest pain or shortness of breath.  No abdominal pain.  No lightheadedness or dizziness. ? ?Objective: ?Vital signs in last 24 hours: ?  ? ?Intake/Output from previous day: ?No intake/output data recorded. ?Intake/Output this shift: ?No intake/output data recorded. ? ?Exam: ? ?No gross blood or drainage overlying the dressing ?2+ DP pulse ?Sensation intact distally in the left foot ?Able to dorsiflex and plantarflex the left foot ?Patient able to perform straight leg raise.  No calf tenderness.  Negative Homans' sign. ? ? ?Labs: ?No results for input(s): HGB in the last 72 hours. ?No results for input(s): WBC, RBC, HCT, PLT in the last 72 hours. ?No results for input(s): NA, K, CL, CO2, BUN, CREATININE, GLUCOSE, CALCIUM in the last 72 hours. ?No results for input(s): LABPT, INR in the last 72 hours. ? ?Assessment/Plan: ?Pt is POD 1 s/p left TKA.   ? -Plan to discharge to home today or tomorrow pending patient's pain and PT eval ? -WBAT with a walker  ? ? ?Joycie Peek Bane Hagy ?03/23/2022, 7:35 AM  ? ? ?   ?

## 2022-03-15 NOTE — Plan of Care (Signed)
Adequate for discharge.

## 2022-03-15 NOTE — Telephone Encounter (Signed)
Patient reported discharged to home today and therapist coming to the house tomorrow.  Next orthopedics follow up appt in 2 weeks.  Denied needs or concerns at this time.  Patient A&Ox3 spoke full sentences without difficulty. ?

## 2022-03-15 NOTE — Evaluation (Signed)
Physical Therapy Evaluation ?Patient Details ?Name: Stephanie Chandler ?MRN: [id number] ?DOB: Dec 02, 1965 ?Today's Date: 03/15/2022 ? ?History of Present Illness ? Pt is a 56 y.o. female admitted 03/14/22 for elective L TKA. PMH includes OA, DM, asthma, OSA.  ?Clinical Impression ? Pt presents with an overall decrease in functional mobility secondary to above. PTA, pt independent without DME, working, lives with daughter. Initiated educ re: precautions, positioning, therex/ROM, activity recommendations. Today, pt able to initiate transfer and gait training with RW; performing standing ADL tasks near-mod indep level; pt moving extremely well for POD#1 and is hopeful for d/c home later today. Will follow acutely to address established goals.    ? ?Recommendations for follow up therapy are one component of a multi-disciplinary discharge planning process, led by the attending physician.  Recommendations may be updated based on patient status, additional functional criteria and insurance authorization. ? ?Follow Up Recommendations Follow physician's recommendations for discharge plan and follow up therapies ? ?  ?Assistance Recommended at Discharge Intermittent Supervision/Assistance  ?Patient can return home with the following ? A little help with bathing/dressing/bathroom;Assistance with cooking/housework;Assist for transportation ? ?  ?Equipment Recommendations None recommended by PT  ?Recommendations for Other Services ?    ?  ?Functional Status Assessment Patient has had a recent decline in their functional status and demonstrates the ability to make significant improvements in function in a reasonable and predictable amount of time.  ? ?  ?Precautions / Restrictions Precautions ?Precautions: Knee;Fall ?Restrictions ?Weight Bearing Restrictions: Yes ?LLE Weight Bearing: Weight bearing as tolerated  ? ?  ? ?Mobility ? Bed Mobility ?Overal bed mobility: Modified Independent ?  ?  ?  ?  ?  ?  ?General bed mobility comments: good  ability to manage LLE to EOB ?  ? ?Transfers ?Overall transfer level: Needs assistance ?Equipment used: Rolling walker (2 wheels) ?Transfers: Sit to/from Stand ?Sit to Stand: Supervision ?  ?  ?  ?  ?  ?General transfer comment: multiple sit<>stands from EOB and BSC (at sink) to RW with initial min guard, progressing to supervision ?  ? ?Ambulation/Gait ?Ambulation/Gait assistance: Min guard ?Gait Distance (Feet): 24 Feet ?Assistive device: Rolling walker (2 wheels) ?Gait Pattern/deviations: Step-through pattern, Decreased stride length, Decreased weight shift to left, Antalgic, Trunk flexed ?  ?  ?  ?General Gait Details: Quick, antalgic gait with RW and intermittent min guard for balance; cues to for increased LLE WBAT and heel-to-toe gait pattern. Further distance deferred as session focused on ADL tasks ? ?Stairs ?  ?  ?  ?  ?  ? ?Wheelchair Mobility ?  ? ?Modified Rankin (Stroke Patients Only) ?  ? ?  ? ?Balance Overall balance assessment: Needs assistance ?  ?Sitting balance-Leahy Scale: Good ?  ?  ?  ?Standing balance-Leahy Scale: Fair ?Standing balance comment: can static stand at sink for ADLs (bathing, brush teeth) with intermittent no UE or single UE support ?  ?  ?  ?  ?  ?  ?  ?  ?  ?  ?  ?   ? ? ? ?Pertinent Vitals/Pain Pain Assessment ?Pain Assessment: Faces ?Faces Pain Scale: Hurts little more ?Pain Location: L knee ?Pain Descriptors / Indicators: Discomfort, Guarding, Sore ?Pain Intervention(s): Monitored during session, Limited activity within patient's tolerance  ? ? ?Home Living Family/patient expects to be discharged to:: Private residence ?Living Arrangements: Children ?Available Help at Discharge: Family;Available 24 hours/day ?Type of Home: House ?Home Access: Stairs to enter ?Entrance Stairs-Rails: Right;Left ?Entrance Stairs-Number of  Steps: 2 ?  ?Home Layout: One level ?Home Equipment: Agricultural consultant (2 wheels);Rollator (4 wheels);BSC/3in1 ?Additional Comments: Lives with daughter who  works; another daughter plans to stay with her a week at d/c to assist  ?  ?Prior Function Prior Level of Function : Independent/Modified Independent;Working/employed;Driving ?  ?  ?  ?  ?  ?  ?Mobility Comments: Typically indep without DME, works on her feet ?  ?  ? ? ?Hand Dominance  ?   ? ?  ?Extremity/Trunk Assessment  ? Upper Extremity Assessment ?Upper Extremity Assessment: Overall WFL for tasks assessed ?  ? ?Lower Extremity Assessment ?Lower Extremity Assessment: LLE deficits/detail ?LLE Deficits / Details: performing quad set, LAQ partial SLR, ankle pumps ?  ? ?Cervical / Trunk Assessment ?Cervical / Trunk Assessment: Normal  ?Communication  ? Communication: No difficulties  ?Cognition Arousal/Alertness: Awake/alert ?Behavior During Therapy: [facility] for tasks assessed/performed ?Overall Cognitive Status: Within Functional Limits for tasks assessed ?  ?  ?  ?  ?  ?  ?  ?  ?  ?  ?  ?  ?  ?  ?  ?  ?General Comments: requires some cues to slow down ?  ?  ? ?  ?General Comments General comments (skin integrity, edema, etc.): initiated post-op TKA educ re: precautions, positioning (resting in ext), AROM/therex, edema control. pt left resting with L knee extended in blue bone foam, seated in recliner ? ?  ?Exercises Total Joint Exercises ?Ankle Circles/Pumps: AROM, Left, Seated ?Quad Sets: AROM, Left, Seated ?Long Arc Quad: AROM, Left, Seated  ? ?Assessment/Plan  ?  ?PT Assessment Patient needs continued PT services  ?PT Problem List Decreased strength;Decreased range of motion;Decreased activity tolerance;Decreased balance;Decreased mobility;Decreased knowledge of use of DME;Decreased knowledge of precautions;Pain ? ?   ?  ?PT Treatment Interventions DME instruction;Gait training;Stair training;Functional mobility training;Therapeutic activities;Therapeutic exercise;Balance training;Patient/family education   ? ?PT Goals (Current goals can be found in the Care Plan section)  ?Acute Rehab PT Goals ?Patient Stated  Goal: home today ?PT Goal Formulation: With patient ?Time For Goal Achievement: 03/29/22 ?Potential to Achieve Goals: Good ? ?  ?Frequency 7X/week ?  ? ? ?Co-evaluation   ?  ?  ?  ?  ? ? ?  ?AM-PAC PT "6 Clicks" Mobility  ?Outcome Measure Help needed turning from your back to your side while in a flat bed without using bedrails?: None ?Help needed moving from lying on your back to sitting on the side of a flat bed without using bedrails?: None ?Help needed moving to and from a bed to a chair (including a wheelchair)?: A Little ?Help needed standing up from a chair using your arms (e.g., wheelchair or bedside chair)?: A Little ?Help needed to walk in hospital room?: A Little ?Help needed climbing 3-5 steps with a railing? : A Little ?6 Click Score: 20 ? ?  ?End of Session   ?Activity Tolerance: Patient tolerated treatment well ?Patient left: in chair;with call bell/phone within reach;with chair alarm set ?Nurse Communication: Mobility status ?PT Visit Diagnosis: Other abnormalities of gait and mobility (R26.89);Pain ?Pain - Right/Left: Left ?Pain - part of body: Knee ?  ? ?Time: 3646-8032 ?PT Time Calculation (min) (ACUTE ONLY): 32 min ? ? ?Charges:   PT Evaluation ?$PT Eval Low Complexity: 1 Low ?PT Treatments ?$Therapeutic Activity: 8-22 mins ?  ?   ? ?Ina Homes, PT, DPT ?Acute Rehabilitation Services  ?Pager [phone] ?Office [phone] ? ?Malachy Chamber ?03/15/2022, 8:55 AM ? ?

## 2022-03-15 NOTE — TOC Initial Note (Signed)
Transition of Care (TOC) - Initial/Assessment Note  ? ? ?Patient Details  ?Name: Stephanie Chandler ?MRN: [id number] ?Date of Birth: Sep 09, 1966 ? ?Transition of Care [facility]) CM/SW Contact:    ?Epifanio Lesches, RN ?Phone Number: ?03/15/2022, 1:59 PM ? ?Clinical Narrative:                 ?  -s/p L TKA ,4/18 ?  ? From home with family.PTA independent with ADL's , no DME usage. States daughter to provide the assistance and care once d/c.  Order noted for home health PT services. Pt agreeable to home health services. Choice offered. Pt without preference.Referral made with [facility], acceptance pending. ? Pt without DME needs, already with CPM, RW, BSC @ home. ? ?Pt without Rx med concerns. ?Daughter to provide transportation to home when d/c ready. ?Post hospital f/u noted on AVS. ? ?TOC team will continue to monitor and assist with needs. ? ? ?Patient Goals and CMS Choice ?  ?  ?  ? ?Expected Discharge Plan and Services ?  ?  ?Discharge Planning Services: CM Consult ?  ?Living arrangements for the past 2 months: Single Family Home ?Expected Discharge Date: 03/15/22               ?  ?  ?  ?  ?  ?HH Arranged: PT ?HH Agency: [facility] ?Date HH Agency Contacted: 03/15/22 ?Time HH Agency Contacted: [phone] ?Representative spoke with at [facility] Agency: [place] ? ?Prior Living Arrangements/Services ?Living arrangements for the past 2 months: Single Family Home ?Lives with:: Self ?Patient language and need for interpreter reviewed:: Yes ?Do you feel safe going back to the place where you live?: Yes      ?Need for Family Participation in Patient Care: Yes (Comment) ?Care giver support system in place?: Yes (comment) ?  ?Criminal Activity/Legal Involvement Pertinent to Current Situation/Hospitalization: No - Comment as needed ? ?Activities of Daily Living ?Home Assistive Devices/Equipment: Eyeglasses, CBG Meter, CPAP, Walker (specify type), Bedside commode/3-in-1 ?ADL Screening (condition at time of  admission) ?Patient's cognitive ability adequate to safely complete daily activities?: Yes ?Is the patient deaf or have difficulty hearing?: No ?Does the patient have difficulty seeing, even when wearing glasses/contacts?: No ?Does the patient have difficulty concentrating, remembering, or making decisions?: No ?Patient able to express need for assistance with ADLs?: Yes ?Does the patient have difficulty dressing or bathing?: No ?Independently performs ADLs?: Yes (appropriate for developmental age) ?Does the patient have difficulty walking or climbing stairs?: Yes ?Weakness of Legs: Both ?Weakness of Arms/Hands: None ? ?Permission Sought/Granted ?  ?Permission granted to share information with : Yes, Verbal Permission Granted ? Share Information with NAME: Lamonte Richer (Daughter) [phone] ?   ?   ?   ? ?Emotional Assessment ?Appearance:: Appears stated age ?Attitude/Demeanor/Rapport: Gracious ?Affect (typically observed): Accepting ?Orientation: : Oriented to Self, Oriented to Place, Oriented to  Time, Oriented to Situation ?  ?Psych Involvement: No (comment) ? ?Admission diagnosis:  S/P total knee arthroplasty, left [Z96.652] ?Patient Active Problem List  ? Diagnosis Date Noted  ? S/P total knee arthroplasty, left 03/14/2022  ? Vitamin D deficiency 02/07/2022  ? Adjustment disorder with depressed mood 02/07/2022  ? Hyperlipidemia, unspecified 12/12/2021  ? 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 Urge incontinence of urine 12/12/2021  ? Degeneration of lumbar intervertebral disc 12/12/2021  ? Halitosis 07/25/2021  ? Abdominal bloating 06/23/2021  ? Flatulence, eructation and gas pain 06/23/2021  ? Chronic  pain 11/04/2020  ? Exacerbation of asthma 11/04/2020  ? Hyperglycemia due to type 2 diabetes mellitus ([place]) 11/04/2020  ? Mild intermittent asthma 11/04/2020  ? Intractable hiccups 11/10/2019  ? COVID-19 virus infection 11/10/2019  ? Morbid obesity ([place]) 12/12/2018  ? Acute  pain of right shoulder 03/15/2017  ? Strain of right trapezius muscle 03/15/2017  ? Musculoskeletal pain 03/15/2017  ? OSA (obstructive sleep apnea) 08/23/2015  ? Sciatica 11/13/2012  ? ?PCP:  Caren Macadam, MD ?Pharmacy:   ?[facility], [place] ?[address] [place] ?[place] [address] ?Phone: [phone] Fax: [phone] ? ?[id number] [address], [place] ?[place] ?[place] [address] ?Phone: [phone] Fax: [phone] ? ?[facility], [place] [facility] ?[place] ?[place] [address] ?Phone: [phone] Fax: [phone] ? ? ? ? ?Social Determinants of Health (SDOH) Interventions ?  ? ?Readmission Risk Interventions ?   ? View : No data to display.  ?  ?  ?  ? ? ? ?

## 2022-03-16 ENCOUNTER — Encounter (HOSPITAL_COMMUNITY): Payer: Self-pay | Admitting: Orthopedic Surgery

## 2022-03-20 ENCOUNTER — Encounter: Payer: Self-pay | Admitting: Orthopedic Surgery

## 2022-03-20 ENCOUNTER — Encounter (HOSPITAL_COMMUNITY): Payer: Self-pay | Admitting: Orthopedic Surgery

## 2022-03-20 NOTE — Telephone Encounter (Signed)
I called and spoke with patient.  She is doing very well and having no significantly increased pain.  No fevers, chills, signs of infection.  I advised her to mark around the periphery of the current blood spots and if it increases anymore past today, we can fit her in for evaluation this week.  She will call us with any concerns in the meantime.

## 2022-03-21 NOTE — Telephone Encounter (Signed)
Patient contacted via telephone and stated PT today and feeling well.  Ambulating in the house with walker.  Swelling is down.  Orthopedics appt next week.  Patient denied questions or concerns at this time. ?

## 2022-03-23 NOTE — Discharge Summary (Signed)
Physician Discharge Summary  ? ? ? ? ?Patient ID: ?Stephanie Chandler ?MRN: [id number] ?DOB/AGE: 08-12-1966 56 y.o. ? ?Admit date: 03/14/2022 ?Discharge date: 03/15/2022 ? ?Admission Diagnoses:  ?Principal Problem: ?  S/P total knee arthroplasty, left ? ? ?Discharge Diagnoses:  ?Same ? ?Surgeries: Procedure(s): ?LEFT TOTAL KNEE ARTHROPLASTY on 03/14/2022 ?  ?Consultants:  ? ?Discharged Condition: Stable ? ?Hospital Course: Stephanie Chandler is an 56 y.o. female who was admitted 03/14/2022 with a chief complaint of left knee pain, and found to have a diagnosis of left knee osteoarthritis.  They were brought to the operating room on 03/14/2022 and underwent the above named procedures.  Pt awoke from anesthesia without complication and was transferred to the floor. On POD1, patient's pain was well controlled.  She was able to ambulate without difficulty.  No complaint of chest pain, shortness of breath, abdominal pain, dizziness, lightheadedness.  After performing well with physical therapy, she was discharged home on POD 1..  Pt will f/u with Dr. Marlou Sa in clinic in ~2 weeks.  ? ?Antibiotics given:  ?Anti-infectives (From admission, onward)  ? ? Start     Dose/Rate Route Frequency Ordered Stop  ? 03/14/22 2200  ceFAZolin (ANCEF) IVPB 2g/100 mL premix       ? 2 g ?200 mL/hr over 30 Minutes Intravenous Every 8 hours 03/14/22 1742 03/15/22 0928  ? 03/14/22 0819  vancomycin (VANCOCIN) powder  Status:  Discontinued       ?   As needed 03/14/22 0819 03/14/22 1138  ? 03/14/22 0600  ceFAZolin (ANCEF) IVPB 2g/100 mL premix       ? 2 g ?200 mL/hr over 30 Minutes Intravenous On call to O.R. 03/14/22 0544 03/14/22 1125  ? ?  ?. ? ?Recent vital signs:  ?Vitals:  ? 03/15/22 0512 03/15/22 0733  ?BP: 122/71 (!) 144/81  ?Pulse: 89 89  ?Resp: 17 16  ?Temp: 98.2 ?F (36.8 ?C) 97.9 ?F (36.6 ?C)  ?SpO2: 98% 99%  ? ? ?Recent laboratory studies:  ?Results for orders placed or performed during the hospital encounter of 03/14/22  ?Glucose, capillary  ?Result  Value Ref Range  ? Glucose-Capillary 114 (H) 70 - 99 mg/dL  ?VITAMIN D 25 Hydroxy (Vit-D Deficiency, Fractures)  ?Result Value Ref Range  ? Vit D, 25-Hydroxy 40.77 30 - 100 ng/mL  ?Glucose, capillary  ?Result Value Ref Range  ? Glucose-Capillary 130 (H) 70 - 99 mg/dL  ? Comment 1 Notify RN   ? Comment 2 Document in Chart   ?CBC  ?Result Value Ref Range  ? WBC 12.3 (H) 4.0 - 10.5 K/uL  ? RBC 3.89 3.87 - 5.11 MIL/uL  ? Hemoglobin 12.1 12.0 - 15.0 g/dL  ? HCT 34.5 (L) 36.0 - 46.0 %  ? MCV 88.7 80.0 - 100.0 fL  ? MCH 31.1 26.0 - 34.0 pg  ? MCHC 35.1 30.0 - 36.0 g/dL  ? RDW 12.3 11.5 - 15.5 %  ? Platelets 270 150 - 400 K/uL  ? nRBC 0.0 0.0 - 0.2 %  ?Glucose, capillary  ?Result Value Ref Range  ? Glucose-Capillary 168 (H) 70 - 99 mg/dL  ?Glucose, capillary  ?Result Value Ref Range  ? Glucose-Capillary 220 (H) 70 - 99 mg/dL  ?Glucose, capillary  ?Result Value Ref Range  ? Glucose-Capillary 205 (H) 70 - 99 mg/dL  ?Glucose, capillary  ?Result Value Ref Range  ? Glucose-Capillary 181 (H) 70 - 99 mg/dL  ?Glucose, capillary  ?Result Value Ref Range  ? Glucose-Capillary 142 (H)  70 - 99 mg/dL  ? ? ?Discharge Medications:   ?Allergies as of 03/15/2022   ? ?   Reactions  ? Flagyl [metronidazole] Hives  ? ?  ? ?  ?Medication List  ?  ? ?STOP taking these medications   ? ?traMADol 50 MG tablet ?Commonly known as: Ultram ?  ? ?  ? ?TAKE these medications   ? ?albuterol 108 (90 Base) MCG/ACT inhaler ?Commonly known as: VENTOLIN HFA ?Inhale 1-2 puffs into the lungs every 4 (four) hours as needed for wheezing or shortness of breath. ?  ?celecoxib 100 MG capsule ?Commonly known as: CELEBREX ?Take 1 capsule (100 mg total) by mouth 2 (two) times daily. ?  ?Cholecalciferol 1.25 MG (50000 UT) Tabs ?Take 1 tablet by mouth once a week for 12 doses. ?  ?diclofenac Sodium 1 % Gel ?Commonly known as: VOLTAREN ?Apply 2 g topically in the morning and at bedtime. ?  ?fluticasone 50 MCG/ACT nasal spray ?Commonly known as: FLONASE ?Place 1 spray into  both nostrils 2 (two) times daily. ?  ?loratadine 10 MG tablet ?Commonly known as: CLARITIN ?Take 1 tablet (10 mg total) by mouth daily. ?  ?methocarbamol 500 MG tablet ?Commonly known as: ROBAXIN ?Take 1 tablet (500 mg total) by mouth every 8 (eight) hours as needed for muscle spasms. ?  ?omeprazole 20 MG capsule ?Commonly known as: PRILOSEC ?Take 1 capsule (20 mg total) by mouth daily. ?What changed:  ?when to take this ?reasons to take this ?  ?oxyCODONE 5 MG immediate release tablet ?Commonly known as: Oxy IR/ROXICODONE ?Take 1 tablet (5 mg total) by mouth every 4 (four) hours as needed for moderate pain (pain score 4-6). ?  ?Ozempic (0.25 or 0.5 MG/DOSE) 2 MG/3ML Sopn ?Generic drug: Semaglutide(0.25 or 0.5MG /DOS) ?Inject 0.25 mg into the skin every Saturday. ?  ?TURMERIC PO ?Take 1 capsule by mouth 2 (two) times daily as needed (inflammation). ?  ? ?  ? ? ?Diagnostic Studies: DG Knee 1-2 Views Left ? ?Result Date: 03/14/2022 ?CLINICAL DATA:  Postop from left knee arthroplasty. EXAM: LEFT KNEE - 2 VIEW COMPARISON:  None. FINDINGS: Tricompartmental left knee prosthesis is seen with all 3 components in expected position. Fixation plate and screws are also seen within the proximal tibial metaphysis. No evidence of fracture or dislocation. IMPRESSION: Expected postop appearance of left knee prosthesis. No acute findings. Electronically Signed   By: Marlaine Hind M.D.   On: 03/14/2022 12:38  ? ?DG Knee 1-2 Views Left ? ?Result Date: 03/14/2022 ?CLINICAL DATA:  Total knee arthroplasty EXAM: LEFT KNEE - 1-2 VIEW COMPARISON:  MRI 12/23/2021 FINDINGS: Two intraoperative images demonstrate total knee arthroplasty and medial proximal tibial plate and screw fixation. IMPRESSION: Intraoperative imaging. Electronically Signed   By: Abigail Miyamoto M.D.   On: 03/14/2022 11:10  ? ?DG C-Arm 1-60 Min-No Report ? ?Result Date: 03/14/2022 ?Fluoroscopy was utilized by the requesting physician.  No radiographic interpretation.  ? ?DG C-Arm  1-60 Min-No Report ? ?Result Date: 03/14/2022 ?Fluoroscopy was utilized by the requesting physician.  No radiographic interpretation.   ? ?Disposition: Discharge disposition: 01-Home or Self Care ? ? ? ? ? ? ?Discharge Instructions   ? ? Call MD / Call 911   Complete by: As directed ?  ? If you experience chest pain or shortness of breath, CALL 911 and be transported to the hospital emergency room.  If you develope a fever above 101 F, pus (white drainage) or increased drainage or redness at the wound,  or calf pain, call your surgeon's office.  ? Constipation Prevention   Complete by: As directed ?  ? Drink plenty of fluids.  Prune juice may be helpful.  You may use a stool softener, such as Colace (over the counter) 100 mg twice a day.  Use MiraLax (over the counter) for constipation as needed.  ? Diet - low sodium heart healthy   Complete by: As directed ?  ? Discharge instructions   Complete by: As directed ?  ? You may shower, dressing is waterproof.  Do not remove the dressing, we will remove it at your first post-op appointment.  Do not take a bath or soak the knee in a tub or pool.  You may weightbear as you can tolerate on the operative leg with a walker.  Continue using the CPM machine 3 times per day for one hour each time, increasing the degrees of range of motion daily.  Use the blue cradle boot under your heel to work on getting your leg straight.  Do NOT put a pillow under your knee.  You will follow-up with Dr. Marlou Sa in the clinic in 2 weeks at your given appointment date.   ? ?INSTRUCTIONS AFTER JOINT REPLACEMENT  ? ?Remove items at home which could result in a fall. This includes throw rugs or furniture in walking pathways ?ICE to the affected joint every three hours while awake for 30 minutes at a time, for at least the first 3-5 days, and then as needed for pain and swelling.  Continue to use ice for pain and swelling. You may notice swelling that will progress down to the foot and ankle.  This is  normal after surgery.  Elevate your leg when you are not up walking on it.   ?Continue to use the breathing machine you got in the hospital (incentive spirometer) which will help keep your temperature down

## 2022-03-26 ENCOUNTER — Emergency Department (HOSPITAL_COMMUNITY)
Admission: EM | Admit: 2022-03-26 | Discharge: 2022-03-27 | Disposition: A | Discharge status: Home / Self Care | Payer: No Typology Code available for payment source | Attending: Emergency Medicine | Admitting: Emergency Medicine

## 2022-03-26 ENCOUNTER — Encounter (HOSPITAL_COMMUNITY): Payer: Self-pay | Admitting: Emergency Medicine

## 2022-03-26 ENCOUNTER — Encounter: Payer: Self-pay | Admitting: Registered Nurse

## 2022-03-26 ENCOUNTER — Other Ambulatory Visit: Payer: Self-pay

## 2022-03-26 DIAGNOSIS — K5900 Constipation, unspecified: Secondary | ICD-10-CM | POA: Diagnosis not present

## 2022-03-26 DIAGNOSIS — Z5321 Procedure and treatment not carried out due to patient leaving prior to being seen by health care provider: Secondary | ICD-10-CM | POA: Diagnosis not present

## 2022-03-26 DIAGNOSIS — M1712 Unilateral primary osteoarthritis, left knee: Secondary | ICD-10-CM

## 2022-03-26 NOTE — ED Triage Notes (Signed)
Pt reports constipation d/t pain meds following knee replacement surgery, has been taking laxative, prune juice, mag citrate and 2 suppositories; has had BM since arrival but would like RX for constipation   ?

## 2022-03-27 MED ORDER — DOCUSATE SODIUM 100 MG PO CAPS: 100.0000 mg | ORAL_CAPSULE | Freq: Two times a day (BID) | ORAL | 0 refills | Status: DC

## 2022-03-27 NOTE — ED Notes (Signed)
Pt called for room x3 with no response 

## 2022-03-27 NOTE — Telephone Encounter (Signed)
Stephanie Chandler, Colace has been sent to [employer] at [employer] and [place]. Please see below for the handouts [name] mentioned. Hope you get to feeling better soon.  ? ?Constipation, Adult ?Constipation is when a person has fewer than three bowel movements in a week, has difficulty having a bowel movement, or has stools (feces) that are dry, hard, or larger than normal. Constipation may be caused by an underlying condition. It may become worse with age if a person takes certain medicines and does not take in enough fluids. ?Follow these instructions at home: ?Eating and drinking ?Eat foods that have a lot of fiber, such as beans, whole grains, and fresh fruits and vegetables. ?Limit foods that are low in fiber and high in fat and processed sugars, such as fried or sweet foods. These include french fries, hamburgers, cookies, candies, and soda. ?Drink enough fluid to keep your urine pale yellow. ?General instructions ?Exercise regularly or as told by your health care provider. Try to do 150 minutes of moderate exercise each week. ?Use the bathroom when you have the urge to go. Do not hold it in. ?Take over-the-counter and prescription medicines only as told by your health care provider. This includes any fiber supplements. ?During bowel movements: ?Practice deep breathing while relaxing the lower abdomen. ?Practice pelvic floor relaxation. ?Watch your condition for any changes. Let your health care provider know about them. ?Keep all follow-up visits as told by your health care provider. This is important. ?Contact a health care provider if: ?You have pain that gets worse. ?You have a fever. ?You do not have a bowel movement after 4 days. ?You vomit. ?You are not hungry or you lose weight. ?You are bleeding from the opening between the buttocks (anus). ?You have thin, pencil-like stools. ?Get help right away if: ?You have a fever and your symptoms suddenly get worse. ?You leak stool or have blood in your stool. ?Your  abdomen is bloated. ?You have severe pain in your abdomen. ?You feel dizzy or you faint. ?Summary ?Constipation is when a person has fewer than three bowel movements in a week, has difficulty having a bowel movement, or has stools (feces) that are dry, hard, or larger than normal. ?Eat foods that have a lot of fiber, such as beans, whole grains, and fresh fruits and vegetables. ?Drink enough fluid to keep your urine pale yellow. ?Take over-the-counter and prescription medicines only as told by your health care provider. This includes any fiber supplements. ?This information is not intended to replace advice given to you by your health care provider. Make sure you discuss any questions you have with your health care provider. ?Document Revised: 10/01/2019 Document Reviewed: 10/01/2019 ?Elsevier Patient Education ? 2023 Elsevier Inc. ?  ? ?How to Prevent Constipation After Surgery ?Constipation is a common problem after surgery. Many things can make constipation more likely after a surgery, including: ?Certain medicines, especially numbing medicines (anesthetics) and very strong pain medicines called opioids. ?Feeling stressed because of the surgery. ?Eating different foods than normal. ?Being less active. ?Symptoms of constipation include: ?Having fewer than three bowel movements a week. ?Straining to have a bowel movement. ?Having hard, dry, or larger-than-normal stools (feces). ?Discomfort in the lower abdomen, such as cramps or bloating. ?Not feeling relief after having a bowel movement. ?Nausea and vomiting. ?You can take steps to help prevent constipation after surgery. ?Follow these instructions at home: ?Eating and drinking ?Eat foods that have a lot of fiber in them, such as beans, bran, whole grains, and  fresh fruits and vegetables. ?Limit foods that are high in fat and processed sugars, such as fried or sweet foods. These include french fries, hamburgers, cookies, and candy. ?Take a fiber supplement as told  by your health care provider. If you are not taking a fiber supplement and you think you are not getting enough fiber from foods, talk to your health care provider about adding a fiber supplement to your diet. ?Drink enough fluid to keep your urine pale yellow. ?Drink clear fluids, especially water. Avoid drinking alcohol, caffeine, and soda. These can make constipation worse. ?Activity ?After surgery, return to your normal activities slowly, or when your health care provider says it is okay. ?Start walking as soon as you can. Try to go a little farther each day. ?Once your health care provider approves, do some sort of regular exercise. This helps prevent constipation. ?Bowel movements ?Go to the restroom when you have the urge to go. Do not hold it in. ?Try drinking something hot to get a bowel movement started. ?Keep track of how often you use the restroom. ?Medicines ?Take over-the-counter and prescription medicines only as told by your health care provider. ?Talk to your health care provider about medicines that may help prevent constipation, particularly if you have a history of constipation. Your health care provider may suggest a stool softener, laxative, or fiber supplement. ?Do not take any medicines without talking to your health care provider first. ?Contact a health care provider if: ?You used stool softeners or laxatives and still have not had a bowel movement within 24-48 hours after using them. ?You have not had a bowel movement in 3 days. ?You have a fever. ?Get help right away if you have: ?Constipation that lasts for more than 4 days or if your symptoms get worse. ?Bright red blood in your stool. ?Pain in the abdomen or rectum. ?Very bad cramping. ?Thin, pencil-like stools. ?Unexplained weight loss. ?Summary ?Constipation is a common problem after surgery. Many things can make constipation more likely after a surgery, including certain medicines, eating different foods than normal, and being less  active. ?Symptoms of constipation include having fewer than three bowel movements a week, straining to have a bowel movement, and cramps or bloating in the lower abdomen. ?To help prevent constipation, you should eat foods that are high in fiber, drink plenty of fluids, and get regular physical activity. ?Your health care provider may suggest medicines, such as stool softeners or laxatives, to help prevent constipation. ?This information is not intended to replace advice given to you by your health care provider. Make sure you discuss any questions you have with your health care provider. ?Document Revised: 10/01/2019 Document Reviewed: 10/01/2019 ?Elsevier Patient Education ? 2023 Elsevier Inc. ?  ?

## 2022-03-29 ENCOUNTER — Ambulatory Visit (INDEPENDENT_AMBULATORY_CARE_PROVIDER_SITE_OTHER): Payer: No Typology Code available for payment source | Admitting: Surgical

## 2022-03-29 ENCOUNTER — Ambulatory Visit (INDEPENDENT_AMBULATORY_CARE_PROVIDER_SITE_OTHER): Payer: No Typology Code available for payment source

## 2022-03-29 DIAGNOSIS — Z96652 Presence of left artificial knee joint: Secondary | ICD-10-CM | POA: Diagnosis not present

## 2022-03-29 NOTE — Telephone Encounter (Signed)
Patient contacted via telephone saw orthopedics Dr Marlou Sa today.  Wants her to work on flexibility/increasing extension range of motion  Next appt 04/12/22.  PT scheduled twice a week.  Pain 6/10 today.  Prunes and colace helped to relieve her constipation on Monday 03/27/22.  Patient working on extension exercises straightening leg on bed and sitting in chair and propping foot on another chair/stool and pushing above knee to help straighten knee.  Patient reported stiffness is her biggest issue impacting AROM at this time.  He instructed her to push through it at this time/perform exercises he demonstrated today.  Unable to see office visit note in Epic at this time.  Knee xray performed at visit today.  HR notified next appt 04/12/22.  Patient denied further questions or concerns at this time.  Will follow up via telephone in a week with patient.  Patient verbalized understanding information/instructions, agreed with plan of care and had no further questions at this time.

## 2022-03-31 ENCOUNTER — Other Ambulatory Visit: Payer: Self-pay | Admitting: Surgical

## 2022-03-31 ENCOUNTER — Telehealth: Payer: Self-pay | Admitting: Surgical

## 2022-03-31 MED ORDER — OXYCODONE HCL 5 MG PO TABS
5.0000 mg | ORAL_TABLET | Freq: Four times a day (QID) | ORAL | 0 refills | Status: DC | PRN
Start: 2022-03-31 — End: 2022-04-26

## 2022-03-31 NOTE — Telephone Encounter (Signed)
Sent in a prescription for the oxycodone also refilled her muscle relaxer

## 2022-03-31 NOTE — Telephone Encounter (Signed)
Patient called advised the Rx for Oxycodone is not showing received at the pharmacy yet. Patient also asked if she can get a Rx for the muscle relaxer as well.   ?Patient said she is going to (PT) Monday and asked if the Rx can be called into the pharmacy today.    The number to contact patient is [phone]  ?

## 2022-03-31 NOTE — Telephone Encounter (Signed)
I called patient and advised. 

## 2022-03-31 NOTE — Telephone Encounter (Signed)
Please advise 

## 2022-04-02 ENCOUNTER — Encounter: Payer: Self-pay | Admitting: Surgical

## 2022-04-02 NOTE — Progress Notes (Signed)
? ?Post-Op Visit Note ?  ?Patient: Stephanie Chandler           ?Date of Birth: February 26, 1966           ?MRN: [id number] ?Visit Date: 03/29/2022 ?PCP: Aliene Beams, MD ? ? ?Assessment & Plan: ? ?Chief Complaint:  ?Chief Complaint  ?Patient presents with  ? Left Knee - Routine Post Op  ?  03/14/22 left TKA  ? ?Visit Diagnoses:  ?1. S/P total knee arthroplasty, left   ? ? ?Plan: Patient is a 56 year old female who presents s/p left total knee arthroplasty on 03/14/2022.  She had lateral tibial plateau fracture from implant of the prosthesis due to very good bone quality.  This was fixed with a plate and screws.  She has been weightbearing as tolerated.  She is walking increasingly easily.  Does note stiffness with immobility consistent with postop knee replacement.  Working with physical therapy 2 times per week.  Taking oxycodone about every 8 hours as needed.  Takes Tylenol every 4 hours.  Up to 90 degrees on the CPM machine.  Denies any chest pain, shortness of breath, abdominal pain, calf pain. ? ?On exam, incision is healing well without evidence of infection or dehiscence except for 1 small area about 5 mm in length at the proximal aspect of the incision.  Removal of the Aquacel bandage to remove the Steri-Strips and slightly open this with some superficial gapping but nothing that probed further.  There is no expressible drainage or any surrounding erythema.  It was easily reapproximated with Steri-Strips.  She was cautioned about the signs and symptoms of infection to look out for and will call the office if she notices any drainage or increasing redness/pain around the knee. ? ?No calf tenderness.  Negative Homans' sign.  Able to perform straight leg raise with good quad strength.  10 degrees extension with soft endpoint.  90 degrees of knee flexion. ? ?Plan is start physical therapy upstairs.  Continue with CPM machine until she gets in with PT.  Showed her in the clinic today how best to work on achieving full  extension.  Emphasized the importance of achieving full extension and she will specifically work on this more so than flexion until she gets in with PT.  Follow-up for clinical recheck regarding her extension range of motion and knee superior aspect of her incision in 2 weeks.  Left knee radiographs taken today demonstrate left total knee prosthesis in excellent position and alignment.  Tibial plateau fracture that occurred at surgery remains in anatomic position with no compromise of the plate and screws on the lateral aspect of the tibia. ? ?Follow-Up Instructions: No follow-ups on file.  ? ?Orders:  ?Orders Placed This Encounter  ?Procedures  ? XR Knee 1-2 Views Left  ? Ambulatory referral to Physical Therapy  ? ?No orders of the defined types were placed in this encounter. ? ? ?Imaging: ?No results found. ? ?PMFS History: ?Patient Active Problem List  ? Diagnosis Date Noted  ? Arthritis of left knee   ? S/P total knee arthroplasty, left 03/14/2022  ? Vitamin D deficiency 02/07/2022  ? Adjustment disorder with depressed mood 02/07/2022  ? Hyperlipidemia, unspecified 12/12/2021  ? 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 Urge incontinence of urine 12/12/2021  ? Degeneration of lumbar intervertebral disc 12/12/2021  ? Halitosis 07/25/2021  ? Abdominal bloating 06/23/2021  ? Flatulence, eructation and gas pain 06/23/2021  ? Chronic pain 11/04/2020  ?  Exacerbation of asthma 11/04/2020  ? Hyperglycemia due to type 2 diabetes mellitus (HCC) 11/04/2020  ? Mild intermittent asthma 11/04/2020  ? Intractable hiccups 11/10/2019  ? COVID-19 virus infection 11/10/2019  ? Morbid obesity (HCC) 12/12/2018  ? Acute pain of right shoulder 03/15/2017  ? Strain of right trapezius muscle 03/15/2017  ? Musculoskeletal pain 03/15/2017  ? OSA (obstructive sleep apnea) 08/23/2015  ? Sciatica 11/13/2012  ? ?Past Medical History:  ?Diagnosis Date  ? Arthritis   ? Asthma   ? Diabetes mellitus without  complication (HCC)   ? type 2  ? Sleep apnea   ? pt states "one doctor told me to wear CPAP, another told me I didn't need to, so I do not wear a CPAP"  ?  ?Family History  ?Problem Relation Age of Onset  ? Diabetes Mother   ? Hypertension Mother   ? Arthritis Mother   ? Diabetes Father   ? Kidney disease Father   ? Heart disease Maternal Grandmother   ? Diabetes Paternal Grandmother   ?  ?Past Surgical History:  ?Procedure Laterality Date  ? ABDOMINAL HYSTERECTOMY    ? CATARACT EXTRACTION Bilateral 2018  ? HYSTERECTOMY ABDOMINAL WITH SALPINGECTOMY  1997  ? TOTAL KNEE ARTHROPLASTY Left 03/14/2022  ? Procedure: LEFT TOTAL KNEE ARTHROPLASTY;  Surgeon: Cammy Copa, MD;  Location: [facility] OR;  Service: Orthopedics;  Laterality: Left;  ? ?Social History  ? ?Occupational History  ? Not on file  ?Tobacco Use  ? Smoking status: Former  ?  Packs/day: 0.12  ?  Years: 0.50  ?  Pack years: 0.06  ?  Types: Cigarettes  ?  Quit date: 2021  ?  Years since quitting: 2.3  ? Smokeless tobacco: Never  ?Vaping Use  ? Vaping Use: Never used  ?Substance and Sexual Activity  ? Alcohol use: Not Currently  ? Drug use: No  ? Sexual activity: Never  ? ? ? ?

## 2022-04-03 ENCOUNTER — Ambulatory Visit: Payer: No Typology Code available for payment source | Admitting: Physical Therapy

## 2022-04-03 ENCOUNTER — Encounter: Payer: Self-pay | Admitting: Physical Therapy

## 2022-04-03 ENCOUNTER — Other Ambulatory Visit: Payer: Self-pay

## 2022-04-03 DIAGNOSIS — M25662 Stiffness of left knee, not elsewhere classified: Secondary | ICD-10-CM

## 2022-04-03 DIAGNOSIS — R2689 Other abnormalities of gait and mobility: Secondary | ICD-10-CM

## 2022-04-03 DIAGNOSIS — M6281 Muscle weakness (generalized): Secondary | ICD-10-CM

## 2022-04-03 DIAGNOSIS — M25562 Pain in left knee: Secondary | ICD-10-CM

## 2022-04-03 DIAGNOSIS — R600 Localized edema: Secondary | ICD-10-CM

## 2022-04-03 NOTE — Therapy (Signed)
?OUTPATIENT PHYSICAL THERAPY LOWER EXTREMITY EVALUATION ? ? ?Patient Name: Stephanie Chandler ?MRN: [id number] ?DOB:[birth year], 56 y.o., female ?Today's Date: 04/03/2022 ? ? PT End of Session - 04/03/22 1156   ? ? Visit Number 1   ? Number of Visits 18   ? Date for PT Re-Evaluation 06/12/22   ? PT Start Time 1025   ? PT Stop Time 1105   ? PT Time Calculation (min) 40 min   ? Activity Tolerance Patient tolerated treatment well   ? Behavior During Therapy [facility] for tasks assessed/performed   ? ?  ?  ? ?  ? ? ?Past Medical History:  ?Diagnosis Date  ? Arthritis   ? Asthma   ? Diabetes mellitus without complication (HCC)   ? type 2  ? Sleep apnea   ? pt states "one doctor told me to wear CPAP, another told me I didn't need to, so I do not wear a CPAP"  ? ?Past Surgical History:  ?Procedure Laterality Date  ? ABDOMINAL HYSTERECTOMY    ? CATARACT EXTRACTION Bilateral 2018  ? HYSTERECTOMY ABDOMINAL WITH SALPINGECTOMY  1997  ? TOTAL KNEE ARTHROPLASTY Left 03/14/2022  ? Procedure: LEFT TOTAL KNEE ARTHROPLASTY;  Surgeon: Cammy Copa, MD;  Location: [facility] OR;  Service: Orthopedics;  Laterality: Left;  ? ?Patient Active Problem List  ? Diagnosis Date Noted  ? Arthritis of left knee   ? S/P total knee arthroplasty, left 03/14/2022  ? Vitamin D deficiency 02/07/2022  ? Adjustment disorder with depressed mood 02/07/2022  ? Hyperlipidemia, unspecified 12/12/2021  ? 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 Urge incontinence of urine 12/12/2021  ? Degeneration of lumbar intervertebral disc 12/12/2021  ? Halitosis 07/25/2021  ? Abdominal bloating 06/23/2021  ? Flatulence, eructation and gas pain 06/23/2021  ? Chronic pain 11/04/2020  ? Exacerbation of asthma 11/04/2020  ? Hyperglycemia due to type 2 diabetes mellitus (HCC) 11/04/2020  ? Mild intermittent asthma 11/04/2020  ? Intractable hiccups 11/10/2019  ? COVID-19 virus infection 11/10/2019  ? Morbid obesity (HCC) 12/12/2018  ? Acute pain of right  shoulder 03/15/2017  ? Strain of right trapezius muscle 03/15/2017  ? Musculoskeletal pain 03/15/2017  ? OSA (obstructive sleep apnea) 08/23/2015  ? Sciatica 11/13/2012  ? ? ?PCP: Aliene Beams, MD ? ?REFERRING PROVIDER:  ? ?REFERRING DIAG: Cammy Copa, MD ? ?THERAPY DIAG:  ?Acute pain of left knee ? ?Stiffness of left knee, not elsewhere classified ? ?Muscle weakness (generalized) ? ?Other abnormalities of gait and mobility ? ?Localized edema ? ?ONSET DATE: s/p left TKA on 03/14/22 ? ?SUBJECTIVE:  ? ?SUBJECTIVE STATEMENT: ?Her knee is painful and stiff S/P Lt TKA ? ?PERTINENT HISTORY: ?PMH includes DM, OSA, asthma, arthritis, Lt TKA ? ?PAIN:  ?Are you having pain? Yes: NPRS scale: 5/10 ?Pain location: Left knee anterior ?Pain description: sharp, burning, spasms ?Aggravating factors: sitting too long, trying to sleep ?Relieving factors: ice ? ?PRECAUTIONS: She had lateral tibial plateau fracture from implant of the prosthesis "due to very good bone quality."  This was fixed with a plate and screws.  She has been weightbearing as tolerated. ? ?WEIGHT BEARING RESTRICTIONS see above ? ?FALLS:  ?Has patient fallen in last 6 months? Yes, one time, has fear of falling ? ?LIVING ENVIRONMENT: ?2 steps to enter ? 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 ?PLOF: Independent ? ?PATIENT GOALS : back to work, get back to the gym, walking without walker, get rid of the pain ? ? ?OBJECTIVE:  ? ?  DIAGNOSTIC FINDINGS:  ?PATIENT SURVEYS:  ?FOTO 04/03/22 31% functional, goal 58% ? ?COGNITION: ? Overall cognitive status: Within functional limits for tasks assessed   ?  ?SENSATION: ?Light touch intact ? ?MUSCLE LENGTH: ?Hamstrings and quads tight on Lt ? ?PALPATION: ?TTP anterior left knee ? ?LE ROM: ? ? AROM/PROM Right ?04/03/2022 Left ?04/03/2022  ?Hip flexion    ?Hip extension    ?Hip abduction    ?Hip adduction    ?Hip internal rotation    ?Hip external rotation    ?Knee flexion  93/100  ?Knee extension  5/4  ?Ankle dorsiflexion     ?Ankle plantarflexion    ?Ankle inversion    ?Ankle eversion    ? (Blank rows = not tested) ? ?LE MMT: ? ?MMT in sitting Right ?04/03/2022 Left ?04/03/2022  ?Hip flexion  4  ?Hip extension    ?Hip abduction  4  ?Hip adduction    ?Hip internal rotation    ?Hip external rotation    ?Knee flexion  4  ?Knee extension  4  ?Ankle dorsiflexion    ?Ankle plantarflexion    ?Ankle inversion    ?Ankle eversion    ? (Blank rows = not tested) ? ?LOWER EXTREMITY SPECIAL TESTS:  ? ? ?FUNCTIONAL TESTS:  ? ? ?GAIT: ?Distance walked: 100 ?Assistive device utilized: Environmental consultant - 2 wheeled ?Level of assistance: Modified independence ?Comments: slower velocity, decreased hip/knee flexion on Lt ? ? ? ?TODAY'S TREATMENT: ?04/03/22 ?Reviewed and performed 10 reps of HEP listed below ?Vasopnumatic X 10 min to left knee with medium compression at 34 deg ? ? ?PATIENT EDUCATION:  ?Education details: HEP,PT plan of care ?Person educated: Patient ?Education method: Explanation, Demonstration, Verbal cues, and Handouts ?Education comprehension: verbalized understanding, returned demonstration, and needs further education ? ? ?HOME EXERCISE PROGRAM: ?Access Code: [email or web address] ?URL: [email or web address] ?Date: 04/03/2022 ?Prepared by: Ivery Quale ? ?Exercises ?- Supine Hamstring Stretch with Strap  - 2 x daily - 6 x weekly - 1 sets - 3 reps - 30 hold ?- Supine Heel Slide with Strap  - 2 x daily - 6 x weekly - 1-2 sets - 10 reps - 5 hold ?- Supine Quadricep Sets  - 2 x daily - 6 x weekly - 1-2 sets - 10 reps - 5 sec hold ?- Seated Long Arc Quad  - 2 x daily - 6 x weekly - 2-3 sets - 10 reps ?- Seated Knee Flexion Stretch  - 2 x daily - 6 x weekly - 1-2 sets - 10 reps - 5 sec hold ?- Sit to Stand with Armchair  - 2 x daily - 6 x weekly - 1-2 sets - 10 reps ? ? ?ASSESSMENT: ? ?CLINICAL IMPRESSION: Patient referred to PT s/p left TKA on 03/14/22. She had lateral tibial plateau fracture from implant of the prosthesis "due to very good bone  quality."  This was fixed with a plate and screws.  She is weightbearing as tolerated.  Patient will benefit from skilled PT to address below impairments, limitations and improve overall function. ? ?OBJECTIVE IMPAIRMENTS: decreased activity tolerance, difficulty walking, decreased balance, decreased endurance, decreased mobility, decreased ROM, decreased strength, impaired flexibility, impaired LE use, postural dysfunction, and pain. ? ?ACTIVITY LIMITATIONS: bending, lifting, carry, locomotion, cleaning, community activity, driving, and or occupation ? ?PERSONAL FACTORS: PMH includes DM, OSA, asthma, arthritis. are also affecting patient's functional outcome. ? ?REHAB POTENTIAL: Good ? ?CLINICAL DECISION MAKING: Stable/uncomplicated ? ?EVALUATION COMPLEXITY: Low ? ? ? ?GOALS: ?Short term PT Goals Target  date: 05/01/2022 ?Pt will be I and compliant with HEP. ?Baseline:  ?Goal status: New ?Pt will decrease pain by 25% overall ?Baseline: ?Goal status: New ? ?Long term PT goals Target date: 06/12/22 ?Pt will improve Lt knee ROM 0-115 deg to improve functional mobility ?Baseline: ?Goal status: New ?Pt will improve  hip/knee strength to at least 5-/5 MMT to improve functional strength ?Baseline: ?Goal status: New ?Pt will improve FOTO to at least 58% functional to show improved function ?Baseline: ?Goal status: New ?Pt will reduce pain by overall 75% overall with usual activity and be able to return to work ?Baseline: ?Goal status: New ?Pt will be able to ambulate community distances at least 1000 ft WNL gait pattern without complaints ?Baseline: ?Goal status: New ? ?PLAN: ?PT FREQUENCY: 1-2 times per week  ? ?PT DURATION: 6-8 weeks ? ?PLANNED INTERVENTIONS (unless contraindicated): aquatic PT, Canalith repositioning, cryotherapy, Electrical stimulation, Iontophoresis with 4 mg/ml dexamethasome, Moist heat, traction, Ultrasound, gait training, Therapeutic exercise, balance training, neuromuscular re-education,  patient/family education, prosthetic training, manual techniques, passive ROM, dry needling, taping, vasopnuematic device, vestibular, spinal manipulations, joint manipulations ? ?PLAN FOR NEXT SESSION: balance assessment, im

## 2022-04-05 ENCOUNTER — Ambulatory Visit: Payer: No Typology Code available for payment source | Admitting: Rehabilitative and Restorative Service Providers"

## 2022-04-05 ENCOUNTER — Encounter: Payer: No Typology Code available for payment source | Admitting: Rehabilitative and Restorative Service Providers"

## 2022-04-05 ENCOUNTER — Encounter: Payer: Self-pay | Admitting: Rehabilitative and Restorative Service Providers"

## 2022-04-05 DIAGNOSIS — M25562 Pain in left knee: Secondary | ICD-10-CM

## 2022-04-05 DIAGNOSIS — M25662 Stiffness of left knee, not elsewhere classified: Secondary | ICD-10-CM | POA: Diagnosis not present

## 2022-04-05 DIAGNOSIS — M6281 Muscle weakness (generalized): Secondary | ICD-10-CM

## 2022-04-05 DIAGNOSIS — R600 Localized edema: Secondary | ICD-10-CM

## 2022-04-05 DIAGNOSIS — R2689 Other abnormalities of gait and mobility: Secondary | ICD-10-CM | POA: Diagnosis not present

## 2022-04-05 NOTE — Therapy (Signed)
?OUTPATIENT PHYSICAL THERAPY TREATMENT NOTE ? ? ?Patient Name: Stephanie Chandler ?MRN: [id number] ?DOB:September 26, 1966, 56 y.o., female ?Today's Date: 04/05/2022 ? ?PCP: Stephanie Beams, MD ?REFERRING PROVIDER: Cammy Copa, MD ? ?END OF SESSION:  ? PT End of Session - 04/05/22 1521   ? ? Visit Number 2   ? Number of Visits 18   ? Date for PT Re-Evaluation 06/12/22   ? PT Start Time 1515   ? PT Stop Time 1605   ? PT Time Calculation (min) 50 min   ? Activity Tolerance Patient tolerated treatment well   ? Behavior During Therapy [facility] for tasks assessed/performed   ? ?  ?  ? ?  ? ? ?Past Medical History:  ?Diagnosis Date  ? Arthritis   ? Asthma   ? Diabetes mellitus without complication (HCC)   ? type 2  ? Sleep apnea   ? pt states "one doctor told me to wear CPAP, another told me I didn't need to, so I do not wear a CPAP"  ? ?Past Surgical History:  ?Procedure Laterality Date  ? ABDOMINAL HYSTERECTOMY    ? CATARACT EXTRACTION Bilateral 2018  ? HYSTERECTOMY ABDOMINAL WITH SALPINGECTOMY  1997  ? TOTAL KNEE ARTHROPLASTY Left 03/14/2022  ? Procedure: LEFT TOTAL KNEE ARTHROPLASTY;  Surgeon: Stephanie Copa, MD;  Location: [facility] OR;  Service: Orthopedics;  Laterality: Left;  ? ?Patient Active Problem List  ? Diagnosis Date Noted  ? Arthritis of left knee   ? S/P total knee arthroplasty, left 03/14/2022  ? Vitamin D deficiency 02/07/2022  ? Adjustment disorder with depressed mood 02/07/2022  ? Hyperlipidemia, unspecified 12/12/2021  ? 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 Urge incontinence of urine 12/12/2021  ? Degeneration of lumbar intervertebral disc 12/12/2021  ? Halitosis 07/25/2021  ? Abdominal bloating 06/23/2021  ? Flatulence, eructation and gas pain 06/23/2021  ? Chronic pain 11/04/2020  ? Exacerbation of asthma 11/04/2020  ? Hyperglycemia due to type 2 diabetes mellitus (HCC) 11/04/2020  ? Mild intermittent asthma 11/04/2020  ? Intractable hiccups 11/10/2019  ? COVID-19 virus  infection 11/10/2019  ? Morbid obesity (HCC) 12/12/2018  ? Acute pain of right shoulder 03/15/2017  ? Strain of right trapezius muscle 03/15/2017  ? Musculoskeletal pain 03/15/2017  ? OSA (obstructive sleep apnea) 08/23/2015  ? Sciatica 11/13/2012  ? ? ?REFERRING DIAG: W46.659 (ICD-10-CM) - S/P total knee arthroplasty, left ? ?THERAPY DIAG:  ?Other abnormalities of gait and mobility ? ?Stiffness of left knee, not elsewhere classified ? ?Acute pain of left knee ? ?Muscle weakness (generalized) ? ?Localized edema ? ?PERTINENT HISTORY: DM, OSA, asthma, arthritis, Lt TKA ? ?PRECAUTIONS: Post-TKA ? ?SUBJECTIVE: Stephanie Chandler reports good early HEP compliance.  She is getting 2-3 hours of uninterrupted sleep.  Ice, exercises and pain medication help. ? ?PAIN:  ?Are you having pain? Yes: NPRS scale: 3-8/10 ?Pain location: L knee ?Pain description: Stiff, achy, tight ?Aggravating factors: Prolonged postures and weight-bearing ?Relieving factors: Oxycodone, tylenol, ice and exercises ? ? ?OBJECTIVE: (objective measures completed at initial evaluation unless otherwise dated) ? ?OBJECTIVE:  ?  ?DIAGNOSTIC FINDINGS:  ?PATIENT SURVEYS:  ?FOTO 04/03/22 31% functional, goal 58% ?  ?COGNITION: ?          Overall cognitive status: Within functional limits for tasks assessed               ?           ?SENSATION: ?Light touch intact ?  ?MUSCLE LENGTH: ?Hamstrings  and quads tight on Lt ?  ?PALPATION: ?TTP anterior left knee ?  ?LE ROM: ?  ? AROM/PROM Right ?04/03/2022 Left ?04/03/2022 Left 04/05/2022  ?Hip flexion       ?Hip extension       ?Hip abduction       ?Hip adduction       ?Hip internal rotation       ?Hip external rotation       ?Knee flexion   93/100 Active 111  ?Knee extension   5/4 Active -2  ?Ankle dorsiflexion       ?Ankle plantarflexion       ?Ankle inversion       ?Ankle eversion       ? (Blank rows = not tested) ?  ?LE MMT: ?  ?MMT in sitting Right ?04/03/2022 Left ?04/03/2022  ?Hip flexion   4  ?Hip extension      ?Hip abduction   4   ?Hip adduction      ?Hip internal rotation      ?Hip external rotation      ?Knee flexion   4  ?Knee extension   4  ?Ankle dorsiflexion      ?Ankle plantarflexion      ?Ankle inversion      ?Ankle eversion      ? (Blank rows = not tested) ?  ?LOWER EXTREMITY SPECIAL TESTS:  ?  ?  ?FUNCTIONAL TESTS:  ?  ?  ?GAIT: ?Distance walked: 100 ?Assistive device utilized: Environmental consultantWalker - 2 wheeled ?Level of assistance: Modified independence ?Comments: slower velocity, decreased hip/knee flexion on Lt ?  ?  ?  ?TODAY'S TREATMENT: ?04/05/22 ?Therapeutic Exercises: ?Recumbent bike Seat 4 full range 8 minutes ?Tailgate knee flexion 3 minutes ?Knee flexion AAROM (R pushes L into flexion) 10X 10 seconds ?Quadriceps sets with L heel prop 2 sets of 10 for 5 seconds ? ?Therapeutic Activities: ?Leg Press full range 2 sets of 15 slow eccentrics with 75# double leg for stairs and sit to stand ? ?Vasopneumatic Medium 34 degrees L knee 10 minutes ? ? ?04/03/22 ?Reviewed and performed 10 reps of HEP listed below ?Vasopnumatic X 10 min to left knee with medium compression at 34 deg ?  ?  ?PATIENT EDUCATION:  ?Education details: HEP,PT plan of care ?Person educated: Patient ?Education method: Explanation, Demonstration, Verbal cues, and Handouts ?Education comprehension: verbalized understanding, returned demonstration, and needs further education ?  ?  ?HOME EXERCISE PROGRAM: ?Access Code: [email or web address] ?URL: [email or web address] ?Date: 04/03/2022 ?Prepared by: Stephanie QualeBrian Chandler ?  ?Exercises ?- Supine Hamstring Stretch with Strap  - 2 x daily - 6 x weekly - 1 sets - 3 reps - 30 hold ?- Supine Heel Slide with Strap  - 2 x daily - 6 x weekly - 1-2 sets - 10 reps - 5 hold ?- Supine Quadricep Sets  - 2 x daily - 6 x weekly - 1-2 sets - 10 reps - 5 sec hold ?- Seated Long Arc Quad  - 2 x daily - 6 x weekly - 2-3 sets - 10 reps ?- Seated Knee Flexion Stretch  - 2 x daily - 6 x weekly - 1-2 sets - 10 reps - 5 sec hold ?- Sit to Stand with Armchair  - 2  x daily - 6 x weekly - 1-2 sets - 10 reps ?  ?  ?ASSESSMENT: ?  ?CLINICAL IMPRESSION: Lynden AngCathy reports good early HEP compliance.  AROM is better today than at evaluation.  Continue focus on  AROM, quadriceps strength and edema control. ?  ?OBJECTIVE IMPAIRMENTS: decreased activity tolerance, difficulty walking, decreased balance, decreased endurance, decreased mobility, decreased ROM, decreased strength, impaired flexibility, impaired LE use, postural dysfunction, and pain. ?  ?ACTIVITY LIMITATIONS: bending, lifting, carry, locomotion, cleaning, community activity, driving, and or occupation ?  ?PERSONAL FACTORS: PMH includes DM, OSA, asthma, arthritis. are also affecting patient's functional outcome. ?  ?REHAB POTENTIAL: Good ?  ?CLINICAL DECISION MAKING: Stable/uncomplicated ?  ?EVALUATION COMPLEXITY: Low ?  ?  ?  ?GOALS: ?Short term PT Goals Target date: 05/01/2022 ?Pt will be I and compliant with HEP. ?Baseline:  ?Goal status: New ?Pt will decrease pain by 25% overall ?Baseline: ?Goal status: New ?  ?Long term PT goals Target date: 06/12/22 ?Pt will improve Lt knee ROM 0-115 deg to improve functional mobility ?Baseline: ?Goal status: New ?Pt will improve  hip/knee strength to at least 5-/5 MMT to improve functional strength ?Baseline: ?Goal status: New ?Pt will improve FOTO to at least 58% functional to show improved function ?Baseline: ?Goal status: New ?Pt will reduce pain by overall 75% overall with usual activity and be able to return to work ?Baseline: ?Goal status: New ?Pt will be able to ambulate community distances at least 1000 ft WNL gait pattern without complaints ?Baseline: ?Goal status: New ?  ?PLAN: ?PT FREQUENCY: 1-2 times per week  ?  ?PT DURATION: 6-8 weeks ?  ?PLANNED INTERVENTIONS (unless contraindicated): aquatic PT, Canalith repositioning, cryotherapy, Electrical stimulation, Iontophoresis with 4 mg/ml dexamethasome, Moist heat, traction, Ultrasound, gait training, Therapeutic exercise, balance  training, neuromuscular re-education, patient/family education, prosthetic training, manual techniques, passive ROM, dry needling, taping, vasopnuematic device, vestibular, spinal manipulations, joint manipulat

## 2022-04-05 NOTE — Telephone Encounter (Signed)
Patient seen in clinic and constipation was resolved with colace and dietary changes. ?

## 2022-04-06 ENCOUNTER — Encounter: Payer: No Typology Code available for payment source | Admitting: Rehabilitative and Restorative Service Providers"

## 2022-04-10 ENCOUNTER — Telehealth: Payer: Self-pay | Admitting: Orthopedic Surgery

## 2022-04-10 ENCOUNTER — Other Ambulatory Visit: Payer: Self-pay

## 2022-04-10 DIAGNOSIS — Z96652 Presence of left artificial knee joint: Secondary | ICD-10-CM

## 2022-04-10 NOTE — Telephone Encounter (Signed)
IC discussed with patient at length. Patient reported that she had acute onset of worsening pain for the last 3 days-she stated her calf was tender to touch but no SOB. She did also say that she had a history of DVT.  She stated pain was severe enough that it required her to take her pain medication. She stated that she has not had an injury. Has not been experiencing any fevers or chills, denies having any drainage from her incision. I have gotten her scheduled for doppler for Tuesday at 1pm. She will go to the ER if her symptoms worsen over night. She will send a photo of the incision to Luke/Dr August Saucer for review.  ?

## 2022-04-10 NOTE — Telephone Encounter (Signed)
Patient called. Says her leg is stiff. She is having a lot of pain. Says something is not right. Would like a call back. [phone] ?

## 2022-04-10 NOTE — Telephone Encounter (Signed)
Ok thx.

## 2022-04-11 ENCOUNTER — Telehealth: Payer: Self-pay

## 2022-04-11 ENCOUNTER — Ambulatory Visit (HOSPITAL_COMMUNITY)
Admission: RE | Admit: 2022-04-11 | Discharge: 2022-04-11 | Disposition: A | Discharge status: Home / Self Care | Payer: No Typology Code available for payment source | Source: Ambulatory Visit | Attending: Orthopedic Surgery | Admitting: Orthopedic Surgery

## 2022-04-11 ENCOUNTER — Encounter: Payer: No Typology Code available for payment source | Admitting: Rehabilitative and Restorative Service Providers"

## 2022-04-11 DIAGNOSIS — T84093A Other mechanical complication of internal left knee prosthesis, initial encounter: Secondary | ICD-10-CM

## 2022-04-11 DIAGNOSIS — T8484XA Pain due to internal orthopedic prosthetic devices, implants and grafts, initial encounter: Secondary | ICD-10-CM

## 2022-04-11 DIAGNOSIS — Z96652 Presence of left artificial knee joint: Secondary | ICD-10-CM | POA: Insufficient documentation

## 2022-04-11 NOTE — Telephone Encounter (Signed)
FYI- ? ?Patient is Negative for DVT, left LE, per Fleet Contras with [facility].  CB# [phone].  Please advise.  Thank you. ?

## 2022-04-11 NOTE — Telephone Encounter (Signed)
Stephanie Chandler is calling again asking for advice or what can be done to help with pain and inflammation in her leg until her appointment tomorrow? Please advise ?

## 2022-04-11 NOTE — Telephone Encounter (Signed)
We will just evaluate her tomorrow, prob need new xrays to check position of fracture

## 2022-04-11 NOTE — Telephone Encounter (Signed)
IC advised. Patient verbalized understanding 

## 2022-04-11 NOTE — Telephone Encounter (Signed)
IC see other note ?

## 2022-04-11 NOTE — Progress Notes (Signed)
Lower extremity venous LT study completed. ? ?Voicemail left with triage line. ? ?See CV Proc for preliminary results report.  ? ?Jean Rosenthal, RDMS, RVT ? ?

## 2022-04-12 ENCOUNTER — Encounter: Payer: No Typology Code available for payment source | Admitting: Physical Therapy

## 2022-04-12 ENCOUNTER — Ambulatory Visit (INDEPENDENT_AMBULATORY_CARE_PROVIDER_SITE_OTHER): Payer: No Typology Code available for payment source | Admitting: Orthopedic Surgery

## 2022-04-12 ENCOUNTER — Ambulatory Visit (INDEPENDENT_AMBULATORY_CARE_PROVIDER_SITE_OTHER): Payer: No Typology Code available for payment source

## 2022-04-12 DIAGNOSIS — Z96652 Presence of left artificial knee joint: Secondary | ICD-10-CM | POA: Diagnosis not present

## 2022-04-12 MED ORDER — NABUMETONE 500 MG PO TABS: 500.0000 mg | ORAL_TABLET | Freq: Two times a day (BID) | ORAL | 0 refills | Status: DC

## 2022-04-12 MED ORDER — TIZANIDINE HCL 4 MG PO TABS: 4.0000 mg | ORAL_TABLET | Freq: Two times a day (BID) | ORAL | 0 refills | Status: DC | PRN

## 2022-04-12 MED ORDER — HYDROCODONE-ACETAMINOPHEN 5-325 MG PO TABS
ORAL_TABLET | ORAL | 0 refills | Status: DC
Start: 2022-04-12 — End: 2022-04-26

## 2022-04-12 NOTE — Telephone Encounter (Signed)
Patient would like prescription to be placed at the [facility] on [address] not the [address] location. And she would like to know if her PT appt for next week need to be cancelled or will they know for Monday and Wednesday ?

## 2022-04-12 NOTE — Telephone Encounter (Signed)
IC advised this is where they had been sent.  ?

## 2022-04-15 ENCOUNTER — Encounter: Payer: Self-pay | Admitting: Orthopedic Surgery

## 2022-04-15 NOTE — Progress Notes (Signed)
Post-Op Visit Note   Patient: Stephanie Chandler           Date of Birth: 05/05/1966           MRN: [id number] Visit Date: 04/12/2022 PCP: Caren Macadam, MD   Assessment & Plan:  Chief Complaint:  Chief Complaint  Patient presents with   Left Knee - Routine Post Op   Visit Diagnoses:  1. S/P total knee arthroplasty, left     Plan: Stephanie Chandler is a 56 year old patient who underwent left total knee replacement 03/14/2022.  Reports some worsening pain over the last 2 to 3 weeks.  Some difficulty with ambulation but no fevers or chills.  She has had a Doppler done which was negative for DVT.  Taking Norco and tizanidine and Relafen all of which were refilled today.  On examination she has range of motion of 0 to about 95.  Collaterals are stable.  Incision intact with no effusion.  Radiographs unchanged from post op imaging.  Plan is to hold off on physical therapy this week but continue to work on home exercise program for range of motion.  3-week return.  She is going to be transitioning from walker to cane during that time.  Follow-Up Instructions: Return in about 3 weeks (around 05/03/2022).   Orders:  Orders Placed This Encounter  Procedures   XR Knee 1-2 Views Left   Meds ordered this encounter  Medications   nabumetone (RELAFEN) 500 MG tablet    Sig: Take 1 tablet (500 mg total) by mouth 2 (two) times daily.    Dispense:  60 tablet    Refill:  0   tiZANidine (ZANAFLEX) 4 MG tablet    Sig: Take 1 tablet (4 mg total) by mouth every 12 (twelve) hours as needed for muscle spasms.    Dispense:  30 tablet    Refill:  0   HYDROcodone-acetaminophen (NORCO/VICODIN) 5-325 MG tablet    Sig: 1 po q 6-8 hrs prn pain    Dispense:  35 tablet    Refill:  0    Imaging: No results found.  PMFS History: Patient Active Problem List   Diagnosis Date Noted   Arthritis of left knee    S/P total knee arthroplasty, left 03/14/2022   Vitamin D deficiency 02/07/2022   Adjustment disorder with  depressed mood 02/07/2022   Hyperlipidemia, unspecified 12/12/2021   Type 2 diabetes mellitus with unspecified complications ([place]) [id number]   Urge incontinence of urine 12/12/2021   Degeneration of lumbar intervertebral disc 12/12/2021   Halitosis 07/25/2021   Abdominal bloating 06/23/2021   Flatulence, eructation and gas pain 06/23/2021   Chronic pain 11/04/2020   Exacerbation of asthma 11/04/2020   Hyperglycemia due to type 2 diabetes mellitus ([place]) 11/04/2020   Mild intermittent asthma 11/04/2020   Intractable hiccups 11/10/2019   COVID-19 virus infection 11/10/2019   Morbid obesity ([place]) 12/12/2018   Acute pain of right shoulder 03/15/2017   Strain of right trapezius muscle 03/15/2017   Musculoskeletal pain 03/15/2017   OSA (obstructive sleep apnea) 08/23/2015   Sciatica 11/13/2012   Past Medical History:  Diagnosis Date   Arthritis    Asthma    Diabetes mellitus without complication ([place])    type 2   Sleep apnea    pt states "one doctor told me to wear CPAP, another told me I didn't need to, so I do not wear a CPAP"    Family History  Problem Relation Age of Onset   Diabetes  Mother    Hypertension Mother    Arthritis Mother    Diabetes Father    Kidney disease Father    Heart disease Maternal Grandmother    Diabetes Paternal Grandmother     Past Surgical History:  Procedure Laterality Date   ABDOMINAL HYSTERECTOMY     CATARACT EXTRACTION Bilateral 2018   HYSTERECTOMY ABDOMINAL WITH SALPINGECTOMY  1997   TOTAL KNEE ARTHROPLASTY Left 03/14/2022   Procedure: LEFT TOTAL KNEE ARTHROPLASTY;  Surgeon: Meredith Pel, MD;  Location: [facility];  Service: Orthopedics;  Laterality: Left;   Social History   Occupational History   Not on file  Tobacco Use   Smoking status: Former    Packs/day: 0.12    Years: 0.50    Pack years: 0.06    Types: Cigarettes    Quit date: 2021    Years since quitting: 2.3   Smokeless tobacco: Never  Vaping Use   Vaping Use: Never  used  Substance and Sexual Activity   Alcohol use: Not Currently   Drug use: No   Sexual activity: Never

## 2022-04-17 ENCOUNTER — Encounter: Payer: Self-pay | Admitting: Physical Therapy

## 2022-04-17 ENCOUNTER — Ambulatory Visit (INDEPENDENT_AMBULATORY_CARE_PROVIDER_SITE_OTHER): Payer: No Typology Code available for payment source | Admitting: Physical Therapy

## 2022-04-17 DIAGNOSIS — M25662 Stiffness of left knee, not elsewhere classified: Secondary | ICD-10-CM

## 2022-04-17 DIAGNOSIS — M25562 Pain in left knee: Secondary | ICD-10-CM

## 2022-04-17 DIAGNOSIS — R600 Localized edema: Secondary | ICD-10-CM

## 2022-04-17 DIAGNOSIS — R2689 Other abnormalities of gait and mobility: Secondary | ICD-10-CM | POA: Diagnosis not present

## 2022-04-17 DIAGNOSIS — M6281 Muscle weakness (generalized): Secondary | ICD-10-CM | POA: Diagnosis not present

## 2022-04-17 NOTE — Therapy (Signed)
OUTPATIENT PHYSICAL THERAPY TREATMENT NOTE   Patient Name: Stephanie Chandler MRN: [id number] DOB:[birth year], 56 y.o., female Today's Date: 04/17/2022  PCP: Aliene Beams, MD REFERRING PROVIDER: Cammy Copa, MD  END OF SESSION:   PT End of Session - 04/17/22 1428     Visit Number 3    Number of Visits 18    Date for PT Re-Evaluation 06/12/22    PT Start Time 1430    PT Stop Time 1515    PT Time Calculation (min) 45 min    Activity Tolerance Patient tolerated treatment well    Behavior During Therapy WFL for tasks assessed/performed             Past Medical History:  Diagnosis Date   Arthritis    Asthma    Diabetes mellitus without complication (HCC)    type 2   Sleep apnea    pt states "one doctor told me to wear CPAP, another told me I didn't need to, so I do not wear a CPAP"   Past Surgical History:  Procedure Laterality Date   ABDOMINAL HYSTERECTOMY     CATARACT EXTRACTION Bilateral 2018   HYSTERECTOMY ABDOMINAL WITH SALPINGECTOMY  1997   TOTAL KNEE ARTHROPLASTY Left 03/14/2022   Procedure: LEFT TOTAL KNEE ARTHROPLASTY;  Surgeon: Cammy Copa, MD;  Location: [facility] OR;  Service: Orthopedics;  Laterality: Left;   Patient Active Problem List   Diagnosis Date Noted   Arthritis of left knee    S/P total knee arthroplasty, left 03/14/2022   Vitamin D deficiency 02/07/2022   Adjustment disorder with depressed mood 02/07/2022   Hyperlipidemia, unspecified 12/12/2021   Type 2 diabetes mellitus with unspecified complications (HCC) 12/12/2021   Urge incontinence of urine 12/12/2021   Degeneration of lumbar intervertebral disc 12/12/2021   Halitosis 07/25/2021   Abdominal bloating 06/23/2021   Flatulence, eructation and gas pain 06/23/2021   Chronic pain 11/04/2020   Exacerbation of asthma 11/04/2020   Hyperglycemia due to type 2 diabetes mellitus (HCC) 11/04/2020   Mild intermittent asthma 11/04/2020   Intractable hiccups 11/10/2019   COVID-19 virus  infection 11/10/2019   Morbid obesity (HCC) 12/12/2018   Acute pain of right shoulder 03/15/2017   Strain of right trapezius muscle 03/15/2017   Musculoskeletal pain 03/15/2017   OSA (obstructive sleep apnea) 08/23/2015   Sciatica 11/13/2012    REFERRING DIAG: D78.242 (ICD-10-CM) - S/P total knee arthroplasty, left  THERAPY DIAG:  Other abnormalities of gait and mobility  Stiffness of left knee, not elsewhere classified  Acute pain of left knee  Muscle weakness (generalized)  Localized edema  PERTINENT HISTORY: DM, OSA, asthma, arthritis, Lt TKA  PRECAUTIONS: Post-TKA  SUBJECTIVE: She relays her knee feels more stiff today PAIN:  Are you having pain? Yes: NPRS scale: 3-8/10 Pain location: L knee Pain description: Stiff, achy, tight Aggravating factors: Prolonged postures and weight-bearing Relieving factors: Oxycodone, tylenol, ice and exercises   OBJECTIVE: (objective measures completed at initial evaluation unless otherwise dated)  OBJECTIVE:    DIAGNOSTIC FINDINGS:  PATIENT SURVEYS:  FOTO 04/03/22 31% functional, goal 58%   COGNITION:           Overall cognitive status: Within functional limits for tasks assessed                          SENSATION: Light touch intact   MUSCLE LENGTH: Hamstrings and quads tight on Lt   PALPATION: TTP anterior left knee   LE  ROM:    AROM/PROM Right 04/03/2022 Left 04/03/2022 Left 04/05/2022 Left 04/17/22  Hip flexion        Hip extension        Hip abduction        Hip adduction        Hip internal rotation        Hip external rotation        Knee flexion   93/100 Active 111 114/116  Knee extension   5/4 Active -2 -4/-2  Ankle dorsiflexion        Ankle plantarflexion        Ankle inversion        Ankle eversion         (Blank rows = not tested)   LE MMT:   MMT in sitting Right 04/03/2022 Left 04/03/2022  Hip flexion   4  Hip extension      Hip abduction   4  Hip adduction      Hip internal rotation      Hip  external rotation      Knee flexion   4  Knee extension   4  Ankle dorsiflexion      Ankle plantarflexion      Ankle inversion      Ankle eversion       (Blank rows = not tested)   LOWER EXTREMITY SPECIAL TESTS:      FUNCTIONAL TESTS:      GAIT: Distance walked: 100 Assistive device utilized: Environmental consultant - 2 wheeled Level of assistance: Modified independence Comments: slower velocity, decreased hip/knee flexion on Lt       TODAY'S TREATMENT:  04/17/22 Therapeutic Exercises: Recumbent bike Seat 4 full range 8 minutes Standing gastroc stretch on slantboard 30 sec X 3 bilat Knee flexion AAROM (R pushes L into flexion) 5 sec X 3 min Supine hamstring stretch on Lt 30 sec X 3 Quadriceps sets 2 sets of 10 for 5 seconds  Therapeutic Activities: Leg Press full range 2 sets of 15 slow eccentrics with 75# double leg then Rt leg only 25# 2X15 for stairs and sit to stand  Vasopneumatic Medium 34 degrees L knee 10 minutes  04/05/22 Therapeutic Exercises: Recumbent bike Seat 4 full range 8 minutes Tailgate knee flexion 3 minutes Knee flexion AAROM (R pushes L into flexion) 10X 10 seconds Quadriceps sets with L heel prop 2 sets of 10 for 5 seconds  Therapeutic Activities: Leg Press full range 2 sets of 15 slow eccentrics with 75# double leg for stairs and sit to stand  Vasopneumatic Medium 34 degrees L knee 10 minutes    PATIENT EDUCATION:  Education details: HEP,PT plan of care Person educated: Patient Education method: Explanation, Demonstration, Verbal cues, and Handouts Education comprehension: verbalized understanding, returned demonstration, and needs further education     HOME EXERCISE PROGRAM: Access Code: [email or web address] URL: [email or web address] Date: 04/03/2022 Prepared by: Ivery Quale   Exercises - Supine Hamstring Stretch with Strap  - 2 x daily - 6 x weekly - 1 sets - 3 reps - 30 hold - Supine Heel Slide with Strap  - 2 x daily - 6 x weekly - 1-2 sets  - 10 reps - 5 hold - Supine Quadricep Sets  - 2 x daily - 6 x weekly - 1-2 sets - 10 reps - 5 sec hold - Seated Long Arc Quad  - 2 x daily - 6 x weekly - 2-3 sets - 10 reps - Seated Knee Flexion Stretch  -  2 x daily - 6 x weekly - 1-2 sets - 10 reps - 5 sec hold - Sit to Stand with Armchair  - 2 x daily - 6 x weekly - 1-2 sets - 10 reps     ASSESSMENT:   CLINICAL IMPRESSION: ROM progressing well despite complaints of stiffness. We will continue to work to improve Lt knee ROM and quad strength to improve her overall function.    OBJECTIVE IMPAIRMENTS: decreased activity tolerance, difficulty walking, decreased balance, decreased endurance, decreased mobility, decreased ROM, decreased strength, impaired flexibility, impaired LE use, postural dysfunction, and pain.   ACTIVITY LIMITATIONS: bending, lifting, carry, locomotion, cleaning, community activity, driving, and or occupation   PERSONAL FACTORS: PMH includes DM, OSA, asthma, arthritis. are also affecting patient's functional outcome.   REHAB POTENTIAL: Good   CLINICAL DECISION MAKING: Stable/uncomplicated   EVALUATION COMPLEXITY: Low       GOALS: Short term PT Goals Target date: 05/01/2022 Pt will be I and compliant with HEP. Baseline:  Goal status: New Pt will decrease pain by 25% overall Baseline: Goal status: New   Long term PT goals Target date: 06/12/22 Pt will improve Lt knee ROM 0-115 deg to improve functional mobility Baseline: Goal status: New Pt will improve  hip/knee strength to at least 5-/5 MMT to improve functional strength Baseline: Goal status: New Pt will improve FOTO to at least 58% functional to show improved function Baseline: Goal status: New Pt will reduce pain by overall 75% overall with usual activity and be able to return to work Baseline: Goal status: New Pt will be able to ambulate community distances at least 1000 ft WNL gait pattern without complaints Baseline: Goal status: New    PLAN: PT FREQUENCY: 1-2 times per week    PT DURATION: 6-8 weeks   PLANNED INTERVENTIONS (unless contraindicated): aquatic PT, Canalith repositioning, cryotherapy, Electrical stimulation, Iontophoresis with 4 mg/ml dexamethasome, Moist heat, traction, Ultrasound, gait training, Therapeutic exercise, balance training, neuromuscular re-education, patient/family education, prosthetic training, manual techniques, passive ROM, dry needling, taping, vasopnuematic device, vestibular, spinal manipulations, joint manipulations   PLAN FOR NEXT SESSION:  Continue knee ROM and strength as tolerated.  Functional progressions (stairs).       April MansonBrian R Biance Moncrief, PT, DPT 04/17/2022, 2:29 PM

## 2022-04-19 ENCOUNTER — Encounter: Payer: Self-pay | Admitting: Physical Therapy

## 2022-04-19 ENCOUNTER — Ambulatory Visit (INDEPENDENT_AMBULATORY_CARE_PROVIDER_SITE_OTHER): Payer: No Typology Code available for payment source | Admitting: Physical Therapy

## 2022-04-19 DIAGNOSIS — M25562 Pain in left knee: Secondary | ICD-10-CM | POA: Diagnosis not present

## 2022-04-19 DIAGNOSIS — M6281 Muscle weakness (generalized): Secondary | ICD-10-CM | POA: Diagnosis not present

## 2022-04-19 DIAGNOSIS — R600 Localized edema: Secondary | ICD-10-CM

## 2022-04-19 DIAGNOSIS — R2689 Other abnormalities of gait and mobility: Secondary | ICD-10-CM | POA: Diagnosis not present

## 2022-04-19 DIAGNOSIS — M25662 Stiffness of left knee, not elsewhere classified: Secondary | ICD-10-CM

## 2022-04-19 NOTE — Therapy (Signed)
OUTPATIENT PHYSICAL THERAPY TREATMENT NOTE   Patient Name: Stephanie Chandler MRN: [id number] DOB:1966-10-03, 56 y.o., female Today's Date: 04/19/2022  PCP: Aliene Beams, MD REFERRING PROVIDER: Cammy Copa, MD  END OF SESSION:   PT End of Session - 04/19/22 1423     Visit Number 4    Number of Visits 18    Date for PT Re-Evaluation 06/12/22    PT Start Time 1425    PT Stop Time 1510    PT Time Calculation (min) 45 min    Activity Tolerance Patient tolerated treatment well    Behavior During Therapy WFL for tasks assessed/performed             Past Medical History:  Diagnosis Date   Arthritis    Asthma    Diabetes mellitus without complication (HCC)    type 2   Sleep apnea    pt states "one doctor told me to wear CPAP, another told me I didn't need to, so I do not wear a CPAP"   Past Surgical History:  Procedure Laterality Date   ABDOMINAL HYSTERECTOMY     CATARACT EXTRACTION Bilateral 2018   HYSTERECTOMY ABDOMINAL WITH SALPINGECTOMY  1997   TOTAL KNEE ARTHROPLASTY Left 03/14/2022   Procedure: LEFT TOTAL KNEE ARTHROPLASTY;  Surgeon: Cammy Copa, MD;  Location: [facility] OR;  Service: Orthopedics;  Laterality: Left;   Patient Active Problem List   Diagnosis Date Noted   Arthritis of left knee    S/P total knee arthroplasty, left 03/14/2022   Vitamin D deficiency 02/07/2022   Adjustment disorder with depressed mood 02/07/2022   Hyperlipidemia, unspecified 12/12/2021   Type 2 diabetes mellitus with unspecified complications (HCC) 12/12/2021   Urge incontinence of urine 12/12/2021   Degeneration of lumbar intervertebral disc 12/12/2021   Halitosis 07/25/2021   Abdominal bloating 06/23/2021   Flatulence, eructation and gas pain 06/23/2021   Chronic pain 11/04/2020   Exacerbation of asthma 11/04/2020   Hyperglycemia due to type 2 diabetes mellitus (HCC) 11/04/2020   Mild intermittent asthma 11/04/2020   Intractable hiccups 11/10/2019   COVID-19 virus  infection 11/10/2019   Morbid obesity (HCC) 12/12/2018   Acute pain of right shoulder 03/15/2017   Strain of right trapezius muscle 03/15/2017   Musculoskeletal pain 03/15/2017   OSA (obstructive sleep apnea) 08/23/2015   Sciatica 11/13/2012    REFERRING DIAG: T24.580 (ICD-10-CM) - S/P total knee arthroplasty, left  THERAPY DIAG:  Other abnormalities of gait and mobility  Stiffness of left knee, not elsewhere classified  Acute pain of left knee  Muscle weakness (generalized)  Localized edema  PERTINENT HISTORY: DM, OSA, asthma, arthritis, Lt TKA  PRECAUTIONS: Post-TKA  SUBJECTIVE: She relays her knee feels more stiff today PAIN:  Are you having pain? Yes: NPRS scale: 3-8/10 Pain location: L knee Pain description: Stiff, achy, tight Aggravating factors: Prolonged postures and weight-bearing Relieving factors: Oxycodone, tylenol, ice and exercises   OBJECTIVE: (objective measures completed at initial evaluation unless otherwise dated)  OBJECTIVE:    DIAGNOSTIC FINDINGS:  PATIENT SURVEYS:  FOTO 04/03/22 31% functional, goal 58%   COGNITION:           Overall cognitive status: Within functional limits for tasks assessed                          SENSATION: Light touch intact   MUSCLE LENGTH: Hamstrings and quads tight on Lt   PALPATION: TTP anterior left knee   LE  ROM:    AROM/PROM Right 04/03/2022 Left 04/03/2022 Left 04/05/2022 Left 04/17/22  Hip flexion        Hip extension        Hip abduction        Hip adduction        Hip internal rotation        Hip external rotation        Knee flexion   93/100 Active 111 114/116  Knee extension   5/4 Active -2 -4/-2  Ankle dorsiflexion        Ankle plantarflexion        Ankle inversion        Ankle eversion         (Blank rows = not tested)   LE MMT:   MMT in sitting Right 04/03/2022 Left 04/03/2022  Hip flexion   4  Hip extension      Hip abduction   4  Hip adduction      Hip internal rotation      Hip  external rotation      Knee flexion   4  Knee extension   4  Ankle dorsiflexion      Ankle plantarflexion      Ankle inversion      Ankle eversion       (Blank rows = not tested)   LOWER EXTREMITY SPECIAL TESTS:      FUNCTIONAL TESTS:      GAIT: Distance walked: 100 Assistive device utilized: Environmental consultant - 2 wheeled Level of assistance: Modified independence Comments: slower velocity, decreased hip/knee flexion on Lt       TODAY'S TREATMENT: 04/19/22 Therapeutic Exercises: Recumbent bike Seat 4 full range 8 minutes Step ups and down in front for Left LE onto 4 inch step X10  Leg Press full range 2 sets of 15 slow eccentrics with 75# double leg then Rt leg only 25# 2X10  gastroc stretch on slantboard 30 sec X 3 bilat Knee flexion AAROM (R pushes L into flexion) 5 sec X 3 min Seated LAQ on left 2# 2X10 Seated SLR on left 2X10 Seated hamstring stretch on Lt 30 sec X 3  Manual therapy Rt knee PROM to tolerance  Modalities Vasopneumatic Medium 34 degrees L knee 10 minutes  04/17/22 Therapeutic Exercises: Recumbent bike Seat 4 full range 8 minutes Standing gastroc stretch on slantboard 30 sec X 3 bilat Knee flexion AAROM (R pushes L into flexion) 5 sec X 3 min Supine hamstring stretch on Lt 30 sec X 3 Quadriceps sets 2 sets of 10 for 5 seconds  Therapeutic Activities: Leg Press full range 2 sets of 15 slow eccentrics with 75# double leg then Rt leg only 25# 2X15 for stairs and sit to stand  Vasopneumatic Medium 34 degrees L knee 10 minutes     PATIENT EDUCATION:  Education details: HEP,PT plan of care Person educated: Patient Education method: Explanation, Demonstration, Verbal cues, and Handouts Education comprehension: verbalized understanding, returned demonstration, and needs further education     HOME EXERCISE PROGRAM: Access Code: [email or web address] URL: [email or web address] Date: 04/03/2022 Prepared by: Ivery Quale   Exercises - Supine Hamstring  Stretch with Strap  - 2 x daily - 6 x weekly - 1 sets - 3 reps - 30 hold - Supine Heel Slide with Strap  - 2 x daily - 6 x weekly - 1-2 sets - 10 reps - 5 hold - Supine Quadricep Sets  - 2 x daily - 6 x weekly - 1-2  sets - 10 reps - 5 sec hold - Seated Long Arc Quad  - 2 x daily - 6 x weekly - 2-3 sets - 10 reps - Seated Knee Flexion Stretch  - 2 x daily - 6 x weekly - 1-2 sets - 10 reps - 5 sec hold - Sit to Stand with Armchair  - 2 x daily - 6 x weekly - 1-2 sets - 10 reps     ASSESSMENT:   CLINICAL IMPRESSION: She is having some stiffness and soreness as expected in her Left knee post op TKA but she does have good overall tolerance to exercise program to improve ROM and strength.    OBJECTIVE IMPAIRMENTS: decreased activity tolerance, difficulty walking, decreased balance, decreased endurance, decreased mobility, decreased ROM, decreased strength, impaired flexibility, impaired LE use, postural dysfunction, and pain.   ACTIVITY LIMITATIONS: bending, lifting, carry, locomotion, cleaning, community activity, driving, and or occupation   PERSONAL FACTORS: PMH includes DM, OSA, asthma, arthritis. are also affecting patient's functional outcome.   REHAB POTENTIAL: Good   CLINICAL DECISION MAKING: Stable/uncomplicated   EVALUATION COMPLEXITY: Low       GOALS: Short term PT Goals Target date: 05/01/2022 Pt will be I and compliant with HEP. Baseline:  Goal status: New Pt will decrease pain by 25% overall Baseline: Goal status: New   Long term PT goals Target date: 06/12/22 Pt will improve Lt knee ROM 0-115 deg to improve functional mobility Baseline: Goal status: New Pt will improve  hip/knee strength to at least 5-/5 MMT to improve functional strength Baseline: Goal status: New Pt will improve FOTO to at least 58% functional to show improved function Baseline: Goal status: New Pt will reduce pain by overall 75% overall with usual activity and be able to return to  work Baseline: Goal status: New Pt will be able to ambulate community distances at least 1000 ft WNL gait pattern without complaints Baseline: Goal status: New   PLAN: PT FREQUENCY: 1-2 times per week    PT DURATION: 6-8 weeks   PLANNED INTERVENTIONS (unless contraindicated): aquatic PT, Canalith repositioning, cryotherapy, Electrical stimulation, Iontophoresis with 4 mg/ml dexamethasome, Moist heat, traction, Ultrasound, gait training, Therapeutic exercise, balance training, neuromuscular re-education, patient/family education, prosthetic training, manual techniques, passive ROM, dry needling, taping, vasopnuematic device, vestibular, spinal manipulations, joint manipulations   PLAN FOR NEXT SESSION:  Continue knee ROM and strength as tolerated.  Functional progressions (stairs).       April MansonBrian R Anquan Azzarello, PT, DPT 04/19/2022, 2:23 PM

## 2022-04-21 ENCOUNTER — Telehealth: Payer: Self-pay | Admitting: Orthopedic Surgery

## 2022-04-21 DIAGNOSIS — Z96652 Presence of left artificial knee joint: Secondary | ICD-10-CM

## 2022-04-21 NOTE — Telephone Encounter (Signed)
She can take her pain medication 1 pill every 4-6 hours as needed as we evaluate her new knee pain

## 2022-04-21 NOTE — Telephone Encounter (Signed)
Order entered

## 2022-04-21 NOTE — Telephone Encounter (Signed)
Patient called. Would like to know if she should increase her medication or wait? Her call back number is [phone]

## 2022-04-21 NOTE — Telephone Encounter (Signed)
Pls order ct scan left knee to eval components thx

## 2022-04-21 NOTE — Telephone Encounter (Signed)
Pt called requesting a vcall back from Dr August Saucer. Pt states she is in a lot of pain and nothing is working to help pain. Please call pt at [phone]

## 2022-04-21 NOTE — Telephone Encounter (Signed)
Please advise 

## 2022-04-24 NOTE — Telephone Encounter (Signed)
Per epic review patient with increased pain 5/26 and CT scan ordered to check knee components by orthopedics

## 2022-04-25 ENCOUNTER — Encounter: Payer: No Typology Code available for payment source | Admitting: Physical Therapy

## 2022-04-25 NOTE — Telephone Encounter (Signed)
Phone straight to VM. Left VM calling to check in on pt and her constipation sx. CHL review reveals she has new knee pain and CT scheduled 6/5.

## 2022-04-26 ENCOUNTER — Other Ambulatory Visit: Payer: Self-pay | Admitting: Surgical

## 2022-04-26 ENCOUNTER — Telehealth: Payer: Self-pay | Admitting: Orthopedic Surgery

## 2022-04-26 MED ORDER — HYDROCODONE-ACETAMINOPHEN 5-325 MG PO TABS: ORAL_TABLET | ORAL | 0 refills | Status: DC

## 2022-04-26 NOTE — Telephone Encounter (Signed)
IC advised.  

## 2022-04-26 NOTE — Telephone Encounter (Signed)
Pt calling wondering if she can get something for the pain in her leg.   [phone]

## 2022-04-26 NOTE — Telephone Encounter (Signed)
Sent in refill of hydrocodone as we await CT scan on 05/01/22

## 2022-04-27 ENCOUNTER — Encounter: Payer: No Typology Code available for payment source | Admitting: Physical Therapy

## 2022-04-27 ENCOUNTER — Other Ambulatory Visit: Payer: Self-pay | Admitting: Orthopedic Surgery

## 2022-04-27 NOTE — Telephone Encounter (Signed)
Pt returned call. She reports aside from new pain that is being evaluated next week, she is doing well overall. Incision site is well healed, closed. Sts it will be itchy and tender to the touch. Unsure if incision only related or is connected to the other internal pain she is having. Is going to try vitamin e oil and aquaphor over incision area for skin stretching/healing and itching. Frustrated with the limitations the new pain has imposed but trying to stay positive and hoping for additional answers and plan of care next week. Denies any current needs or concerns.

## 2022-05-01 ENCOUNTER — Other Ambulatory Visit: Payer: No Typology Code available for payment source

## 2022-05-01 ENCOUNTER — Ambulatory Visit
Admission: RE | Admit: 2022-05-01 | Discharge: 2022-05-01 | Disposition: A | Discharge status: Home / Self Care | Payer: No Typology Code available for payment source | Source: Ambulatory Visit | Attending: Orthopedic Surgery | Admitting: Orthopedic Surgery

## 2022-05-01 DIAGNOSIS — Z96652 Presence of left artificial knee joint: Secondary | ICD-10-CM

## 2022-05-01 NOTE — Telephone Encounter (Signed)
Reviewed RN Hildred Alamin note will follow up with patient this week via telephone.  Agreed with plan of care for incision.

## 2022-05-02 ENCOUNTER — Encounter: Payer: No Typology Code available for payment source | Admitting: Physical Therapy

## 2022-05-03 ENCOUNTER — Telehealth: Payer: Self-pay | Admitting: Surgical

## 2022-05-03 ENCOUNTER — Encounter: Payer: Self-pay | Admitting: Orthopedic Surgery

## 2022-05-03 ENCOUNTER — Ambulatory Visit: Payer: No Typology Code available for payment source | Admitting: Surgical

## 2022-05-03 ENCOUNTER — Other Ambulatory Visit: Payer: Self-pay | Admitting: Surgical

## 2022-05-03 DIAGNOSIS — Z96652 Presence of left artificial knee joint: Secondary | ICD-10-CM

## 2022-05-03 MED ORDER — GABAPENTIN 300 MG PO CAPS: 300.0000 mg | ORAL_CAPSULE | Freq: Three times a day (TID) | ORAL | 0 refills | Status: DC

## 2022-05-03 NOTE — Telephone Encounter (Signed)
[employer] and discussed

## 2022-05-03 NOTE — Telephone Encounter (Signed)
Please call the medication into [facility] on [address]    Also please call the pt to discuss how she is suppose to wear her stockings

## 2022-05-03 NOTE — Progress Notes (Signed)
Post-Op Visit Note   Patient: Stephanie Chandler           Date of Birth: August 13, 1966           MRN: [id number] Visit Date: 05/03/2022 PCP: Aliene Beams, MD   Assessment & Plan:  Chief Complaint:  Chief Complaint  Patient presents with   Left Knee - Follow-up    03/14/22 left TKA   Visit Diagnoses:  1. S/P total knee arthroplasty, left     Plan: Patient is a 56 year old female who presents s/p left total knee arthroplasty on 03/14/2022.  She is doing okay overall.  She complains of anterior pain in the left knee with increased sensitivity to light pressure and touch.  She has radiation of pain from the anterior aspect of the knee that radiates down to about mid shin.  She also notes lateral numbness on the lateral aspect of the incision.  She is ambulating with a cane.  Taking hydrocodone for pain control but would like to discontinue this.  Denies any fevers, chills, night sweats, drainage from the incision, change in appearance of the incision, chest pain, shortness of breath.  No significant calf pain.  On exam, patient has 2 degrees of knee extension with about 115 degrees of knee flexion.  Incision is well-healed.  No sinus tract noted.  Small effusion noted.  No significant pain with passive motion of the knee up until terminal flexion.  No calf tenderness.  Negative Homans' sign.  Intact pedal pulses with 2+ DP pulse.  Patient is able to perform straight leg raise with 5 -/5 quad strength.  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  This is likely a part of the normal healing process of postop knee replacement but may be some irritation of the saphenous nerve given the distribution of her symptoms.  Her range of motion is excellent and she has no signs of infection on exam today.  She does have recent CT scan that was done yesterday of the left knee that shows no displacement at the fracture site with excellent reduction of the fracture fragment and no  evidence of loosening.  No periprosthetic fluid collection.  Plan to trial gabapentin as well as compression socks for the swelling she is having.  She will resume physical therapy upstairs.  Recommended she try topical application of Voltaren or Biofreeze over the incision.  Follow-up in 4 weeks for clinical recheck.  Work note provided today and determination of return to work at the next appointment.   Follow-Up Instructions: Return in about 4 weeks (around 05/31/2022).   Orders:  Orders Placed This Encounter  Procedures   Ambulatory referral to Physical Therapy   Meds ordered this encounter  Medications   gabapentin (NEURONTIN) 300 MG capsule    Sig: Take 1 capsule (300 mg total) by mouth 3 (three) times daily.    Dispense:  90 capsule    Refill:  0    Imaging: No results found.  PMFS History: Patient Active Problem List   Diagnosis Date Noted   Arthritis of left knee    S/P total knee arthroplasty, left 03/14/2022   Vitamin D deficiency 02/07/2022   Adjustment disorder with depressed mood 02/07/2022   Hyperlipidemia, unspecified 12/12/2021   Type 2 diabetes mellitus with unspecified complications (HCC) 12/12/2021   Urge incontinence of urine 12/12/2021   Degeneration of lumbar intervertebral disc 12/12/2021   Halitosis 07/25/2021   Abdominal bloating 06/23/2021   Flatulence, eructation and gas  pain 06/23/2021   Chronic pain 11/04/2020   Exacerbation of asthma 11/04/2020   Hyperglycemia due to type 2 diabetes mellitus (HCC) 11/04/2020   Mild intermittent asthma 11/04/2020   Intractable hiccups 11/10/2019   COVID-19 virus infection 11/10/2019   Morbid obesity (HCC) 12/12/2018   Acute pain of right shoulder 03/15/2017   Strain of right trapezius muscle 03/15/2017   Musculoskeletal pain 03/15/2017   OSA (obstructive sleep apnea) 08/23/2015   Sciatica 11/13/2012   Past Medical History:  Diagnosis Date   Arthritis    Asthma    Diabetes mellitus without complication  (HCC)    type 2   Sleep apnea    pt states "one doctor told me to wear CPAP, another told me I didn't need to, so I do not wear a CPAP"    Family History  Problem Relation Age of Onset   Diabetes Mother    Hypertension Mother    Arthritis Mother    Diabetes Father    Kidney disease Father    Heart disease Maternal Grandmother    Diabetes Paternal Grandmother     Past Surgical History:  Procedure Laterality Date   ABDOMINAL HYSTERECTOMY     CATARACT EXTRACTION Bilateral 2018   HYSTERECTOMY ABDOMINAL WITH SALPINGECTOMY  1997   TOTAL KNEE ARTHROPLASTY Left 03/14/2022   Procedure: LEFT TOTAL KNEE ARTHROPLASTY;  Surgeon: Cammy Copa, MD;  Location: [facility] OR;  Service: Orthopedics;  Laterality: Left;   Social History   Occupational History   Not on file  Tobacco Use   Smoking status: Former    Packs/day: 0.12    Years: 0.50    Pack years: 0.06    Types: Cigarettes    Quit date: 2021    Years since quitting: 2.4   Smokeless tobacco: Never  Vaping Use   Vaping Use: Never used  Substance and Sexual Activity   Alcohol use: Not Currently   Drug use: No   Sexual activity: Never

## 2022-05-04 ENCOUNTER — Encounter: Payer: No Typology Code available for payment source | Admitting: Physical Therapy

## 2022-05-05 ENCOUNTER — Encounter: Payer: Self-pay | Admitting: Rehabilitative and Restorative Service Providers"

## 2022-05-05 ENCOUNTER — Encounter: Payer: No Typology Code available for payment source | Admitting: Rehabilitative and Restorative Service Providers"

## 2022-05-05 ENCOUNTER — Ambulatory Visit (INDEPENDENT_AMBULATORY_CARE_PROVIDER_SITE_OTHER): Payer: No Typology Code available for payment source | Admitting: Rehabilitative and Restorative Service Providers"

## 2022-05-05 DIAGNOSIS — Z96652 Presence of left artificial knee joint: Secondary | ICD-10-CM

## 2022-05-05 NOTE — Therapy (Signed)
OUTPATIENT PHYSICAL THERAPY TREATMENT NOTE   Patient Name: Stephanie Chandler MRN: [id number] DOB:[birth year], 56 y.o., female 89 Date: 05/05/2022  PCP: Caren Macadam, MD REFERRING PROVIDER: Meredith Pel, MD  END OF SESSION:   PT End of Session - 05/05/22 1105     Visit Number 5    Number of Visits 18    Date for PT Re-Evaluation 06/12/22    PT Start Time 1057    PT Stop Time 1136    PT Time Calculation (min) 39 min    Activity Tolerance Patient tolerated treatment well    Behavior During Therapy WFL for tasks assessed/performed              Past Medical History:  Diagnosis Date   Arthritis    Asthma    Diabetes mellitus without complication ([place])    type 2   Sleep apnea    pt states "one doctor told me to wear CPAP, another told me I didn't need to, so I do not wear a CPAP"   Past Surgical History:  Procedure Laterality Date   ABDOMINAL HYSTERECTOMY     CATARACT EXTRACTION Bilateral 2018   HYSTERECTOMY ABDOMINAL WITH SALPINGECTOMY  1997   TOTAL KNEE ARTHROPLASTY Left 03/14/2022   Procedure: LEFT TOTAL KNEE ARTHROPLASTY;  Surgeon: Meredith Pel, MD;  Location: [facility];  Service: Orthopedics;  Laterality: Left;   Patient Active Problem List   Diagnosis Date Noted   Arthritis of left knee    S/P total knee arthroplasty, left 03/14/2022   Vitamin D deficiency 02/07/2022   Adjustment disorder with depressed mood 02/07/2022   Hyperlipidemia, unspecified 12/12/2021   Type 2 diabetes mellitus with unspecified complications ([place]) 41/96/2229   Urge incontinence of urine 12/12/2021   Degeneration of lumbar intervertebral disc 12/12/2021   Halitosis 07/25/2021   Abdominal bloating 06/23/2021   Flatulence, eructation and gas pain 06/23/2021   Chronic pain 11/04/2020   Exacerbation of asthma 11/04/2020   Hyperglycemia due to type 2 diabetes mellitus ([place]) 11/04/2020   Mild intermittent asthma 11/04/2020   Intractable hiccups 11/10/2019   COVID-19 virus  infection 11/10/2019   Morbid obesity ([place]) 12/12/2018   Acute pain of right shoulder 03/15/2017   Strain of right trapezius muscle 03/15/2017   Musculoskeletal pain 03/15/2017   OSA (obstructive sleep apnea) 08/23/2015   Sciatica 11/13/2012    REFERRING DIAG: N98.921 (ICD-10-CM) - S/P total knee arthroplasty, left  THERAPY DIAG:  S/P total knee arthroplasty, left  PERTINENT HISTORY: DM, OSA, asthma, arthritis, Lt TKA  PRECAUTIONS: Post-TKA  SUBJECTIVE: Pt indicated not feeling as much trouble and sleeping some better at night.  Pt indicated having CT scan with good results.   PAIN:  Are you having pain? Yes: NPRS scale: 4/10 Pain location: L knee Pain description: Stiff, achy, tight Aggravating factors: Bending, WB pressure Relieving factors: Oxycodone, tylenol, ice and exercises   OBJECTIVE: (objective measures completed at initial evaluation unless otherwise dated)    DIAGNOSTIC FINDINGS:  PATIENT SURVEYS:  04/03/2022 FOTO 04/03/22 31% functional, goal 58%   COGNITION: 04/03/2022 Overall cognitive status: Within functional limits for tasks assessed                          SENSATION: 04/03/2022 Light touch intact   MUSCLE LENGTH: 04/03/2022 Hamstrings and quads tight on Lt   PALPATION: 5/8/2023TTP anterior left knee   LE ROM:    AROM/PROM Right 04/03/2022 Left 04/03/2022 Left 04/05/2022 Left 04/17/22 Left  05/05/2022  Hip flexion         Hip extension         Hip abduction         Hip adduction         Hip internal rotation         Hip external rotation         Knee flexion   93/100 Active 111 114/116 AROM supine heel slide : 120  Knee extension   5/4 Active -2 -4/-2 -10 in sitting LAQ AROM  -2 in supine AROM  Ankle dorsiflexion         Ankle plantarflexion         Ankle inversion         Ankle eversion          (Blank rows = not tested)   LE MMT:   MMT in sitting Right 04/03/2022 Left 04/03/2022 Right 05/05/2022 Left 05/05/2022  Hip flexion   4    Hip extension         Hip abduction   4    Hip adduction        Hip internal rotation        Hip external rotation        Knee flexion   4    Knee extension   4 5/5 58.9, 54.6 lbs 4+/5 32.7, 31.8 lbs  Ankle dorsiflexion        Ankle plantarflexion        Ankle inversion        Ankle eversion         (Blank rows = not tested)   LOWER EXTREMITY SPECIAL TESTS:      FUNCTIONAL TESTS:      GAIT: 05/05/2022:  SPC use in Rt UE to and within clinic.  Reported independent at home  04/03/2022 Distance walked: 100 Assistive device utilized: Environmental consultant - 2 wheeled Level of assistance: Modified independence Comments: slower velocity, decreased hip/knee flexion on Lt       TODAY'S TREATMENT: 05/05/2022 Therapeutic Exercises:  Recumbent bike Seat 3 Lvl 3 8 mins   Incline stretch 30 sec x 3   Seated Lt knee flexion stretch c Rt leg overpressure 10-15 seconds x 6 throughout visit   TherActivity  Leg press double leg 75 lbs x 15, single leg Lt 2 x 15 31 lbs  Step up on and down forward WB on 6 inch step x 6, 4inch step x 14 with single hand rail assist  Neuro Re-ed  [employer]-way ankle strategy anterior/posterior weight shift on foam 2 mins  Tandem stance on foam 1 min x 1 bilateral  04/19/22 Therapeutic Exercises: Recumbent bike Seat 4 full range 8 minutes Step ups and down in front for Left LE onto 4 inch step X10  Leg Press full range 2 sets of 15 slow eccentrics with 75# double leg then Rt leg only 25# 2X10  gastroc stretch on slantboard 30 sec X 3 bilat Knee flexion AAROM (R pushes L into flexion) 5 sec X 3 min Seated LAQ on left 2# 2X10 Seated SLR on left 2X10 Seated hamstring stretch on Lt 30 sec X 3  Manual therapy Rt knee PROM to tolerance  Modalities Vasopneumatic Medium 34 degrees L knee 10 minutes  04/17/22 Therapeutic Exercises: Recumbent bike Seat 4 full range 8 minutes Standing gastroc stretch on slantboard 30 sec X 3 bilat Knee flexion AAROM (R pushes L into flexion) 5 sec X 3  min Supine hamstring  stretch on Lt 30 sec X 3 Quadriceps sets 2 sets of 10 for 5 seconds  Therapeutic Activities: Leg Press full range 2 sets of 15 slow eccentrics with 75# double leg then Rt leg only 25# 2X15 for stairs and sit to stand  Vasopneumatic Medium 34 degrees L knee 10 minutes     PATIENT EDUCATION:  Education details: HEP,PT plan of care Person educated: Patient Education method: Explanation, Demonstration, Verbal cues, and Handouts Education comprehension: verbalized understanding, returned demonstration, and needs further education     HOME EXERCISE PROGRAM: Access Code: [email or web address] URL: [email or web address] Date: 04/03/2022 Prepared by: Elsie Ra   Exercises - Supine Hamstring Stretch with Strap  - 2 x daily - 6 x weekly - 1 sets - 3 reps - 30 hold - Supine Heel Slide with Strap  - 2 x daily - 6 x weekly - 1-2 sets - 10 reps - 5 hold - Supine Quadricep Sets  - 2 x daily - 6 x weekly - 1-2 sets - 10 reps - 5 sec hold - Seated Long Arc Quad  - 2 x daily - 6 x weekly - 2-3 sets - 10 reps - Seated Knee Flexion Stretch  - 2 x daily - 6 x weekly - 1-2 sets - 10 reps - 5 sec hold - Sit to Stand with Armchair  - 2 x daily - 6 x weekly - 1-2 sets - 10 reps     ASSESSMENT:   CLINICAL IMPRESSION:  Return to clinic today after extended time away showed good AROM as noted and fairly good strength but limitations in functional WB activity.  Dynamometry was 57 % range on average of Rt today for extension of Lt knee.  Recommend continued skilled PT services for progressive strength and balance improved to facilitate independent ambulation and improvement towards goals.    OBJECTIVE IMPAIRMENTS: decreased activity tolerance, difficulty walking, decreased balance, decreased endurance, decreased mobility, decreased ROM, decreased strength, impaired flexibility, impaired LE use, postural dysfunction, and pain.   ACTIVITY LIMITATIONS: bending, lifting, carry,  locomotion, cleaning, community activity, driving, and or occupation   PERSONAL FACTORS: PMH includes DM, OSA, asthma, arthritis. are also affecting patient's functional outcome.   REHAB POTENTIAL: Good   CLINICAL DECISION MAKING: Stable/uncomplicated   EVALUATION COMPLEXITY: Low       GOALS: Short term PT Goals Target date: 05/01/2022 Pt will be I and compliant with HEP. Baseline:  Goal status: MET 05/05/2022 Pt will decrease pain by 25% overall Baseline: Goal status: Met 05/05/2022   Long term PT goals Target date: 06/12/22 Pt will improve Lt knee ROM 0-115 deg to improve functional mobility Baseline: Goal status: on going 05/05/2022 Pt will improve  hip/knee strength to at least 5-/5 MMT to improve functional strength Baseline: Goal status: on going 05/05/2022 Pt will improve FOTO to at least 58% functional to show improved function Baseline: Goal status: on going 05/05/2022 Pt will reduce pain by overall 75% overall with usual activity and be able to return to work Baseline: Goal status: on going 05/05/2022 Pt will be able to ambulate community distances at least 1000 ft WNL gait pattern without complaints Baseline: Goal status: on going 05/05/2022   PLAN: PT FREQUENCY: 1-2 times per week    PT DURATION: 6-8 weeks   PLANNED INTERVENTIONS (unless contraindicated): aquatic PT, Canalith repositioning, cryotherapy, Electrical stimulation, Iontophoresis with 4 mg/ml dexamethasome, Moist heat, traction, Ultrasound, gait training, Therapeutic exercise, balance training, neuromuscular re-education, patient/family education, prosthetic training, manual techniques, passive ROM, dry  needling, taping, vasopnuematic device, vestibular, spinal manipulations, joint manipulations   PLAN FOR NEXT SESSION:  Progressive strengthening and balance improvements.  Seated extension machine. FOTO reassessment due to time.     Scot Jun, PT, DPT, OCS, ATC 05/05/22  11:37 AM

## 2022-05-08 ENCOUNTER — Encounter: Payer: No Typology Code available for payment source | Admitting: Physical Therapy

## 2022-05-09 ENCOUNTER — Other Ambulatory Visit: Payer: Self-pay | Admitting: Orthopedic Surgery

## 2022-05-09 ENCOUNTER — Telehealth: Payer: Self-pay | Admitting: Surgical

## 2022-05-09 NOTE — Telephone Encounter (Signed)
Called and spoke with patient.  Advised her to discontinue tizanidine as this may contribute to some of that sensation and she is not really having any muscle spasms.  Also recommended she take hydrocodone and gabapentin at different times separated by several hours.  She will try this and then call back in a couple days if she still notices this "high" sensation

## 2022-05-09 NOTE — Telephone Encounter (Signed)
Patient would like to know how she is supposed to take her medications because she started taking her gabapentin, Hydrocodone, nambumetone and tizanidine all together and she is feeling very "high" and it makes her feel bad she needs to know what she is supposed to take together please advise.

## 2022-05-09 NOTE — Telephone Encounter (Signed)
Needs to pick between Relafen and Celebrex, cant take both

## 2022-05-11 ENCOUNTER — Other Ambulatory Visit: Payer: Self-pay | Admitting: Orthopedic Surgery

## 2022-05-14 ENCOUNTER — Other Ambulatory Visit: Payer: Self-pay | Admitting: Surgical

## 2022-05-16 ENCOUNTER — Telehealth: Payer: Self-pay | Admitting: Surgical

## 2022-05-16 ENCOUNTER — Encounter: Payer: No Typology Code available for payment source | Admitting: Physical Therapy

## 2022-05-16 ENCOUNTER — Encounter: Payer: Self-pay | Admitting: Rehabilitative and Restorative Service Providers"

## 2022-05-16 ENCOUNTER — Ambulatory Visit (INDEPENDENT_AMBULATORY_CARE_PROVIDER_SITE_OTHER): Payer: No Typology Code available for payment source | Admitting: Rehabilitative and Restorative Service Providers"

## 2022-05-16 ENCOUNTER — Other Ambulatory Visit: Payer: Self-pay | Admitting: Surgical

## 2022-05-16 DIAGNOSIS — M6281 Muscle weakness (generalized): Secondary | ICD-10-CM | POA: Diagnosis not present

## 2022-05-16 DIAGNOSIS — R2689 Other abnormalities of gait and mobility: Secondary | ICD-10-CM | POA: Diagnosis not present

## 2022-05-16 DIAGNOSIS — M25562 Pain in left knee: Secondary | ICD-10-CM

## 2022-05-16 DIAGNOSIS — M25662 Stiffness of left knee, not elsewhere classified: Secondary | ICD-10-CM

## 2022-05-16 DIAGNOSIS — R600 Localized edema: Secondary | ICD-10-CM

## 2022-05-16 NOTE — Therapy (Signed)
OUTPATIENT PHYSICAL THERAPY TREATMENT NOTE   Patient Name: Stephanie Chandler MRN: [id number] DOB:August 28, 1966, 56 y.o., female Today's Date: 05/16/2022  PCP: Caren Macadam, MD REFERRING PROVIDER: Meredith Pel, MD  END OF SESSION:   PT End of Session - 05/16/22 1419     Visit Number 6    Number of Visits 18    Date for PT Re-Evaluation 06/12/22    PT Start Time 1347    PT Stop Time 1430    PT Time Calculation (min) 43 min    Activity Tolerance Patient tolerated treatment well    Behavior During Therapy WFL for tasks assessed/performed             Past Medical History:  Diagnosis Date   Arthritis    Asthma    Diabetes mellitus without complication ([place])    type 2   Sleep apnea    pt states "one doctor told me to wear CPAP, another told me I didn't need to, so I do not wear a CPAP"   Past Surgical History:  Procedure Laterality Date   ABDOMINAL HYSTERECTOMY     CATARACT EXTRACTION Bilateral 2018   HYSTERECTOMY ABDOMINAL WITH SALPINGECTOMY  1997   TOTAL KNEE ARTHROPLASTY Left 03/14/2022   Procedure: LEFT TOTAL KNEE ARTHROPLASTY;  Surgeon: Meredith Pel, MD;  Location: [facility];  Service: Orthopedics;  Laterality: Left;   Patient Active Problem List   Diagnosis Date Noted   Arthritis of left knee    S/P total knee arthroplasty, left 03/14/2022   Vitamin D deficiency 02/07/2022   Adjustment disorder with depressed mood 02/07/2022   Hyperlipidemia, unspecified 12/12/2021   Type 2 diabetes mellitus with unspecified complications ([place]) 35/57/3220   Urge incontinence of urine 12/12/2021   Degeneration of lumbar intervertebral disc 12/12/2021   Halitosis 07/25/2021   Abdominal bloating 06/23/2021   Flatulence, eructation and gas pain 06/23/2021   Chronic pain 11/04/2020   Exacerbation of asthma 11/04/2020   Hyperglycemia due to type 2 diabetes mellitus ([place]) 11/04/2020   Mild intermittent asthma 11/04/2020   Intractable hiccups 11/10/2019   COVID-19 virus  infection 11/10/2019   Morbid obesity ([place]) 12/12/2018   Acute pain of right shoulder 03/15/2017   Strain of right trapezius muscle 03/15/2017   Musculoskeletal pain 03/15/2017   OSA (obstructive sleep apnea) 08/23/2015   Sciatica 11/13/2012    REFERRING DIAG: U54.270 (ICD-10-CM) - S/P total knee arthroplasty, left  THERAPY DIAG:  Other abnormalities of gait and mobility  Stiffness of left knee, not elsewhere classified  Acute pain of left knee  Muscle weakness (generalized)  Localized edema  PERTINENT HISTORY: DM, OSA, asthma, arthritis, Lt TKA  PRECAUTIONS: Post-TKA  SUBJECTIVE: Caren Griffins reports being very happy with her progress with physical therapy.  She requested a progress update to see if she is ready for more independent rehabilitation.  PAIN:  Are you having pain? Yes: NPRS scale: 0-5/10 Pain location: L knee Pain description: Stiff, achy, tight Aggravating factors: Bending, WB pressure Relieving factors: hydrocodone, gabapentin, ice and exercises   OBJECTIVE: (objective measures completed at initial evaluation unless otherwise dated)    DIAGNOSTIC FINDINGS:  PATIENT SURVEYS:  05/16/2022 FOTO 57 04/03/2022 FOTO 04/03/22 31% functional, goal 58%   COGNITION: 04/03/2022 Overall cognitive status: Within functional limits for tasks assessed                          SENSATION: 04/03/2022 Light touch intact   MUSCLE LENGTH: 04/03/2022 Hamstrings and  quads tight on Lt   PALPATION: 5/8/2023TTP anterior left knee   LE ROM:    AROM/PROM Right 04/03/2022 Left 04/03/2022 Left 04/05/2022 Left 04/17/22 Left 05/05/2022 Left/Right 05/16/2022  Hip flexion          Hip extension          Hip abduction          Hip adduction          Hip internal rotation          Hip external rotation          Knee flexion   93/100 Active 111 114/116 AROM supine heel slide : 120 114/124  Knee extension   5/4 Active -2 -4/-2 -10 in sitting LAQ AROM  -2 in supine AROM -2/0  Ankle  dorsiflexion          Ankle plantarflexion          Ankle inversion          Ankle eversion           (Blank rows = not tested)   LE MMT:   MMT in sitting Right 04/03/2022 Left 04/03/2022 Right 05/05/2022 Left 05/05/2022 L/R in pounds  Hip flexion   4     Hip extension         Hip abduction   4     Hip adduction         Hip internal rotation         Hip external rotation         Knee flexion   4     Knee extension   4 5/5 58.9, 54.6 lbs 4+/5 32.7, 31.8 lbs 55.7/72.1  Ankle dorsiflexion         Ankle plantarflexion         Ankle inversion         Ankle eversion          (Blank rows = not tested)   LOWER EXTREMITY SPECIAL TESTS:      FUNCTIONAL TESTS:      GAIT: 05/05/2022:  SPC use in Rt UE to and within clinic.  Reported independent at home  04/03/2022 Distance walked: 100 Assistive device utilized: Environmental consultant - 2 wheeled Level of assistance: Modified independence Comments: slower velocity, decreased hip/knee flexion on Lt       TODAY'S TREATMENT: 05/16/2022: Functional Activities Step-up and over 4, 6 and 8 inch step 10X each slow eccentrics Lateral tap-downs 10X 4 inch slow eccentrics Leg Press DL 75# 10X slow eccentrics SL 37# 10X slow eccentrics For stairs, standing endurance required for work and sit to stand Answered questions about the gym in regards to her long-term HEP  Knee extension machine 90-40 up with both, down L only slow eccentrics 15# 10X and 10# 10X   05/05/2022 Therapeutic Exercises:  Recumbent bike Seat 3 Lvl 3 8 mins   Incline stretch 30 sec x 3   Seated Lt knee flexion stretch c Rt leg overpressure 10-15 seconds x 6 throughout visit   TherActivity  Leg press double leg 75 lbs x 15, single leg Lt 2 x 15 31 lbs  Step up on and down forward WB on 6 inch step x 6, 4inch step x 14 with single hand rail assist  Neuro Re-ed  [employer] ankle strategy anterior/posterior weight shift on foam 2 mins  Tandem stance on foam 1 min x 1  bilateral   04/19/22 Therapeutic Exercises: Recumbent bike Seat 4 full range  8 minutes Step ups and down in front for Left LE onto 4 inch step X10  Leg Press full range 2 sets of 15 slow eccentrics with 75# double leg then Rt leg only 25# 2X10  gastroc stretch on slantboard 30 sec X 3 bilat Knee flexion AAROM (R pushes L into flexion) 5 sec X 3 min Seated LAQ on left 2# 2X10 Seated SLR on left 2X10 Seated hamstring stretch on Lt 30 sec X 3  Manual therapy Rt knee PROM to tolerance  Modalities Vasopneumatic Medium 34 degrees L knee 10 minutes    PATIENT EDUCATION:  Education details: HEP,PT plan of care Person educated: Patient Education method: Explanation, Demonstration, Verbal cues, and Handouts Education comprehension: verbalized understanding, returned demonstration, and needs further education     HOME EXERCISE PROGRAM: Access Code: [email or web address] URL: [email or web address] Date: 04/03/2022 Prepared by: Elsie Ra   Exercises - Supine Hamstring Stretch with Strap  - 2 x daily - 6 x weekly - 1 sets - 3 reps - 30 hold - Supine Heel Slide with Strap  - 2 x daily - 6 x weekly - 1-2 sets - 10 reps - 5 hold - Supine Quadricep Sets  - 2 x daily - 6 x weekly - 1-2 sets - 10 reps - 5 sec hold - Seated Long Arc Quad  - 2 x daily - 6 x weekly - 2-3 sets - 10 reps - Seated Knee Flexion Stretch  - 2 x daily - 6 x weekly - 1-2 sets - 10 reps - 5 sec hold - Sit to Stand with Armchair  - 2 x daily - 6 x weekly - 1-2 sets - 10 reps     ASSESSMENT:   CLINICAL IMPRESSION:  Charlotte aced her reassessment today.  AROM is lacking just 2 degrees of extension (edema) with flexion at 114 degrees.  Her L quadriceps strength is 77% of the uninvolved R and her FOTO is within 1 of her visit 16 goal (in only 6 visits).  He has requested (and appears ready for) independent rehabilitation and is being discharged from supervised PT today.   OBJECTIVE IMPAIRMENTS: decreased activity  tolerance, difficulty walking, decreased balance, decreased endurance, decreased mobility, decreased ROM, decreased strength, impaired flexibility, impaired LE use, postural dysfunction, and pain.   ACTIVITY LIMITATIONS: bending, lifting, carry, locomotion, cleaning, community activity, driving, and or occupation   PERSONAL FACTORS: PMH includes DM, OSA, asthma, arthritis. are also affecting patient's functional outcome.   REHAB POTENTIAL: Good   CLINICAL DECISION MAKING: Stable/uncomplicated   EVALUATION COMPLEXITY: Low       GOALS: Short term PT Goals Target date: 05/01/2022 Pt will be I and compliant with HEP. Baseline:  Goal status: MET 05/05/2022 Pt will decrease pain by 25% overall Baseline: Goal status: Met 05/05/2022   Long term PT goals Target date: 06/12/22 Pt will improve Lt knee ROM 0-115 deg to improve functional mobility Baseline: Goal status: Very close 05/16/2022 Pt will improve  hip/knee strength to at least 5-/5 MMT to improve functional strength Baseline: Goal status: 77% strength 05/16/2022 Pt will improve FOTO to at least 58% functional to show improved function Baseline: Goal status: Within 1 of 16 visit goal at visit 6 05/16/2022 Pt will reduce pain by overall 75% overall with usual activity and be able to return to work Baseline: Goal status: Mostly muscle pain 05/16/2022 Pt will be able to ambulate community distances at least 1000 ft WNL gait pattern without complaints Baseline: Goal status: Met 05/16/2022  PLAN: PT FREQUENCY: DC   PT DURATION: DC   PLANNED INTERVENTIONS (unless contraindicated): aquatic PT, Canalith repositioning, cryotherapy, Electrical stimulation, Iontophoresis with 4 mg/ml dexamethasome, Moist heat, traction, Ultrasound, gait training, Therapeutic exercise, balance training, neuromuscular re-education, patient/family education, prosthetic training, manual techniques, passive ROM, dry needling, taping, vasopnuematic device, vestibular,  spinal manipulations, joint manipulations   PLAN FOR NEXT SESSION:  DC    Farley Ly PT, MPT 05/16/22  4:55 PM

## 2022-05-16 NOTE — Telephone Encounter (Signed)
I am not sure what medication specifically she wants refilled?

## 2022-05-16 NOTE — Telephone Encounter (Signed)
Pt called requesting medication for swelling and pain to take every 4 to 6 hrs due to pain and swelling coming back. Please send to pharmacy on file. Pt phone number is [phone]

## 2022-05-17 ENCOUNTER — Other Ambulatory Visit: Payer: Self-pay | Admitting: Surgical

## 2022-05-17 MED ORDER — NABUMETONE 500 MG PO TABS: ORAL_TABLET | ORAL | 0 refills | Status: DC

## 2022-05-17 MED ORDER — TRAMADOL HCL 50 MG PO TABS: 50.0000 mg | ORAL_TABLET | Freq: Two times a day (BID) | ORAL | 0 refills | Status: DC | PRN

## 2022-05-17 MED ORDER — LIDOCAINE 4 % EX PTCH
1.0000 | MEDICATED_PATCH | CUTANEOUS | 1 refills | Status: AC
Start: 2022-05-17 — End: 2022-05-23

## 2022-05-17 MED ORDER — CAPSAICIN 0.075 % EX CREA: 1.0000 | TOPICAL_CREAM | Freq: Two times a day (BID) | CUTANEOUS | 0 refills | Status: DC

## 2022-05-17 NOTE — Telephone Encounter (Signed)
Sent in refill for her.  Do not take tramadol with the hydrocodone.  She should discontinue the hydrocodone at this point really.

## 2022-05-17 NOTE — Telephone Encounter (Signed)
Patient contacted via telephone follow up rehab post total knee surgery.  Had PT yesterday met many of her goals and progressing to independence.  Struggling with hypersensitive incision site/nerve pain.  Gabapentin/narcotics making her too sleepy.  Has tried ice, biofreeze, compression hose, massage.  All work for a limited time.  Discussed NIH recommendations for treatment nerve pain after total knee surgery.  Narcotics last choice.  Activity/exercise first choice e.g. aquatics/walking/bicycling recumbent.   Has TENS unit at home used previously for back pain.  Discussed can try on knee when she has to remove sock due to too tight end of day.  Can also try capsaicin cream .075% apply BID prn pain #28gm RF0 or lidocaine patch apply 1 per day prn pain #3 RF1 electronic Rx sent to her pharmacy of choice. Discussed wearing gloves/thorough handwashing/avoid touching face after applying capsaicin cream.  Discussed it is made from hot peppers.  Discussed it binds onto nerve site to give her relief.  Discussed socks/massage/tens bind/occupy the nerve site to give pain relief--it can't signal pain if busy with another task.   Discussed frequently in the past neither medication is covered by insurance.  Discussed some patients find tramadol or antidepressent more helpful than gabapentin and if no relief with above measures to discuss with her provider as I am limited by my contract and cannot prescribe these for her.  Discussed nerves were cut during surgery and they are trying to grow back together (the cut ends) send out signal.  They are very slow growing 9m per month typically so tingling/electrical shock sensation typically occurs for months as they continue to grow/heal.  Patient asked if she can restart epsom salt soaks in tub.  If incision fully healed no longer scabbed yes and if her orthopedics provider cleared her for aquatic therapy she is cleared to take baths again.  Patient going to try tens/capsaicin and  lidocaine patch.  Discussed I am on vacation through 5 Jul starting Friday and she should follow up with her PT/orthopedics provider if needed for refills or change in pain management therapy.  Discussed with patient her symptoms not uncommon after knee replacements.  Next appt with orthopedics Jul 5th.  Patient agreed with plan of care and had no further questions at this time.

## 2022-05-18 ENCOUNTER — Encounter: Payer: No Typology Code available for payment source | Admitting: Physical Therapy

## 2022-05-22 ENCOUNTER — Encounter: Payer: No Typology Code available for payment source | Admitting: Physical Therapy

## 2022-05-24 ENCOUNTER — Encounter: Payer: No Typology Code available for payment source | Admitting: Physical Therapy

## 2022-05-31 ENCOUNTER — Ambulatory Visit (INDEPENDENT_AMBULATORY_CARE_PROVIDER_SITE_OTHER): Payer: No Typology Code available for payment source | Admitting: Orthopedic Surgery

## 2022-05-31 ENCOUNTER — Other Ambulatory Visit: Payer: Self-pay | Admitting: Surgical

## 2022-05-31 DIAGNOSIS — Z96652 Presence of left artificial knee joint: Secondary | ICD-10-CM

## 2022-05-31 DIAGNOSIS — M1712 Unilateral primary osteoarthritis, left knee: Secondary | ICD-10-CM

## 2022-05-31 MED ORDER — CYCLOBENZAPRINE HCL 10 MG PO TABS: ORAL_TABLET | ORAL | 0 refills | Status: DC

## 2022-06-01 ENCOUNTER — Encounter: Payer: Self-pay | Admitting: Orthopedic Surgery

## 2022-06-01 NOTE — Progress Notes (Signed)
Post-Op Visit Note   Patient: Stephanie Chandler           Date of Birth: [birth year]           MRN: [id number] Visit Date: 05/31/2022 PCP: Aliene Beams, MD   Assessment & Plan:  Chief Complaint:  Chief Complaint  Patient presents with   Left Knee - Routine Post Op    left total knee arthroplasty on 03/14/2022   Visit Diagnoses:  1. S/P total knee arthroplasty, left   2. Unilateral primary osteoarthritis, left knee     Plan: Stephanie Chandler is a 56 year old patient underwent left total knee replacement 03/14/2022.  Her bone exceedingly hard and she did have a small tibial plateau fracture which we put a buttress plate on.  Components otherwise well fixed.  Postop CT scan looked good.  Overall she is making good progress but she does report a less than expected amount of walking endurance at this time.  She is able to walk for about 30 minutes.  She has finished physical therapy.  She is not able quite to get around the block.  She can stand about 10 minutes.  Taking tramadol gabapentin and Relafen.  I would like for her to stop the Relafen because she is having some GI reflux type symptoms.  We will refill the Flexeril and see her back in 4 weeks with repeat radiographs at that time.  I think it is okay for her to start work 6 hours a day only starting on 725 for 4 weeks.  Follow-Up Instructions: Return in about 4 weeks (around 06/28/2022).   Orders:  No orders of the defined types were placed in this encounter.  Meds ordered this encounter  Medications   cyclobenzaprine (FLEXERIL) 10 MG tablet    Sig: 1 po q hs prn    Dispense:  30 tablet    Refill:  0    Imaging: No results found.  PMFS History: Patient Active Problem List   Diagnosis Date Noted   Arthritis of left knee    S/P total knee arthroplasty, left 03/14/2022   Vitamin D deficiency 02/07/2022   Adjustment disorder with depressed mood 02/07/2022   Hyperlipidemia, unspecified 12/12/2021   Type 2 diabetes mellitus with  unspecified complications (HCC) 12/12/2021   Urge incontinence of urine 12/12/2021   Degeneration of lumbar intervertebral disc 12/12/2021   Halitosis 07/25/2021   Abdominal bloating 06/23/2021   Flatulence, eructation and gas pain 06/23/2021   Chronic pain 11/04/2020   Exacerbation of asthma 11/04/2020   Hyperglycemia due to type 2 diabetes mellitus (HCC) 11/04/2020   Mild intermittent asthma 11/04/2020   Intractable hiccups 11/10/2019   COVID-19 virus infection 11/10/2019   Morbid obesity (HCC) 12/12/2018   Acute pain of right shoulder 03/15/2017   Strain of right trapezius muscle 03/15/2017   Musculoskeletal pain 03/15/2017   OSA (obstructive sleep apnea) 08/23/2015   Sciatica 11/13/2012   Past Medical History:  Diagnosis Date   Arthritis    Asthma    Diabetes mellitus without complication (HCC)    type 2   Sleep apnea    pt states "one doctor told me to wear CPAP, another told me I didn't need to, so I do not wear a CPAP"    Family History  Problem Relation Age of Onset   Diabetes Mother    Hypertension Mother    Arthritis Mother    Diabetes Father    Kidney disease Father    Heart disease Maternal Grandmother  Diabetes Paternal Grandmother     Past Surgical History:  Procedure Laterality Date   ABDOMINAL HYSTERECTOMY     CATARACT EXTRACTION Bilateral 2018   HYSTERECTOMY ABDOMINAL WITH SALPINGECTOMY  1997   TOTAL KNEE ARTHROPLASTY Left 03/14/2022   Procedure: LEFT TOTAL KNEE ARTHROPLASTY;  Surgeon: Cammy Copa, MD;  Location: [facility];  Service: Orthopedics;  Laterality: Left;   Social History   Occupational History   Not on file  Tobacco Use   Smoking status: Former    Packs/day: 0.12    Years: 0.50    Total pack years: 0.06    Types: Cigarettes    Quit date: 2021    Years since quitting: 2.5   Smokeless tobacco: Never  Vaping Use   Vaping Use: Never used  Substance and Sexual Activity   Alcohol use: Not Currently   Drug use: No   Sexual  activity: Never

## 2022-06-09 ENCOUNTER — Telehealth: Payer: Self-pay | Admitting: Orthopedic Surgery

## 2022-06-09 NOTE — Telephone Encounter (Addendum)
Patient called asked if she can have her X-Ray before she goes back to work. Patient also said her employer asked if she can get a note stating any and all restrictions. Patient said  the note should read no standing for long periods of time. Patient asked to remain stationary as much as possible, no lifting over 15 pounds and no climbing ladders. Patient said she is still going to work 5 hour days for 2 weeks. Patient asked if the letter can be put in [place] and her employer will get it.  Patient also asked if the letter can be faxed to [facility] and also added in [place] for her employer?    The number to contact patient is [phone]

## 2022-06-09 NOTE — Telephone Encounter (Signed)
Ok for note no standing longer than 45 min at a time and less than 2 hours standing per 6 hour workday with the rest as stated thx

## 2022-06-09 NOTE — Telephone Encounter (Signed)
Note written will call for appt

## 2022-06-12 ENCOUNTER — Ambulatory Visit (INDEPENDENT_AMBULATORY_CARE_PROVIDER_SITE_OTHER): Payer: No Typology Code available for payment source

## 2022-06-12 ENCOUNTER — Telehealth: Payer: Self-pay | Admitting: Orthopedic Surgery

## 2022-06-12 ENCOUNTER — Ambulatory Visit: Payer: No Typology Code available for payment source | Admitting: Surgical

## 2022-06-12 DIAGNOSIS — Z96652 Presence of left artificial knee joint: Secondary | ICD-10-CM | POA: Diagnosis not present

## 2022-06-12 NOTE — Telephone Encounter (Signed)
[place] forms received. To [employer]. ?

## 2022-06-12 NOTE — Telephone Encounter (Signed)
scheduled

## 2022-06-14 ENCOUNTER — Telehealth: Payer: Self-pay | Admitting: Orthopedic Surgery

## 2022-06-14 ENCOUNTER — Telehealth: Payer: Self-pay | Admitting: Registered Nurse

## 2022-06-14 ENCOUNTER — Encounter: Payer: Self-pay | Admitting: Registered Nurse

## 2022-06-14 DIAGNOSIS — E559 Vitamin D deficiency, unspecified: Secondary | ICD-10-CM

## 2022-06-14 DIAGNOSIS — E118 Type 2 diabetes mellitus with unspecified complications: Secondary | ICD-10-CM

## 2022-06-14 DIAGNOSIS — Z96652 Presence of left artificial knee joint: Secondary | ICD-10-CM

## 2022-06-14 DIAGNOSIS — G5782 Other specified mononeuropathies of left lower limb: Secondary | ICD-10-CM

## 2022-06-14 MED ORDER — LIDOCAINE 4 % EX PTCH: 1.0000 | MEDICATED_PATCH | CUTANEOUS | 3 refills | Status: AC

## 2022-06-14 MED ORDER — CAPSAICIN 0.075 % EX CREA: 1.0000 | TOPICAL_CREAM | Freq: Two times a day (BID) | CUTANEOUS | 0 refills | Status: DC

## 2022-06-14 NOTE — Telephone Encounter (Signed)
HR Tonya asked for patient new work restrictions note from orthopedics.  Printed and given to HR Tonya 06/13/22  [facility] [place] [address] [place], [place]  [address] Phone:  [phone]   Fax:  [phone]   June 09, 2022    Patient: Stephanie Chandler  Date of Birth: 1966-01-24  Date of Visit: 06/09/2022      To Whom It May Concern:   It is my medical opinion that Stephanie Chandler should avoid standing longer than 45 min at a time with max standing less than 2 hours a day.  Patient should work 5 hours a day only and should avoid lifting greater than 15lbs and should not climb ladders.     If you have any questions or concerns, please don't hesitate to call.   Sincerely,       Dr. Dorene Grebe    [facility] [place], [phone]                       1

## 2022-06-14 NOTE — Telephone Encounter (Signed)
Patient contacted via telephone follow up physical therapy progress/estimated RTW.  Patient reported scheduled to RTW 25 Jul with restrictions.  See note from orthopedics in Epic dated today.  Patient asked that I print letter and give to [facility] HR tomorrow when onsite.  Patient reported still wearing sleeve on knee as having sensitivity around incision site if clothes rubbing on incision even though incision healed ?nerve pain.  On gabapentin and helping some.  Her pharmacy stated didn't received Rx for capsaicin cream or lidocaine patches in June so she was unable to pick up and trial.  Patient would like Rx resent to her pharmacy today.  Done electronically.  Notified patient to contact me if pharmacy again states no transmission received as transmission receipt received by epic 21 Jun for both Rx.  I would then call and speak with pharmacy staff regarding Rxs.  Patient reported having intermittent leg swelling at night when sleeping.  She wears knee sleeve due to skin sensitivity rubbing on fabric.  Has noticed sometimes lower leg swelled and other nights not.  She does tend to sleep with other leg stacked on affected leg.  Consider pillow between legs, trial sleeping without knee sleeve when sleeping.  Monitor for sheets/bedding wrapping around leg.  She has discussed this with PT and orthopedics provider at prior visits.  Frustrated still occurring.  Discussed with patient operative site swelling intermittent not uncommon and can occur for weeks to months continue ice/compression/AROM.  If rash red/hot/worsening pain/signs of infection to notify her provider/seek evaluation.  Patient tired of not being able to work and looking forward to returning to work next week.  Discussed with patient nerve pain typically improves over time.  Patient stated she was told may take up to 8-12 months.  Patient verbalized understanding information/instructions, agreed with plan of care and had no further questions at this  time.

## 2022-06-14 NOTE — Telephone Encounter (Signed)
New note created for patient. IC and advised patient this was done and should be able to access through mychart.

## 2022-06-14 NOTE — Telephone Encounter (Signed)
Epic reviewed noted patient has requested new work note from orthopedics today.  Message left for patient to contact me.  Verified if she wants me to give HR Tonya new note when I am onsite tomorrow.  Also calling to check in regarding her PT progress/symptoms.  Noted that orthopedics wrote that [facility] paperwork completed this week.  Patient reported that she was never able to pick up lidocaine patches or hot pepper cream.  Sensitivity persists skin over surgical site but if wearing sleeve and taking gabapentin helps.  Patient working with her physical therapist also.  Patient returned call almost immediately and asked that I print and give note to HR Tonya.  June 14, 2022   Patient: Stephanie Chandler  Date of Birth: Feb 25, 1966  Date of Visit: 06/14/2022    To Whom It May Concern:  It is my medical opinion that Stephanie Chandler may return to work on 06/20/22.  She should avoid standing longer than 45 min at a time with max standing less than 2 hours a day.  Patient should work 6 hours a day only and should avoid lifting greater than 15lbs and should not climb ladders  If you have any questions or concerns, please don't hesitate to call.  Sincerely,    Prescott Parma, RT

## 2022-06-14 NOTE — Telephone Encounter (Signed)
Patient called asked if her work note can be revised for her employer. Patient said the last note did not have the return date on it which would be 06/20/2022. Patient said the note should have her working 6 hours a day. Patient asked if she could get a call back when note is ready The number to contact patient is [phone]

## 2022-06-16 ENCOUNTER — Telehealth: Payer: Self-pay | Admitting: Orthopedic Surgery

## 2022-06-16 NOTE — Telephone Encounter (Signed)
Sent new note per [facility]. Patient can access via mychart.

## 2022-06-16 NOTE — Telephone Encounter (Signed)
Pt called requesting an updated letter for employer. Pt states that the letter given is great but her employer is asking for a revised letter with how long pt need to be on restricted work duty. Please revise letter and call pt when ready for pick up. Pt phone number is [phone].

## 2022-06-19 ENCOUNTER — Encounter: Payer: Self-pay | Admitting: Orthopedic Surgery

## 2022-06-19 MED ORDER — CELECOXIB 100 MG PO CAPS: 100.0000 mg | ORAL_CAPSULE | Freq: Two times a day (BID) | ORAL | 0 refills | Status: DC

## 2022-06-19 MED ORDER — TRAMADOL HCL 50 MG PO TABS: 50.0000 mg | ORAL_TABLET | Freq: Every evening | ORAL | 0 refills | Status: AC | PRN

## 2022-06-19 NOTE — Progress Notes (Signed)
Post-Op Visit Note   Patient: Stephanie Chandler           Date of Birth: Jun 20, 1966           MRN: [id number] Visit Date: 06/12/2022 PCP: Aliene Beams, MD   Assessment & Plan:  Chief Complaint:  Chief Complaint  Patient presents with   Left Knee - Follow-up    Patient request appointment to have x-rays done before returning to work   Visit Diagnoses:  1. S/P total knee arthroplasty, left     Plan: Patient is a 56 year old female who presents s/p left total knee arthroplasty on 03/14/2022.  She had intraoperative fracture at the time that was well fixed with plate fixation.  She comes in today as she would like x-rays before she returns to work.  Radiographs taken today demonstrate no significant change in position of the implant or any displacement at the intraoperative fracture site.  She is overall doing well and pain is slowly improving.  She reports she is plan on returning to work for 5 hours/day for 3 weeks before return to full duty.  She would like refill of tramadol and she would like to try Celebrex as Relafen gave her reflux issues.  Left knee looks good on exam today with 2 degrees extension and about 115 degrees of knee flexion.  Incision is well-healed.  Medications refilled and cautioned against taking tramadol while driving or working.  She will only take this at night to help with sleeping.  This should be the last refill of tramadol.  Follow-up in 6 to 8 weeks for final check with Dr. August Saucer.  Follow-Up Instructions: No follow-ups on file.   Orders:  Orders Placed This Encounter  Procedures   XR Knee 1-2 Views Left   No orders of the defined types were placed in this encounter.   Imaging: No results found.  PMFS History: Patient Active Problem List   Diagnosis Date Noted   Arthritis of left knee    S/P total knee arthroplasty, left 03/14/2022   Vitamin D deficiency 02/07/2022   Adjustment disorder with depressed mood 02/07/2022   Hyperlipidemia, unspecified  12/12/2021   Type 2 diabetes mellitus with unspecified complications (HCC) 12/12/2021   Urge incontinence of urine 12/12/2021   Degeneration of lumbar intervertebral disc 12/12/2021   Halitosis 07/25/2021   Abdominal bloating 06/23/2021   Flatulence, eructation and gas pain 06/23/2021   Chronic pain 11/04/2020   Exacerbation of asthma 11/04/2020   Hyperglycemia due to type 2 diabetes mellitus (HCC) 11/04/2020   Mild intermittent asthma 11/04/2020   Intractable hiccups 11/10/2019   COVID-19 virus infection 11/10/2019   Morbid obesity (HCC) 12/12/2018   Acute pain of right shoulder 03/15/2017   Strain of right trapezius muscle 03/15/2017   Musculoskeletal pain 03/15/2017   OSA (obstructive sleep apnea) 08/23/2015   Sciatica 11/13/2012   Past Medical History:  Diagnosis Date   Arthritis    Asthma    Diabetes mellitus without complication (HCC)    type 2   Sleep apnea    pt states "one doctor told me to wear CPAP, another told me I didn't need to, so I do not wear a CPAP"    Family History  Problem Relation Age of Onset   Diabetes Mother    Hypertension Mother    Arthritis Mother    Diabetes Father    Kidney disease Father    Heart disease Maternal Grandmother    Diabetes Paternal Grandmother  Past Surgical History:  Procedure Laterality Date   ABDOMINAL HYSTERECTOMY     CATARACT EXTRACTION Bilateral 2018   HYSTERECTOMY ABDOMINAL WITH SALPINGECTOMY  1997   TOTAL KNEE ARTHROPLASTY Left 03/14/2022   Procedure: LEFT TOTAL KNEE ARTHROPLASTY;  Surgeon: Cammy Copa, MD;  Location: [facility] OR;  Service: Orthopedics;  Laterality: Left;   Social History   Occupational History   Not on file  Tobacco Use   Smoking status: Former    Packs/day: 0.12    Years: 0.50    Total pack years: 0.06    Types: Cigarettes    Quit date: 2021    Years since quitting: 2.5   Smokeless tobacco: Never  Vaping Use   Vaping Use: Never used  Substance and Sexual Activity   Alcohol use:  Not Currently   Drug use: No   Sexual activity: Never

## 2022-06-20 ENCOUNTER — Telehealth: Payer: Self-pay | Admitting: Orthopedic Surgery

## 2022-06-20 NOTE — Telephone Encounter (Signed)
Spoke with pharmacy staff and stated they will not fill lidocaine patch or capsaicin cream because OTC. Discussed with patient I was not aware both were available OTC.   Patches available/in stock at their location but capsaicin was not and to try [place] [facility] for OTC purchase per pharmacy staff.  Patient notified and will check with [facility] and other [facility] location today and notify me if further questions or concerns.  Patient has active Rx that can be transferred to another pharmacy if needed to fill with FSA card.  Patient verbalized understanding information/instructions, agreed with plan of care and had no further questions at this time.

## 2022-06-20 NOTE — Telephone Encounter (Signed)
[place] forms received. To [employer]. ?

## 2022-06-20 NOTE — Telephone Encounter (Signed)
Patient stated returned to work as expected and going well.  Denied concerns.  Rx I sent electronically for capsaicin cream and lidocaine patches not received again per pharmacy staff.  Discussed with patient I would call pharmacy today and discuss with them as I received transmission confirmation message in epic again.  Patient stated sleep at night still interrupted due to pain.  Taking gabapentin and nabumetone 500mg  2 tabs po BID working the best for her.  Wondering if she can increase to 2000mg  po BID or take 1 every 6 hours as she can tell when it is wearing off.  Discussed with patient trial taking 1 tab every 6 hours and see if that dosing schedule works better for her as half life of medication 24 hours per Epocrates  She has noticed a worsening of heartburn symptoms and had stopped her omeprazole.  Encouraged patient to take NSAID with food and take her omeprazole.  Patient stated she would restart when she returned home today.  Verified with patient she is not taking any other NSAID e.g. advil/motrin/ibuprofen/aleve/naproxen/naprosyn/diclofenac/voltaren/mobic/meloxicam.  Reminded patient due to repeat Hgba1c nonfasting and to scheduled with RN .  Patient would like to still trial lidocaine patch or capsaicin cream.  Discussed with contact her again after I speak with pharmacy staff.  Patient observed in her work area, gait sure and steady.  Patient verbalized understanding information/instructions, agreed with plan of care and had no further questions at this time.

## 2022-06-22 NOTE — Telephone Encounter (Signed)
Patient seen in workcenter.  Gait sure and steady skin warm dry and pink.  A&Ox3 respirations even and unlabored RA.  Patient reported she is taking nabumetone 500mg  1 tab every 6 hours instead of 2 tabs every 12 hours and that is working better to control her pain.  She did not go to pharmacy to pick up capsaicin or lidocaine patches yet.  Denied further questions or concerns at this time.

## 2022-06-27 ENCOUNTER — Other Ambulatory Visit: Payer: Self-pay

## 2022-06-27 NOTE — Telephone Encounter (Signed)
Patient had tiger top tube drawn and lavender tube being filled by RN Stone when vein blew.  RN attempted venipuncture x 1 and NP attempted venipuncture x1 right hand but unable to obtain enough specimen in lavender tube for Hgba1c/CBC.  Patient stated she is going to eat lunch/hydrate and return after her work shift 1430 to have another attempt blood draw for lavender tube with RN Larina Bras today.

## 2022-06-27 NOTE — Telephone Encounter (Signed)
Patient contacted will come to clinic today for nonfasting labs.  She has order from [facility] for CMET, CBC and Hgba1c nonfasting also.  Vitamin D needs to be drawn from my order.  RN Rosalita Chessman notified and will draw and send to [facility] today.  Patient also stated her orthopedics changed her pain medication to celecoxib today.  Discussed with patient do not take voltaren/diclofenac gel/oral, advil/aleve/naproxen/naprosyn/ibuprofen/motrin/meloxicam/mobic while taking celecoxib.  Patient stated she is going to trial new medication this week to see if works better for sleeping at night.  Gait sure and steady in workcenter.  Patient verbalized understanding information/instructions, agreed with plan of care and had no further questions at this time.

## 2022-06-28 ENCOUNTER — Ambulatory Visit: Payer: PRIVATE HEALTH INSURANCE | Admitting: Orthopedic Surgery

## 2022-06-28 LAB — COMPREHENSIVE METABOLIC PANEL
ALT: 18 IU/L (ref 0–32)
AST: 22 IU/L (ref 0–40)
Albumin/Globulin Ratio: 1.6 (ref 1.2–2.2)
Albumin: 4.1 g/dL (ref 3.8–4.9)
Alkaline Phosphatase: 77 IU/L (ref 44–121)
BUN/Creatinine Ratio: 13 (ref 9–23)
BUN: 10 mg/dL (ref 6–24)
Bilirubin Total: <0.2 mg/dL (ref 0.0–1.2)
CO2: 22 mmol/L (ref 20–29)
Calcium: 9.4 mg/dL (ref 8.7–10.2)
Chloride: 104 mmol/L (ref 96–106)
Creatinine, Ser: 0.79 mg/dL (ref 0.57–1.00)
Globulin, Total: 2.6 g/dL (ref 1.5–4.5)
Glucose: 101 mg/dL — ABNORMAL HIGH (ref 70–99)
Potassium: 4.2 mmol/L (ref 3.5–5.2)
Sodium: 142 mmol/L (ref 134–144)
Total Protein: 6.7 g/dL (ref 6.0–8.5)
eGFR: 87 mL/min/{1.73_m2} (ref >59)

## 2022-06-28 LAB — LIPID PANEL W/O CHOL/HDL RATIO
Cholesterol, Total: 176 mg/dL (ref 100–199)
HDL: 43 mg/dL (ref >39)
LDL Chol Calc (NIH): 115 mg/dL — ABNORMAL HIGH (ref 0–99)
Triglycerides: 98 mg/dL (ref 0–149)
VLDL Cholesterol Cal: 18 mg/dL (ref 5–40)

## 2022-06-28 LAB — VITAMIN D 25 HYDROXY (VIT D DEFICIENCY, FRACTURES): Vit D, 25-Hydroxy: 39.6 ng/mL (ref 30.0–100.0)

## 2022-07-04 ENCOUNTER — Other Ambulatory Visit: Payer: Self-pay | Admitting: Registered Nurse

## 2022-07-04 ENCOUNTER — Telehealth: Payer: Self-pay | Admitting: Orthopedic Surgery

## 2022-07-04 NOTE — Telephone Encounter (Signed)
Ok by me thx !

## 2022-07-04 NOTE — Telephone Encounter (Signed)
Pt called requesting an updated letter for employer be sent to her mychart. Pt would like to go back to work full time but still with her restrictions. Please send to pt mychart. Pt phone number is [phone].

## 2022-07-05 ENCOUNTER — Telehealth: Payer: Self-pay | Admitting: Orthopedic Surgery

## 2022-07-05 LAB — HGB A1C W/O EAG: Hgb A1c MFr Bld: 6 % — ABNORMAL HIGH (ref 4.8–5.6)

## 2022-07-05 NOTE — Telephone Encounter (Signed)
Noted. Will call patient. See other note.

## 2022-07-05 NOTE — Telephone Encounter (Signed)
My chart message sent to patient reviewed results in my chart portal Hgba1c 6.0 previous 6.03 Jan 2022 per Epic review.  Repeat Hgba1c in 3 to 6 months nonfasting recommended.  RN Reece Packer notified to fax results to [facility] tomorrow when clinic reopens.

## 2022-07-05 NOTE — Telephone Encounter (Signed)
Venipuncture x 1 right ac by RN Reece Packer obtained serum for Hgba1c sent to [facility].  Discussed with patient will have results typically tomorrow.  Cobain and gauze applied to venipuncture site no bleeding/ecchymosis/swelling on discharge ambulatory from clinic.  Dressing CDI patient notified may remove in 15 minutes.   patient A&Ox3 gait sure and steady respirations even and unlabored RA.  Patient verbalized understanding information and had no further questions at that time.

## 2022-07-05 NOTE — Telephone Encounter (Signed)
Pt returned call to Lauren F. Please call pt at [phone]

## 2022-07-05 NOTE — Telephone Encounter (Signed)
IC LMVM for patient to call me back to discuss. Additional details needed.

## 2022-07-06 ENCOUNTER — Telehealth: Payer: Self-pay | Admitting: Orthopedic Surgery

## 2022-07-06 NOTE — Telephone Encounter (Signed)
done

## 2022-07-06 NOTE — Telephone Encounter (Signed)
Patient returned call  advised the note should read that she can return to work 8 hours a day with restrictions.  Patient will start working 8 hours beginning Monday 07/10/2022.  The restrictions are no climbing ladders, no standing over 2 hours at a time, no lifting over 15 Lbs.  Until her doctors visit on 08/07/2022. Patient asked if the letter can be put in [place].The number to contact patient is [phone]

## 2022-07-06 NOTE — Telephone Encounter (Signed)
IC again-no answer. LMVM for patient to call me back. Need specifics for work note.

## 2022-07-09 NOTE — Telephone Encounter (Signed)
Per Epic review patient reviewed my chart message 07/05/22.  RN Reece Packer faxed results to [facility] on 07/06/22

## 2022-07-16 ENCOUNTER — Other Ambulatory Visit: Payer: Self-pay | Admitting: Surgical

## 2022-07-19 ENCOUNTER — Telehealth: Payer: Self-pay | Admitting: Registered Nurse

## 2022-07-19 NOTE — Telephone Encounter (Signed)
Latest Reference Range & Units 06/27/22 00:00 07/04/22 10:30  COMPREHENSIVE METABOLIC PANEL  Rpt !   Sodium 134 - 144 mmol/L 142   Potassium 3.5 - 5.2 mmol/L 4.2   Chloride 96 - 106 mmol/L 104   CO2 20 - 29 mmol/L 22   Glucose 70 - 99 mg/dL 101 (H)   BUN 6 - 24 mg/dL 10   Creatinine 0.57 - 1.00 mg/dL 0.79   Calcium 8.7 - 10.2 mg/dL 9.4   BUN/Creatinine Ratio 9 - 23  13   eGFR >59 mL/min/1.73 87   Alkaline Phosphatase 44 - 121 IU/L 77   Albumin 3.8 - 4.9 g/dL 4.1   Albumin/Globulin Ratio 1.2 - 2.2  1.6   AST 0 - 40 IU/L 22   ALT 0 - 32 IU/L 18   Total Protein 6.0 - 8.5 g/dL 6.7   Total Bilirubin 0.0 - 1.2 mg/dL <0.2   Cholesterol, Total 100 - 199 mg/dL 176   HDL Cholesterol >39 mg/dL 43   Triglycerides 0 - 149 mg/dL 98   VLDL Cholesterol Cal 5 - 40 mg/dL 18   LDL Chol Calc (NIH) 0 - 99 mg/dL 115 (H)   Vitamin D, 25-Hydroxy 30.0 - 100.0 ng/mL 39.6   Globulin, Total 1.5 - 4.5 g/dL 2.6   Hemoglobin A1C 4.8 - 5.6 %  6.0 (H)  !: Data is abnormal (H): Data is abnormally high Rpt: View report in Results Review for more information

## 2022-07-21 NOTE — Telephone Encounter (Signed)
Spoke with patient via telephone verbalized understanding results faxed to Dr Malena Peer office yesterday.  Patient had no further questions or concerns at this time.

## 2022-07-22 NOTE — Telephone Encounter (Signed)
Patient notified via telephone 07/21/22 results faxed to Dr Tracie Harrier by RN Larina Bras on 07/20/22  Patient had no further questions regarding test results at this time.

## 2022-07-26 ENCOUNTER — Ambulatory Visit (INDEPENDENT_AMBULATORY_CARE_PROVIDER_SITE_OTHER): Payer: No Typology Code available for payment source | Admitting: Orthopedic Surgery

## 2022-07-26 ENCOUNTER — Encounter: Payer: Self-pay | Admitting: Orthopedic Surgery

## 2022-07-26 DIAGNOSIS — M545 Low back pain, unspecified: Secondary | ICD-10-CM

## 2022-07-26 NOTE — Progress Notes (Signed)
Post-Op Visit Note   Patient: Stephanie Chandler           Date of Birth: 09/03/1966           MRN: [id number] Visit Date: 07/26/2022 PCP: Aliene Beams, MD   Assessment & Plan:  Chief Complaint:  Chief Complaint  Patient presents with   Left Knee - Routine Post Op   Visit Diagnoses:  1. Low back pain, unspecified back pain laterality, unspecified chronicity, unspecified whether sciatica present     Plan: Stephanie Chandler is a 56 year old patient is about 4 months out left total knee replacement.  Still having some pain after prolonged standing and walking.  She has been back to work and she has been able to sit down at times.  She is doing light duty type work.  Taking Neurontin and Celebrex and Tylenol.  Ice helps her pain.  Primarily lateral sided pain.  Having some back pain as well and there is a nerve component to the burning pain she is having in the left leg.  Denies any fevers and chills.  Getting ready to start going back to the gym.  She does wear a knee brace because when close touch her knee she has a lot of uncomfortable tingling.  On examination she has trace effusion.  Good range of motion.  Good stability to varus and valgus stress.  Good ankle dorsiflexion and toe dorsiflexion strength.  No warmth to the left knee.  No nerve root tension signs.  Radiographs from over a year ago show good alignment of the lumbar spine with no spondylolisthesis or compression fractures.  Plan at this time is continue light duty work at work for the next 6 weeks.  6-week return with repeat radiographs.  Radiographs from mid July looked good.  Continue with physical therapy.  Follow-Up Instructions: No follow-ups on file.   Orders:  Orders Placed This Encounter  Procedures   MR Lumbar Spine w/o contrast   No orders of the defined types were placed in this encounter.   Imaging: No results found.  PMFS History: Patient Active Problem List   Diagnosis Date Noted   Arthritis of left knee    S/P  total knee arthroplasty, left 03/14/2022   Vitamin D deficiency 02/07/2022   Adjustment disorder with depressed mood 02/07/2022   Hyperlipidemia, unspecified 12/12/2021   Type 2 diabetes mellitus with unspecified complications (HCC) 12/12/2021   Urge incontinence of urine 12/12/2021   Degeneration of lumbar intervertebral disc 12/12/2021   Halitosis 07/25/2021   Abdominal bloating 06/23/2021   Flatulence, eructation and gas pain 06/23/2021   Chronic pain 11/04/2020   Exacerbation of asthma 11/04/2020   Hyperglycemia due to type 2 diabetes mellitus (HCC) 11/04/2020   Mild intermittent asthma 11/04/2020   Intractable hiccups 11/10/2019   COVID-19 virus infection 11/10/2019   Morbid obesity (HCC) 12/12/2018   Acute pain of right shoulder 03/15/2017   Strain of right trapezius muscle 03/15/2017   Musculoskeletal pain 03/15/2017   OSA (obstructive sleep apnea) 08/23/2015   Sciatica 11/13/2012   Past Medical History:  Diagnosis Date   Arthritis    Asthma    Diabetes mellitus without complication (HCC)    type 2   Sleep apnea    pt states "one doctor told me to wear CPAP, another told me I didn't need to, so I do not wear a CPAP"    Family History  Problem Relation Age of Onset   Diabetes Mother    Hypertension Mother  Arthritis Mother    Diabetes Father    Kidney disease Father    Heart disease Maternal Grandmother    Diabetes Paternal Grandmother     Past Surgical History:  Procedure Laterality Date   ABDOMINAL HYSTERECTOMY     CATARACT EXTRACTION Bilateral 2018   HYSTERECTOMY ABDOMINAL WITH SALPINGECTOMY  1997   TOTAL KNEE ARTHROPLASTY Left 03/14/2022   Procedure: LEFT TOTAL KNEE ARTHROPLASTY;  Surgeon: Cammy Copa, MD;  Location: [facility];  Service: Orthopedics;  Laterality: Left;   Social History   Occupational History   Not on file  Tobacco Use   Smoking status: Former    Packs/day: 0.12    Years: 0.50    Total pack years: 0.06    Types: Cigarettes     Quit date: 2021    Years since quitting: 2.6   Smokeless tobacco: Never  Vaping Use   Vaping Use: Never used  Substance and Sexual Activity   Alcohol use: Not Currently   Drug use: No   Sexual activity: Never

## 2022-08-07 ENCOUNTER — Other Ambulatory Visit: Payer: Self-pay | Admitting: Surgical

## 2022-08-08 ENCOUNTER — Other Ambulatory Visit: Payer: No Typology Code available for payment source

## 2022-08-09 ENCOUNTER — Telehealth: Payer: Self-pay | Admitting: Registered Nurse

## 2022-08-09 ENCOUNTER — Encounter: Payer: Self-pay | Admitting: Registered Nurse

## 2022-08-09 DIAGNOSIS — E118 Type 2 diabetes mellitus with unspecified complications: Secondary | ICD-10-CM

## 2022-08-09 NOTE — Telephone Encounter (Signed)
Latest Reference Range & Units 06/27/22 00:00 07/04/22 10:30  COMPREHENSIVE METABOLIC PANEL  Rpt !   Sodium 134 - 144 mmol/L 142   Potassium 3.5 - 5.2 mmol/L 4.2   Chloride 96 - 106 mmol/L 104   CO2 20 - 29 mmol/L 22   Glucose 70 - 99 mg/dL 101 (H)   BUN 6 - 24 mg/dL 10   Creatinine 0.57 - 1.00 mg/dL 0.79   Calcium 8.7 - 10.2 mg/dL 9.4   BUN/Creatinine Ratio 9 - 23  13   eGFR >59 mL/min/1.73 87   Alkaline Phosphatase 44 - 121 IU/L 77   Albumin 3.8 - 4.9 g/dL 4.1   Albumin/Globulin Ratio 1.2 - 2.2  1.6   AST 0 - 40 IU/L 22   ALT 0 - 32 IU/L 18   Total Protein 6.0 - 8.5 g/dL 6.7   Total Bilirubin 0.0 - 1.2 mg/dL <0.2   Cholesterol, Total 100 - 199 mg/dL 176   HDL Cholesterol >39 mg/dL 43   Triglycerides 0 - 149 mg/dL 98   VLDL Cholesterol Cal 5 - 40 mg/dL 18   LDL Chol Calc (NIH) 0 - 99 mg/dL 115 (H)   Vitamin D, 25-Hydroxy 30.0 - 100.0 ng/mL 39.6   Globulin, Total 1.5 - 4.5 g/dL 2.6   Hemoglobin A1C 4.8 - 5.6 %  6.0 (H)  !: Data is abnormal (H): Data is abnormally high Rpt: View report in Results Review for more information   Latest Reference Range & Units 09/13/21 12:05 01/17/22 11:18  Hemoglobin A1C 4.8 - 5.6 % 6.2 (H) 6.7 (H)  (H): Data is abnormally high  Patient requested NP advice on ozempic use.  "I am not losing weight like I think I should on this medication"  Patient reported trying to eat out fewer times per week.  Not limiting any food groups.  Trying to make healthier choices at home.  Has not adjusted any portion sizes.  Exercise for leg strengthening s/p surgery.  "My clothes fitting a bit looser"  "Scale not showing much movement though"  "Hgba1c improved."  Encouraged patient to continue healthy lifestyle choices regarding 150 minutes activity per week; decreasing added sugars in diet, consider keeping food log a day a week to see trends in intake/meeting with [facility] dietitian.  Encouraged patient to continue ozempic as Hgba1c improved to 6.0 in August.  Discussed  1/2 pound per week loss is my recommended weight loss goal per week.  Personally I found food log, daily walks and at least weekly weight checks once goal met/daily during weight loss period and decreasing portion sizes/eating out helped with my weight loss efforts 50lbs and to sustain for the past 6 years.  Patient verbalized understanding information/instructions and stated will continue decreasing eating out and riding bike for leg strength at this time.  Patient verbalized understanding that improved Hgba1c is beneficial for her health/avoiding nerve/organ damage from diabetes.  Patient had no further questions at this time.

## 2022-08-14 ENCOUNTER — Other Ambulatory Visit: Payer: Self-pay | Admitting: Surgical

## 2022-08-22 ENCOUNTER — Ambulatory Visit: Payer: Self-pay | Admitting: Registered Nurse

## 2022-08-22 ENCOUNTER — Encounter: Payer: Self-pay | Admitting: Registered Nurse

## 2022-08-22 VITALS — Resp 16 | Ht 60.0 in | Wt 200.0 lb

## 2022-08-22 DIAGNOSIS — Z6839 Body mass index (BMI) 39.0-39.9, adult: Secondary | ICD-10-CM

## 2022-08-22 NOTE — Patient Instructions (Signed)
Calorie Counting for Weight Loss Calories are units of energy. Your body needs a certain number of calories from food to keep going throughout the day. When you eat or drink more calories than your body needs, your body stores the extra calories mostly as fat. When you eat or drink fewer calories than your body needs, your body burns fat to get the energy it needs. Calorie counting means keeping track of how many calories you eat and drink each day. Calorie counting can be helpful if you need to lose weight. If you eat fewer calories than your body needs, you should lose weight. Ask your health care provider what a healthy weight is for you. For calorie counting to work, you will need to eat the right number of calories each day to lose a healthy amount of weight per week. A dietitian can help you figure out how many calories you need in a day and will suggest ways to reach your calorie goal. A healthy amount of weight to lose each week is usually 1-2 lb (0.5-0.9 kg). This usually means that your daily calorie intake should be reduced by 500-750 calories. Eating 1,200-1,500 calories a day can help most women lose weight. Eating 1,500-1,800 calories a day can help most men lose weight. What do I need to know about calorie counting? Work with your health care provider or dietitian to determine how many calories you should get each day. To meet your daily calorie goal, you will need to: Find out how many calories are in each food that you would like to eat. Try to do this before you eat. Decide how much of the food you plan to eat. Keep a food log. Do this by writing down what you ate and how many calories it had. To successfully lose weight, it is important to balance calorie counting with a healthy lifestyle that includes regular activity. Where do I find calorie information?  The number of calories in a food can be found on a Nutrition Facts label. If a food does not have a Nutrition Facts label, try  to look up the calories online or ask your dietitian for help. Remember that calories are listed per serving. If you choose to have more than one serving of a food, you will have to multiply the calories per serving by the number of servings you plan to eat. For example, the label on a package of bread might say that a serving size is 1 slice and that there are 90 calories in a serving. If you eat 1 slice, you will have eaten 90 calories. If you eat 2 slices, you will have eaten 180 calories. How do I keep a food log? After each time that you eat, record the following in your food log as soon as possible: What you ate. Be sure to include toppings, sauces, and other extras on the food. How much you ate. This can be measured in cups, ounces, or number of items. How many calories were in each food and drink. The total number of calories in the food you ate. Keep your food log near you, such as in a pocket-sized notebook or on an app or website on your mobile phone. Some programs will calculate calories for you and show you how many calories you have left to meet your daily goal. What are some portion-control tips? Know how many calories are in a serving. This will help you know how many servings you can have of a certain   food. Use a measuring cup to measure serving sizes. You could also try weighing out portions on a kitchen scale. With time, you will be able to estimate serving sizes for some foods. Take time to put servings of different foods on your favorite plates or in your favorite bowls and cups so you know what a serving looks like. Try not to eat straight from a food's packaging, such as from a bag or box. Eating straight from the package makes it hard to see how much you are eating and can lead to overeating. Put the amount you would like to eat in a cup or on a plate to make sure you are eating the right portion. Use smaller plates, glasses, and bowls for smaller portions and to prevent  overeating. Try not to multitask. For example, avoid watching TV or using your computer while eating. If it is time to eat, sit down at a table and enjoy your food. This will help you recognize when you are full. It will also help you be more mindful of what and how much you are eating. What are tips for following this plan? Reading food labels Check the calorie count compared with the serving size. The serving size may be smaller than what you are used to eating. Check the source of the calories. Try to choose foods that are high in protein, fiber, and vitamins, and low in saturated fat, trans fat, and sodium. Shopping Read nutrition labels while you shop. This will help you make healthy decisions about which foods to buy. Pay attention to nutrition labels for low-fat or fat-free foods. These foods sometimes have the same number of calories or more calories than the full-fat versions. They also often have added sugar, starch, or salt to make up for flavor that was removed with the fat. Make a grocery list of lower-calorie foods and stick to it. Cooking Try to cook your favorite foods in a healthier way. For example, try baking instead of frying. Use low-fat dairy products. Meal planning Use more fruits and vegetables. One-half of your plate should be fruits and vegetables. Include lean proteins, such as chicken, turkey, and fish. Lifestyle Each week, aim to do one of the following: 150 minutes of moderate exercise, such as walking. 75 minutes of vigorous exercise, such as running. General information Know how many calories are in the foods you eat most often. This will help you calculate calorie counts faster. Find a way of tracking calories that works for you. Get creative. Try different apps or programs if writing down calories does not work for you. What foods should I eat?  Eat nutritious foods. It is better to have a nutritious, high-calorie food, such as an avocado, than a food with  few nutrients, such as a bag of potato chips. Use your calories on foods and drinks that will fill you up and will not leave you hungry soon after eating. Examples of foods that fill you up are nuts and nut butters, vegetables, lean proteins, and high-fiber foods such as whole grains. High-fiber foods are foods with more than 5 g of fiber per serving. Pay attention to calories in drinks. Low-calorie drinks include water and unsweetened drinks. The items listed above may not be a complete list of foods and beverages you can eat. Contact a dietitian for more information. What foods should I limit? Limit foods or drinks that are not good sources of vitamins, minerals, or protein or that are high in unhealthy fats. These   include: Candy. Other sweets. Sodas, specialty coffee drinks, alcohol, and juice. The items listed above may not be a complete list of foods and beverages you should avoid. Contact a dietitian for more information. How do I count calories when eating out? Pay attention to portions. Often, portions are much larger when eating out. Try these tips to keep portions smaller: Consider sharing a meal instead of getting your own. If you get your own meal, eat only half of it. Before you start eating, ask for a container and put half of your meal into it. When available, consider ordering smaller portions from the menu instead of full portions. Pay attention to your food and drink choices. Knowing the way food is cooked and what is included with the meal can help you eat fewer calories. If calories are listed on the menu, choose the lower-calorie options. Choose dishes that include vegetables, fruits, whole grains, low-fat dairy products, and lean proteins. Choose items that are boiled, broiled, grilled, or steamed. Avoid items that are buttered, battered, fried, or served with cream sauce. Items labeled as crispy are usually fried, unless stated otherwise. Choose water, low-fat milk,  unsweetened iced tea, or other drinks without added sugar. If you want an alcoholic beverage, choose a lower-calorie option, such as a glass of wine or light beer. Ask for dressings, sauces, and syrups on the side. These are usually high in calories, so you should limit the amount you eat. If you want a salad, choose a garden salad and ask for grilled meats. Avoid extra toppings such as bacon, cheese, or fried items. Ask for the dressing on the side, or ask for olive oil and vinegar or lemon to use as dressing. Estimate how many servings of a food you are given. Knowing serving sizes will help you be aware of how much food you are eating at restaurants. Where to find more information Centers for Disease Control and Prevention: www.cdc.gov U.S. Department of Agriculture: myplate.gov Summary Calorie counting means keeping track of how many calories you eat and drink each day. If you eat fewer calories than your body needs, you should lose weight. A healthy amount of weight to lose per week is usually 1-2 lb (0.5-0.9 kg). This usually means reducing your daily calorie intake by 500-750 calories. The number of calories in a food can be found on a Nutrition Facts label. If a food does not have a Nutrition Facts label, try to look up the calories online or ask your dietitian for help. Use smaller plates, glasses, and bowls for smaller portions and to prevent overeating. Use your calories on foods and drinks that will fill you up and not leave you hungry shortly after a meal. This information is not intended to replace advice given to you by your health care provider. Make sure you discuss any questions you have with your health care provider. Document Revised: 12/25/2019 Document Reviewed: 12/25/2019 Elsevier Patient Education  2023 Elsevier Inc.  

## 2022-08-22 NOTE — Progress Notes (Signed)
Reviewed patient paper chart [facility] [employer] and Epic today with her.  Highest weight in the past year on [employer] scale 215lbs.  Weighed on [employer] scale with shoes and normal clothes on pockets emptied.

## 2022-08-28 ENCOUNTER — Telehealth: Payer: Self-pay | Admitting: Orthopedic Surgery

## 2022-08-28 NOTE — Telephone Encounter (Signed)
Patient called. Says she has a dental procedure 10/23/2022. Will need medication called in for her. Her call back number is [phone]

## 2022-08-29 ENCOUNTER — Ambulatory Visit: Payer: Self-pay

## 2022-08-29 DIAGNOSIS — Z23 Encounter for immunization: Secondary | ICD-10-CM

## 2022-08-29 MED ORDER — AMOXICILLIN 500 MG PO TABS
ORAL_TABLET | ORAL | 0 refills | Status: DC
Start: 2022-08-29 — End: 2023-05-01

## 2022-08-29 NOTE — Telephone Encounter (Signed)
[facility] LMVM advising submitted °

## 2022-08-30 ENCOUNTER — Encounter: Payer: Self-pay | Admitting: Registered Nurse

## 2022-08-30 ENCOUNTER — Telehealth: Payer: Self-pay | Admitting: Registered Nurse

## 2022-08-30 DIAGNOSIS — Z96652 Presence of left artificial knee joint: Secondary | ICD-10-CM

## 2022-08-30 NOTE — Telephone Encounter (Signed)
Patient reported left knee brace feeling loose (neoprene sleeve) dispensed from clinic stock to patient months ago.  Patient had total knee performed swelling has been decreasing.  Still having some pain with walking.  Discussed with patient will remeasure and if no longer in large size range will dispense new one to her from clinic stock.  16.5" measured circumferential fossa to central patella medium size now indicated and given to patient from clinic stock.  She stated feels much better immediately on changing out sizes.  Discussed with patient swelling will continue to diminish with healing over the next year and continue to monitor if this knee sleeve becoming looser with healing and weight loss also.  Patient verbalized understanding information/instructions, agreed with plan of care and had no further questions at this time.  Gait sure and steady in clinic.

## 2022-09-06 ENCOUNTER — Ambulatory Visit (INDEPENDENT_AMBULATORY_CARE_PROVIDER_SITE_OTHER): Payer: No Typology Code available for payment source

## 2022-09-06 ENCOUNTER — Encounter: Payer: Self-pay | Admitting: Orthopedic Surgery

## 2022-09-06 ENCOUNTER — Other Ambulatory Visit: Payer: Self-pay | Admitting: Surgical

## 2022-09-06 ENCOUNTER — Ambulatory Visit: Payer: No Typology Code available for payment source | Admitting: Surgical

## 2022-09-06 DIAGNOSIS — Z96652 Presence of left artificial knee joint: Secondary | ICD-10-CM

## 2022-09-06 NOTE — Progress Notes (Signed)
Follow-up Office Visit Note   Patient: Stephanie Chandler           Date of Birth: 07/03/1966           MRN: [id number] Visit Date: 09/06/2022 Requested by: Aliene Beams, MD [phone] [address] [place],  [place] [address] PCP: Aliene Beams, MD  Subjective: Chief Complaint  Patient presents with   Left Knee - Pain    HPI: Stephanie Chandler is a 56 y.o. female who returns to the office for follow-up visit.    Plan at last visit was:  Plan at this time is continue light duty work at work for the next 6 weeks.  6-week return with repeat radiographs.  Radiographs from mid July looked good.  Continue with physical therapy.  Since then, patient notes slow overall improvement in her primarily lateral left knee pain.  She is about 6 months out from surgery at this point.  She is sleeping better at night.  Still notes some tenderness and soreness along the lateral aspect of the knee.  She is able to stand for long periods of time.  Knee does not start to ache until about 30 minutes of standing consistently.  Taking gabapentin and Celebrex for pain as needed.  No fevers or chills.  No chest pain or trouble breathing.  No worsening of her symptoms that she feels a slow but steady progressive improvement.              ROS: All systems reviewed are negative as they relate to the chief complaint within the history of present illness.  Patient denies fevers or chills.  Assessment & Plan: Visit Diagnoses:  1. S/P total knee arthroplasty, left     Plan: Stephanie Chandler is a 56 y.o. female who returns to the office for follow-up visit for left knee pain.  Plan from last visit was noted above in HPI.  They now return with slow but steady improvement in her left knee pain since last appointment about 6 weeks ago.  She is about 6 months out from procedure.  Radiographs were taken today demonstrate no significant change compared with prior radiographs.  Hardware remains in good position with no evidence of hardware  failure or any loosening.  Plan is to continue with home exercise program and follow-up with the office as needed.  Return parameters were discussed with her today.  Discussed antibiotic dental prophylaxis.  She will follow-up with the office as needed.  Expect that she will have slow improvement up to the 12 to 67-month mark.  If she is not feeling satisfied around the anniversary of her surgery, she is welcome to return or if she feels any worsening of her symptoms.  Work note provided.  Follow-Up Instructions: No follow-ups on file.   Orders:  Orders Placed This Encounter  Procedures   XR KNEE 3 VIEW LEFT   No orders of the defined types were placed in this encounter.     Procedures: No procedures performed   Clinical Data: No additional findings.  Objective: Vital Signs: There were no vitals taken for this visit.  Physical Exam:  Constitutional: Patient appears well-developed HEENT:  Head: Normocephalic Eyes:EOM are normal Neck: Normal range of motion Cardiovascular: Normal rate Pulmonary/chest: Effort normal Neurologic: Patient is alert Skin: Skin is warm Psychiatric: Patient has normal mood and affect  Ortho Exam: Ortho exam demonstrates left knee with no effusion.  0 degrees extension.  115 degrees knee flexion.  No calf  tenderness.  Negative Homans' sign.  Able to perform straight leg raise without extensor lag.  No pain with hip range of motion.  No bruising or ecchymosis noted.  There is no sensitivity or tenderness out of proportion to light touch.  Incision is well-healed.  No sign of infection.  Specialty Comments:  No specialty comments available.  Imaging: No results found.   PMFS History: Patient Active Problem List   Diagnosis Date Noted   Arthritis of left knee    S/P total knee arthroplasty, left 03/14/2022   Vitamin D deficiency 02/07/2022   Adjustment disorder with depressed mood 02/07/2022   Hyperlipidemia, unspecified 12/12/2021   Type 2  diabetes mellitus with unspecified complications ([facility]) 09/38/1829   Urge incontinence of urine 12/12/2021   Degeneration of lumbar intervertebral disc 12/12/2021   Halitosis 07/25/2021   Abdominal bloating 06/23/2021   Flatulence, eructation and gas pain 06/23/2021   Chronic pain 11/04/2020   Exacerbation of asthma 11/04/2020   Hyperglycemia due to type 2 diabetes mellitus ([facility]) 11/04/2020   Mild intermittent asthma 11/04/2020   Intractable hiccups 11/10/2019   COVID-19 virus infection 11/10/2019   Morbid obesity ([facility]) 12/12/2018   Acute pain of right shoulder 03/15/2017   Strain of right trapezius muscle 03/15/2017   Musculoskeletal pain 03/15/2017   OSA (obstructive sleep apnea) 08/23/2015   Sciatica 11/13/2012   Past Medical History:  Diagnosis Date   Arthritis    Asthma    Diabetes mellitus without complication ([facility])    type 2   Sleep apnea    pt states "one doctor told me to wear CPAP, another told me I didn't need to, so I do not wear a CPAP"    Family History  Problem Relation Age of Onset   Diabetes Mother    Hypertension Mother    Arthritis Mother    Diabetes Father    Kidney disease Father    Heart disease Maternal Grandmother    Diabetes Paternal Grandmother     Past Surgical History:  Procedure Laterality Date   ABDOMINAL HYSTERECTOMY     CATARACT EXTRACTION Bilateral 2018   HYSTERECTOMY ABDOMINAL WITH SALPINGECTOMY  1997   TOTAL KNEE ARTHROPLASTY Left 03/14/2022   Procedure: LEFT TOTAL KNEE ARTHROPLASTY;  Surgeon: Meredith Pel, MD;  Location: [facility];  Service: Orthopedics;  Laterality: Left;   Social History   Occupational History   Not on file  Tobacco Use   Smoking status: Former    Packs/day: 0.12    Years: 0.50    Total pack years: 0.06    Types: Cigarettes    Quit date: 2021    Years since quitting: 2.7   Smokeless tobacco: Never  Vaping Use   Vaping Use: Never used  Substance and Sexual Activity   Alcohol use: Not Currently   Drug  use: No   Sexual activity: Never

## 2022-09-13 ENCOUNTER — Telehealth: Payer: Self-pay | Admitting: Registered Nurse

## 2022-09-13 ENCOUNTER — Encounter: Payer: Self-pay | Admitting: Registered Nurse

## 2022-09-13 DIAGNOSIS — R110 Nausea: Secondary | ICD-10-CM

## 2022-09-13 DIAGNOSIS — R630 Anorexia: Secondary | ICD-10-CM

## 2022-09-13 MED ORDER — CALCIUM CARBONATE ANTACID 500 MG PO CHEW: 1.0000 | CHEWABLE_TABLET | Freq: Four times a day (QID) | ORAL | Status: AC | PRN

## 2022-09-13 NOTE — Telephone Encounter (Signed)
Patient came to clinic asking for medication for upset stomach denied fever/chills/n/v/d.  Tums UD 500mg  generic oral given to patient stated may take 1 dose up to 4 times per day as needed for sour stomach/heartburn.  Patient verbalized understanding information/instructions and had no further questions at this time.

## 2022-09-13 NOTE — Telephone Encounter (Signed)
Followed up with patient in workcenter approximately 4 hours later.  She stated tums did help her symptoms.  Noted that her PCM increased ozempic dose in the last week  Discussed with patient with increases in dosing GI symptoms common but if worsening or not resolving prior to next dose she should notify her provider before taking next dose. Patient plans to buy OTC tums to have at home for prn use.  Patient stated appetite has been minimal and not eating much solid food. Tried sandwich and really upset her stomach.  Recommended bland diet avoid spicy/fried and large portions dairy/meat if stomach upset.  Recommended patient check her blood sugar level more frequently with decreased po intake.   Exitcare video incretins and ozempic handout sent to patient my chart.  Patient verbalized understanding information/instructions, agreed with plan of care and had no further questions at this time.

## 2022-09-15 ENCOUNTER — Other Ambulatory Visit: Payer: Self-pay | Admitting: Surgical

## 2022-09-19 ENCOUNTER — Other Ambulatory Visit: Payer: Self-pay | Admitting: Family Medicine

## 2022-09-19 DIAGNOSIS — Z1231 Encounter for screening mammogram for malignant neoplasm of breast: Secondary | ICD-10-CM

## 2022-09-23 ENCOUNTER — Ambulatory Visit
Admission: RE | Admit: 2022-09-23 | Discharge: 2022-09-23 | Disposition: A | Discharge status: Home / Self Care | Payer: No Typology Code available for payment source | Source: Ambulatory Visit | Attending: Family Medicine | Admitting: Family Medicine

## 2022-09-23 DIAGNOSIS — Z1231 Encounter for screening mammogram for malignant neoplasm of breast: Secondary | ICD-10-CM

## 2022-09-30 NOTE — Telephone Encounter (Signed)
Spoke with patient in workcenter.  Stated she followed up with PCM and semaglutide dose was adjusted down and side effects/symptoms decreased/tolerable now.  Denied further questions or concerns at this time.  A&Ox3, spoke full sentences without difficulty, gait sure and steady, respirations even and unlabored RA.

## 2022-10-31 ENCOUNTER — Other Ambulatory Visit: Payer: Self-pay | Admitting: Gastroenterology

## 2022-10-31 DIAGNOSIS — K2100 Gastro-esophageal reflux disease with esophagitis, without bleeding: Secondary | ICD-10-CM

## 2022-11-02 ENCOUNTER — Telehealth: Payer: Self-pay | Admitting: Registered Nurse

## 2022-11-02 ENCOUNTER — Encounter: Payer: Self-pay | Admitting: Registered Nurse

## 2022-11-02 DIAGNOSIS — R12 Heartburn: Secondary | ICD-10-CM

## 2022-11-02 MED ORDER — OMEPRAZOLE 20 MG PO CPDR: 20.0000 mg | DELAYED_RELEASE_CAPSULE | Freq: Every day | ORAL | 0 refills | Status: DC

## 2022-11-02 NOTE — Telephone Encounter (Signed)
Patient last filled omeprazole DR 20mg  po daily #90 on 02/07/22 intermittent heartburn symptoms patient requested refill today.  Dispensed #90 to patient today  Last magnesium level 2.2 06/23/21 will check level with next labs.  Patient needs new Rx from [facility] as expired.

## 2022-11-07 ENCOUNTER — Ambulatory Visit
Admission: RE | Admit: 2022-11-07 | Discharge: 2022-11-07 | Disposition: A | Discharge status: Home / Self Care | Payer: No Typology Code available for payment source | Source: Ambulatory Visit | Attending: Gastroenterology | Admitting: Gastroenterology

## 2022-11-07 DIAGNOSIS — K2100 Gastro-esophageal reflux disease with esophagitis, without bleeding: Secondary | ICD-10-CM

## 2022-11-21 ENCOUNTER — Other Ambulatory Visit: Payer: Self-pay | Admitting: Orthopedic Surgery

## 2022-12-28 ENCOUNTER — Ambulatory Visit: Payer: Self-pay | Admitting: Registered Nurse

## 2022-12-28 ENCOUNTER — Encounter: Payer: Self-pay | Admitting: Registered Nurse

## 2022-12-28 VITALS — BP 130/80 | HR 94 | Temp 99.2°F

## 2022-12-28 DIAGNOSIS — J069 Acute upper respiratory infection, unspecified: Secondary | ICD-10-CM

## 2022-12-28 DIAGNOSIS — H6993 Unspecified Eustachian tube disorder, bilateral: Secondary | ICD-10-CM

## 2022-12-28 MED ORDER — SALINE SPRAY 0.65 % NA SOLN: 2.0000 | NASAL | 0 refills | Status: DC

## 2022-12-28 MED ORDER — FLUTICASONE PROPIONATE 50 MCG/ACT NA SUSP: 1.0000 | Freq: Two times a day (BID) | NASAL | 6 refills | Status: DC

## 2022-12-28 NOTE — Progress Notes (Signed)
Subjective:    Patient ID: Stephanie Chandler, female    DOB: November 09, 1966, 57 y.o.   MRN: [id number]  56y/o african [place] female established patient here for evaluation right ear pain and runny nose.  Denied known sick contacts.  Home covid test negative today.  Mucous clear yellow.  Denied n/v/d/fever/chills/fatigue/body aches.  Ran out of flonase nasal and would like refill bottle nasal saline running low.      Review of Systems  Constitutional:  Negative for chills, diaphoresis and fever.  HENT:  Positive for congestion, ear pain, rhinorrhea and sinus pressure. Negative for ear discharge, facial swelling, hearing loss, mouth sores, postnasal drip, sinus pain, sneezing, sore throat, trouble swallowing and voice change.   Eyes:  Negative for photophobia, pain, discharge, redness, itching and visual disturbance.  Respiratory:  Negative for cough, shortness of breath, wheezing and stridor.   Cardiovascular:  Negative for chest pain.  Gastrointestinal:  Negative for diarrhea and vomiting.  Genitourinary:  Negative for difficulty urinating.  Musculoskeletal:  Negative for gait problem, neck pain and neck stiffness.  Skin:  Negative for rash.  Allergic/Immunologic: Positive for environmental allergies.  Neurological:  Negative for dizziness, tremors, syncope, facial asymmetry, speech difficulty, weakness, light-headedness and headaches.  Hematological:  Negative for adenopathy.  Psychiatric/Behavioral:  Negative for agitation, confusion and sleep disturbance.        Objective:   Physical Exam Vitals and nursing note reviewed.  Constitutional:      General: She is awake. She is not in acute distress.    Appearance: Normal appearance. She is well-developed and well-groomed. She is obese. She is not ill-appearing, toxic-appearing or diaphoretic.  HENT:     Head: Normocephalic and atraumatic.     Jaw: There is normal jaw occlusion. No trismus.     Salivary Glands: Right salivary gland is  not diffusely enlarged or tender. Left salivary gland is not diffusely enlarged or tender.     Right Ear: Hearing, ear canal and external ear normal. No decreased hearing noted. No laceration, drainage, swelling or tenderness. A middle ear effusion is present. There is no impacted cerumen. No foreign body. No mastoid tenderness. No PE tube. No hemotympanum. Tympanic membrane is not injected, scarred, perforated, erythematous or retracted.     Left Ear: Hearing, ear canal and external ear normal. No decreased hearing noted. No laceration, drainage, swelling or tenderness. A middle ear effusion is present. There is no impacted cerumen. No foreign body. No mastoid tenderness. No PE tube. No hemotympanum. Tympanic membrane is not injected, scarred, perforated, erythematous or retracted.     Ears:     Comments: Bilateral TMs intact air fluid level clear     Nose: Mucosal edema, congestion and rhinorrhea present. No nasal deformity, septal deviation or laceration. Rhinorrhea is clear.     Right Turbinates: Enlarged and swollen. Not pale.     Left Turbinates: Enlarged and swollen. Not pale.     Right Sinus: Maxillary sinus tenderness and frontal sinus tenderness present.     Left Sinus: Maxillary sinus tenderness and frontal sinus tenderness present.     Comments: Clear discharge bilateral nasal turbinates edema/erythema; cobblestoning posterior pharynx; bilateral allergic shiners    Mouth/Throat:     Lips: Pink. No lesions.     Mouth: Mucous membranes are moist. Mucous membranes are not pale, not dry and not cyanotic. No lacerations, oral lesions or angioedema.     Dentition: No dental abscesses or gum lesions.     Pharynx: Uvula midline.  Pharyngeal swelling and posterior oropharyngeal erythema present. No oropharyngeal exudate or uvula swelling.     Tonsils: No tonsillar exudate or tonsillar abscesses. 0 on the right. 0 on the left.     Comments: Nasal sniffing in exam room observed Eyes:     General:  Lids are normal. Vision grossly intact. Gaze aligned appropriately. Allergic shiner present. No scleral icterus.       Right eye: No foreign body, discharge or hordeolum.        Left eye: No foreign body, discharge or hordeolum.     Extraocular Movements:     Right eye: Normal extraocular motion and no nystagmus.     Left eye: Normal extraocular motion and no nystagmus.     Conjunctiva/sclera: Conjunctivae normal.     Right eye: Right conjunctiva is not injected. No chemosis, exudate or hemorrhage.    Left eye: Left conjunctiva is not injected. No chemosis, exudate or hemorrhage.    Pupils: Pupils are equal, round, and reactive to light. Pupils are equal.     Right eye: Pupil is round and reactive.     Left eye: Pupil is round and reactive.  Neck:     Thyroid: No thyroid mass or thyromegaly.     Trachea: Trachea and phonation normal. No tracheal tenderness or tracheal deviation.  Cardiovascular:     Rate and Rhythm: Normal rate and regular rhythm.     Pulses: Normal pulses.          Radial pulses are 2+ on the right side and 2+ on the left side.     Heart sounds: Normal heart sounds, S1 normal and S2 normal. Heart sounds not distant. No murmur heard.    No friction rub. No gallop.  Pulmonary:     Effort: Pulmonary effort is normal. No accessory muscle usage or respiratory distress.     Breath sounds: Normal breath sounds and air entry. No stridor, decreased air movement or transmitted upper airway sounds. No decreased breath sounds, wheezing, rhonchi or rales.     Comments: Spoke full sentences without difficulty; wearing disposable surgical mask in clinic; no cough observed in exam room Chest:     Chest wall: No tenderness.  Abdominal:     General: There is no distension.     Palpations: Abdomen is soft.  Musculoskeletal:        General: No tenderness. Normal range of motion.     Right hand: Normal strength. Normal capillary refill.     Left hand: Normal strength. Normal capillary  refill.     Cervical back: Normal range of motion and neck supple. No swelling, edema, deformity, erythema, signs of trauma, lacerations, rigidity, torticollis, tenderness or crepitus. No pain with movement. Normal range of motion.     Thoracic back: No swelling, edema, deformity, signs of trauma, lacerations, spasms or tenderness. Normal range of motion.     Right hip: Normal.     Left hip: Normal.     Right knee: Normal.     Left knee: Normal.  Lymphadenopathy:     Head:     Right side of head: No submental, submandibular, tonsillar, preauricular, posterior auricular or occipital adenopathy.     Left side of head: No submental, submandibular, tonsillar, preauricular, posterior auricular or occipital adenopathy.     Cervical: No cervical adenopathy.     Right cervical: No superficial, deep or posterior cervical adenopathy.    Left cervical: No superficial, deep or posterior cervical adenopathy.  Skin:  General: Skin is warm and dry.     Capillary Refill: Capillary refill takes less than 2 seconds.     Coloration: Skin is not ashen, cyanotic, jaundiced, mottled, pale or sallow.     Findings: No abrasion, abscess, acne, bruising, burn, ecchymosis, erythema, signs of injury, laceration, lesion, petechiae, rash or wound.     Nails: There is no clubbing.  Neurological:     General: No focal deficit present.     Mental Status: She is alert and oriented to person, place, and time. Mental status is at baseline. She is not disoriented.     GCS: GCS eye subscore is 4. GCS verbal subscore is 5. GCS motor subscore is 6.     Cranial Nerves: Cranial nerves 2-12 are intact. No cranial nerve deficit, dysarthria or facial asymmetry.     Sensory: Sensation is intact. No sensory deficit.     Motor: Motor function is intact. No weakness, tremor, atrophy, abnormal muscle tone or seizure activity.     Coordination: Coordination is intact. Coordination normal.     Gait: Gait is intact. Gait normal.      Comments: On/off exam table and in/out of chair without difficulty; gait sure and steady in clinic; bilateral hand grasp 5/5 equal  Psychiatric:        Attention and Perception: Attention and perception normal.        Mood and Affect: Mood and affect normal.        Speech: Speech normal.        Behavior: Behavior normal. Behavior is cooperative.        Thought Content: Thought content normal.        Cognition and Memory: Cognition and memory normal.        Judgment: Judgment normal.           Assessment & Plan:  A-viral uri  P-Home covid test negative; afebrile discussed viral URIs circulating in community/OTC treatment symptoms; notify clinic staff if fever greater than 100.76F/body aches develop as flu still circulating in community will send in rx for antivirals.  Electronic Rx sent to her pharmacy of choice restart flonase nasal 51mcg 1 spray each nostril BID #16 Rf6 discussed multipack at [employer] cheaper than pharmacy copay with insurance or [facility] OTC $7 per single bottle versus $10 copay, saline 2 sprays each nostril q2h wa prn congestion given 1 bottle from clinic stock.  Denied personal or family history of ENT cancer.  Shower BID especially prior to bed. No evidence of systemic bacterial infection, non toxic and well hydrated.  I do not see where any further testing or imaging is necessary at this time.   I will suggest supportive care, rest, good hygiene and encourage the patient to take adequate fluids.  The patient is to return to clinic or EMERGENCY ROOM if symptoms worsen or change significantly.  Exitcare handout on sinusitis and sinus rinse.  Patient verbalized agreement and understanding of treatment plan and had no further questions at this time.   P2:  Hand washing and cover cough

## 2022-12-28 NOTE — Patient Instructions (Signed)
Eustachian Tube Dysfunction  Eustachian tube dysfunction refers to a condition in which a blockage develops in the narrow passage that connects the middle ear to the back of the nose (eustachian tube). The eustachian tube regulates air pressure in the middle ear by letting air move between the ear and nose. It also helps to drain fluid from the middle ear space. Eustachian tube dysfunction can affect one or both ears. When the eustachian tube does not function properly, air pressure, fluid, or both can build up in the middle ear. What are the causes? This condition occurs when the eustachian tube becomes blocked or cannot open normally. Common causes of this condition include: Ear infections. Colds and other infections that affect the nose, mouth, and throat (upper respiratory tract). Allergies. Irritation from cigarette smoke. Irritation from stomach acid coming up into the esophagus (gastroesophageal reflux). The esophagus is the part of the body that moves food from the mouth to the stomach. Sudden changes in air pressure, such as from descending in an airplane or scuba diving. Abnormal growths in the nose or throat, such as: Growths that line the nose (nasal polyps). Abnormal growth of cells (tumors). Enlarged tissue at the back of the throat (adenoids). What increases the risk? You are more likely to develop this condition if: You smoke. You are overweight. You are a child who has: Certain birth defects of the mouth, such as cleft palate. Large tonsils or adenoids. What are the signs or symptoms? Common symptoms of this condition include: A feeling of fullness in the ear. Ear pain. Clicking or popping noises in the ear. Ringing in the ear (tinnitus). Hearing loss. Loss of balance. Dizziness. Symptoms may get worse when the air pressure around you changes, such as when you travel to an area of high elevation, fly on an airplane, or go scuba diving. How is this diagnosed? This  condition may be diagnosed based on: Your symptoms. A physical exam of your ears, nose, and throat. Tests, such as those that measure: The movement of your eardrum. Your hearing (audiometry). How is this treated? Treatment depends on the cause and severity of your condition. In mild cases, you may relieve your symptoms by moving air into your ears. This is called "popping the ears." In more severe cases, or if you have symptoms of fluid in your ears, treatment may include: Medicines to relieve congestion (decongestants). Medicines that treat allergies (antihistamines). Nasal sprays or ear drops that contain medicines that reduce swelling (steroids). A procedure to drain the fluid in your eardrum. In this procedure, a small tube may be placed in the eardrum to: Drain the fluid. Restore the air in the middle ear space. A procedure to insert a balloon device through the nose to inflate the opening of the eustachian tube (balloon dilation). Follow these instructions at home: Lifestyle Do not do any of the following until your health care provider approves: Travel to high altitudes. Fly in airplanes. Work in a pressurized cabin or room. Scuba dive. Do not use any products that contain nicotine or tobacco. These products include cigarettes, chewing tobacco, and vaping devices, such as e-cigarettes. If you need help quitting, ask your health care provider. Keep your ears dry. Wear fitted earplugs during showering and bathing. Dry your ears completely after. General instructions Take over-the-counter and prescription medicines only as told by your health care provider. Use techniques to help pop your ears as recommended by your health care provider. These may include: Chewing gum. Yawning. Frequent, forceful swallowing.   Closing your mouth, holding your nose closed, and gently blowing as if you are trying to blow air out of your nose. Keep all follow-up visits. This is important. Contact a  health care provider if: Your symptoms do not go away after treatment. Your symptoms come back after treatment. You are unable to pop your ears. You have: A fever. Pain in your ear. Pain in your head or neck. Fluid draining from your ear. Your hearing suddenly changes. You become very dizzy. You lose your balance. Get help right away if: You have a sudden, severe increase in any of your symptoms. Summary Eustachian tube dysfunction refers to a condition in which a blockage develops in the eustachian tube. It can be caused by ear infections, allergies, inhaled irritants, or abnormal growths in the nose or throat. Symptoms may include ear pain or fullness, hearing loss, or ringing in the ears. Mild cases are treated with techniques to unblock the ears, such as yawning or chewing gum. More severe cases are treated with medicines or procedures. This information is not intended to replace advice given to you by your health care provider. Make sure you discuss any questions you have with your health care provider. Document Revised: 01/24/2021 Document Reviewed: 01/24/2021 Elsevier Patient Education  2023 Elsevier Inc. Viral Respiratory Infection A respiratory infection is an illness that affects part of the respiratory system, such as the lungs, nose, or throat. A respiratory infection that is caused by a virus is called a viral respiratory infection. Common types of viral respiratory infections include: A cold. The flu (influenza). A respiratory syncytial virus (RSV) infection. What are the causes? This condition is caused by a virus. The virus may spread through contact with droplets or direct contact with infected people or their mucus or secretions. The virus may spread from person to person (is contagious). What are the signs or symptoms? Symptoms of this condition include: A stuffy or runny nose. A sore throat or cough. Shortness of breath or difficulty breathing. Yellow or green  mucus (sputum). Other symptoms may include: A fever. Sweating or chills. Fatigue. Achy muscles. A headache. How is this diagnosed? This condition may be diagnosed based on: Your symptoms. A physical exam. Testing of secretions from the nose or throat. Chest X-ray. How is this treated? This condition may be treated with medicines, such as: Antiviral medicine. This may shorten the length of time a person has symptoms. Expectorants. These make it easier to cough up mucus. Decongestant nasal sprays. Acetaminophen or NSAIDs, such as ibuprofen, to relieve fever and pain. Antibiotic medicines are not prescribed for viral infections.This is because antibiotics are designed to kill bacteria. They do not kill viruses. Follow these instructions at home: Managing pain and congestion Take over-the-counter and prescription medicines only as told by your health care provider. If you have a sore throat, gargle with a mixture of salt and water 3-4 times a day or as needed. To make salt water, completely dissolve -1 tsp (3-6 g) of salt in 1 cup (237 mL) of warm water. Use nose drops made from salt water to ease congestion and soften raw skin around your nose. Take 2 tsp (10 mL) of honey at bedtime to lessen coughing at night. Do not give honey to children who are younger than 1 year. Drink enough fluid to keep your urine pale yellow. This helps prevent dehydration and helps loosen up mucus. General instructions  Rest as much as possible. Do not drink alcohol. Do not use any products   that contain nicotine or tobacco. These products include cigarettes, chewing tobacco, and vaping devices, such as e-cigarettes. If you need help quitting, ask your health care provider. Keep all follow-up visits. This is important. How is this prevented?     Get an annual flu shot. You may get the flu shot in late summer, fall, or winter. Ask your health care provider when you should get your flu shot. Avoid  spreading your infection to other people. If you are sick: Wash your hands with soap and water often, especially after you cough or sneeze. Wash for at least 20 seconds. If soap and water are not available, use alcohol-based hand sanitizer. Cover your mouth when you cough. Cover your nose and mouth when you sneeze. Do not share cups or eating utensils. Clean commonly used objects often. Clean commonly touched surfaces. Stay home from work or school as told by your health care provider. Avoid contact with people who are sick during cold and flu season. This is generally fall and winter. Contact a health care provider if: Your symptoms last for 10 days or longer. Your symptoms get worse over time. You have severe sinus pain in your face or forehead. The glands in your jaw or neck become very swollen. You have shortness of breath. Get help right away if you: Feel pain or pressure in your chest. Have trouble breathing. Faint or feel like you will faint. Have severe and persistent vomiting. Feel confused or disoriented. These symptoms may represent a serious problem that is an emergency. Do not wait to see if the symptoms will go away. Get medical help right away. Call your local emergency services (911 in the [place] Do not drive yourself to the hospital. Summary A respiratory infection is an illness that affects part of the respiratory system, such as the lungs, nose, or throat. A respiratory infection that is caused by a virus is called a viral respiratory infection. Common types of viral respiratory infections include a cold, influenza, and respiratory syncytial virus (RSV) infection. Symptoms of this condition include a stuffy or runny nose, cough, fatigue, achy muscles, sore throat, and fevers or chills. Antibiotic medicines are not prescribed for viral infections. This is because antibiotics are designed to kill bacteria. They are not effective against viruses. This information is not  intended to replace advice given to you by your health care provider. Make sure you discuss any questions you have with your health care provider. Document Revised: 02/17/2021 Document Reviewed: 02/17/2021 Elsevier Patient Education  2023 Elsevier Inc.  

## 2023-02-06 ENCOUNTER — Encounter: Payer: Self-pay | Admitting: Registered Nurse

## 2023-02-06 ENCOUNTER — Other Ambulatory Visit: Payer: Self-pay | Admitting: Occupational Medicine

## 2023-02-06 ENCOUNTER — Telehealth: Payer: Self-pay | Admitting: Registered Nurse

## 2023-02-06 DIAGNOSIS — Z8639 Personal history of other endocrine, nutritional and metabolic disease: Secondary | ICD-10-CM

## 2023-02-06 DIAGNOSIS — Z0000 Encounter for general adult medical examination without abnormal findings: Secondary | ICD-10-CM

## 2023-02-06 NOTE — Telephone Encounter (Signed)
RN Evlyn Kanner contacted NP after patient brought lab requests from [facility] office to clinic.  Patient also due Be Well 2025 labs female exec panel, Hgba1c.  On omeprazole magnesium level.  [facility] urinalysis, CBC, lipid panel, Hgba1c and microalbumin.  Last vitamin D Aug 2023.    Placed female executive panel and vitamin D orders for patient to have fasting tomorrow with her [facility] labs.

## 2023-02-07 ENCOUNTER — Telehealth: Payer: Self-pay | Admitting: Registered Nurse

## 2023-02-07 ENCOUNTER — Other Ambulatory Visit: Payer: Self-pay | Admitting: Occupational Medicine

## 2023-02-07 VITALS — BP 115/72 | Ht 60.0 in | Wt 187.0 lb

## 2023-02-07 DIAGNOSIS — R12 Heartburn: Secondary | ICD-10-CM

## 2023-02-07 DIAGNOSIS — Z0000 Encounter for general adult medical examination without abnormal findings: Secondary | ICD-10-CM

## 2023-02-07 DIAGNOSIS — Z8639 Personal history of other endocrine, nutritional and metabolic disease: Secondary | ICD-10-CM

## 2023-02-07 LAB — POCT URINALYSIS DIPSTICK
Bilirubin, UA: NEGATIVE
Blood, UA: NEGATIVE
Glucose, UA: NEGATIVE
Ketones, UA: NEGATIVE
Leukocytes, UA: NEGATIVE
Nitrite, UA: NEGATIVE
Protein, UA: NEGATIVE
Spec Grav, UA: >=1.03 — AB (ref 1.010–1.025)
Urobilinogen, UA: 0.2 E.U./dL
pH, UA: 6 (ref 5.0–8.0)

## 2023-02-07 NOTE — Progress Notes (Signed)
Lab drawn from Left AC tolerated well no issues noted.   

## 2023-02-07 NOTE — Progress Notes (Signed)
Noted patient had Be Well 2025 labs drawn today 02/07/2023 results pending from [place]

## 2023-02-07 NOTE — Addendum Note (Signed)
Addended by: Maudry Mayhew M on: 02/07/2023 02:55 PM   Modules accepted: Orders

## 2023-02-08 LAB — CMP12+LP+TP+TSH+6AC+CBC/D/PLT
ALT: 23 IU/L (ref 0–32)
AST: 21 IU/L (ref 0–40)
Albumin/Globulin Ratio: 1.6 (ref 1.2–2.2)
Albumin: 4.1 g/dL (ref 3.8–4.9)
Alkaline Phosphatase: 82 IU/L (ref 44–121)
BUN/Creatinine Ratio: 13 (ref 9–23)
BUN: 13 mg/dL (ref 6–24)
Basophils Absolute: 0 10*3/uL (ref 0.0–0.2)
Basos: 1 %
Bilirubin Total: 0.4 mg/dL (ref 0.0–1.2)
Calcium: 9.6 mg/dL (ref 8.7–10.2)
Chloride: 104 mmol/L (ref 96–106)
Chol/HDL Ratio: 3.3 ratio (ref 0.0–4.4)
Cholesterol, Total: 154 mg/dL (ref 100–199)
Creatinine, Ser: 0.99 mg/dL (ref 0.57–1.00)
EOS (ABSOLUTE): 0.1 10*3/uL (ref 0.0–0.4)
Eos: 2 %
Estimated CHD Risk: <0.5 times avg. (ref 0.0–1.0)
Free Thyroxine Index: 2 (ref 1.2–4.9)
GGT: 28 IU/L (ref 0–60)
Globulin, Total: 2.5 g/dL (ref 1.5–4.5)
Glucose: 91 mg/dL (ref 70–99)
HDL: 47 mg/dL (ref >39)
Hematocrit: 40.6 % (ref 34.0–46.6)
Hemoglobin: 13.3 g/dL (ref 11.1–15.9)
Immature Grans (Abs): 0 10*3/uL (ref 0.0–0.1)
Immature Granulocytes: 0 %
Iron: 92 ug/dL (ref 27–159)
LDH: 178 IU/L (ref 119–226)
LDL Chol Calc (NIH): 94 mg/dL (ref 0–99)
Lymphocytes Absolute: 1.8 10*3/uL (ref 0.7–3.1)
Lymphs: 31 %
MCH: 29.7 pg (ref 26.6–33.0)
MCHC: 32.8 g/dL (ref 31.5–35.7)
MCV: 91 fL (ref 79–97)
Monocytes Absolute: 0.5 10*3/uL (ref 0.1–0.9)
Monocytes: 9 %
Neutrophils Absolute: 3.5 10*3/uL (ref 1.4–7.0)
Neutrophils: 57 %
Phosphorus: 4 mg/dL (ref 3.0–4.3)
Platelets: 280 10*3/uL (ref 150–450)
Potassium: 4 mmol/L (ref 3.5–5.2)
RBC: 4.48 x10E6/uL (ref 3.77–5.28)
RDW: 12.8 % (ref 11.7–15.4)
Sodium: 142 mmol/L (ref 134–144)
T3 Uptake Ratio: 27 % (ref 24–39)
T4, Total: 7.3 ug/dL (ref 4.5–12.0)
TSH: 1.02 u[IU]/mL (ref 0.450–4.500)
Total Protein: 6.6 g/dL (ref 6.0–8.5)
Triglycerides: 67 mg/dL (ref 0–149)
Uric Acid: 3.9 mg/dL (ref 3.0–7.2)
VLDL Cholesterol Cal: 13 mg/dL (ref 5–40)
WBC: 6 10*3/uL (ref 3.4–10.8)
eGFR: 67 mL/min/{1.73_m2} (ref >59)

## 2023-02-08 LAB — URINALYSIS, DIPSTICK ONLY
Bilirubin, UA: NEGATIVE
Glucose, UA: NEGATIVE
Ketones, UA: NEGATIVE
Leukocytes,UA: NEGATIVE
Nitrite, UA: NEGATIVE
Protein,UA: NEGATIVE
RBC, UA: NEGATIVE
Specific Gravity, UA: 1.024 (ref 1.005–1.030)
Urobilinogen, Ur: 0.2 mg/dL (ref 0.2–1.0)
pH, UA: 5.5 (ref 5.0–7.5)

## 2023-02-08 LAB — MAGNESIUM: Magnesium: 2 mg/dL (ref 1.6–2.3)

## 2023-02-08 LAB — VITAMIN D 25 HYDROXY (VIT D DEFICIENCY, FRACTURES): Vit D, 25-Hydroxy: 49.9 ng/mL (ref 30.0–100.0)

## 2023-02-09 LAB — MICROALBUMIN / CREATININE URINE RATIO
Creatinine, Urine: 168 mg/dL
Microalb/Creat Ratio: 4 mg/g creat (ref 0–29)
Microalbumin, Urine: 6.6 ug/mL

## 2023-02-12 ENCOUNTER — Ambulatory Visit: Payer: Self-pay | Admitting: Occupational Medicine

## 2023-02-12 DIAGNOSIS — R7303 Prediabetes: Secondary | ICD-10-CM

## 2023-02-12 NOTE — Progress Notes (Signed)
Lab drawn from Left AC tolerated well no issues noted.    Be well insurance premium discount evaluation:    Epic reviewed by RN Evlyn Kanner and transcribed. Labs  Tobacco attestation signed. [facility] ROI formed signed. Forms placed in the chart.   Patient given handouts for [facility] pharmacies and discount drugs list,MyChart, Tele doc setup, Tele doc Behavioral, [place] counseling and [facility] counseling.  What to do for infectious illness protocol. Given handout for list of medications that can be filled at [facility]. Given Clinic hours and Clinic Email.

## 2023-02-13 LAB — HGB A1C W/O EAG: Hgb A1c MFr Bld: 5.6 % (ref 4.8–5.6)

## 2023-02-24 NOTE — Telephone Encounter (Signed)
Be Well paperwork signed 02/13/23 BP 115/72 LDL 94 weight 187# previous results 208# and 146/88 Hgba1c 6.7 last year congratulated patient on weight loss/improved blood sugars and blood pressure.  If sustains 30 lb weight loss patient eligible for reward from employer.  Patient stated she is feeling better with improved Hga1c, BP and weight loss.  Patient notified met requirements for insurance discount with employer and paperwork given to HR by RN Kimrey.

## 2023-02-24 NOTE — Telephone Encounter (Signed)
Latest Reference Range & Units 02/07/23 11:29 02/12/23 14:15  Hemoglobin A1C 4.8 - 5.6 %  5.6  Appearance  clear   Bilirubin, UA  neg   Clarity, UA  clear   Color, UA  yellow   Glucose Negative  Negative   Ketones, UA  neg   Leukocytes,UA Negative  Negative   Nitrite, UA  neg   pH, UA 5.0 - 8.0  6.0   Protein,UA Negative  Negative   Specific Gravity, UA 1.010 - 1.025  >=1.030 !   Urobilinogen, UA 0.2 or 1.0 E.U./dL 0.2   RBC, UA  neg   !: Data is abnormal  Patient notified urinalysis dipstick normal except concentrated to increase water intake.  RN Kimrey sent results to PCM/gave patient copy.  No further questions at that time.

## 2023-02-27 ENCOUNTER — Encounter: Payer: Self-pay | Admitting: Registered Nurse

## 2023-02-27 ENCOUNTER — Ambulatory Visit: Payer: Self-pay | Admitting: Registered Nurse

## 2023-02-27 VITALS — BP 138/78 | HR 82

## 2023-02-27 DIAGNOSIS — Z1211 Encounter for screening for malignant neoplasm of colon: Secondary | ICD-10-CM | POA: Insufficient documentation

## 2023-02-27 DIAGNOSIS — M7042 Prepatellar bursitis, left knee: Secondary | ICD-10-CM

## 2023-02-27 DIAGNOSIS — K219 Gastro-esophageal reflux disease without esophagitis: Secondary | ICD-10-CM | POA: Insufficient documentation

## 2023-02-27 DIAGNOSIS — R5383 Other fatigue: Secondary | ICD-10-CM | POA: Insufficient documentation

## 2023-02-27 DIAGNOSIS — R1013 Epigastric pain: Secondary | ICD-10-CM | POA: Insufficient documentation

## 2023-02-27 DIAGNOSIS — B009 Herpesviral infection, unspecified: Secondary | ICD-10-CM | POA: Insufficient documentation

## 2023-02-27 DIAGNOSIS — M79662 Pain in left lower leg: Secondary | ICD-10-CM

## 2023-02-27 MED ORDER — IBUPROFEN 800 MG PO TABS: 800.0000 mg | ORAL_TABLET | Freq: Three times a day (TID) | ORAL | 0 refills | Status: AC | PRN

## 2023-02-27 NOTE — Patient Instructions (Signed)
Deep Vein Thrombosis  Deep vein thrombosis (DVT) is a condition in which a blood clot forms in a vein of the deep venous system. This can occur in the lower leg, thigh, pelvis, arm, or neck. A clot is blood that has thickened into a gel or solid. This condition is serious and can be life-threatening if the clot travels to the arteries of the lungs and causes a blockage (pulmonary embolism). A DVT can also damage veins in the leg, which can lead to long-term venous disease, leg pain, swelling, discoloration, and ulcers or sores (post-thrombotic syndrome). What are the causes? This condition may be caused by: A slowdown of blood flow. Damage to a vein. A condition that causes blood to clot more easily, such as certain bleeding disorders. What increases the risk? The following factors may make you more likely to develop this condition: Obesity. Being older, especially older than age 3. Being inactive or not moving around (sedentary lifestyle). This may include: Sitting or lying down for longer than 4-6 hours other than to sleep at night. Being in the hospital, or having major or lengthy surgery. Having any recent bone injuries, such as breaks (fractures), that reduce movement, especially in the lower extremities. Having recent orthopedic surgery on the lower extremities. Being pregnant, giving birth, or having recently given birth. Taking medicines that contain estrogen, such as birth control or hormone replacement therapy. Using products that contain nicotine or tobacco, especially if you use hormonal birth control. Having a history of a blood vessel disease (peripheral vascular disease) or congestive heart disease. Having a history of cancer, especially if being treated with chemotherapy. What are the signs or symptoms? Symptoms of this condition include: Swelling, pain, pressure, or tenderness in an arm or a leg. An arm or a leg becoming warm, red, or discolored. A leg turning very pale or  blue. You may have a large DVT. This is rare. If the clot is in your leg, you may notice that symptoms get worse when you stand or walk. In some cases, there are no symptoms. How is this diagnosed? This condition is diagnosed with: Your medical history and a physical exam. Tests, such as: Blood tests to check how well your blood clots. Doppler ultrasound. This is the best way to find a DVT. CT venogram. Contrast dye is injected into a vein, and X-rays are taken to check for clots. This is helpful for veins in the chest or pelvis. How is this treated? Treatment for this condition depends on: The cause of your DVT. The size and location of your DVT, or having more than one DVT. Your risk for bleeding or developing more clots. Other medical conditions you may have. Treatment may include: Taking a blood thinner medicine (anticoagulant) to prevent more clots from forming or current clots from growing. Wearing compression stockings. Injecting medicines into the affected vein to break up the clot (catheter-directed thrombolysis). Surgical procedures, when DVT is severe or hard to treat. These may be done to: Isolate and remove your clot. Place an inferior vena cava (IVC) filter. This filter is placed into a large vein called the inferior vena cava to catch blood clots before they reach your lungs. You may get some medical treatments for 6 months or longer. Follow these instructions at home: If you are taking blood thinners: Talk with your health care provider before you take any medicines that contain aspirin or NSAIDs, such as ibuprofen. These medicines increase your risk for dangerous bleeding. Take your medicine exactly  as told, at the same time every day. Do not skip a dose. Do not take more than the prescribed dose. This is important. Ask your health care provider about foods and medicines that could change or interact with the way your blood thinner works. Avoid these foods and medicines  if you are told to do so. Avoid anything that may cause bleeding or bruising. You may bleed more easily while taking blood thinners. Be very careful when using knives, scissors, or other sharp objects. Use an electric razor instead of a blade. Avoid activities that could cause injury or bruising, and follow instructions for preventing falls. Tell your health care provider if you have had any internal bleeding, bleeding ulcers, or neurologic diseases, such as strokes or cerebral aneurysms. Wear a medical alert bracelet or carry a card that lists what medicines you take. General instructions Take over-the-counter and prescription medicines only as told by your health care provider. Return to your normal activities as told by your health care provider. Ask your health care provider what activities are safe for you. If recommended, wear compression stockings as told by your health care provider. These stockings help to prevent blood clots and reduce swelling in your legs. Never wear your compression stockings while sleeping at night. Keep all follow-up visits. This is important. Where to find more information American Heart Association: www.heart.org Centers for Disease Control and Prevention: http://www.wolf.info/ National Heart, Lung, and Blood Institute: https://wilson-eaton.com/ Contact a health care provider if: You miss a dose of your blood thinner. You have unusual bruising or other color changes. You have new or worse pain, swelling, or redness in an arm or a leg. You have worsening numbness or tingling in an arm or a leg. You have a significant color change (pale or blue) in the extremity that has the DVT. Get help right away if: You have signs or symptoms that a blood clot has moved to the lungs. These may include: Shortness of breath. Chest pain. Fast or irregular heartbeats (palpitations). Light-headedness, dizziness, or fainting. Coughing up blood. You have signs or symptoms that your blood is  too thin. These may include: Blood in your vomit, stool, or urine. A cut that will not stop bleeding. A menstrual period that is heavier than usual. A severe headache or confusion. These symptoms may be an emergency. Get help right away. Call 911. Do not wait to see if the symptoms will go away. Do not drive yourself to the hospital. Summary Deep vein thrombosis (DVT) happens when a blood clot forms in a deep vein. This may occur in the lower leg, thigh, pelvis, arm, or neck. Symptoms affect the arm or leg and can include swelling, pain, tenderness, warmth, redness, or discoloration. This condition may be treated with medicines. In severe cases, a procedure or surgery may be done to remove or dissolve the clots. If you are taking blood thinners, take them exactly as told. Do not skip a dose. Do not take more than is prescribed. Get help right away if you have a severe headache, shortness of breath, chest pain, fast or irregular heartbeats, or blood in your vomit, urine, or stool. This information is not intended to replace advice given to you by your health care provider. Make sure you discuss any questions you have with your health care provider. Document Revised: 06/06/2021 Document Reviewed: 06/06/2021 Elsevier Patient Education  [place] Bursitis  Prepatellar bursitis is inflammation of the prepatellar bursa, which is a fluid-filled sac that acts  as a cushion between the kneecap (patella) and the skin. Prepatellar bursitis happens when fluid builds up in this sac and causes it to swell. The condition causes knee pain. If left untreated, the bursa can get infected and become more serious. What are the causes? This condition may be caused by: Constant pressure on the knees from kneeling. A hit to the knee. Falling on the knee. Infection from bacteria. Moving the knee repeatedly in a forceful way. What increases the risk? You are more likely to develop this  condition if: You play sports that have a high risk of falling on the knee or being hit on the knee. These include football, wrestling, basketball, or soccer. You do work that includes kneeling for long periods of time, such as roofing, flooring, plumbing, or gardening. You have another inflammatory condition, such as gout or rheumatoid arthritis. What are the signs or symptoms? The most common symptom of this condition is knee pain that gets better with rest. Other symptoms include: Swelling on the front of the kneecap. Warmth in the knee. Tenderness with activity. Redness of the knee. Not being able to bend the knee or to kneel. How is this diagnosed? This condition is diagnosed based on: A physical exam. Your health care provider will compare your knees and check for tenderness and pain while moving your knee. Your medical history. Tests to check for infection. These may include blood tests and tests on the fluid in the bursa. Imaging tests, such as X-rays, an MRI, or ultrasound. These may be done to check for damage in the patella or to check for fluid buildup and swelling in the bursa. How is this treated? This condition may be treated by: Resting, icing, and raising (elevating) the knee. Taking medicines, such as: NSAIDs. These medicines can help to reduce pain and swelling. Antibiotics. These may be needed if you have an infection. Steroids. These are used to reduce swelling and inflammation. They may be prescribed if other treatments are not helping. Doing exercises to help you improve movement and strength (physical therapy). These may be recommended after pain and swelling improve. Having a procedure to remove fluid from the bursa. This may be done if other treatments are not helping. Having surgery to remove the bursa. This may be done if you have a severe infection or if the condition keeps coming back after treatment. Follow these instructions at home: Medicines Take  over-the-counter and prescription medicines only as told by your health care provider. If you were prescribed antibiotics, take them as told by your health care provider. Do not stop using the antibiotic even if you start to feel better. Managing pain, stiffness, and swelling  If told, put ice on the injured area. To do this: Put ice in a plastic bag. Place a towel between your skin and the bag. Leave the ice on for 20 minutes, 2-3 times a day. If your skin turns bright red, remove the ice right away to prevent skin damage. The risk of damage is higher if you cannot feel pain, heat, or cold. Elevate the injured area above the level of your heart while you are sitting or lying down. Activity Do not use the injured limb to support your body weight until your health care provider says that you can. Use crutches or a walker as told by your health care provider. Rest your knee. Avoid activities that cause knee pain. Return to your normal activities as told by your health care provider. Ask your health  care provider what activities are safe for you. Do exercises as told by your health care provider. General instructions Ask your health care provider when it is safe for you to drive or operate machinery. Do not use any products that contain nicotine or tobacco. These products include cigarettes, chewing tobacco, and vaping devices, such as e-cigarettes. These can delay healing. If you need help quitting, ask your health care provider. Keep all follow-up visits. Your health care provider will monitor your healing and adjust your activities. How is this prevented? Warm up and stretch before being active. Cool down and stretch after being active. Give your body time to rest between periods of activity. Maintain physical fitness, including strength and flexibility. Be safe and responsible while being active. This will help you to avoid falls. Wear kneepads if you have to kneel for a long period of  time. Contact a health care provider if: Your symptoms do not improve or they get worse. Your symptoms keep coming back after treatment. You develop a fever and have warmth, redness, or swelling over your knee. This information is not intended to replace advice given to you by your health care provider. Make sure you discuss any questions you have with your health care provider. Document Revised: 05/29/2022 Document Reviewed: 05/29/2022 Elsevier Patient Education  [place].

## 2023-02-27 NOTE — Progress Notes (Signed)
Subjective:    Patient ID: Stephanie Chandler, female    DOB: 10-05-1966, 57 y.o.   MRN: [id number]  56y/o african [place] established female here for evaluation left knee and lower leg pain that started about a week ago.  Has had intermittent pains and swelling since left total knee arthroplasty approximately 10 months ago.  Denied trauma/fever/chills.  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  Has noticed flexion ROM has been decreased recently also so she been doing more prolonged flexion of left knee to help with range of motion and it helps with discomfort "it feels better in that position"  Denied bruising/rash.  Would like refill of ibuprofen as she has run out.  No longer taking celebrex or tramadol  capsicum cream use rare now hasn't used this week.        Review of Systems  Constitutional:  Negative for chills and fever.  HENT:  Negative for trouble swallowing and voice change.   Eyes:  Negative for photophobia and visual disturbance.  Respiratory:  Negative for cough, shortness of breath, wheezing and stridor.   Cardiovascular:  Positive for leg swelling.  Gastrointestinal:  Negative for diarrhea, nausea and vomiting.  Genitourinary:  Negative for difficulty urinating.  Musculoskeletal:  Positive for arthralgias, joint swelling and myalgias. Negative for gait problem, neck pain and neck stiffness.  Skin:  Negative for color change, pallor, rash and wound.  Allergic/Immunologic: Positive for environmental allergies.  Neurological:  Negative for dizziness and facial asymmetry.  Psychiatric/Behavioral:  Negative for agitation, confusion and sleep disturbance.        Objective:   Physical Exam Vitals and nursing note reviewed.  Constitutional:       General: She is awake. She is not in acute distress.    Appearance: Normal appearance. She is well-developed, well-groomed and overweight. She is not ill-appearing, toxic-appearing or diaphoretic.  HENT:     Head: Normocephalic and atraumatic.     Jaw: There is normal jaw occlusion.     Salivary Glands: Right salivary gland is not diffusely enlarged. Left salivary gland is not diffusely enlarged.     Right Ear: Hearing and external ear normal.     Left Ear: Hearing and external ear normal.     Nose: Nose normal. No congestion or rhinorrhea.     Mouth/Throat:     Lips: Pink. No lesions.     Mouth: Mucous membranes are moist. No oral lesions or angioedema.     Dentition: No gum lesions.     Tongue: No lesions. Tongue does not deviate from midline.     Palate: No mass and lesions.     Pharynx: Oropharynx is clear. Uvula midline.  Eyes:     General: Lids are normal. Vision grossly intact. Gaze aligned appropriately. Allergic shiner present. No scleral icterus.       Right eye: No discharge.        Left eye: No discharge.     Extraocular Movements: Extraocular movements intact.     Conjunctiva/sclera: Conjunctivae normal.     Pupils: Pupils are equal, round, and reactive to light.  Neck:     Trachea: Trachea and phonation normal.  Cardiovascular:     Rate and Rhythm: Normal rate and regular rhythm.  Pulses:          Radial pulses are 2+ on the right side and 2+ on the left side.  Pulmonary:     Effort: Pulmonary effort is normal.     Breath sounds: Normal breath sounds and air entry. No stridor or transmitted upper airway sounds. No wheezing.     Comments: Spoke full sentences without difficulty; no cough observed in exam room Abdominal:     General: Abdomen is flat.  Musculoskeletal:     Right hand: Normal strength. Normal capillary refill.     Left hand: Normal strength. Normal capillary refill.     Cervical back: Normal range of motion and neck supple. No edema, erythema,  signs of trauma, rigidity, torticollis or crepitus. No pain with movement. Normal range of motion.     Right upper leg: No swelling, edema, deformity, lacerations, tenderness or bony tenderness.     Left upper leg: No swelling, edema, deformity, lacerations, tenderness or bony tenderness.     Right knee: No swelling, deformity, erythema, ecchymosis, lacerations, bony tenderness or crepitus. Normal range of motion. No tenderness.     Left knee: Swelling, deformity and crepitus present. No erythema, ecchymosis, lacerations or bony tenderness. Decreased range of motion. Tenderness present. No medial joint line, lateral joint line or patellar tendon tenderness.     Right lower leg: No swelling, deformity, lacerations, tenderness or bony tenderness. No edema.     Left lower leg: Swelling and tenderness present. No deformity, lacerations or bony tenderness. 1+ Edema present.     Right ankle: No swelling, deformity or ecchymosis. No tenderness.     Left ankle: No swelling, deformity or ecchymosis. No tenderness.       Legs:     Comments: Visual exam left anterior knee and lateral anterior proximal lower leg appears larger than right but upon measuring with tape diameter right larger circumference at central fossa/patella and 3 inches below distal patella 16" knee 14" calf left; 16.25" knee right 14.5" inches calf pain left anterior lateral left nonpitting edema localized no erythema bruising; prepatellar bursa left lateral edema warmer to touch TTP  Lymphadenopathy:     Head:     Right side of head: No submandibular or preauricular adenopathy.     Left side of head: No submandibular or preauricular adenopathy.     Cervical:     Right cervical: No superficial cervical adenopathy.    Left cervical: No superficial cervical adenopathy.  Skin:    General: Skin is warm and dry.     Capillary Refill: Capillary refill takes less than 2 seconds.     Coloration: Skin is not ashen, cyanotic, jaundiced, mottled,  pale or sallow.     Findings: No abrasion, abscess, acne, bruising, burn, ecchymosis, erythema, signs of injury, laceration, lesion, petechiae, rash or wound.     Nails: There is no clubbing.  Neurological:     General: No focal deficit present.     Mental Status: She is alert and oriented to person, place, and time. Mental status is at baseline.     GCS: GCS eye subscore is 4. GCS verbal subscore is 5. GCS motor subscore is 6.     Cranial Nerves: Cranial nerves 2-12 are intact. No cranial nerve deficit, dysarthria or facial asymmetry.     Motor: Motor function is intact. No weakness, tremor, atrophy, abnormal muscle tone or seizure activity.     Coordination: Coordination is intact. Coordination normal.     Gait: Gait is  intact. Gait normal.     Comments: In/out of chair without difficulty; gait sure and steady in clinic; bilateral hand grasp equal 5/5  Psychiatric:        Attention and Perception: Attention and perception normal.        Mood and Affect: Mood and affect normal.        Speech: Speech normal.        Behavior: Behavior normal. Behavior is cooperative.        Thought Content: Thought content normal.        Cognition and Memory: Cognition and memory normal.        Judgment: Judgment normal.       16 knee 14 calf left; 16.25 knee right 14.5 inches calf pain left anterior lateral left nonpitting edema localized no erythema bruising; prepatellar bursa left lateral edema warmer to touch TTP    Assessment & Plan:   A-Prepatellar bursitis initial visit and left lower leg swelling initial visit  P-discussed avoid holding leg in flexed position for extended periods Avoid frequent massage of prepatellar bursa, kneeling, squats Ice 15 minutes topical QID prn pain/swelling Patient requested to restart ibuprofen 800mg  po TID prn pain.  Does not have mobic/voltaren at home and prefers ibuprofen for pain relief.  Dispensed 30 tabs from [facility] to patient today. Biofreeze topical gel  QID prn pain given 4 UD from clinic stock Given reusable and chemical ice pack from clinic stock 1 each today Discussed symptoms of DVT, infection (cellulitis) and if patient has worsening pain/swelling/redness to notify clinic staff same day Patient gait stable post left knee replacement negative homan's sign Exitcare handouts on DVT and bursitis Patient verbalized understanding information/instructions, agreed with plan of care and had no further questions at this time.

## 2023-05-01 ENCOUNTER — Telehealth: Payer: Self-pay | Admitting: Orthopedic Surgery

## 2023-05-01 MED ORDER — AMOXICILLIN 500 MG PO TABS: ORAL_TABLET | ORAL | 0 refills | Status: DC

## 2023-05-01 NOTE — Telephone Encounter (Signed)
Pt has a upcoming dental appt 6/5 and need antibiotics sent in today to pharmacy [facility] [address] Pt phone number is [phone].

## 2023-05-01 NOTE — Telephone Encounter (Signed)
Submitted to pharmacy. IC LMVM for patient advising done.

## 2023-05-08 ENCOUNTER — Encounter: Payer: Self-pay | Admitting: Registered Nurse

## 2023-05-08 ENCOUNTER — Ambulatory Visit: Payer: Self-pay | Admitting: Registered Nurse

## 2023-05-08 VITALS — HR 66 | Temp 98.5°F | Resp 16

## 2023-05-08 DIAGNOSIS — M7042 Prepatellar bursitis, left knee: Secondary | ICD-10-CM

## 2023-05-08 MED ORDER — NAPROXEN SODIUM 220 MG PO TABS: 220.0000 mg | ORAL_TABLET | Freq: Two times a day (BID) | ORAL | Status: AC | PRN

## 2023-05-08 NOTE — Patient Instructions (Signed)
Stop diclofenac/voltaren gel/ibuprofen/motrin/advil/mobic/meloxicam  Start naproxen/naprosyn/aleve 200mg  by mouth every 12 hours with food as needed for pain  Prepatellar Bursitis  Prepatellar bursitis is inflammation of the prepatellar bursa, which is a fluid-filled sac that acts as a cushion between the kneecap (patella) and the skin. Prepatellar bursitis happens when fluid builds up in this sac and causes it to swell. The condition causes knee pain. If left untreated, the bursa can get infected and become more serious. What are the causes? This condition may be caused by: Constant pressure on the knees from kneeling. A hit to the knee. Falling on the knee. Infection from bacteria. Moving the knee repeatedly in a forceful way. What increases the risk? You are more likely to develop this condition if: You play sports that have a high risk of falling on the knee or being hit on the knee. These include football, wrestling, basketball, or soccer. You do work that includes kneeling for long periods of time, such as roofing, flooring, plumbing, or gardening. You have another inflammatory condition, such as gout or rheumatoid arthritis. What are the signs or symptoms? The most common symptom of this condition is knee pain that gets better with rest. Other symptoms include: Swelling on the front of the kneecap. Warmth in the knee. Tenderness with activity. Redness of the knee. Not being able to bend the knee or to kneel. How is this diagnosed? This condition is diagnosed based on: A physical exam. Your health care provider will compare your knees and check for tenderness and pain while moving your knee. Your medical history. Tests to check for infection. These may include blood tests and tests on the fluid in the bursa. Imaging tests, such as X-rays, an MRI, or ultrasound. These may be done to check for damage in the patella or to check for fluid buildup and swelling in the bursa. How is  this treated? This condition may be treated by: Resting, icing, and raising (elevating) the knee. Taking medicines, such as: NSAIDs. These medicines can help to reduce pain and swelling. Antibiotics. These may be needed if you have an infection. Steroids. These are used to reduce swelling and inflammation. They may be prescribed if other treatments are not helping. Doing exercises to help you improve movement and strength (physical therapy). These may be recommended after pain and swelling improve. Having a procedure to remove fluid from the bursa. This may be done if other treatments are not helping. Having surgery to remove the bursa. This may be done if you have a severe infection or if the condition keeps coming back after treatment. Follow these instructions at home: Medicines Take over-the-counter and prescription medicines only as told by your health care provider. If you were prescribed antibiotics, take them as told by your health care provider. Do not stop using the antibiotic even if you start to feel better. Managing pain, stiffness, and swelling  If told, put ice on the injured area. To do this: Put ice in a plastic bag. Place a towel between your skin and the bag. Leave the ice on for 20 minutes, 2-3 times a day. If your skin turns bright red, remove the ice right away to prevent skin damage. The risk of damage is higher if you cannot feel pain, heat, or cold. Elevate the injured area above the level of your heart while you are sitting or lying down. Activity Do not use the injured limb to support your body weight until your health care provider says that you can.  Use crutches or a walker as told by your health care provider. Rest your knee. Avoid activities that cause knee pain. Return to your normal activities as told by your health care provider. Ask your health care provider what activities are safe for you. Do exercises as told by your health care provider. General  instructions Ask your health care provider when it is safe for you to drive or operate machinery. Do not use any products that contain nicotine or tobacco. These products include cigarettes, chewing tobacco, and vaping devices, such as e-cigarettes. These can delay healing. If you need help quitting, ask your health care provider. Keep all follow-up visits. Your health care provider will monitor your healing and adjust your activities. How is this prevented? Warm up and stretch before being active. Cool down and stretch after being active. Give your body time to rest between periods of activity. Maintain physical fitness, including strength and flexibility. Be safe and responsible while being active. This will help you to avoid falls. Wear kneepads if you have to kneel for a long period of time. Contact a health care provider if: Your symptoms do not improve or they get worse. Your symptoms keep coming back after treatment. You develop a fever and have warmth, redness, or swelling over your knee. This information is not intended to replace advice given to you by your health care provider. Make sure you discuss any questions you have with your health care provider. Document Revised: 05/29/2022 Document Reviewed: 05/29/2022 Elsevier Patient Education  2024 [employer] Prepatellar Bursitis Rehab Ask your health care provider which exercises are safe for you. Do exercises exactly as told by your health care provider and adjust them as directed. It is normal to feel mild stretching, pulling, tightness, or discomfort as you do these exercises. Stop right away if you feel sudden pain or your pain gets worse. Do not begin these exercises until told by your health care provider. Stretching and range-of-motion exercises These exercises warm up your muscles and joints and improve the movement and flexibility of your knee. These exercises also help to relieve pain, numbness, and tingling. Hamstring  stretch, standing In this exercise, you prop your leg on a chair and lean forward to achieve a stretch. To do this exercise: Stand with your left / right heel resting on a chair. Your left / right leg should be fully extended. Arch your lower back slightly. Lean forward at the waist, leading with your chest, until you feel a gentle stretch in the back of your left / right knee or thigh. You should not need to lean far to feel the stretch. Hold this position for ___30_______ seconds. Repeat ____3______ times. Complete this exercise __2________ times a day. Knee flexion, active  Lie on your back with both knees straight. If this causes back discomfort, bend your healthy knee so your foot is flat on the floor. Slowly slide your left / right heel back toward your buttocks (knee flexion). Stop when you feel a gentle stretch in the front of your knee or thigh. Hold this position for ___30_______ seconds. Slowly slide your left / right heel back to the starting position. Repeat ____3______ times. Complete this exercise _____2_____ times a day. Strengthening exercises These exercises build strength and endurance in your knee. Endurance is the ability to use your muscles for a long time, even after they get tired. Quadriceps, isometric This exercise stretches the muscles in the front of your thigh (quadriceps) without moving your knee joint (isometric).  Lie on your back with your left / right leg extended and your other knee bent. If told by your health care provider, put a rolled towel or a small pillow under your left / right knee. Slowly tense the muscles in the front of your left / right thigh. You should see your kneecap slide up toward your hip or see increased dimpling just above your knee. This motion will push the back of your knee down toward the surface that is under it. For ___30_______ seconds, hold the muscles as tight as you can without increasing your pain. Relax the muscles slowly and  completely after each repetition. Repeat ___3_______ times. Complete this exercise _____2_____ times a day. Straight leg raises, supine This exercise is sometimes called a quadriceps and hip flexor exercise. Lie on your back (supine position) with your left / right leg extended and your healthy knee bent. Slowly tense the muscles in the front of your left / right thigh (quadriceps). You should see your kneecap slide up or see increased dimpling just above the knee. Tighten these muscles even more as you raise your leg 4-6 inches (10-15 cm) off the floor. Do not let your knee bend. Hold this position for ____30______ seconds. Keep the thigh muscles tense as you lower your leg. Relax your muscles slowly and completely after each repetition. Repeat _____3_____ times. Complete this exercise ____2______ times a day. Straight leg raises, prone This exercise helps strengthen the muscles in the back of your thigh (hip extensors). Lie on your abdomen (prone position) on a firm surface. You can put a pillow under your hips if that is more comfortable for your lower back. Squeeze your buttocks muscles and lift your left / right leg about 4-6 inches (10-15 cm). Keep your knee straight as you lift your leg. Hold this position for ___30_______ seconds. Slowly lower your leg to the starting position. Let your muscles relax completely after each repetition. Repeat ____3______ times. Complete this exercise _____2_____ times a day. This information is not intended to replace advice given to you by your health care provider. Make sure you discuss any questions you have with your health care provider. Document Revised: 03/23/2022 Document Reviewed: 03/23/2022 Elsevier Patient Education  2024 [employer].

## 2023-05-08 NOTE — Progress Notes (Signed)
Subjective:    Patient ID: Stephanie Chandler, female    DOB: 05/23/1966, 57 y.o.   MRN: [id number]  56y/o african [place] female established patient here for worsening left knee pain.  Had dental appt last week and forgot to take antibiotic prior to appt took afterwards.  Denied fever/chills.  Having swelling and pain with flexion and swelling anterior knee that radiates into anterior shin without rash or bruising.  Denied new activities/gardening/prolonged standing/squatting/kneeling in the past 2 weeks.  Pain 6/10 at this time.  Still wearing neoprene sleeve more comfortable has continued since surgery over the past year  knee pain worsens when she does not wear it  Diclofenac gel prn knee seems to wear off faster than ibuprofen oral and neither helping her much  "I have been changing how I walk on left leg due to pain.  Having back pain now too.  Denied loss of bowel/bladder control, saddle paresthesias or arm/leg weakness.  I tried working out yesterday after work to see if that would help and it did not.  Patient denied continuing PT exercises shown to her after knee surgery.      Review of Systems  HENT:  Negative for trouble swallowing and voice change.   Respiratory:  Negative for cough.   Cardiovascular:  Positive for leg swelling.  Gastrointestinal:  Negative for diarrhea, nausea and vomiting.  Musculoskeletal:  Positive for arthralgias, back pain, gait problem, joint swelling and myalgias. Negative for neck pain and neck stiffness.  Skin:  Negative for color change, rash and wound.  Neurological:  Negative for dizziness, tremors, syncope, weakness and numbness.  Hematological:  Negative for adenopathy. Does not bruise/bleed easily.       Objective:   Physical Exam Vitals and nursing note reviewed.  Constitutional:      General: She is awake. She is not in acute distress.    Appearance: Normal appearance. She is well-developed and well-groomed. She is obese. She is not  ill-appearing, toxic-appearing or diaphoretic.  HENT:     Head: Normocephalic and atraumatic.     Jaw: There is normal jaw occlusion.     Salivary Glands: Right salivary gland is not diffusely enlarged. Left salivary gland is not diffusely enlarged.     Right Ear: Hearing and external ear normal.     Left Ear: Hearing and external ear normal.     Nose: Nose normal. No congestion or rhinorrhea.     Mouth/Throat:     Lips: Pink. No lesions.     Mouth: Mucous membranes are moist.     Pharynx: Oropharynx is clear.  Eyes:     General: Lids are normal. Vision grossly intact. Gaze aligned appropriately. No scleral icterus.       Right eye: No discharge.        Left eye: No discharge.     Extraocular Movements: Extraocular movements intact.     Conjunctiva/sclera: Conjunctivae normal.     Pupils: Pupils are equal, round, and reactive to light.  Neck:     Trachea: Trachea normal.  Cardiovascular:     Rate and Rhythm: Normal rate and regular rhythm.     Pulses: Normal pulses.     Heart sounds: Normal heart sounds.  Pulmonary:     Effort: Pulmonary effort is normal. No respiratory distress.     Breath sounds: Normal breath sounds and air entry. No stridor or transmitted upper airway sounds. No wheezing or rhonchi.     Comments: Spoke full sentences without  difficulty; no cough observed in exam room Abdominal:     General: Abdomen is flat.  Musculoskeletal:        General: Swelling and tenderness present. No signs of injury.     Right hand: Normal strength. Normal capillary refill.     Left hand: Normal strength. Normal capillary refill.     Cervical back: Normal range of motion and neck supple. No swelling, edema, deformity, erythema, signs of trauma, lacerations, rigidity, spasms, tenderness or crepitus. Normal range of motion.     Thoracic back: No swelling, edema, deformity, signs of trauma, lacerations, spasms or tenderness. Normal range of motion.     Right knee: Crepitus present. No  swelling, deformity, effusion, erythema, ecchymosis or lacerations. Normal range of motion. No tenderness.     Left knee: Swelling and effusion present. No erythema, ecchymosis, lacerations or crepitus. Decreased range of motion. Tenderness present over the patellar tendon.     Right lower leg: No edema.     Left lower leg: Swelling and tenderness present. No deformity, lacerations or bony tenderness. No edema.     Right ankle: No swelling, deformity, ecchymosis or lacerations. Normal range of motion.     Left ankle: No swelling, deformity, ecchymosis or lacerations. Normal range of motion.       Legs:     Comments: Prepatellar bursa edematous left; lateral shin edema nonpitting linear 3x7cm no erythema/bruising/fluctuance both areas TTP mildly; flexion left painful at 90 degrees and painful to extension full; posterior fossa not TTP; circumference left 14.25" right 14" over central patella  Lymphadenopathy:     Head:     Right side of head: No submandibular or preauricular adenopathy.     Left side of head: No submandibular or preauricular adenopathy.     Cervical: No cervical adenopathy.     Right cervical: No superficial cervical adenopathy.    Left cervical: No superficial cervical adenopathy.  Skin:    General: Skin is warm and dry.     Capillary Refill: Capillary refill takes less than 2 seconds.     Coloration: Skin is not ashen, cyanotic, jaundiced, mottled, pale or sallow.     Findings: No abrasion, abscess, acne, bruising, burn, ecchymosis, erythema, signs of injury, laceration, lesion, petechiae, rash or wound.     Nails: There is no clubbing.          Comments: Healed scar left knee  Neurological:     General: No focal deficit present.     Mental Status: She is alert and oriented to person, place, and time. Mental status is at baseline.     GCS: GCS eye subscore is 4. GCS verbal subscore is 5. GCS motor subscore is 6.     Cranial Nerves: Cranial nerves 2-12 are intact. No  cranial nerve deficit, dysarthria or facial asymmetry.     Motor: Motor function is intact. No weakness, tremor, abnormal muscle tone or seizure activity.     Coordination: Coordination is intact. Coordination normal.     Gait: Gait is intact. Gait normal.     Comments: In/out of chair and on/off exam table without difficulty; gait sure and steady in clinic; bilateral hand grasp equal 5/5  Psychiatric:        Attention and Perception: Attention and perception normal.        Mood and Affect: Mood and affect normal.        Speech: Speech normal.        Behavior: Behavior normal. Behavior is cooperative.  Thought Content: Thought content normal.        Cognition and Memory: Cognition and memory normal.        Judgment: Judgment normal.           Assessment & Plan:  A-prepatellar bursitis left knee  P-Reviewed epocrates half life ibuprofen and voltaren gel 1-2H discussed naproxen/aleve/naprosyn 12-17h patient will trial to see if better relief discussed may take 1 -220mg   tab every 12 hours max dosing 500mg  BID and avoid other NSAID use e.g. voltaren/diclofenac gel/tabs, ibuprofen/advil/motrin, mobic/meloxicam as they are in the same family.  Avoid dehydration and take with food  Avoid lunges/squats/kneeling.  May still ice 15 minutes QID prn pain/swelling.  May continue knee neoprene sleeve wear for comfort. Discussed altering gait affects back alignment and muscle use.  Consider crutch use if not able to walk with normal gait to prevent back problems from worsening.  Not a good candidate for scooter knee use due to bursitis.  Patient could also borrow wheelchair from clinic but prefers not to at this time.  Has mixed sitting/standing time at work.  [employer] handout prepatellar bursitis and rehab exercises. Restart knee strengthening exercises she was given post surgery. Patient agreed with plan of care and had no further questions at this time.  Notify orthopedics regarding dental visit  took antibiotics after appt instead of prior and has artificial joint.  Patient verbalized understanding information/instructions.

## 2023-06-25 NOTE — Progress Notes (Signed)
Patient met with RN Bess Kinds 02/08/23 discussed be well results/recommendations; patient met 2025 Be Well insurance discount requirements RN Kimrey notified HR dept BP 130/80 LDL 94 Hgba1c 5.6 weight 187lbs; NP completed provider paperwork

## 2023-07-25 ENCOUNTER — Ambulatory Visit: Payer: No Typology Code available for payment source

## 2023-07-25 NOTE — Progress Notes (Signed)
S:  I have to do my respirator fit test. O:  Vital signs recorded and all within limits.  Questionairre for respirator fit testing reviewed with client.  P:   No concerns for safe respirator use for less than 2 hours per day with a half face respirator with cartridge.  Quantitative fit test passes using saccharin solution.  Reece Packer, RN, BSN, MPH, CO?HN-s

## 2023-08-14 ENCOUNTER — Telehealth: Payer: Self-pay | Admitting: Registered Nurse

## 2023-08-14 ENCOUNTER — Other Ambulatory Visit: Payer: Self-pay

## 2023-08-14 ENCOUNTER — Encounter: Payer: Self-pay | Admitting: Registered Nurse

## 2023-08-14 ENCOUNTER — Other Ambulatory Visit: Payer: Self-pay | Admitting: Registered Nurse

## 2023-08-14 DIAGNOSIS — Z0000 Encounter for general adult medical examination without abnormal findings: Secondary | ICD-10-CM

## 2023-08-14 DIAGNOSIS — Z0279 Encounter for issue of other medical certificate: Secondary | ICD-10-CM

## 2023-08-14 LAB — POCT URINALYSIS DIPSTICK
Appearance: NORMAL
Bilirubin, UA: NEGATIVE
Blood, UA: NEGATIVE
Glucose, UA: NEGATIVE
Leukocytes, UA: NEGATIVE
Nitrite, UA: NEGATIVE
Protein, UA: NEGATIVE
Spec Grav, UA: 1.01 (ref 1.010–1.025)
Urobilinogen, UA: 0.2 U/dL
pH, UA: 6 (ref 5.0–8.0)

## 2023-08-14 NOTE — Telephone Encounter (Signed)
Labs fasting drawn by RN Katrinka Blazing today and dipstick urinalysis completed.  RN Katrinka Blazing will fax all labs to [facility] and give patient printed copy when available tomorrow.

## 2023-08-14 NOTE — Telephone Encounter (Signed)
See paper record at [facility] test, medical evaluation questionnaire parts a and b completed/reviewed and discussed with patient 07/26/23.  Cleared for respirator use at [employer] to perform work duties.

## 2023-08-14 NOTE — Progress Notes (Signed)
EE in clinic this morning to have blood drawn for labs requested by her PCP - [facility]. Panel, A1c and dipstick U/A.  Dipstick U/A all normal values except small amount of ketones.  Blood drawn and procedure tolerated well by EE.  Vital signs within normal ranges.  Reece Packer, RN, BSN, MPH, COHN-S

## 2023-08-15 LAB — CMP12+LP+TP+TSH+6AC+CBC/D/PLT
ALT: 32 IU/L (ref 0–32)
AST: 26 IU/L (ref 0–40)
Albumin: 4.1 g/dL (ref 3.8–4.9)
Alkaline Phosphatase: 71 IU/L (ref 44–121)
BUN/Creatinine Ratio: 18 (ref 9–23)
BUN: 15 mg/dL (ref 6–24)
Basophils Absolute: 0 10*3/uL (ref 0.0–0.2)
Basos: 1 %
Bilirubin Total: 0.3 mg/dL (ref 0.0–1.2)
Calcium: 9.5 mg/dL (ref 8.7–10.2)
Chloride: 101 mmol/L (ref 96–106)
Chol/HDL Ratio: 2.4 ratio (ref 0.0–4.4)
Cholesterol, Total: 121 mg/dL (ref 100–199)
Creatinine, Ser: 0.83 mg/dL (ref 0.57–1.00)
EOS (ABSOLUTE): 0.2 10*3/uL (ref 0.0–0.4)
Eos: 3 %
Estimated CHD Risk: <0.5 times avg. (ref 0.0–1.0)
Free Thyroxine Index: 2.2 (ref 1.2–4.9)
GGT: 24 IU/L (ref 0–60)
Globulin, Total: 2 g/dL (ref 1.5–4.5)
Glucose: 92 mg/dL (ref 70–99)
HDL: 50 mg/dL (ref >39)
Hematocrit: 38.8 % (ref 34.0–46.6)
Hemoglobin: 12.4 g/dL (ref 11.1–15.9)
Immature Grans (Abs): 0 10*3/uL (ref 0.0–0.1)
Immature Granulocytes: 0 %
Iron: 77 ug/dL (ref 27–159)
LDH: 168 IU/L (ref 119–226)
LDL Chol Calc (NIH): 58 mg/dL (ref 0–99)
Lymphocytes Absolute: 2 10*3/uL (ref 0.7–3.1)
Lymphs: 36 %
MCH: 30.6 pg (ref 26.6–33.0)
MCHC: 32 g/dL (ref 31.5–35.7)
MCV: 96 fL (ref 79–97)
Monocytes Absolute: 0.6 10*3/uL (ref 0.1–0.9)
Monocytes: 10 %
Neutrophils Absolute: 2.9 10*3/uL (ref 1.4–7.0)
Neutrophils: 50 %
Phosphorus: 3.6 mg/dL (ref 3.0–4.3)
Platelets: 291 10*3/uL (ref 150–450)
Potassium: 4.3 mmol/L (ref 3.5–5.2)
RBC: 4.05 x10E6/uL (ref 3.77–5.28)
RDW: 12.7 % (ref 11.7–15.4)
Sodium: 138 mmol/L (ref 134–144)
T3 Uptake Ratio: 30 % (ref 24–39)
T4, Total: 7.2 ug/dL (ref 4.5–12.0)
TSH: 0.717 u[IU]/mL (ref 0.450–4.500)
Total Protein: 6.1 g/dL (ref 6.0–8.5)
Triglycerides: 59 mg/dL (ref 0–149)
Uric Acid: 3.2 mg/dL (ref 3.0–7.2)
VLDL Cholesterol Cal: 13 mg/dL (ref 5–40)
WBC: 5.7 10*3/uL (ref 3.4–10.8)
eGFR: 82 mL/min/{1.73_m2} (ref >59)

## 2023-08-15 LAB — HGB A1C W/O EAG: Hgb A1c MFr Bld: 5.8 % — ABNORMAL HIGH (ref 4.8–5.6)

## 2023-08-15 NOTE — Telephone Encounter (Signed)
My chart message sent to patient with lab results  Stephanie Chandler,  See your blood results copied from [facility] portal below.  They have not transferred to Epic yet.  Spot blood sugar, electrolytes, cholesterol, kidney/liver/thyroid function, iron and complete blood count normal  No anemia or infection noted on complete blood count.  Hgba1c elevated at 5.8 previous 5.6.  Continue healthy dietary choices, medicine, avoiding dehydration drinking water, keeping added sugars less than 35 grams per day (5 teaspoons) and activity after snacks/meals.  Please let us know if you have further questions or concerns.  See RN Burna Mortimer if you want printed copy of results.  Sincerely,  Albina Billet NP-C  Chemistries   Glucose  92 mg/dL 70 - 99  Uric Acid  3.2                           Therapeutic target for gout patients: <6.0 mg/dL 3.0 - 7.2  BUN  15 mg/dL 6 - 24  Creatinine  1.61 mg/dL 0.96 - 0.45  eGFR  82  mL/min/1.73   BUN/Creatinine Ratio  18 9 - 23  Sodium  138 mmol/L 134 - 144  Potassium  4.3 mmol/L 3.5 - 5.2  Chloride  101 mmol/L 96 - 106  Calcium  9.5 mg/dL 8.7 - 40.9  Phosphorus  3.6 mg/dL 3.0 - 4.3  Protein, Total  6.1 g/dL 6.0 - 8.5  Albumin  4.1 g/dL 3.8 - 4.9  Globulin, Total  2.0 g/dL 1.5 - 4.5  Bilirubin, Total  0.3 mg/dL 0.0 - 1.2  Alkaline Phosphatase  71 IU/L 44 - 121  LDH  168 IU/L 119 - 226  AST (SGOT)  26 IU/L 0 - 40  ALT (SGPT)  32 IU/L 0 - 32  GGT  24 IU/L 0 - 60  Iron  77 ug/dL 27 - 811  .   Lipids   Cholesterol, Total  121 mg/dL 914 - 782  Triglycerides  59 mg/dL 0 - 956  HDL Cholesterol  50  mg/dL   VLDL Cholesterol Cal  13 mg/dL 5 - 40  LDL Chol Calc (NIH)  58 mg/dL 0 - 99  T. Chol/HDL Ratio  2.4 ratio 0.0 - 4.4  Please Note:                                                   T. Chol/HDL Ratio                                                            Men  Women                                               1/2 Avg.Risk  3.4    3.3  Avg.Risk  5.0    4.4                                               2X Avg.Risk  9.6    7.1                                               3X Avg.Risk 23.4   11.0  Estimated CHD Risk  < 0.5                The CHD Risk is based on the T. Chol/HDL ratio. Other                factors affect CHD Risk such as hypertension, smoking,                diabetes, severe obesity, and family history of                premature CHD. times avg. 0.0 - 1.0  .   Thyroid   TSH  0.717 uIU/mL 0.450 - 4.500  Thyroxine (T4)  7.2 ug/dL 4.5 - 16.1  T3 Uptake  30 % 24 - 39  Free Thyroxine Index  2.2 1.2 - 4.9  .   CBC, Platelet Ct, and Diff   WBC  5.7 x10E3/uL 3.4 - 10.8  RBC  4.05 x10E6/uL 3.77 - 5.28  Hemoglobin  12.4 g/dL 09.6 - 04.5  Hematocrit  38.8 % 34.0 - 46.6  MCV  96 fL 79 - 97  MCH  30.6 pg 26.6 - 33.0  MCHC  32.0 g/dL 40.9 - 81.1  RDW  91.4 % 11.7 - 15.4  Platelets  291 x10E3/uL 150 - 450  Neutrophils  50  %  Not Estab.  Lymphs  36  %  Not Estab.  Monocytes  10  %  Not Estab.  Eos  3  %  Not Estab.  Basos  1  %  Not Estab.  Neutrophils (Absolute)  2.9 x10E3/uL 1.4 - 7.0  Lymphs (Absolute)  2.0 x10E3/uL 0.7 - 3.1  Monocytes(Absolute)  0.6 x10E3/uL 0.1 - 0.9  Eos (Absolute)  0.2 x10E3/uL 0.0 - 0.4  Baso (Absolute)  0.0 x10E3/uL 0.0 - 0.2  Immature Granulocytes  0  %  Not Estab.  Immature Grans (Abs)  0.0 x10E3/uL 0.0 - 0.1 Hemoglobin A1c (#[id number])  Test Results Flag Units Reference Interval  Hemoglobin A1c  5.8 High % 4.8 - 5.6  Please Note:                                                         .          Prediabetes: 5.7 - 6.4          Diabetes: >6.4          Glycemic control for adults with diabetes: <7.0

## 2023-08-29 ENCOUNTER — Telehealth: Payer: Self-pay | Admitting: Registered Nurse

## 2023-08-29 ENCOUNTER — Encounter: Payer: Self-pay | Admitting: Registered Nurse

## 2023-08-29 DIAGNOSIS — G8929 Other chronic pain: Secondary | ICD-10-CM

## 2023-08-29 DIAGNOSIS — M549 Dorsalgia, unspecified: Secondary | ICD-10-CM

## 2023-08-29 NOTE — Telephone Encounter (Signed)
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  Wondering if clinic or Psychologist, occupational work could order back braces to help her with lifting.  Discussed with patient/supervisor back braces have not shown to help back pain but stretching and core strengthening more helpful with preventing back pain and injury.  [facility]care handout on core strengthening exercises sent to patient my chart.  Recommended holding loads close to body, alternating heavy versus light work if possible, hourly stretch break, biofreeze topical QID prn, heat/ice prn, epsom salt soak bath after work.  Follow up with clinic for evaluation if worsening symptoms e.g. loss of bowel/bladder control, saddle paresthesias or arm/leg weakness dropping items/falls.  Supervisor Scientist, forensic Onalee Hua notified back brace use not recommended medically.

## 2023-09-04 ENCOUNTER — Other Ambulatory Visit: Payer: Self-pay | Admitting: Orthopedic Surgery

## 2023-09-04 DIAGNOSIS — Z96652 Presence of left artificial knee joint: Secondary | ICD-10-CM

## 2023-09-05 NOTE — Telephone Encounter (Signed)
Spoke with patient in workcenter stated she had not seen my chart message or exercises but supervisor did speak with her.  She stated she understood information. Discussed again back braces do not help to prevent injury and can end up weakening back/core muscles with chronic use. Denied back pain today and stated she would review core/back strengthening exercises and follow ergonomic lifting instructions and follow up with clinic staff as needed if back pain occurs.  Patient also reported she is having second opinion for knee pain after replacement with another orthopedics provider.  Bone scan scheduled and she was started on celebrex which seems to be helping more with pain but didn't completely resolve it.  Discussed with patient avoid nsaid use e.g. motrin/advil/aleve/ibuprofen/naproxen/naprosyn/voltaren/diclofenac/mobic/meloxicam while taking celebrex.  Encouraged patient to keep her bone scan appt and follow up with orthopedics.  Patient frustrated 1 year after knee replacement still having pain.  Patient had no further questions, agreed with plan of care.

## 2023-09-11 ENCOUNTER — Encounter: Payer: Self-pay | Admitting: Registered Nurse

## 2023-09-11 ENCOUNTER — Other Ambulatory Visit: Payer: Self-pay | Admitting: Registered Nurse

## 2023-09-11 DIAGNOSIS — K5904 Chronic idiopathic constipation: Secondary | ICD-10-CM | POA: Insufficient documentation

## 2023-09-11 DIAGNOSIS — R1111 Vomiting without nausea: Secondary | ICD-10-CM | POA: Insufficient documentation

## 2023-09-11 DIAGNOSIS — K429 Umbilical hernia without obstruction or gangrene: Secondary | ICD-10-CM | POA: Insufficient documentation

## 2023-09-11 DIAGNOSIS — F4321 Adjustment disorder with depressed mood: Secondary | ICD-10-CM | POA: Insufficient documentation

## 2023-09-11 DIAGNOSIS — R110 Nausea: Secondary | ICD-10-CM | POA: Insufficient documentation

## 2023-09-11 DIAGNOSIS — E7800 Pure hypercholesterolemia, unspecified: Secondary | ICD-10-CM | POA: Insufficient documentation

## 2023-09-11 DIAGNOSIS — K5900 Constipation, unspecified: Secondary | ICD-10-CM | POA: Insufficient documentation

## 2023-09-11 DIAGNOSIS — G8929 Other chronic pain: Secondary | ICD-10-CM

## 2023-09-11 DIAGNOSIS — M25511 Pain in right shoulder: Secondary | ICD-10-CM

## 2023-09-11 DIAGNOSIS — R109 Unspecified abdominal pain: Secondary | ICD-10-CM | POA: Insufficient documentation

## 2023-09-11 DIAGNOSIS — M5441 Lumbago with sciatica, right side: Secondary | ICD-10-CM | POA: Insufficient documentation

## 2023-09-11 DIAGNOSIS — Z96652 Presence of left artificial knee joint: Secondary | ICD-10-CM

## 2023-09-11 NOTE — Progress Notes (Signed)
Subjective:    Patient ID: Stephanie Chandler, female    DOB: 04/02/1966, 57 y.o.   MRN: [id number]  57y/o african [place] female established patient here for ESR draw for [facility] appt next week.  Also has questions regarding what exercises she should be doing at the gym and how to control her pain.  Taking celebrex and tramadol at bedtime and waking up a few hours later and needing to take tylenol also.  Has tried heat/ice without relief.  Knee and shoulder/neck pain.  Denied known injury.  Had left knee replacement over a year ago and never was pain free after surgery seeing new orthopedic provider for second opinion.  Stopped going to gym because she didn't know what exercises worsening or improving things.  Has had PT course.  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  Bone scan scheduled along with labs and follow up orthopedics appt.  Patient on wegovy and having success with weight loss through [facility] office.  Does typically hold phone after work to read [employer] to relax  Has tried epsom salt baths daily after work but doesn't seem to be helping this week regarding pain.      Review of Systems  Constitutional:  Negative for chills and fever.  HENT:  Negative for trouble swallowing and voice change.   Eyes:  Negative for photophobia and visual disturbance.  Respiratory:  Negative for cough, shortness of breath, wheezing and stridor.   Cardiovascular:  Negative for chest pain.  Gastrointestinal:  Negative for diarrhea, nausea and vomiting.  Genitourinary:  Negative for difficulty urinating.  Musculoskeletal:  Positive for arthralgias, gait problem, myalgias and neck pain. Negative for neck stiffness.  Skin:  Negative for color change and rash.  Allergic/Immunologic: Positive for environmental allergies.  Neurological:  Negative for dizziness,  tremors, syncope, speech difficulty, weakness and light-headedness.  Psychiatric/Behavioral:  Positive for sleep disturbance. Negative for agitation and confusion.        Objective:   Physical Exam Vitals and nursing note reviewed.  Constitutional:      General: She is awake. She is not in acute distress.    Appearance: Normal appearance. She is well-developed, well-groomed and overweight. She is not ill-appearing, toxic-appearing or diaphoretic.  HENT:     Head: Normocephalic and atraumatic.     Jaw: There is normal jaw occlusion.     Salivary Glands: Right salivary gland is not diffusely enlarged. Left salivary gland is not diffusely enlarged.     Right Ear: Hearing and external ear normal. No decreased hearing noted.     Left Ear: Hearing and external ear normal. No decreased hearing noted.     Nose: Nose normal. No congestion or rhinorrhea.     Mouth/Throat:     Lips: Pink. No lesions.     Mouth: Mucous membranes are moist. No oral lesions or angioedema.     Dentition: No gum lesions.     Tongue: No lesions.     Pharynx: Oropharynx is clear.  Eyes:     General: Lids are normal. Vision grossly intact. Gaze aligned appropriately. Allergic shiner present. No scleral icterus.       Right eye: No discharge.        Left eye: No discharge.     Extraocular Movements: Extraocular movements intact.     Conjunctiva/sclera: Conjunctivae normal.  Pupils: Pupils are equal, round, and reactive to light.  Neck:     Trachea: Trachea and phonation normal.     Comments: Bilateral trapezius tight at baseline AROM today Cardiovascular:     Rate and Rhythm: Normal rate and regular rhythm.     Pulses: Normal pulses.          Radial pulses are 2+ on the right side and 2+ on the left side.  Pulmonary:     Effort: Pulmonary effort is normal. No respiratory distress.     Breath sounds: Normal breath sounds and air entry. No stridor or transmitted upper airway sounds. No wheezing.     Comments:  Spoke full sentences without difficulty; no cough observed in exam room Abdominal:     Palpations: Abdomen is soft.  Musculoskeletal:     Right shoulder: No swelling, deformity, effusion, laceration or crepitus. Normal range of motion. Normal strength.     Left shoulder: No swelling, deformity, effusion, laceration or crepitus. Normal range of motion. Normal strength.     Right hand: Normal strength. Normal capillary refill.     Left hand: Normal strength. Normal capillary refill.       Arms:     Cervical back: Normal range of motion and neck supple. Tenderness present. No swelling, edema, deformity, erythema, signs of trauma, lacerations, rigidity, spasms, torticollis or crepitus. Pain with movement and muscular tenderness present.     Thoracic back: No swelling, edema, deformity, signs of trauma or lacerations.       Back:       Legs:     Comments: Scapular and trapezius pain at rest and worsens with AROM right; bilateral trapezius tight; shoulder pendulums and shoulder rolls tolerable; arm circles flare up pain and patient stated very painful right  Lymphadenopathy:     Head:     Right side of head: No submandibular or preauricular adenopathy.     Left side of head: No submandibular or preauricular adenopathy.     Cervical: No cervical adenopathy.     Right cervical: No superficial cervical adenopathy.    Left cervical: No superficial cervical adenopathy.  Skin:    General: Skin is warm and dry.     Capillary Refill: Capillary refill takes less than 2 seconds.     Coloration: Skin is not ashen, cyanotic, jaundiced, mottled, pale or sallow.     Findings: No abrasion, bruising, burn, erythema, signs of injury, laceration, petechiae, rash or wound.  Neurological:     General: No focal deficit present.     Mental Status: She is alert and oriented to person, place, and time. Mental status is at baseline.     Cranial Nerves: No cranial nerve deficit.     Motor: Motor function is intact.  No weakness, tremor, abnormal muscle tone or seizure activity.     Coordination: Coordination is intact. Coordination normal.     Gait: Gait is intact. Gait normal.     Comments: In/out of chair without difficulty; gait sure and steady in clinic; bilateral hand grasp equal 5/5  Psychiatric:        Attention and Perception: Attention and perception normal.        Mood and Affect: Mood and affect normal.        Speech: Speech normal.        Behavior: Behavior normal. Behavior is cooperative.        Thought Content: Thought content normal.        Cognition and Memory:  Cognition and memory normal.        Judgment: Judgment normal.           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  Discussed with patient do not double celebrex dose as 200mg  max dose.  Discussed with patient try not holding phone 2 hours prior to bed but use stand/prop it up on something instead.  Discussed neck/arm/shoulder ergonomics for reading/phone use after work.  Carrying loads at work close to body.  Stretches hourly at work for neck/shoulder/hip/knee/back/position changes.  Discussed with patient I recommend heated pool/swimming, gentle seated yoga, tai chi as possible exercise choices. Gentle arom on bike with low resistance may also be helpful.  Do not recommend standing weight lifting e.g. squats/lunges at this time.  Consider massage or self massage with tennis ball/lacrosse ball.   Discussed [employer] has contract with gyms discuss with HR Tonya new website as insurance updated 28 Aug 2023.  Keep appts for bone scan and orthopedics follow up. Discussed arthritis pain can flare with cold weather apply heat and gentle AROM all help with pain/stiffness.  May  continue epsom salt soaks.  ESR sent to [facility] today and on requisition results to be sent to orthopedics Dr Fayrene Fearing also.  Venipuncture x 1 right AC gauze and cobain applied discussed remove in 15 minutes  Bandage clean dry and intact on ambulatory discharge.  Continue weight loss efforts.  Congratulated patient on her weight loss this year.  Patient agreed with plan of care and verbalized understanding of information/instructions.

## 2023-09-11 NOTE — Patient Instructions (Signed)
Chronic Pain, Adult Chronic pain is a type of pain that lasts or keeps coming back for at least 3-6 months. You may have headaches, pain in the abdomen, or pain in other areas of the body. Chronic pain may be related to an illness, injury, or a health condition. Sometimes, the cause of chronic pain is not known. Chronic pain can make it hard for you to do daily activities. If it is not treated, chronic pain can lead to anxiety and depression. Treatment depends on the cause of your pain and how severe it is. You may need to work with a pain specialist to come up with a treatment plan. Many people benefit from two or more types of treatment to control their pain. Follow these instructions at home: Treatment plan Follow your treatment plan as told by your health care provider. This may include: Gentle, regular exercise. Eating a healthy diet that includes foods such as vegetables, fruits, fish, and lean meats. Mental health therapy (cognitive or behavioral therapy) that changes the way you think or act in response to the pain. This may help improve how you feel. Doing physical therapy exercises to improve movement and strength. Meditation, yoga, acupuncture, or massage therapy. Using the oils from plants in your environment or on your skin (aromatherapy). Other treatments may include: Over-the-counter or prescription medicines. Color, light, or sound therapy. Local electrical stimulation. The electrical pulses help to relieve pain by temporarily stopping the nerve impulses that cause you to feel pain. Injections. These deliver numbing or pain-relieving medicines into the spine or the area of pain.  Medicines Take over-the-counter and prescription medicines only as told by your health care provider. Ask your health care provider if the medicine prescribed to you: Requires you to avoid driving or using machinery. Can cause constipation. You may need to take these actions to prevent or treat  constipation: Drink enough fluid to keep your urine pale yellow. Take over-the-counter or prescription medicines. Eat foods that are high in fiber, such as beans, whole grains, and fresh fruits and vegetables. Limit foods that are high in fat and processed sugars, such as fried or sweet foods. Lifestyle  Ask your health care provider whether you should keep a pain diary. Your health care provider will tell you what information to write in the diary. This may include: When you have pain. What the pain feels like. How medicines and other behaviors or treatments help to reduce the pain. Consider talking with a mental health care provider about how to help manage chronic pain. Consider joining a chronic pain support group. Try to control or lower your stress levels. Talk with your health care provider about ways to do this. General instructions Learn as much as you can about how to manage your chronic pain. Ask your health care provider if an intensive pain rehabilitation program or a chronic pain specialist would be helpful. Check your pain level as told by your health care provider. Ask your health care provider if you should use a pain scale. Contact a health care provider if: Your pain is not controlled with treatment. You have new pain. You have side effects from pain medicine. You feel weak or you have trouble doing your normal activities. You have trouble sleeping or you develop confusion. You lose feeling or have numbness in your body. You lose control of your bowels or bladder. Get help right away if: Your pain suddenly gets much worse. You develop chest pain. You have trouble breathing or shortness of  breath. You faint, or another person sees you faint. These symptoms may be an emergency. Get help right away. Call 911. Do not wait to see if the symptoms will go away. Do not drive yourself to the hospital. Also, get help right away if: You have thoughts about hurting yourself  or others. Take one of these steps if you feel like you may hurt yourself or others, or have thoughts about taking your own life: Go to your nearest emergency room. Call 911. Call the National Suicide Prevention Lifeline at 737-806-8544 or 988. This is open 24 hours a day. Text the Crisis Text Line at 775-339-2349. This information is not intended to replace advice given to you by your health care provider. Make sure you discuss any questions you have with your health care provider. Document Revised: 07/05/2022 Document Reviewed: 06/07/2022 Elsevier Patient Education  2024 [employer].

## 2023-09-12 LAB — SEDIMENTATION RATE: Sed Rate: 11 mm/h (ref 0–40)

## 2023-09-13 ENCOUNTER — Encounter
Admission: RE | Admit: 2023-09-13 | Discharge: 2023-09-13 | Disposition: A | Discharge status: Home / Self Care | Payer: No Typology Code available for payment source | Source: Ambulatory Visit | Attending: Orthopedic Surgery | Admitting: Orthopedic Surgery

## 2023-09-13 ENCOUNTER — Ambulatory Visit
Admission: RE | Admit: 2023-09-13 | Discharge: 2023-09-13 | Disposition: A | Discharge status: Home / Self Care | Payer: No Typology Code available for payment source | Source: Ambulatory Visit | Attending: Orthopedic Surgery | Admitting: Orthopedic Surgery

## 2023-09-13 DIAGNOSIS — Z96652 Presence of left artificial knee joint: Secondary | ICD-10-CM

## 2023-09-13 MED ORDER — TECHNETIUM TC 99M MEDRONATE IV KIT
20.0000 | PACK | Freq: Once | INTRAVENOUS | Status: AC | PRN
  Administered 2023-09-13: 20.6 via INTRAVENOUS

## 2023-09-17 ENCOUNTER — Ambulatory Visit: Payer: No Typology Code available for payment source

## 2023-10-03 ENCOUNTER — Telehealth: Payer: Self-pay | Admitting: Registered Nurse

## 2023-10-03 ENCOUNTER — Encounter: Payer: Self-pay | Admitting: Registered Nurse

## 2023-10-03 DIAGNOSIS — G8929 Other chronic pain: Secondary | ICD-10-CM

## 2023-10-03 NOTE — Telephone Encounter (Signed)
Patient having leg pain at work today requested thermacare patch as helps from clinic stock given 1 for use today.  Has follow up scheduled with second opinion orthopedics provider.  Had bone scan completed and ESR as ordered by that provider.

## 2023-10-04 NOTE — Telephone Encounter (Signed)
Patient reported waiting for insurance approval lidocaine patches at this time.  Discussed ensure pharmacy has current insurance card information rxbin/number etc as new cards 28 Aug 2023  Patient requested another thermacare patch for lateral left leg pain working well for her.  Discussed bone scan results and orthopedics note instructions regarding lidocaine patches  Assessment Encounter Date Assessment Date Assessment LastModified by Organization Details LastModified Time  09/17/2023 09/17/2023 IMPRESSION: Cutaneous neuralgia, left knee.  DISPOSITION: Diagnoses, x-rays, and treatment discussed with the patient. I recommend a trial of lidocaine patches and scar desensitization techniques. I expect her symptoms to improve. If her pain worsens, she will call the office.     Patient also scheduled flu vaccine for next week.

## 2023-10-15 IMAGING — RF DG KNEE 1-2V*L*
1 series · 2 of 2 positions shown · non-contrast
Comparison: MRI 12/23/2021

CLINICAL DATA: Total knee arthroplasty

EXAM:
LEFT KNEE - 1-2 VIEW

[Series 1: run · 2 of 2 slices shown]
[im 1/2]
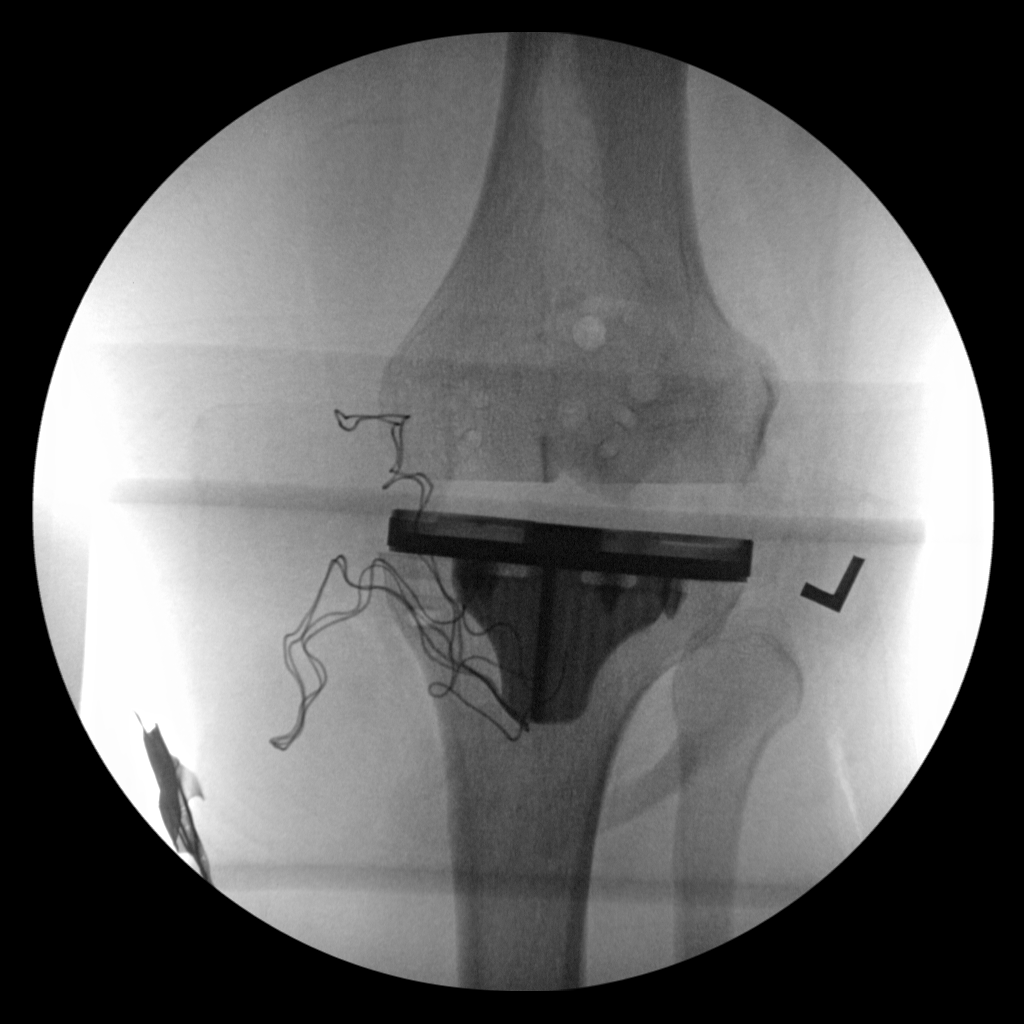
[im 2/2]
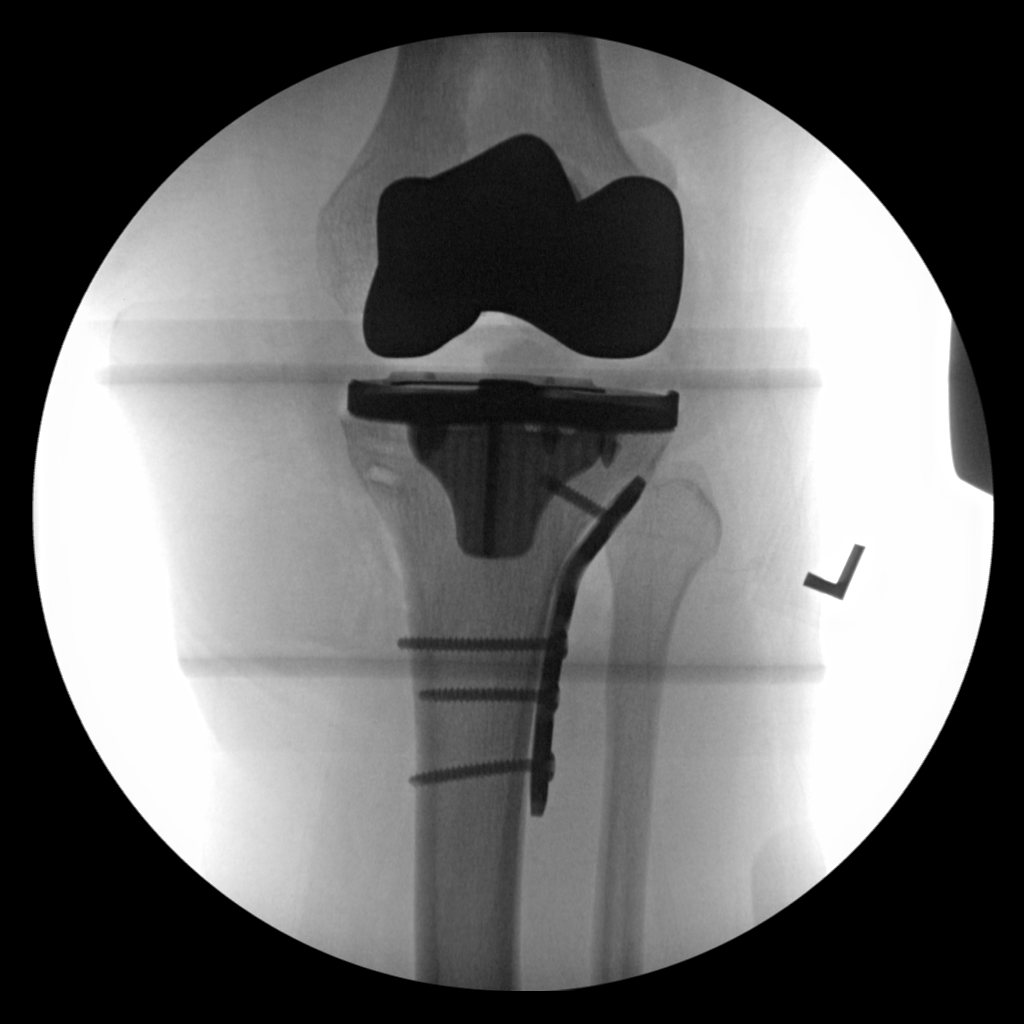

[2 of 2 positions shown; findings below may reference images not displayed]

FINDINGS: Two intraoperative images demonstrate total knee arthroplasty and
medial proximal tibial plate and screw fixation.
IMPRESSION: Intraoperative imaging.

## 2023-10-15 IMAGING — DX DG KNEE 1-2V*L*
2 series · 2 of 2 positions shown · non-contrast
Comparison: None.

CLINICAL DATA: Postop from left knee arthroplasty.

EXAM:
LEFT KNEE - 2 VIEW

[knee ap]
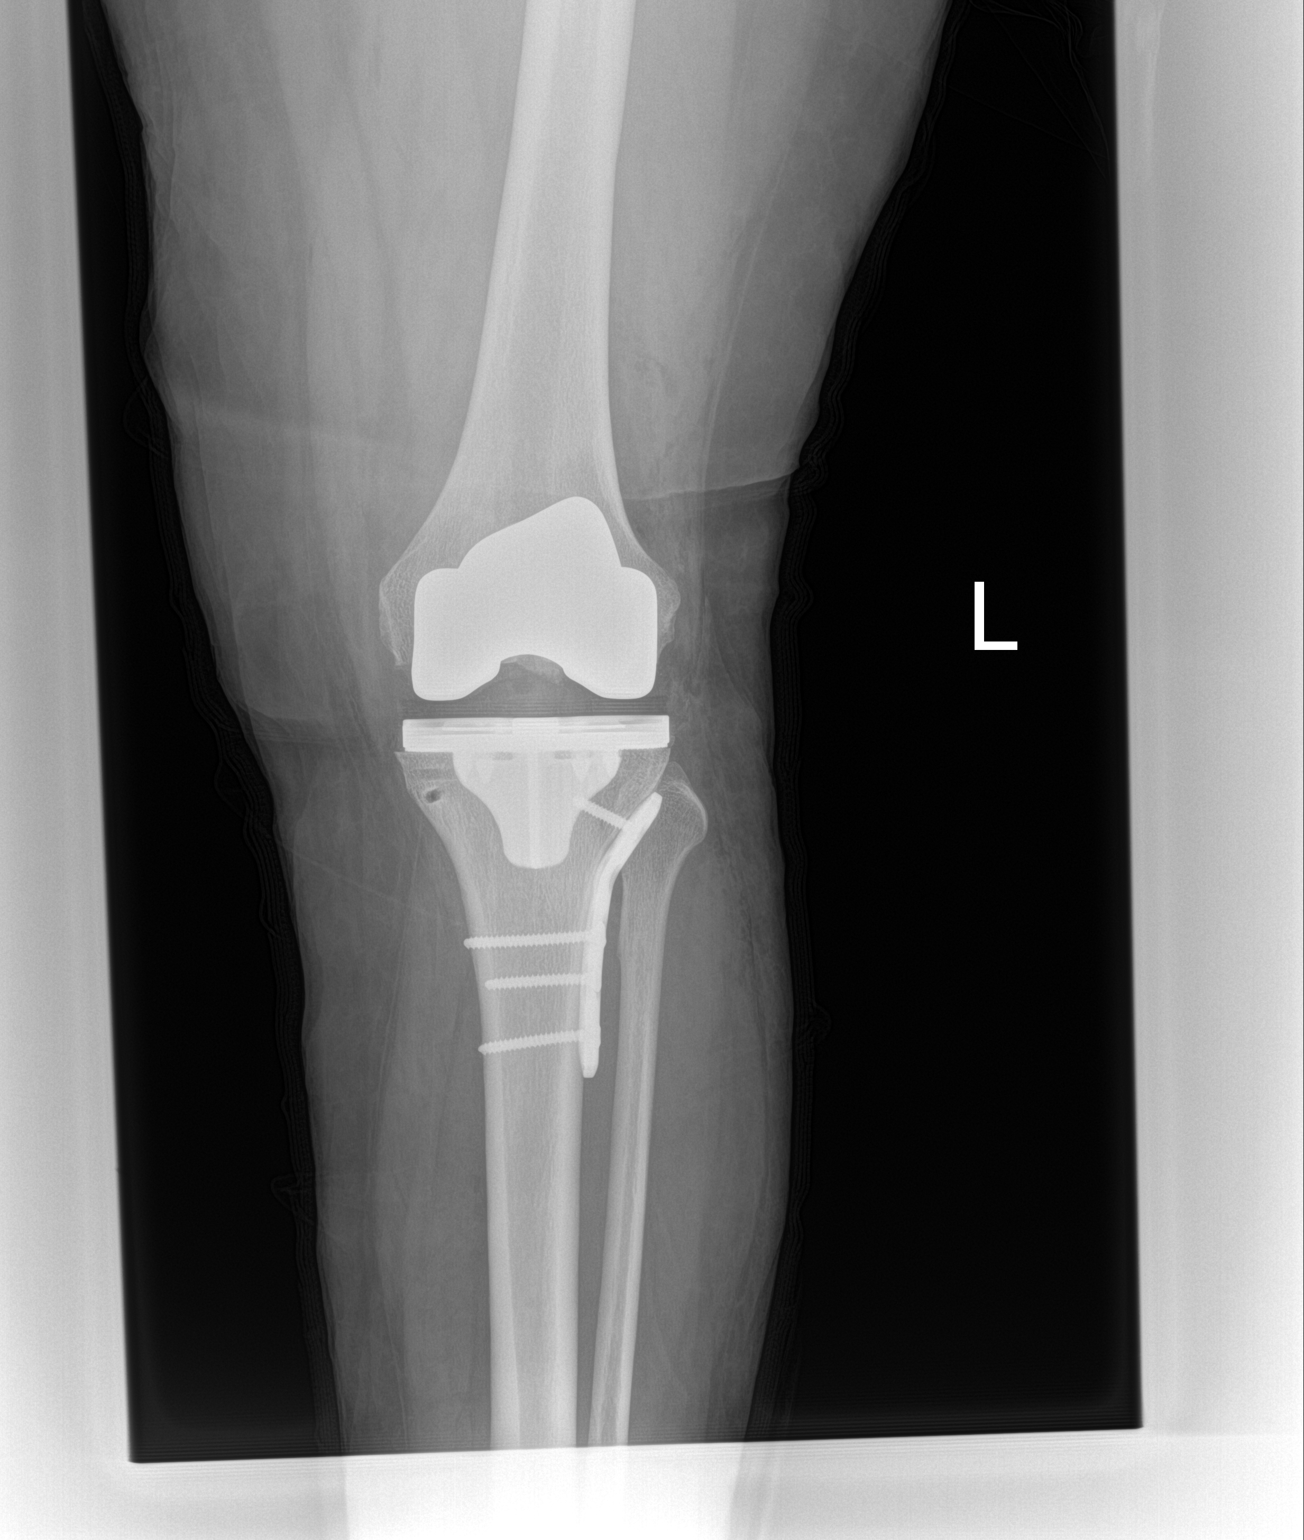

[knee lat]
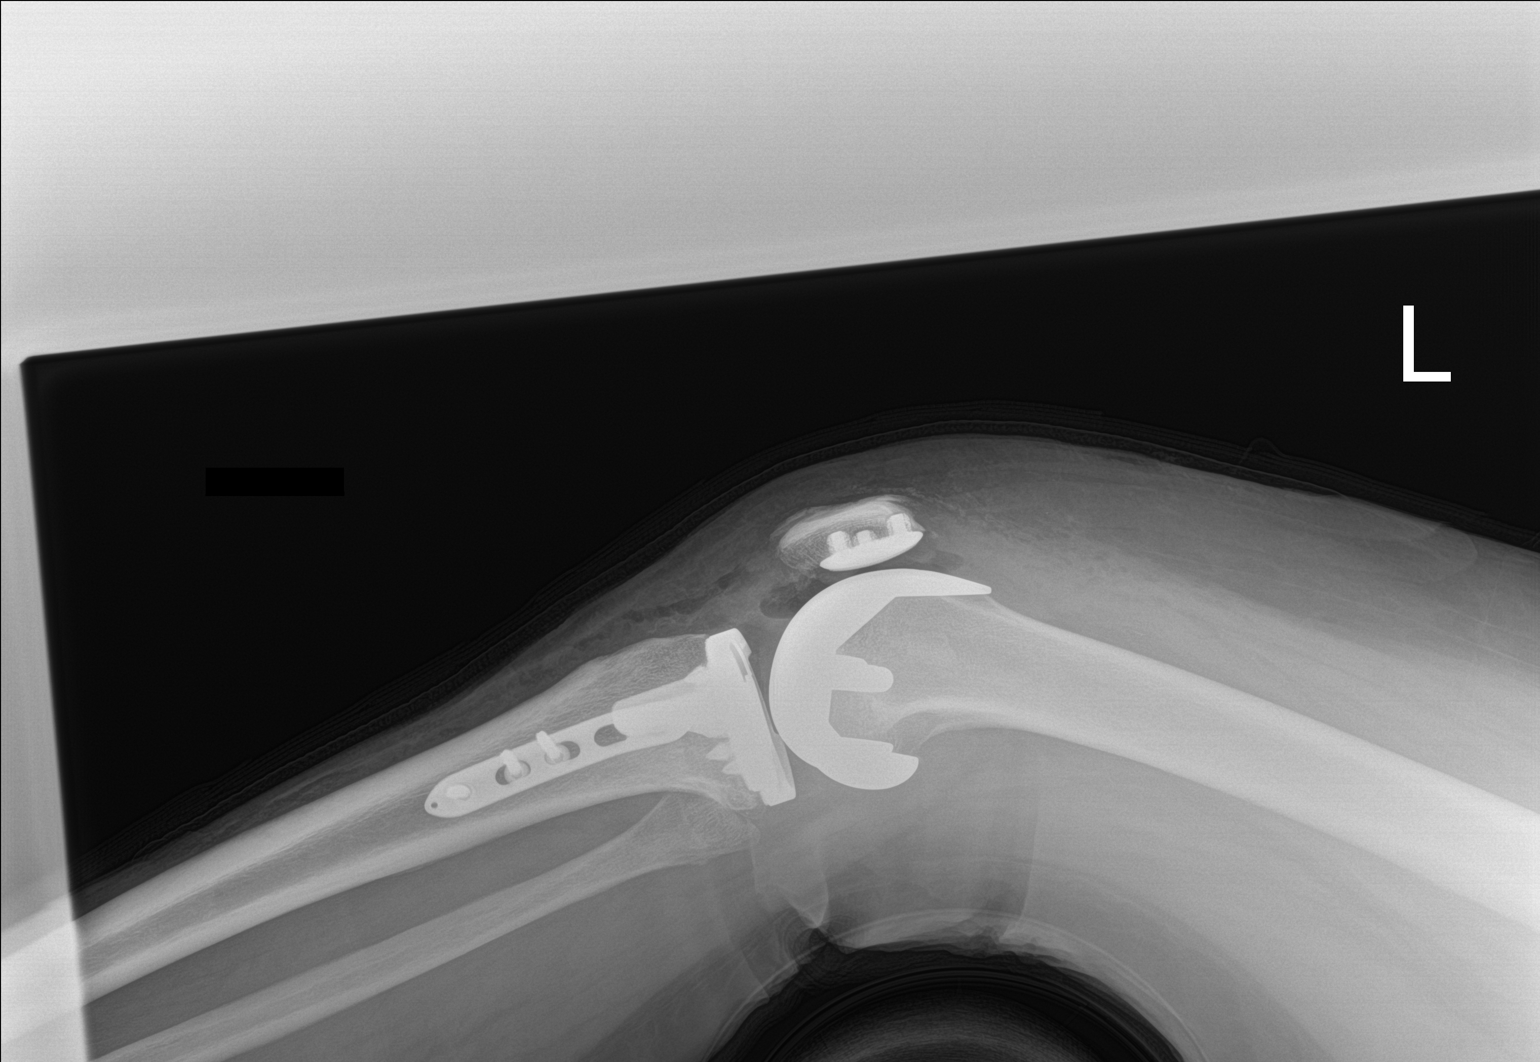

[2 of 2 positions shown; findings below may reference images not displayed]

FINDINGS: Tricompartmental left knee prosthesis is seen with all 3 components
in expected position. Fixation plate and screws are also seen within
the proximal tibial metaphysis. No evidence of fracture or
dislocation.
IMPRESSION: Expected postop appearance of left knee prosthesis. No acute
findings.

## 2023-10-18 ENCOUNTER — Ambulatory Visit: Payer: Self-pay | Admitting: Registered Nurse

## 2023-10-18 ENCOUNTER — Ambulatory Visit: Payer: No Typology Code available for payment source | Admitting: *Deleted

## 2023-10-18 DIAGNOSIS — Z23 Encounter for immunization: Secondary | ICD-10-CM

## 2023-10-18 NOTE — Progress Notes (Signed)
Patient decided to not get covid vaccine today and only influenze with RN Shanda Bumps.  Discussed with patient if she changes her mind to let us know that she is due for fall booster covid.  Covid VIS given to patient. Discussed with patient she may need to schedule covid with PCM/pharmacy as HR has not approved for further covid vaccine purchases once we run out of this supply anticipated next week. Patient verbalized understanding information and had no further questions at this time.

## 2023-10-18 NOTE — Patient Instructions (Signed)
 COVID-19 Vaccine: What You Need to Know Many vaccine information statements are available in Spanish and other languages. See PromoAge.com.br. 1. Why get vaccinated? COVID-19 vaccine can prevent COVID-19 disease. Vaccination can help reduce the severity of COVID-19 disease if you get sick. COVID-19 is caused by a coronavirus called SARS-CoV-2 that spreads easily from person to person. COVID-19 can cause mild to moderate illness lasting only a few days, or severe illness requiring hospitalization, intensive care, or a ventilator to help with breathing. COVID-19 can result in death. If an infected person has symptoms, they may appear 2 to 14 days after exposure to the virus. Anyone can have mild to severe symptoms. Possible symptoms include fever or chills, cough, shortness of breath or difficulty breathing, fatigue (tiredness), muscle or body aches, headache, new loss of taste or smell, sore throat, congestion or runny nose, nausea or vomiting, or diarrhea. More serious symptoms can include trouble breathing, persistent pain or pressure in the chest, new confusion, inability to wake or stay awake, or pale, gray, or blue-colored skin, lips, or nail beds, depending on skin tone. Older adults and people with certain underlying medical conditions (like heart or lung disease or diabetes) are more likely to get very sick from COVID-19. 2. COVID-19 vaccine Updated (2023-2024 Formula) COVID-19 vaccine is recommended for everyone 58 months of age and older. COVID-19 vaccines for infants and children 6 months through 46 years of age are available under Emergency Use Authorization from the U. S. Food and Drug Administration (FDA). Please refer to the Fact Sheets for Recipients and Caregivers for more information. For people 58 years of age and older, updated COVID-19 vaccines, manufactured by [employer]. or [employer], [employer] are approved by [employer]. Everyone 12 years and older should get 1 dose of an FDA-approved,  updated 2023-2024 COVID-19 vaccine. If you have received a COVID-19 vaccine recently, you should wait at least 8 weeks after your most recent dose to get the updated 2023-2024 COVID-19 vaccine. Certain people who have medical conditions or are taking medications that affect the immune system may get additional doses of COVID-19 vaccine. Your health care provider can advise you. Some people 58 years of age and older might get a different COVID-19 vaccine called Novavax COVID-19 Vaccine, Adjuvanted (2023-2024 Formula) instead. This vaccine is available under Emergency Use Authorization from FDA. Please refer to the Fact Sheet for Recipients and Caregivers for more information. 3. Talk with your health care provider Tell your vaccination provider if the person getting the vaccine: Has had an allergic reaction after a previous dose of COVID-19 vaccine or an ingredient in the COVID-19 vaccine, or has any severe, life-threatening allergies Has had myocarditis (inflammation of the heart muscle) or pericarditis (inflammation of the lining outside of the heart) Has had multisystem inflammatory syndrome (called MIS-C in children and MIS-A in adults) Has a weakened immune system In some cases, your health care provider may decide to postpone COVID-19 vaccination until a future visit. People with minor illnesses, such as a cold, may be vaccinated. People who are moderately or severely ill should usually wait until they recover. People with current COVID-19 infection should wait to get vaccinated until they have recovered from their illness and discontinued isolation. Pregnant people with COVID-19 are at increased risk for severe illness. COVID-19 vaccination is recommended for people who are pregnant, breastfeeding, or trying to get pregnant now, or who might become pregnant in the future. COVID-19 vaccine may be given at the same time as other vaccines. 4. Risks of  a vaccine reaction Pain, swelling, or redness  where the shot is given, fever, tiredness (fatigue), headache, chills, muscle pain, joint pain, nausea, vomiting, and swollen lymph nodes can happen after COVID-19 vaccination. Myocarditis (inflammation of the heart muscle) or pericarditis (inflammation of the lining outside the heart) have been seen rarely after COVID-19 vaccination. This risk has been observed most commonly in males 58 through 58 years of age. The chance of this occurring is low. People sometimes faint after medical procedures, including vaccination. Tell your provider if you feel dizzy or have vision changes or ringing in the ears. As with any medicine, there is a very remote chance of a vaccine causing a severe allergic reaction, other serious injury, or death. 5. What if there is a serious problem? An allergic reaction could occur after the vaccinated person leaves the clinic. If you see signs of a severe allergic reaction (hives, swelling of the face and throat, difficulty breathing, a fast heartbeat, dizziness, or weakness), call 9-1-1 and get the person to the nearest hospital. Seek medical attention right away if the vaccinated person experiences chest pain, shortness of breath, or feelings of having a fast-beating, fluttering, or pounding heart after COVID-19 vaccination. These could be symptoms of myocarditis or pericarditis. For other signs that concern you, call your health care provider. Adverse reactions should be reported to the Vaccine Adverse Event Reporting System (VAERS). Your health care provider will usually file this report, or you can do it yourself. Visit the VAERS website at www.vaers.LAgents.no or call 848-627-5235. VAERS is only for reporting reactions, and VAERS staff members do not give medical advice. 6. Countermeasures Injury Compensation Program The Countermeasures Injury Compensation Program (CICP) is a federal program that may help pay for costs of medical care and other specific expenses of certain people  who have been seriously injured by certain medicines or vaccines, including this vaccine. Generally, a claim must be submitted to the CICP within one (1) year from the date of receiving the vaccine. To learn more about this program, visit the program's website at NoSpeaking.tn, or call (415) 840-4599. 7. How can I learn more? Ask your health care provider. Call your local or state health department. Visit the website of the Food and Drug Administration (FDA) for COVID-19 Fact Sheets, package inserts, and additional information at CartCleaning.be. Contact the Centers for Disease Control and Prevention (CDC): Call (909) 258-4166 (1-800-CDC-INFO) or Visit CDC's COVID-19 vaccines website at https://www.clark-whitaker.org/ Source: CDC Vaccine Information Statement COVID-19 Vaccine (09/14/2022) This same material is available at FootballExhibition.com.br for no charge. This information is not intended to replace advice given to you by your health care provider. Make sure you discuss any questions you have with your health care provider. Document Revised: 02/28/2023 Document Reviewed: 11/30/2022 Elsevier Patient Education  2024 [employer].

## 2023-10-23 ENCOUNTER — Encounter: Payer: Self-pay | Admitting: Registered Nurse

## 2023-10-23 ENCOUNTER — Telehealth: Payer: Self-pay | Admitting: Registered Nurse

## 2023-10-23 DIAGNOSIS — J209 Acute bronchitis, unspecified: Secondary | ICD-10-CM

## 2023-10-23 NOTE — Telephone Encounter (Signed)
Congestion and runny nose requested medication from clinic stock that can help today

## 2023-11-01 ENCOUNTER — Ambulatory Visit: Payer: No Typology Code available for payment source | Admitting: Registered Nurse

## 2023-11-01 ENCOUNTER — Encounter: Payer: Self-pay | Admitting: Registered Nurse

## 2023-11-01 ENCOUNTER — Other Ambulatory Visit: Payer: Self-pay

## 2023-11-01 VITALS — BP 120/70 | HR 95 | Temp 97.3°F

## 2023-11-01 MED ORDER — FLUTICASONE PROPIONATE 50 MCG/ACT NA SUSP
1.0000 | Freq: Two times a day (BID) | NASAL | 6 refills | Status: DC
  Filled 2023-11-01: qty 16, 30d supply, fill #0

## 2023-11-01 NOTE — Telephone Encounter (Signed)
Patient seen in clinic today viral URI diagnosed see office note

## 2023-11-01 NOTE — Progress Notes (Signed)
Subjective:    Patient ID: Stephanie Chandler, female    DOB: 12-25-1965, 57 y.o.   MRN: [id number]  57y/o established african [place] female here for congestion/cough denied sick contacts has not performed home covid test  Denied fever/n/v/d/chills.  Mucous clear        Review of Systems  Constitutional:  Positive for fatigue. Negative for chills and fever.  HENT:  Positive for postnasal drip and rhinorrhea. Negative for ear discharge, ear pain, nosebleeds, sinus pressure, sinus pain, sore throat, tinnitus, trouble swallowing and voice change.   Eyes:  Negative for photophobia and visual disturbance.  Respiratory:  Positive for cough. Negative for choking and stridor.   Cardiovascular:  Negative for chest pain and palpitations.  Gastrointestinal:  Negative for diarrhea, nausea and vomiting.  Genitourinary:  Negative for difficulty urinating.  Musculoskeletal:  Positive for arthralgias. Negative for back pain, gait problem, neck pain and neck stiffness.  Neurological:  Negative for dizziness, tremors, seizures, syncope, facial asymmetry, speech difficulty, weakness, light-headedness, numbness and headaches.  Hematological:  Negative for adenopathy. Does not bruise/bleed easily.  Psychiatric/Behavioral:  Negative for agitation, confusion and sleep disturbance.        Objective:   Physical Exam Vitals and nursing note reviewed.  Constitutional:      General: She is awake. She is not in acute distress.    Appearance: Normal appearance. She is well-developed, well-groomed and normal weight. She is not ill-appearing, toxic-appearing or diaphoretic.  HENT:     Head: Normocephalic and atraumatic.     Jaw: There is normal jaw occlusion. No trismus.     Salivary Glands: Right salivary gland is not diffusely enlarged or tender. Left salivary gland is not diffusely enlarged or tender.     Right Ear: Hearing, ear canal and external ear normal. No decreased hearing noted. No laceration, drainage,  swelling or tenderness. A middle ear effusion is present. There is no impacted cerumen. No foreign body. No mastoid tenderness. No PE tube. No hemotympanum. Tympanic membrane is not injected, scarred, perforated, erythematous, retracted or bulging.     Left Ear: Hearing and external ear normal. No decreased hearing noted. No laceration, drainage, swelling or tenderness. A middle ear effusion is present. There is no impacted cerumen. No foreign body. No mastoid tenderness. No PE tube. No hemotympanum. Tympanic membrane is not injected, scarred, perforated, erythematous, retracted or bulging.     Ears:     Comments: Left auditory canal with erythema no debris noted bilateral canals; air fluid level clear bilateral TMs intact    Nose: Mucosal edema, congestion and rhinorrhea present. No nasal deformity, septal deviation or laceration. Rhinorrhea is clear.     Right Turbinates: Enlarged and swollen. Not pale.     Left Turbinates: Enlarged and swollen. Not pale.     Right Sinus: No maxillary sinus tenderness or frontal sinus tenderness.     Left Sinus: No maxillary sinus tenderness or frontal sinus tenderness.     Mouth/Throat:     Lips: Pink. No lesions.     Mouth: Mucous membranes are moist. Mucous membranes are not pale, not dry and not cyanotic. No lacerations, oral lesions or angioedema.     Dentition: No dental abscesses or gum lesions.     Tongue: No lesions. Tongue does not deviate from midline.     Palate: No mass and lesions.     Pharynx: Uvula midline. Pharyngeal swelling, posterior oropharyngeal erythema and postnasal drip present. No oropharyngeal exudate or uvula swelling.  Tonsils: No tonsillar exudate or tonsillar abscesses. 0 on the right. 0 on the left.     Comments: Audible nasal congestion, nasal sniffing bilateral allergic shiners; cobblestoning posterior pharynx Eyes:     General: Lids are normal. Vision grossly intact. Gaze aligned appropriately. Allergic shiner present. No  scleral icterus.       Right eye: No foreign body, discharge or hordeolum.        Left eye: No foreign body, discharge or hordeolum.     Extraocular Movements: Extraocular movements intact.     Right eye: Normal extraocular motion and no nystagmus.     Left eye: Normal extraocular motion and no nystagmus.     Conjunctiva/sclera: Conjunctivae normal.     Right eye: Right conjunctiva is not injected. No chemosis, exudate or hemorrhage.    Left eye: Left conjunctiva is not injected. No chemosis, exudate or hemorrhage.    Pupils: Pupils are equal, round, and reactive to light. Pupils are equal.     Right eye: Pupil is round and reactive.     Left eye: Pupil is round and reactive.  Neck:     Thyroid: No thyroid mass or thyromegaly.     Trachea: Trachea and phonation normal. No tracheal tenderness or tracheal deviation.  Cardiovascular:     Rate and Rhythm: Normal rate and regular rhythm.     Pulses: Normal pulses.          Radial pulses are 2+ on the right side and 2+ on the left side.     Heart sounds: Normal heart sounds, S1 normal and S2 normal.  Pulmonary:     Effort: Pulmonary effort is normal. No accessory muscle usage or respiratory distress.     Breath sounds: Normal air entry. No stridor, decreased air movement or transmitted upper airway sounds. Examination of the right-lower field reveals decreased breath sounds. Examination of the left-lower field reveals decreased breath sounds. Decreased breath sounds present. No wheezing, rhonchi or rales.     Comments: Clear coarse breath sounds in clinic with intermittent dry cough observed; decreased breath sounds bilateral bases Chest:     Chest wall: No tenderness.  Abdominal:     General: There is no distension.     Palpations: Abdomen is soft.  Musculoskeletal:        General: No tenderness. Normal range of motion.     Right hand: Normal strength. Normal capillary refill.     Left hand: Normal strength. Normal capillary refill.      Cervical back: Normal range of motion and neck supple. No swelling, edema, deformity, erythema, signs of trauma, lacerations, rigidity, spasms, torticollis, tenderness or crepitus. No pain with movement or muscular tenderness. Normal range of motion.     Thoracic back: No swelling, edema, deformity, signs of trauma, lacerations, spasms or tenderness. Normal range of motion.  Lymphadenopathy:     Head:     Right side of head: No submental, submandibular, tonsillar, preauricular, posterior auricular or occipital adenopathy.     Left side of head: No submental, submandibular, tonsillar, preauricular, posterior auricular or occipital adenopathy.     Cervical: No cervical adenopathy.     Right cervical: No superficial, deep or posterior cervical adenopathy.    Left cervical: No superficial, deep or posterior cervical adenopathy.  Skin:    General: Skin is warm and dry.     Capillary Refill: Capillary refill takes less than 2 seconds.     Coloration: Skin is not ashen, cyanotic, jaundiced, mottled, pale  or sallow.     Findings: No abrasion, abscess, acne, bruising, burn, ecchymosis, erythema, signs of injury, laceration, lesion, petechiae, rash or wound.     Nails: There is no clubbing.  Neurological:     General: No focal deficit present.     Mental Status: She is alert and oriented to person, place, and time. Mental status is at baseline. She is not disoriented.     GCS: GCS eye subscore is 4. GCS verbal subscore is 5. GCS motor subscore is 6.     Cranial Nerves: Cranial nerves 2-12 are intact. No cranial nerve deficit, dysarthria or facial asymmetry.     Sensory: Sensation is intact. No sensory deficit.     Motor: Motor function is intact. No weakness, tremor, atrophy, abnormal muscle tone or seizure activity.     Coordination: Coordination is intact. Coordination normal.     Gait: Gait is intact. Gait normal.     Comments: In/out of chair and on/off exam table without difficulty; gait sure and  steady; bilateral hand grasp equal 5/5  Psychiatric:        Attention and Perception: Attention and perception normal.        Mood and Affect: Mood and affect normal.        Speech: Speech normal.        Behavior: Behavior normal. Behavior is cooperative.        Thought Content: Thought content normal.        Cognition and Memory: Cognition and memory normal.        Judgment: Judgment normal.           Assessment & Plan:   A-acute rhinitis/bronchitis   P-discussed with patient many viruses circulating in the community.  Discussed below freezing temps causes vasomotor rhinitis and no medication will stop that drip.  Noted postnasal drip clear from above and cobblestoning mild  Illness/cold typically lasting 7-14 days clear to yellow to green mucous then should be done.  If opaque, tan, foul tasting, exudate in throat, enlarged lymph nodes, fever greater than 100.81F,  blood or wheezing/shortness of breath notify clinic staff/seek re-evaluation.  Given one free [place] govt home test to perform prior to returning to [employer].  Discussed wear mask if in close contact with other e.g. within 6 feet or immunocompromised.  Has albuterol inhaler at home for prn use 1-2 puffs po q4-6h.   Drink water to keep urine pale yellow clear and avoid dehydration.  Patient may use normal saline nasal spray 2 sprays each nostril q2h wa as needed. flonase 1 spray each nostril BID #16 RF6 electronic rx sent to her pharmacy of choice needed refill uses for year round allergies.  Patient denied personal or family history of ENT cancer.  Avoid triggers if possible.  Shower prior to bedtime if exposed to triggers.  If allergic dust/dust mites recommend mattress/pillow covers/encasements; washing linens, vacuuming, sweeping, dusting weekly.  Call or return to clinic as needed if these symptoms worsen or fail to improve as anticipated.   Exitcare handouts on viral illness and nonallergic rhinitis and sinus rinse.  Patient  verbalized understanding of instructions, agreed with plan of care and had no further questions at this time.  P2:  Avoidance and hand washing.   Cough lozenges po q2h prn cough per manufacturer instructions  Albuterol MDI 1-2 puffs po q4-6h prn protracted cough/wheeze possible side effects hand tremor/ increased heart rate. Bronchitis simple, community acquired, may have started as viral (probably respiratory  syncytial, parainfluenza, influenza, or adenovirus), but now evidence of acute purulent bronchitis with resultant bronchial edema and mucus formation.  Viruses are the most common cause of bronchial inflammation in otherwise healthy adults with acute bronchitis.  The appearance of sputum is not predictive of whether a bacterial infection is present.  Purulent sputum is most often caused by viral infections.  There are a small portion of those caused by non-viral agents being Mycoplama pneumonia.  Microscopic examination or C&S of sputum in the healthy adult with acute bronchitis is generally not helpful (usually negative or normal respiratory flora) other considerations being cough from upper respiratory tract infections, sinusitis or allergic syndromes (mild asthma or viral pneumonia).  Differential Diagnoses:  reactive airway disease (asthma, allergic aspergillosis (eosinophilia), chronic bronchitis, respiratory infection (sinusitis, common cold, pneumonia), congestive heart failure, reflux esophagitis, bronchogenic tumor, aspiration syndromes and/or exposure to pulmonary irritants/smoke.  Without high fever, severe dyspnea, lack of physical findings or other risk factors, I will hold on a chest radiograph and CBC at this time.  I discussed that approximately 50% of patients with acute bronchitis have a cough that lasts up to three weeks, and 25% for over a month.  Tylenol 500mg  one to two tablets every four to six hours as needed for fever or myalgias.  Exitcare handout on bronchitis and inhaler  use given to patient.  ER if hemopthysis, SOB, worst chest pain of life.   Patient instructed to follow up in one week or sooner if symptoms worsen.  Patient verbalized agreement and understanding of treatment plan.  P2:  hand washing and cover cough

## 2023-11-01 NOTE — Progress Notes (Signed)
Rapid covid 19 test results are negative.

## 2023-11-01 NOTE — Patient Instructions (Addendum)
Acute Bronchitis, Adult  Acute bronchitis is sudden inflammation of the main airways (bronchi) that come off the windpipe (trachea) in the lungs. The swelling causes the airways to get smaller and make more mucus than normal. This can make it hard to breathe and can cause coughing or noisy breathing (wheezing). Acute bronchitis may last several weeks. The cough may last longer. Allergies, asthma, and exposure to smoke may make the condition worse. What are the causes? This condition can be caused by germs and by substances that irritate the lungs, including: Cold and flu viruses. The most common cause of this condition is the virus that causes the common cold. Bacteria. This is less common. Breathing in substances that irritate the lungs, including: Smoke from cigarettes and other forms of tobacco. Dust and pollen. Fumes from household cleaning products, gases, or burned fuel. Indoor or outdoor air pollution. What increases the risk? The following factors may make you more likely to develop this condition: A weak body's defense system, also called the immune system. A condition that affects your lungs and breathing, such as asthma. What are the signs or symptoms? Common symptoms of this condition include: Coughing. This may bring up clear, yellow, or green mucus from your lungs (sputum). Wheezing. Runny or stuffy nose. Having too much mucus in your lungs (chest congestion). Shortness of breath. Aches and pains, including sore throat or chest. How is this diagnosed? This condition is usually diagnosed based on: Your symptoms and medical history. A physical exam. You may also have other tests, including tests to rule out other conditions, such as pneumonia. These tests include: A test of lung function. Test of a mucus sample to look for the presence of bacteria. Tests to check the oxygen level in your blood. Blood tests. Chest X-ray. How is this treated? Most cases of acute  bronchitis clear up over time without treatment. Your health care provider may recommend: Drinking more fluids to help thin your mucus so it is easier to cough up. Taking inhaled medicine (inhaler) to improve air flow in and out of your lungs. Using a vaporizer or a humidifier. These are machines that add water to the air to help you breathe better. Taking a medicine that thins mucus and clears congestion (expectorant). Taking a medicine that prevents or stops coughing (cough suppressant). It is not common to take an antibiotic medicine for this condition. Follow these instructions at home:  Take over-the-counter and prescription medicines only as told by your health care provider. Use an inhaler, vaporizer, or humidifier as told by your health care provider. Take two teaspoons (10 mL) of honey at bedtime to lessen coughing at night. Drink enough fluid to keep your urine pale yellow. Do not use any products that contain nicotine or tobacco. These products include cigarettes, chewing tobacco, and vaping devices, such as e-cigarettes. If you need help quitting, ask your health care provider. Get plenty of rest. Return to your normal activities as told by your health care provider. Ask your health care provider what activities are safe for you. Keep all follow-up visits. This is important. How is this prevented? To lower your risk of getting this condition again: Wash your hands often with soap and water for at least 20 seconds. If soap and water are not available, use hand sanitizer. Avoid contact with people who have cold symptoms. Try not to touch your mouth, nose, or eyes with your hands. Avoid breathing in smoke or chemical fumes. Breathing smoke or chemical fumes will make  your condition worse. Get the flu shot every year. Contact a health care provider if: Your symptoms do not improve after 2 weeks. You have trouble coughing up the mucus. Your cough keeps you awake at night. You have  a fever. Get help right away if you: Cough up blood. Feel pain in your chest. Have severe shortness of breath. Faint or keep feeling like you are going to faint. Have a severe headache. Have a fever or chills that get worse. These symptoms may represent a serious problem that is an emergency. Do not wait to see if the symptoms will go away. Get medical help right away. Call your local emergency services (911 in the [place] Do not drive yourself to the hospital. Summary Acute bronchitis is inflammation of the main airways (bronchi) that come off the windpipe (trachea) in the lungs. The swelling causes the airways to get smaller and make more mucus than normal. Drinking more fluids can help thin your mucus so it is easier to cough up. Take over-the-counter and prescription medicines only as told by your health care provider. Do not use any products that contain nicotine or tobacco. These products include cigarettes, chewing tobacco, and vaping devices, such as e-cigarettes. If you need help quitting, ask your health care provider. Contact a health care provider if your symptoms do not improve after 2 weeks. This information is not intended to replace advice given to you by your health care provider. Make sure you discuss any questions you have with your health care provider. Document Revised: 02/23/2022 Document Reviewed: 03/16/2021 Elsevier Patient Education  2024 [employer] Viral Respiratory Infection A respiratory infection is an illness that affects part of the respiratory system, such as the lungs, nose, or throat. A respiratory infection that is caused by a virus is called a viral respiratory infection. Common types of viral respiratory infections include: A cold. The flu (influenza). A respiratory syncytial virus (RSV) infection. What are the causes? This condition is caused by a virus. The virus may spread through contact with droplets or direct contact with infected people or their  mucus or secretions. The virus may spread from person to person (is contagious). What are the signs or symptoms? Symptoms of this condition include: A stuffy or runny nose. A sore throat or cough. Shortness of breath or difficulty breathing. Yellow or green mucus (sputum). Other symptoms may include: A fever. Sweating or chills. Fatigue. Achy muscles. A headache. How is this diagnosed? This condition may be diagnosed based on: Your symptoms. A physical exam. Testing of secretions from the nose or throat. Chest X-ray. How is this treated? This condition may be treated with medicines, such as: Antiviral medicine. This may shorten the length of time a person has symptoms. Expectorants. These make it easier to cough up mucus. Decongestant nasal sprays. Acetaminophen or NSAIDs, such as ibuprofen, to relieve fever and pain. Antibiotic medicines are not prescribed for viral infections.This is because antibiotics are designed to kill bacteria. They do not kill viruses. Follow these instructions at home: Managing pain and congestion Take over-the-counter and prescription medicines only as told by your health care provider. If you have a sore throat, gargle with a mixture of salt and water 3-4 times a day or as needed. To make salt water, completely dissolve -1 tsp (3-6 g) of salt in 1 cup (237 mL) of warm water. Use nose drops made from salt water to ease congestion and soften raw skin around your nose. Take 2 tsp (10 mL) of  honey at bedtime to lessen coughing at night. Do not give honey to children who are younger than 1 year. Drink enough fluid to keep your urine pale yellow. This helps prevent dehydration and helps loosen up mucus. General instructions  Rest as much as possible. Do not drink alcohol. Do not use any products that contain nicotine or tobacco. These products include cigarettes, chewing tobacco, and vaping devices, such as e-cigarettes. If you need help quitting, ask  your health care provider. Keep all follow-up visits. This is important. How is this prevented?     Get an annual flu shot. You may get the flu shot in late summer, fall, or winter. Ask your health care provider when you should get your flu shot. Avoid spreading your infection to other people. If you are sick: Wash your hands with soap and water often, especially after you cough or sneeze. Wash for at least 20 seconds. If soap and water are not available, use alcohol-based hand sanitizer. Cover your mouth when you cough. Cover your nose and mouth when you sneeze. Do not share cups or eating utensils. Clean commonly used objects often. Clean commonly touched surfaces. Stay home from work or school as told by your health care provider. Avoid contact with people who are sick during cold and flu season. This is generally fall and winter. Contact a health care provider if: Your symptoms last for 10 days or longer. Your symptoms get worse over time. You have severe sinus pain in your face or forehead. The glands in your jaw or neck become very swollen. You have shortness of breath. Get help right away if you: Feel pain or pressure in your chest. Have trouble breathing. Faint or feel like you will faint. Have severe and persistent vomiting. Feel confused or disoriented. These symptoms may represent a serious problem that is an emergency. Do not wait to see if the symptoms will go away. Get medical help right away. Call your local emergency services (911 in the [place] Do not drive yourself to the hospital. Summary A respiratory infection is an illness that affects part of the respiratory system, such as the lungs, nose, or throat. A respiratory infection that is caused by a virus is called a viral respiratory infection. Common types of viral respiratory infections include a cold, influenza, and respiratory syncytial virus (RSV) infection. Symptoms of this condition include a stuffy or runny  nose, cough, fatigue, achy muscles, sore throat, and fevers or chills. Antibiotic medicines are not prescribed for viral infections. This is because antibiotics are designed to kill bacteria. They are not effective against viruses. This information is not intended to replace advice given to you by your health care provider. Make sure you discuss any questions you have with your health care provider. Document Revised: 02/17/2021 Document Reviewed: 02/17/2021 Elsevier Patient Education  2024 [employer] Nonallergic Rhinitis Nonallergic rhinitis is inflammation of the mucous membrane inside the nose. The mucous membrane is the tissue that produces mucus. This condition is different from having allergic rhinitis, which is an allergy that affects the nose. Allergic rhinitis occurs when the body's defense system, or immune system, reacts to a substance that a person is allergic to (allergen), such as pollen, pet dander, mold, or dust. Nonallergic rhinitis has many similar symptoms, but it is not caused by allergens. Nonallergic rhinitis can be an acute or chronic problem. This means it can be short-term or long-term. What are the causes? This condition may be caused by many different things. Some  common types of nonallergic rhinitis include: Infectious rhinitis. This is usually caused by an infection in the nose, throat, or upper airways (upper respiratory system). Vasomotor rhinitis. This is the most common type. It is caused by too much blood flow through your nose, and makes your nose swell. It is triggered by strong odors, cold air, stress, drinking alcohol, cigarette smoke, or changes in the weather. Occupational rhinitis. This type is caused by triggers in the workplace, such as chemicals, dust, animal dander, or air pollution. Hormonal rhinitis, in female teens and adults. This type is caused by an increase in the hormone estrogen and may happen during pregnancy, puberty, or monthly menstrual  periods. Hormonal rhinitis gives you fewer symptoms when estrogen levels drop. Drug-induced rhinitis. Several types of medicines can cause this, such as medicines for high blood pressure or heart disease, aspirin, or NSAIDs. Nonallergic rhinitis with eosinophilia syndrome (NARES). This type is caused by having too much eosinophil, a type of white blood cell. Other causes include a reaction to eating hot or spicy foods. This does not usually cause long-term symptoms. In some cases, the cause of nonallergic rhinitis is not known. What increases the risk? You are more likely to develop this condition if: You are 91-12 years of age. You are female. People who are female are twice as likely to have this condition. What are the signs or symptoms? Common symptoms of this condition include: Stuffy nose (nasal congestion). Runny nose. A feeling of mucus dripping down the back of your throat (postnasal drip). Trouble sleeping. Tiredness, or fatigue. Other symptoms include: Sneezing. Coughing. Itchy nose. Bloodshot eyes. How is this diagnosed? This type may be diagnosed based on: Your symptoms and medical history. A physical exam. Allergy testing to rule out allergic rhinitis. You may have skin tests or blood tests. Your health care provider may also take a swab of nasal discharge to look for an increased number of eosinophils. This is done to confirm a diagnosis of NARES. How is this treated? Treatment for this condition depends on the cause. No single treatment works for everyone. Work with your provider to find the best treatment for you. Treatment may include: Avoiding the things that trigger your symptoms. Medicines to relieve congestion, such as: Steroid nasal spray. There are many types. You may need to try a few to find out which one works best. Engineer, civil (consulting) medicine. This treats nasal congestion and may be given by mouth or as a nasal spray. These medicines are used only for a short  time. Medicines to relieve a runny nose. These may include antihistamine medicines or decongestant nasal sprays. Nasal irrigation. This involves using a salt-water (saline) spray or saline container called a neti pot. Nasal irrigation helps to clear away mucus and keep your nasal passages moist. Surgery to remove part of your mucous membrane. This is done in severe cases if the condition has not improved after 6-12 months of treatment. Follow these instructions at home: Medicines Take or use over-the-counter and prescription medicines only as told by your provider. Do not stop using your medicine even if you start to feel better. Do not take NSAIDs, such as ibuprofen, or medicines that contain aspirin if they make your symptoms worse. Lifestyle Do not drink alcohol if it makes your symptoms worse. Do not use any products that contain nicotine or tobacco. These products include cigarettes, chewing tobacco, and vaping devices, such as e-cigarettes. If you need help quitting, ask your provider. Avoid secondhand smoke. General instructions Avoid triggers  that make your symptoms worse. Use nasal irrigation as told by your provider. Sleep with the head of your bed raised. This may reduce nasal congestion when you sleep. Drink enough fluid to keep your pee (urine) pale yellow. Contact a health care provider if: You have a fever. Your symptoms are getting worse at home. Your symptoms do not lessen with medicine. You develop new symptoms, especially a headache or nosebleed. Get help right away if: You have difficulty breathing. This symptom may be an emergency. Get help right away. Call 911. Do not wait to see if the symptoms will go away. Do not drive yourself to the hospital. This information is not intended to replace advice given to you by your health care provider. Make sure you discuss any questions you have with your health care provider. Document Revised: 07/18/2022 Document Reviewed:  07/18/2022 Elsevier Patient Education  2024 [employer] How to Perform a Sinus Rinse A sinus rinse is a home treatment that is used to rinse your sinuses with a germ-free (sterile) mixture of salt and water (saline solution). Sinuses are air-filled spaces in your skull that are behind the bones of your face and forehead. They open into your nasal cavity. A sinus rinse can help to clear mucus, dirt, dust, or pollen from your nasal cavity. You may do a sinus rinse when you have a cold, a virus, nasal allergy symptoms, a sinus infection, or stuffiness in your nose or sinuses. What are the risks? A sinus rinse is generally safe and effective. However, there are a few risks, which include: A burning sensation in your sinuses. This may happen if you do not make the saline solution as directed. Be sure to follow all directions when making the saline solution. Nasal irritation. Infection. This may be from unclean supplies or from contaminated water. Infection from contaminated water is rare, but possible. Do not do a sinus rinse if you have had ear or nasal surgery, ear infection, or plugged ears, unless recommended by your health care provider. Supplies needed: Saline solution or powder. Distilled or sterile water to mix with saline powder. You may use boiled and cooled tap water. Boil tap water for 5 minutes; cool until it is lukewarm. Use within 24 hours. Do not use regular tap water to mix with the saline solution. Neti pot or nasal rinse bottle. These supplies release the saline solution into your nose and through your sinuses. Neti pots and nasal rinse bottles can be purchased at [employer], a health food store, or online. How to perform a sinus rinse  Wash your hands with soap and water for at least 20 seconds. If soap and water are not available, use hand sanitizer. Wash your device according to the directions that came with the product and then dry it. Use the solution that comes  with your product or one that is sold separately in stores. Follow the mixing directions on the package to mix with sterile or distilled water. Fill the device with the amount of saline solution noted in the device instructions. Stand by a sink and tilt your head sideways over the sink. Place the spout of the device in your upper nostril (the one closer to the ceiling). Gently pour or squeeze the saline solution into your nasal cavity. The liquid should drain out from the lower nostril if you are not too congested. While rinsing, breathe through your open mouth. Gently blow your nose to clear any mucus and rinse solution. Blowing too hard  may cause ear pain. Turn your head in the other direction and repeat in your other nostril. Clean and rinse your device with clean water and then air-dry it. Talk with your health care provider or pharmacist if you have questions about how to do a sinus rinse. Summary A sinus rinse is a home treatment that is used to rinse your sinuses with a sterile mixture of salt and water (saline solution). You may do a sinus rinse when you have a cold, a virus, nasal allergy symptoms, a sinus infection, or stuffiness in your nose or sinuses. A sinus rinse is generally safe and effective. Follow all instructions carefully. This information is not intended to replace advice given to you by your health care provider. Make sure you discuss any questions you have with your health care provider. Document Revised: 05/02/2021 Document Reviewed: 05/02/2021 Elsevier Patient Education  2024 [employer].

## 2023-11-01 NOTE — Progress Notes (Signed)
Covid 19 rapid done using free clinic test at 1110am. Will update with results.

## 2023-11-02 ENCOUNTER — Telehealth: Payer: Self-pay | Admitting: Orthopedic Surgery

## 2023-11-02 MED ORDER — AMOXICILLIN 500 MG PO TABS: ORAL_TABLET | ORAL | 0 refills | Status: DC

## 2023-11-02 NOTE — Telephone Encounter (Signed)
Pt having a dental procedure Tuesday and needs antibiotics sent to [facility] on [address] please advise

## 2023-11-02 NOTE — Telephone Encounter (Signed)
Sent to pharmacy. I called patient and advised. 

## 2023-11-08 ENCOUNTER — Encounter: Payer: Self-pay | Admitting: Registered Nurse

## 2023-11-08 ENCOUNTER — Other Ambulatory Visit: Payer: Self-pay

## 2023-11-08 MED ORDER — ALBUTEROL SULFATE HFA 108 (90 BASE) MCG/ACT IN AERS
1.0000 | INHALATION_SPRAY | RESPIRATORY_TRACT | 1 refills | Status: AC | PRN
  Filled 2023-11-08: qty 6.7, 17d supply, fill #0

## 2023-11-08 NOTE — Telephone Encounter (Signed)
Patient contacted clinic stated no albuterol inhaler at home would like rx sent to [facility] electronic Rx sent albuterol 144mcg/act 1-2 puffs po q4-6h prn protracted cough/chest tightness/wheezing/shortness of breath #1 RF1

## 2023-11-15 ENCOUNTER — Ambulatory Visit: Payer: No Typology Code available for payment source | Admitting: Registered Nurse

## 2023-11-15 ENCOUNTER — Encounter: Payer: Self-pay | Admitting: Registered Nurse

## 2023-11-15 VITALS — BP 130/68 | HR 87 | Temp 98.4°F

## 2023-11-15 DIAGNOSIS — J209 Acute bronchitis, unspecified: Secondary | ICD-10-CM

## 2023-11-15 DIAGNOSIS — H6692 Otitis media, unspecified, left ear: Secondary | ICD-10-CM

## 2023-11-15 DIAGNOSIS — J329 Chronic sinusitis, unspecified: Secondary | ICD-10-CM

## 2023-11-15 MED ORDER — SALINE SPRAY 0.65 % NA SOLN: 2.0000 | NASAL | Status: DC

## 2023-11-15 MED ORDER — AMOXICILLIN 875 MG PO TABS: 875.0000 mg | ORAL_TABLET | Freq: Two times a day (BID) | ORAL | Status: AC

## 2023-11-15 NOTE — Patient Instructions (Signed)
Sinus Pain  Sinus pain may occur when your sinuses become clogged or swollen. Sinuses are air-filled spaces in your skull that are behind the bones of your face and forehead. Sinus pain can range from mild to severe. What are the causes? Sinus pain can result from various conditions that affect the sinuses. Common causes include: Colds. Sinus infections. Allergies. What are the signs or symptoms? The main symptom of this condition is pain or pressure in your face, forehead, ears, or upper teeth. People who have sinus pain often have other symptoms, such as: Congested or runny nose. Fever. Inability to smell. Headache. Weather changes can make symptoms worse. How is this diagnosed? Your health care provider will diagnose this condition based on your symptoms and a physical exam. If you have pain that keeps coming back or does not go away, your health care provider may recommend more testing. This may include: Imaging tests, such as a CT scan or MRI, to check for problems with your sinuses. Examination of your sinuses using a thin tool with a camera that is inserted through your nose (endoscopy). How is this treated? Treatment for this condition depends on the cause. Sinus pain that is caused by a sinus infection may be treated with antibiotic medicine. Sinus pain that is caused by congestion may be helped by rinsing out (flushing) the nose and sinuses with saline solution. Sinus pain that is caused by allergies may be helped by allergy medicines (antihistamines) and medicated nasal sprays. Sinus surgery may be needed in some cases if other treatments do not help. Follow these instructions at home: General instructions If directed: Apply a warm, moist washcloth to your face to help relieve pain. Use a nasal saline wash. Follow the directions on the bottle or box. Hydrate and humidify Drink enough water to keep your urine clear or pale yellow. Staying hydrated will help to thin your  mucus. Use a humidifier if your home is dry. Inhale steam for 10-15 minutes, 3-4 times a day or as told by your health care provider. You can do this in the bathroom while a hot shower is running. Limit your exposure to cool or dry air. Medicines  Take over-the-counter and prescription medicines only as told by your health care provider. If you were prescribed an antibiotic medicine, take it as told by your health care provider. Do not stop taking the antibiotic even if you start to feel better. If you have congestion, use a nasal spray to help lessen pressure. Contact a health care provider if: You have sinus pain more than one time a week. You have sensitivity to light or sound. You develop a fever. You feel nauseous or you vomit. Your sinus pain or headache does not get better with treatment. Get help right away if: You have vision problems. You have sudden, severe pain in your face or head. You have a seizure. You are confused. You have a stiff neck. Summary Sinus pain occurs when your sinuses become clogged or swollen. Sinus pain can result from various conditions that affect the sinuses, such as a cold, a sinus infection, or an allergy. Treatment for this condition depends on the cause. It may include medicine, such as antibiotics or antihistamines. This information is not intended to replace advice given to you by your health care provider. Make sure you discuss any questions you have with your health care provider. Document Revised: 10/16/2021 Document Reviewed: 10/16/2021 Else[employer] Patient Education  2024 [employer] Acute Bronchitis, Adult  Acute bronchitis  is sudden inflammation of the main airways (bronchi) that come off the windpipe (trachea) in the lungs. The swelling causes the airways to get smaller and make more mucus than normal. This can make it hard to breathe and can cause coughing or noisy breathing (wheezing). Acute bronchitis may last several weeks. The cough  may last longer. Allergies, asthma, and exposure to smoke may make the condition worse. What are the causes? This condition can be caused by germs and by substances that irritate the lungs, including: Cold and flu viruses. The most common cause of this condition is the virus that causes the common cold. Bacteria. This is less common. Breathing in substances that irritate the lungs, including: Smoke from cigarettes and other forms of tobacco. Dust and pollen. Fumes from household cleaning products, gases, or burned fuel. Indoor or outdoor air pollution. What increases the risk? The following factors may make you more likely to develop this condition: A weak body's defense system, also called the immune system. A condition that affects your lungs and breathing, such as asthma. What are the signs or symptoms? Common symptoms of this condition include: Coughing. This may bring up clear, yellow, or green mucus from your lungs (sputum). Wheezing. Runny or stuffy nose. Having too much mucus in your lungs (chest congestion). Shortness of breath. Aches and pains, including sore throat or chest. How is this diagnosed? This condition is usually diagnosed based on: Your symptoms and medical history. A physical exam. You may also have other tests, including tests to rule out other conditions, such as pneumonia. These tests include: A test of lung function. Test of a mucus sample to look for the presence of bacteria. Tests to check the oxygen level in your blood. Blood tests. Chest X-ray. How is this treated? Most cases of acute bronchitis clear up over time without treatment. Your health care provider may recommend: Drinking more fluids to help thin your mucus so it is easier to cough up. Taking inhaled medicine (inhaler) to improve air flow in and out of your lungs. Using a vaporizer or a humidifier. These are machines that add water to the air to help you breathe better. Taking a medicine  that thins mucus and clears congestion (expectorant). Taking a medicine that prevents or stops coughing (cough suppressant). It is not common to take an antibiotic medicine for this condition. Follow these instructions at home:  Take over-the-counter and prescription medicines only as told by your health care provider. Use an inhaler, vaporizer, or humidifier as told by your health care provider. Take two teaspoons (10 mL) of honey at bedtime to lessen coughing at night. Drink enough fluid to keep your urine pale yellow. Do not use any products that contain nicotine or tobacco. These products include cigarettes, chewing tobacco, and vaping devices, such as e-cigarettes. If you need help quitting, ask your health care provider. Get plenty of rest. Return to your normal activities as told by your health care provider. Ask your health care provider what activities are safe for you. Keep all follow-up visits. This is important. How is this prevented? To lower your risk of getting this condition again: Wash your hands often with soap and water for at least 20 seconds. If soap and water are not available, use hand sanitizer. Avoid contact with people who have cold symptoms. Try not to touch your mouth, nose, or eyes with your hands. Avoid breathing in smoke or chemical fumes. Breathing smoke or chemical fumes will make your condition worse. Get the  flu shot every year. Contact a health care provider if: Your symptoms do not improve after 2 weeks. You have trouble coughing up the mucus. Your cough keeps you awake at night. You have a fever. Get help right away if you: Cough up blood. Feel pain in your chest. Have severe shortness of breath. Faint or keep feeling like you are going to faint. Have a severe headache. Have a fever or chills that get worse. These symptoms may represent a serious problem that is an emergency. Do not wait to see if the symptoms will go away. Get medical help right  away. Call your local emergency services (911 in the [place] Do not drive yourself to the hospital. Summary Acute bronchitis is inflammation of the main airways (bronchi) that come off the windpipe (trachea) in the lungs. The swelling causes the airways to get smaller and make more mucus than normal. Drinking more fluids can help thin your mucus so it is easier to cough up. Take over-the-counter and prescription medicines only as told by your health care provider. Do not use any products that contain nicotine or tobacco. These products include cigarettes, chewing tobacco, and vaping devices, such as e-cigarettes. If you need help quitting, ask your health care provider. Contact a health care provider if your symptoms do not improve after 2 weeks. This information is not intended to replace advice given to you by your health care provider. Make sure you discuss any questions you have with your health care provider. Document Revised: 02/23/2022 Document Reviewed: 03/16/2021 Else[employer] Patient Education  2024 [employer] Otitis Media, Adult  Otitis media occurs when there is inflammation and fluid in the middle ear with signs and symptoms of an acute infection. The middle ear is a part of the ear that contains bones for hearing as well as air that helps send sounds to the brain. When infected fluid builds up in this space, it causes pressure and can lead to an ear infection. The eustachian tube connects the middle ear to the back of the nose (nasopharynx) and normally allows air into the middle ear. If the eustachian tube becomes blocked, fluid can build up and become infected. What are the causes? This condition is caused by a blockage in the eustachian tube. This can be caused by mucus or by swelling of the tube. Problems that can cause a blockage include: A cold or other upper respiratory infection. Allergies. An irritant, such as tobacco smoke. Enlarged adenoids. The adenoids are areas of soft  tissue located high in the back of the throat, behind the nose and the roof of the mouth. They are part of the body's defense system (immune system). A mass in the nasopharynx. Damage to the ear caused by pressure changes (barotrauma). What increases the risk? You are more likely to develop this condition if you: Smoke or are exposed to tobacco smoke. Have an opening in the roof of your mouth (cleft palate). Have gastroesophageal reflux. Have an immune system disorder. What are the signs or symptoms? Symptoms of this condition include: Ear pain. Fever. Decreased hearing. Tiredness (lethargy). Fluid leaking from the ear, if the eardrum is ruptured or has burst. Ringing in the ear. How is this diagnosed?  This condition is diagnosed with a physical exam. During the exam, your health care provider will use an instrument called an otoscope to look in your ear and check for redness, swelling, and fluid. He or she will also ask about your symptoms. Your health care provider may  also order tests, such as: A pneumatic otoscopy. This is a test to check the movement of the eardrum. It is done by squeezing a small amount of air into the ear. A tympanogram. This is a test that shows how well the eardrum moves in response to air pressure in the ear canal. It provides a graph for your health care provider to review. How is this treated? This condition can go away on its own within 3-5 days. But if the condition is caused by a bacterial infection and does not go away on its own, or if it keeps coming back, your health care provider may: Prescribe antibiotic medicine to treat the infection. Prescribe or recommend medicines to control pain. Follow these instructions at home: Take over-the-counter and prescription medicines only as told by your health care provider. If you were prescribed an antibiotic medicine, take it as told by your health care provider. Do not stop taking the antibiotic even if you  start to feel better. Keep all follow-up visits. This is important. Contact a health care provider if: You have bleeding from your nose. There is a lump on your neck. You are not feeling better in 5 days. You feel worse instead of better. Get help right away if: You have severe pain that is not controlled with medicine. You have swelling, redness, or pain around your ear. You have stiffness in your neck. A part of your face is not moving (paralyzed). The bone behind your ear (mastoid bone) is tender when you touch it. You develop a severe headache. Summary Otitis media is redness, soreness, and swelling of the middle ear, usually resulting in pain and decreased hearing. This condition can go away on its own within 3-5 days. If the problem does not go away in 3-5 days, your health care provider may give you medicines to treat the infection. If you were prescribed an antibiotic medicine, take it as told by your health care provider. Follow all instructions that were given to you by your health care provider. This information is not intended to replace advice given to you by your health care provider. Make sure you discuss any questions you have with your health care provider. Document Revised: 02/21/2021 Document Reviewed: 02/21/2021 Else[employer] Patient Education  2024 [employer].

## 2023-11-15 NOTE — Progress Notes (Signed)
Subjective:    Patient ID: Stephanie Chandler, female    DOB: 31-Mar-1966, 57 y.o.   MRN: [id number]  57y/o african [place] female established patient here for re-evaluation cough/sinus pressure and new left ear pain.  Denied discharge/fever.  Using albuterol inhaler twice a day am and pm but getting chest tightness midday.    Review of Systems  Constitutional:  Positive for fatigue. Negative for chills and fever.  HENT:  Positive for congestion, ear pain, postnasal drip, rhinorrhea, sinus pressure, sinus pain and sore throat. Negative for ear discharge, facial swelling, hearing loss, mouth sores, nosebleeds, tinnitus, trouble swallowing and voice change.   Eyes:  Negative for photophobia, pain, discharge, redness, itching and visual disturbance.  Respiratory:  Positive for chest tightness. Negative for cough, choking, shortness of breath, wheezing and stridor.   Cardiovascular:  Negative for chest pain and palpitations.  Gastrointestinal:  Negative for constipation, diarrhea and rectal pain.  Genitourinary:  Negative for difficulty urinating.  Musculoskeletal:  Negative for gait problem, myalgias, neck pain and neck stiffness.  Skin:  Negative for rash.  Allergic/Immunologic: Positive for environmental allergies.  Neurological:  Negative for dizziness, tremors, syncope, facial asymmetry, speech difficulty, weakness, light-headedness and headaches.  Hematological:  Negative for adenopathy. Does not bruise/bleed easily.  Psychiatric/Behavioral:  Negative for agitation, confusion and sleep disturbance.        Objective:   Physical Exam Vitals and nursing note reviewed.  Constitutional:      General: She is awake. She is not in acute distress.    Appearance: Normal appearance. She is well-developed, well-groomed and overweight. She is not ill-appearing, toxic-appearing or diaphoretic.  HENT:     Head: Normocephalic and atraumatic.     Jaw: There is normal jaw occlusion. No trismus.      Salivary Glands: Right salivary gland is not diffusely enlarged or tender. Left salivary gland is not diffusely enlarged or tender.     Right Ear: Hearing, ear canal and external ear normal. No decreased hearing noted. No laceration, drainage, swelling or tenderness. A middle ear effusion is present. There is no impacted cerumen. No foreign body. No mastoid tenderness. No PE tube. No hemotympanum. Tympanic membrane is not injected, scarred, perforated, erythematous, retracted or bulging.     Left Ear: Hearing, ear canal and external ear normal. No decreased hearing noted. Swelling and tenderness present. No laceration or drainage. A middle ear effusion is present. There is no impacted cerumen. No foreign body. No mastoid tenderness. No PE tube. No hemotympanum. Tympanic membrane is injected, erythematous and bulging. Tympanic membrane is not scarred, perforated or retracted.     Ears:     Comments: Bilateral TMs intact; left bulging with injection/erythema/tenderness; no debris noted in auditory canals; air fluid levels clear bilaterally    Nose: Mucosal edema, congestion and rhinorrhea present. No nasal deformity, septal deviation or laceration. Rhinorrhea is clear.     Right Nostril: No epistaxis.     Left Nostril: No epistaxis.     Right Turbinates: Enlarged and swollen. Not pale.     Left Turbinates: Enlarged and swollen. Not pale.     Right Sinus: Maxillary sinus tenderness and frontal sinus tenderness present.     Left Sinus: Maxillary sinus tenderness and frontal sinus tenderness present.     Comments: Exquisitely TTP bilateral maxillary and frontal sinuses; nasal turbinates edema/erythema clear yellow discharge    Mouth/Throat:     Lips: Pink. No lesions.     Mouth: Mucous membranes are moist. Mucous  membranes are not pale, not dry and not cyanotic. No lacerations, oral lesions or angioedema.     Dentition: No dental abscesses or gum lesions.     Tongue: No lesions. Tongue does not deviate  from midline.     Palate: No mass and lesions.     Pharynx: Uvula midline. Pharyngeal swelling, posterior oropharyngeal erythema and postnasal drip present. No oropharyngeal exudate or uvula swelling.     Tonsils: No tonsillar exudate or tonsillar abscesses.     Comments: Oropharynx mild erythema; cobblestoning posterior pharynx; bilateral allergic shiners Eyes:     General: Lids are normal. Vision grossly intact. Gaze aligned appropriately. Allergic shiner present. No scleral icterus.       Right eye: No foreign body, discharge or hordeolum.        Left eye: No foreign body, discharge or hordeolum.     Extraocular Movements: Extraocular movements intact.     Right eye: Normal extraocular motion and no nystagmus.     Left eye: Normal extraocular motion and no nystagmus.     Conjunctiva/sclera: Conjunctivae normal.     Right eye: Right conjunctiva is not injected. No chemosis, exudate or hemorrhage.    Left eye: Left conjunctiva is not injected. No chemosis, exudate or hemorrhage.    Pupils: Pupils are equal, round, and reactive to light. Pupils are equal.     Right eye: Pupil is round and reactive.     Left eye: Pupil is round and reactive.  Neck:     Thyroid: No thyroid mass or thyromegaly.     Trachea: Trachea and phonation normal. No tracheal tenderness or tracheal deviation.  Cardiovascular:     Rate and Rhythm: Normal rate and regular rhythm.     Pulses: Normal pulses.          Radial pulses are 2+ on the right side and 2+ on the left side.     Heart sounds: Normal heart sounds, S1 normal and S2 normal.  Pulmonary:     Effort: Pulmonary effort is normal. No accessory muscle usage or respiratory distress.     Breath sounds: No stridor. Examination of the left-middle field reveals decreased breath sounds. Examination of the right-lower field reveals decreased breath sounds. Examination of the left-lower field reveals decreased breath sounds. Decreased breath sounds present. No  wheezing, rhonchi or rales.     Comments: Spoke full sentences without difficulty sp02 99-100% RA talking in exam room and sitting stable; respirations even and unlabored no cough observed in exam room Chest:     Chest wall: No tenderness.  Abdominal:     General: There is no distension.     Palpations: Abdomen is soft.  Musculoskeletal:        General: No tenderness. Normal range of motion.     Right hand: Normal strength. Normal capillary refill.     Left hand: Normal strength. Normal capillary refill.     Cervical back: Normal range of motion and neck supple. No swelling, edema, deformity, erythema, signs of trauma, lacerations, rigidity, spasms, torticollis, tenderness or crepitus. No pain with movement or muscular tenderness. Normal range of motion.     Thoracic back: No swelling, edema, deformity, signs of trauma, lacerations, spasms or tenderness. Normal range of motion.     Right hip: Normal.     Left hip: Normal.     Right knee: Normal.     Left knee: Normal.     Right lower leg: No edema.     Left  lower leg: No edema.  Lymphadenopathy:     Head:     Right side of head: No submental, submandibular, tonsillar, preauricular, posterior auricular or occipital adenopathy.     Left side of head: No submental, submandibular, tonsillar, preauricular, posterior auricular or occipital adenopathy.     Cervical: No cervical adenopathy.     Right cervical: No superficial, deep or posterior cervical adenopathy.    Left cervical: No superficial, deep or posterior cervical adenopathy.  Skin:    General: Skin is warm and dry.     Capillary Refill: Capillary refill takes less than 2 seconds.     Coloration: Skin is not ashen, cyanotic, jaundiced, mottled, pale or sallow.     Findings: No abrasion, abscess, acne, bruising, burn, ecchymosis, erythema, signs of injury, laceration, lesion, petechiae, rash or wound.     Nails: There is no clubbing.  Neurological:     General: No focal deficit  present.     Mental Status: She is alert and oriented to person, place, and time. Mental status is at baseline. She is not disoriented.     GCS: GCS eye subscore is 4. GCS verbal subscore is 5. GCS motor subscore is 6.     Cranial Nerves: Cranial nerves 2-12 are intact. No cranial nerve deficit, dysarthria or facial asymmetry.     Sensory: Sensation is intact. No sensory deficit.     Motor: Motor function is intact. No weakness, tremor, atrophy, abnormal muscle tone or seizure activity.     Coordination: Coordination is intact. Coordination normal.     Gait: Gait is intact. Gait normal.     Comments: In/out of chair and on/off exam table without difficulty; gait sure and steady in clinic; bilateral hand grasp equal 5/5  Psychiatric:        Attention and Perception: Attention and perception normal.        Mood and Affect: Mood and affect normal.        Speech: Speech normal.        Behavior: Behavior normal. Behavior is cooperative.        Thought Content: Thought content normal.        Cognition and Memory: Cognition and memory normal.        Judgment: Judgment normal.       Left TM erythema air fluid level clear; erythema oropharynx and cobblestoning posterior pharyx; bilateral allergic shiners; nasal congestion.  BBS diminished bases and LML.  Maxillary and frontal sinuses TTP    Assessment & Plan:   A-acute left otitis media, acute rhinosinusitis and acute bronchitis  P-Supportive treatment. Amoxicillin 875mg  po BID x 10 days #20 RF0 dispensed from [facility] to patient  Tylenol 1000mg  po QID prn pain/fever.   No evidence of invasive bacterial infection, non toxic and well hydrated.  This is most likely self limiting viral infection.  I do not see where any further testing or imaging is necessary at this time.   I will suggest supportive care, rest, good hygiene and encourage the patient to take adequate fluids.  The patient is to return to clinic or EMERGENCY ROOM if symptoms worsen or  change significantly e.g. ear pain, fever, purulent discharge from ears or bleeding.  Exitcare handout on otitis media   Patient verbalized agreement and understanding of treatment plan.     discussed with patient many viruses circulating in the community.  Discussed below freezing temps causes vasomotor rhinitis and no medication will stop that drip.  Noted postnasal drip clear  from above and cobblestoning mild  Illness/cold typically lasting 7-14 days clear to yellow to green mucous then should be done.  If opaque, tan, foul tasting, exudate in throat, enlarged lymph nodes, fever greater than 100.62F,  blood or wheezing/shortness of breath notify clinic staff/seek re-evaluation.  Given one free [place] govt home test to perform prior to returning to [employer].  Discussed wear mask if in close contact with other e.g. within 6 feet or immunocompromised.  Has albuterol inhaler at home for prn use 1-2 puffs po q4-6h.   Drink water to keep urine pale yellow clear and avoid dehydration.  Patient may use normal saline nasal spray 2 sprays each nostril q2h wa as needed. flonase 1 spray each nostril BID Patient denied personal or family history of ENT cancer.  Avoid triggers if possible.  Shower prior to bedtime if exposed to triggers.  If allergic dust/dust mites recommend mattress/pillow covers/encasements; washing linens, vacuuming, sweeping, dusting weekly.  Call or return to clinic as needed if these symptoms worsen or fail to improve as anticipated.   Patient verbalized understanding of instructions, agreed with plan of care and had no further questions at this time.  P2:  Avoidance and hand washing.    Cough lozenges po q2h prn cough per manufacturer instructions  Albuterol MDI 1-2 puffs po q4-6h prn protracted cough/wheeze possible side effects hand tremor/ increased heart rate. Discussed may increase use albuterol from BID to 2p po q4h prn chest tightness.   Bronchitis simple, community acquired, may  have started as viral (probably respiratory syncytial, parainfluenza, influenza, or adenovirus), but now evidence of acute purulent bronchitis with resultant bronchial edema and mucus formation.  Viruses are the most common cause of bronchial inflammation in otherwise healthy adults with acute bronchitis.  The appearance of sputum is not predictive of whether a bacterial infection is present.  Purulent sputum is most often caused by viral infections.  There are a small portion of those caused by non-viral agents being Mycoplama pneumonia.  Microscopic examination or C&S of sputum in the healthy adult with acute bronchitis is generally not helpful (usually negative or normal respiratory flora) other considerations being cough from upper respiratory tract infections, sinusitis or allergic syndromes (mild asthma or viral pneumonia).  Differential Diagnoses:  reactive airway disease (asthma, allergic aspergillosis (eosinophilia), chronic bronchitis, respiratory infection (sinusitis, common cold, pneumonia), congestive heart failure, reflux esophagitis, bronchogenic tumor, aspiration syndromes and/or exposure to pulmonary irritants/smoke.  Without high fever, severe dyspnea, lack of physical findings or other risk factors, I will hold on a chest radiograph and CBC at this time.  I discussed that approximately 50% of patients with acute bronchitis have a cough that lasts up to three weeks, and 25% for over a month.  Tylenol 500mg  one to two tablets every four to six hours as needed for fever or myalgias. ER if hemopthysis, SOB, worst chest pain of life.   Patient instructed to follow up in one week or sooner if symptoms worsen.  Patient verbalized agreement and understanding of treatment plan.  P2:  hand washing and cover cough

## 2023-11-30 ENCOUNTER — Other Ambulatory Visit: Payer: Self-pay | Admitting: Surgical

## 2023-12-02 IMAGING — CT CT KNEE*L* W/O CM
1 series · 12 of 14 positions shown, 15 images · non-contrast
Comparison: None Available.

CLINICAL DATA: Continued left knee pain, evaluate components of
arthroplasty. Arthroplasty dated 03/14/2022.

EXAM:
CT OF THE LEFT KNEE WITHOUT CONTRAST
TECHNIQUE: Multidetector CT imaging of the left knee was performed according to
the standard protocol. Multiplanar CT image reconstructions were
also generated.
RADIATION DOSE REDUCTION: This exam was performed according to the
departmental dose-optimization program which includes automated
exposure control, adjustment of the mA and/or kV according to
patient size and/or use of iterative reconstruction technique.

[Series 4: knee soft tissue (person_name) · axial · 0.46mm/px · z∈[-890,-698]mm · 12 of 76 slices shown, 15 images]
[im 6/76  soft-tissue]
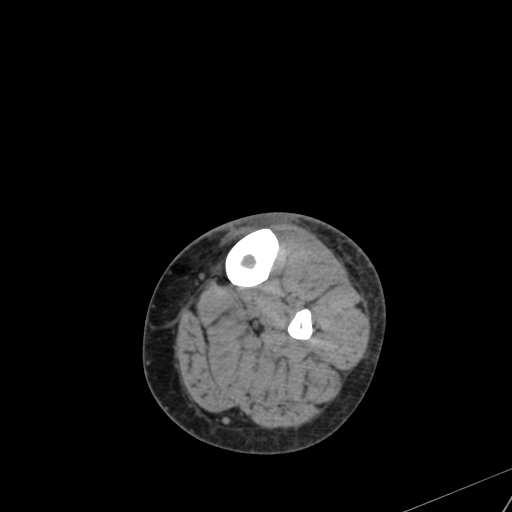
[im 6/76  bone]
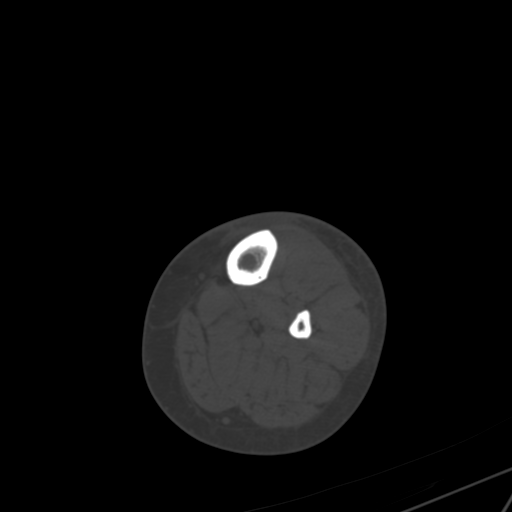
[im 12/76  bone]
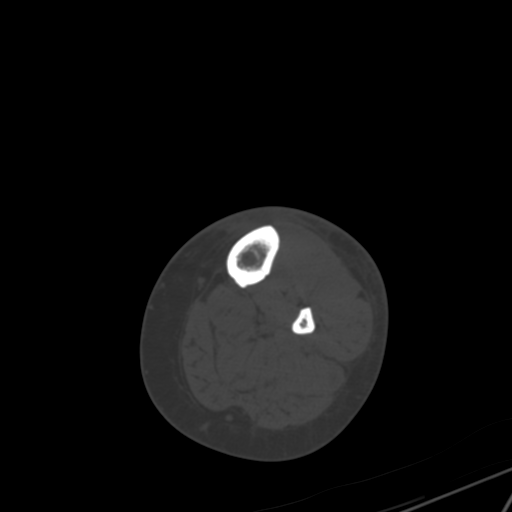
[im 18/76  bone]
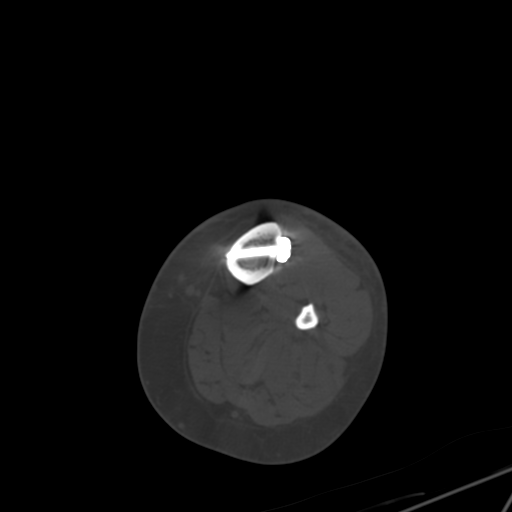
[im 24/76  bone]
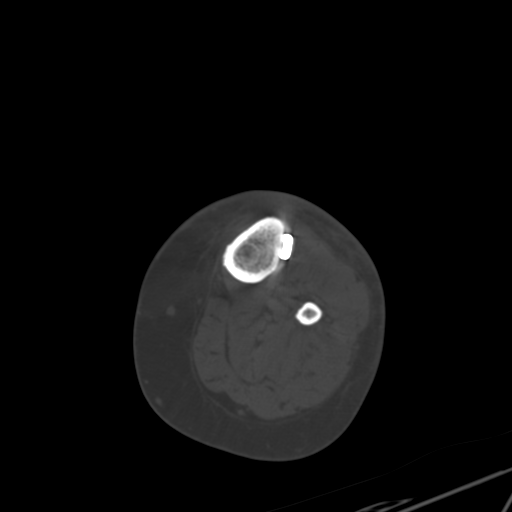
[im 29/76  soft-tissue]
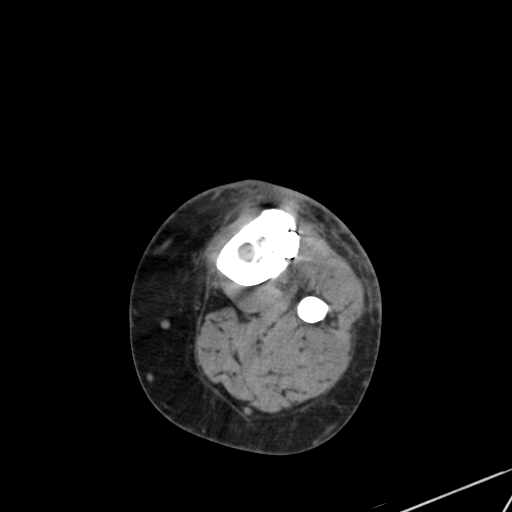
[im 29/76  bone]
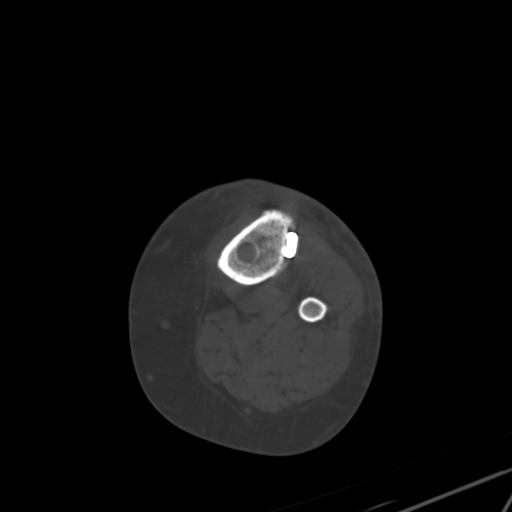
[im 35/76  bone]
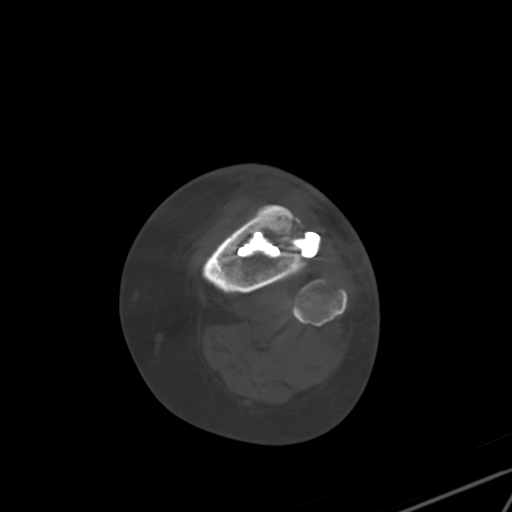
[im 41/76  bone]
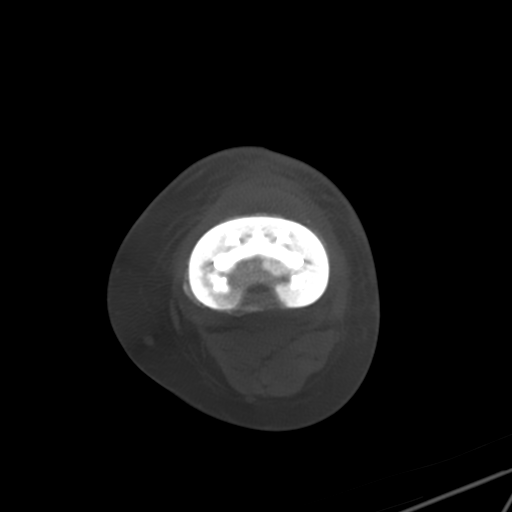
[im 47/76  bone]
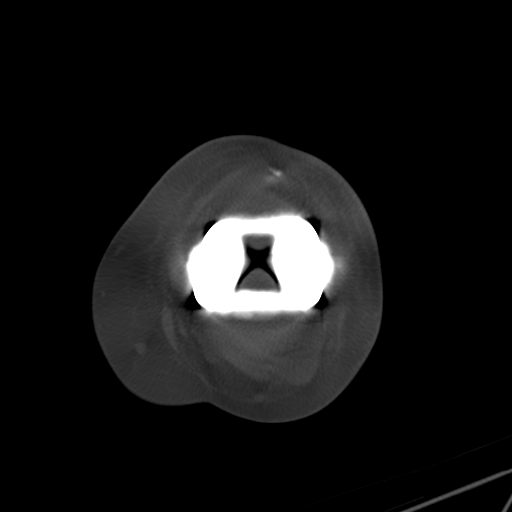
[im 52/76  soft-tissue]
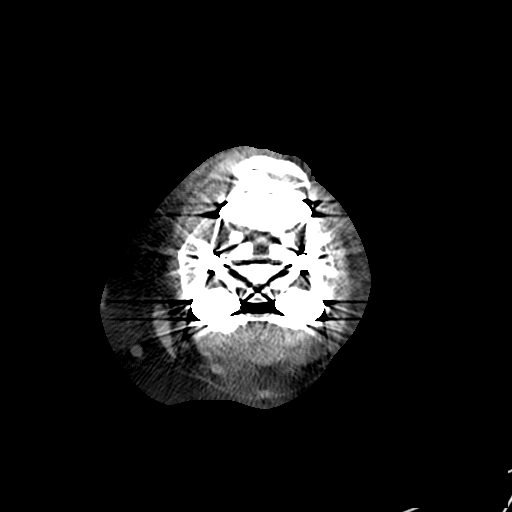
[im 52/76  bone]
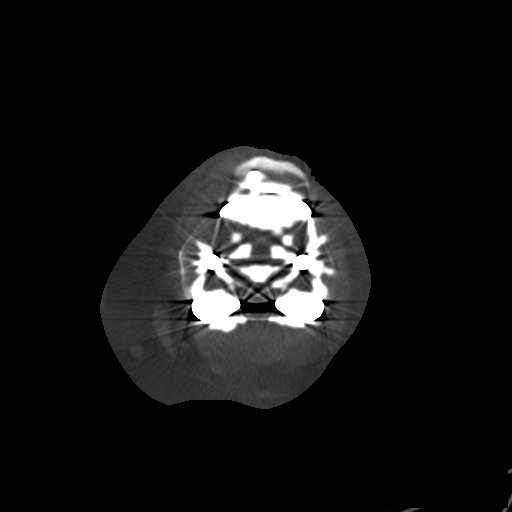
[im 58/76  bone]
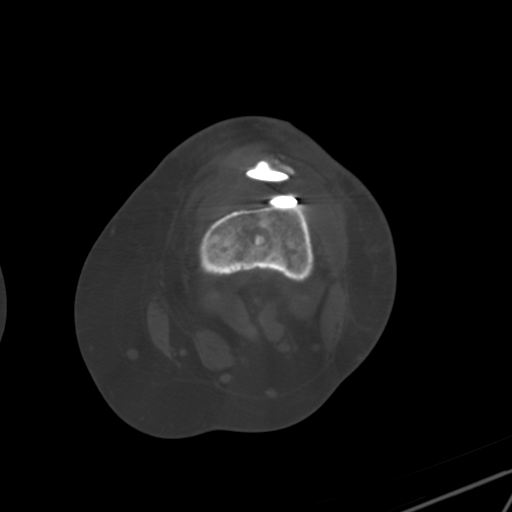
[im 64/76  bone]
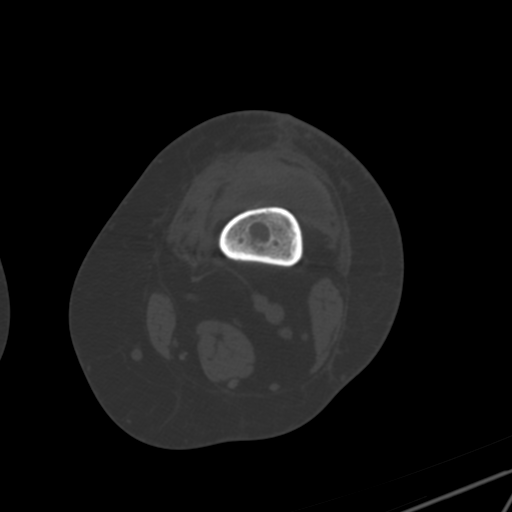
[im 70/76  bone]
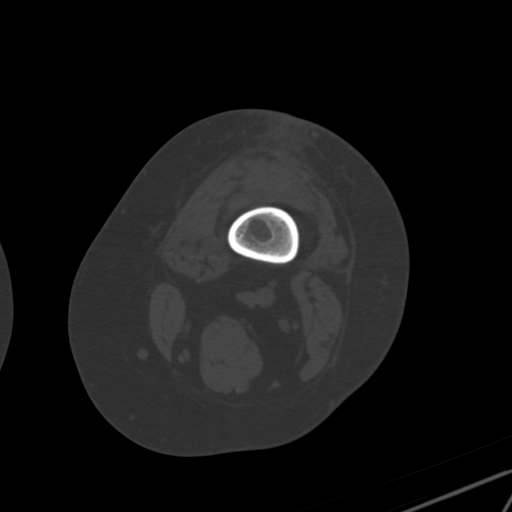

[12 of 14 positions shown; findings below may reference images not displayed]

FINDINGS: Bones/Joint/Cartilage

Status post left knee arthroplasty. The hardware is intact. There is
also plate and screw fixation of the proximal tibia with intact
screws. Moderate size suprapatellar joint effusion.

Ligaments

Suboptimally assessed by CT.

Muscles and Tendons

Muscles are normal in bulk. No intramuscular fluid collection or
hematoma.

Soft tissues

Postsurgical changes in the anterior aspect of the knee. No fluid
collection or abscess. Mild subcutaneous soft tissue edema.
IMPRESSION: 1. Status post left knee arthroplasty with intact hardware. No
perihardware loosening or fracture.

2.  Moderate suprapatellar joint effusion.

3. Muscles and surrounding subcutaneous soft tissues are within
normal limits with mild subcutaneous soft tissue edema about the
anterior aspect of the knee.

## 2024-01-10 ENCOUNTER — Encounter: Payer: Self-pay | Admitting: Registered Nurse

## 2024-01-10 ENCOUNTER — Ambulatory Visit: Payer: No Typology Code available for payment source | Admitting: Registered Nurse

## 2024-01-10 VITALS — Resp 16

## 2024-01-10 DIAGNOSIS — Z96652 Presence of left artificial knee joint: Secondary | ICD-10-CM

## 2024-01-10 DIAGNOSIS — M7652 Patellar tendinitis, left knee: Secondary | ICD-10-CM

## 2024-01-10 DIAGNOSIS — M25662 Stiffness of left knee, not elsewhere classified: Secondary | ICD-10-CM

## 2024-01-10 DIAGNOSIS — G8929 Other chronic pain: Secondary | ICD-10-CM

## 2024-01-10 NOTE — Progress Notes (Signed)
Subjective:    Patient ID: Stephanie Chandler, female    DOB: 02/09/1966, 58 y.o.   MRN: [id number]  57y/o african [place] female established patient here for evaluation left knee pain, stiffness and swelling.  Worse this week past couple of days unsure if related to weather and arthritis flare up or something else.  Lidocaine patches topical and riding bike stationary not helping and making it difficult to work today.  Denied color changes, trauma, fall, bruising.  Has had joint replacement and chronic pain since surgery and had second opinion/imaging with another orthopedic provider in the past year. This pain worse than her usual baseline pain. Denied fever/chills/rash.     Review of Systems  Constitutional:  Negative for chills and fever.  HENT:  Negative for trouble swallowing and voice change.   Respiratory:  Negative for shortness of breath, wheezing and stridor.   Gastrointestinal:  Negative for diarrhea, nausea and vomiting.  Musculoskeletal:  Positive for arthralgias, gait problem and joint swelling. Negative for neck pain and neck stiffness.  Skin:  Negative for color change, pallor, rash and wound.  Neurological:  Negative for tremors, syncope and weakness.  Hematological:  Does not bruise/bleed easily.  Psychiatric/Behavioral:  Positive for sleep disturbance. Negative for agitation and confusion.        Objective:   Physical Exam Vitals and nursing note reviewed.  Constitutional:      General: She is awake. She is not in acute distress.    Appearance: Normal appearance. She is well-developed, well-groomed and overweight. She is not ill-appearing, toxic-appearing or diaphoretic.  HENT:     Head: Normocephalic and atraumatic.     Jaw: There is normal jaw occlusion.     Salivary Glands: Right salivary gland is not diffusely enlarged. Left salivary gland is not diffusely enlarged.     Right Ear: Hearing and external ear normal. No decreased hearing noted.     Left Ear: Hearing  and external ear normal. No decreased hearing noted.     Nose: Nose normal. No congestion or rhinorrhea.     Mouth/Throat:     Lips: Pink. No lesions.     Mouth: Mucous membranes are moist. No oral lesions or angioedema.     Dentition: No gum lesions.     Pharynx: Oropharynx is clear.  Eyes:     General: Lids are normal. Vision grossly intact. Gaze aligned appropriately. No allergic shiner or scleral icterus.       Right eye: No discharge.        Left eye: No discharge.     Extraocular Movements: Extraocular movements intact.     Conjunctiva/sclera: Conjunctivae normal.     Pupils: Pupils are equal, round, and reactive to light.  Neck:     Trachea: Trachea and phonation normal.  Cardiovascular:     Rate and Rhythm: Normal rate and regular rhythm.     Pulses:          Radial pulses are 2+ on the right side and 2+ on the left side.  Pulmonary:     Effort: Pulmonary effort is normal.     Breath sounds: Normal breath sounds and air entry. No stridor or transmitted upper airway sounds. No wheezing.     Comments: Spoke full sentences without difficulty; no cough observed in exam room Abdominal:     General: Abdomen is flat.  Musculoskeletal:     Right hand: Normal strength. Normal capillary refill.     Left hand: Normal strength. Normal  capillary refill.     Cervical back: Normal range of motion and neck supple. No swelling, edema, deformity, erythema, signs of trauma, lacerations, rigidity, torticollis or crepitus. No pain with movement. Normal range of motion.     Thoracic back: No swelling, edema, deformity, signs of trauma or lacerations. Normal range of motion.     Right hip: No deformity, lacerations or crepitus. Normal range of motion. Normal strength.     Left hip: No deformity, lacerations or crepitus. Normal range of motion. Normal strength.     Right upper leg: No swelling, edema, deformity, lacerations or tenderness.     Left upper leg: No swelling, edema, deformity,  lacerations or tenderness.     Right knee: Crepitus present. No swelling, effusion, erythema, ecchymosis or lacerations. Normal range of motion. No tenderness.     Left knee: Swelling and crepitus present. No effusion, erythema, ecchymosis or lacerations. Decreased range of motion. Tenderness present.     Right lower leg: No swelling, deformity, lacerations or tenderness. No edema.     Left lower leg: No swelling, deformity, lacerations or tenderness. No edema.     Right ankle: No swelling, ecchymosis or lacerations. No tenderness. Normal range of motion.     Left ankle: No swelling, ecchymosis or lacerations. No tenderness. Normal range of motion.       Legs:     Comments: Proximal left knee feels swollen to patient, stiff no matter how much activity and pain with AROM/weight bearing all day; mild favoring of left leg with ambulation; on exam table non weight bearing AROM slow left knee due to pain; over skin tight jeans diameter left 16.25" feels puffy compared to right anterior distal quadriceps; right diameter 17" medial patella/fossa circumference; left flexion slow due to pain and 5 degrees less than right; mild laxity left noted on valgus/varus stress test versus right tight; crepitus left greater than right with flexion/extension non weight bearing; negative anterior/posterior drawer and lachmann's tests; not TTP bilateral joint lines or posterior fossas  Lymphadenopathy:     Head:     Right side of head: No submandibular or preauricular adenopathy.     Left side of head: No submandibular or preauricular adenopathy.     Cervical:     Right cervical: No superficial cervical adenopathy.    Left cervical: No superficial cervical adenopathy.  Skin:    General: Skin is warm and dry.     Capillary Refill: Capillary refill takes less than 2 seconds.     Coloration: Skin is not ashen, cyanotic, jaundiced, mottled, pale or sallow.     Findings: No abrasion, abscess, acne, bruising, burn,  ecchymosis, erythema, signs of injury, laceration, lesion, petechiae, rash or wound.     Nails: There is no clubbing.     Comments: Face/neck and hands visually inspected  Neurological:     General: No focal deficit present.     Mental Status: She is alert and oriented to person, place, and time. Mental status is at baseline.     GCS: GCS eye subscore is 4. GCS verbal subscore is 5. GCS motor subscore is 6.     Cranial Nerves: Cranial nerves 2-12 are intact. No cranial nerve deficit, dysarthria or facial asymmetry.     Motor: Motor function is intact. No weakness, tremor, atrophy, abnormal muscle tone or seizure activity.     Coordination: Coordination is intact. Coordination normal.     Gait: Gait is intact. Gait normal.     Comments: In/out of  chair and on/off exam table without difficulty; gait sure and steady in clinic; bilateral hand grasp equal 5/5  Psychiatric:        Attention and Perception: Attention and perception normal.        Mood and Affect: Mood and affect normal.        Speech: Speech normal.        Behavior: Behavior normal. Behavior is cooperative.        Thought Content: Thought content normal.        Cognition and Memory: Cognition and memory normal.        Judgment: Judgment normal.    Fitted and dispensed 1 medium knee compression from clinic stock to patient.  Assisted with application and discussed care e.g. hand wash and dry do not place in machine.  May use blow dryer to speed drying time.  Patient reported some relief of pain with application immediately.  May continue lidocaine patch from orthopedics, biofreeze and oral pain medications which were rx previously for prn knee pain use.  Seek re-evaluation if new erythema/worsening swelling/ecchymosis or hot/red joint.  Patient agreed with plan of care and had no further questions at this time.      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  Had recent cold front freezing temperatures discussed can flare arthritis.  Head recommended 15 minutes QID, gentle arom.  Exitcare handout patellar tendonitis, rehab exercises, osteoarthritis. May wear compression sleeve when awake remove when sleeping.  May use pillow between knees during sleep.  If needs work restrictions will need to see orthopedics/PCM as unable to write work restrictions in this clinic due to contract limitations.  No known work injury.  Patient agreed with plan of care and had no further questions at this time.

## 2024-01-10 NOTE — Patient Instructions (Addendum)
Osteoarthritis  Osteoarthritis is a type of arthritis. It refers to joint pain or joint disease. Osteoarthritis affects tissue that covers the ends of bones in joints (cartilage). Cartilage acts as a cushion between the bones and helps them move smoothly. Osteoarthritis occurs when cartilage in the joints gets worn down. Osteoarthritis is sometimes called "wear and tear" arthritis. Osteoarthritis is the most common form of arthritis. It often occurs in older people. It is a condition that gets worse over time. The joints most often affected by this condition are in the fingers, toes, hips, knees, and spine, including the neck and lower back. What are the causes? This condition is caused by the wearing down of cartilage that covers the ends of bones. What increases the risk? The following factors may make you more likely to develop this condition: Being age 59 or older. Obesity. Overuse of joints. Past injury of a joint. Past surgery on a joint. Family history of osteoarthritis. What are the signs or symptoms? The main symptoms of this condition are pain, swelling, and stiffness in the joint. Other symptoms may include: An enlarged joint. More pain and further damage caused by small pieces of bone or cartilage that break off and float inside of the joint. Small deposits of bone (osteophytes) that grow on the edges of the joint. A grating or scraping feeling inside the joint when you move it. Popping or creaking sounds when you move. Difficulty walking or exercising. An inability to grip items, twist your hand, or control the movements of your hands and fingers. How is this diagnosed? This condition may be diagnosed based on: Your medical history. A physical exam. Your symptoms. X-rays of the affected joints. Blood tests to rule out other types of arthritis. How is this treated? There is no cure for this condition, but treatment can help control pain and improve joint function. Treatment  may include a combination of therapies, such as: Pain relief techniques, such as: Applying heat and cold to the joint. Massage. A form of talk therapy called cognitive behavioral therapy (CBT). This therapy helps you set goals and follow up on the changes that you make. Medicines for pain and inflammation. The medicines can be taken by mouth or applied to the skin. They include: NSAIDs, such as ibuprofen. Prescription medicines. Strong anti-inflammatory medicines (corticosteroids). Certain nutritional supplements. A prescribed exercise program. You may work with a physical therapist. Assistive devices, such as a brace, wrap, splint, specialized glove, or cane. A weight control plan. Surgery, such as: An osteotomy. This is done to reposition the bones and relieve pain or to remove loose pieces of bone and cartilage. Joint replacement surgery. You may need this surgery if you have advanced osteoarthritis. Follow these instructions at home: Activity Rest your affected joints as told by your health care provider. Exercise as told by your provider. The provider may recommend specific types of exercise, such as: Strengthening exercises. These are done to strengthen the muscles that support joints affected by arthritis. Aerobic activities. These are exercises, such as brisk walking or water aerobics, that increase your heart rate. Range-of-motion activities. These help your joints move more easily. Balance and agility exercises. Managing pain, stiffness, and swelling     If told, apply heat to the affected area as often as told by your provider. Use the heat source that your provider recommends, such as a moist heat pack or a heating pad. If you have a removable assistive device, remove it as told by your provider. Place a  towel between your skin and the heat source. If your provider tells you to keep the assistive device on while you apply heat, place a towel between the assistive device and  the heat source. Leave the heat on for 20-30 minutes. If told, put ice on the affected area. If you have a removable assistive device, remove it as told by your provider. Put ice in a plastic bag. Place a towel between your skin and the bag. If your provider tells you to keep the assistive device on during icing, place a towel between the assistive device and the bag. Leave the ice on for 20 minutes, 2-3 times a day. If your skin turns bright red, remove the ice or heat right away to prevent skin damage. The risk of damage is higher if you cannot feel pain, heat, or cold. Move your fingers or toes often to reduce stiffness and swelling. Raise (elevate) the affected area above the level of your heart while you are sitting or lying down. General instructions Take over-the-counter and prescription medicines only as told by your provider. Maintain a healthy weight. Follow instructions from your provider for weight control. Do not use any products that contain nicotine or tobacco. These products include cigarettes, chewing tobacco, and vaping devices, such as e-cigarettes. If you need help quitting, ask your provider. Use assistive devices as told by your provider. Where to find more information [employer] of Arthritis and Musculoskeletal and Skin Diseases: niams.http://www.myers.net/ [employer] on Aging: BaseRingTones.pl American College of Rheumatology: rheumatology.org Contact a health care provider if: You have redness, swelling, or a feeling of warmth in a joint that gets worse. You have a fever along with joint or muscle aches. You develop a rash. You have trouble doing your normal activities. You have pain that gets worse and is not relieved by pain medicine. This information is not intended to replace advice given to you by your health care provider. Make sure you discuss any questions you have with your health care provider. Document Revised: 07/13/2022 Document Reviewed:  07/13/2022 Elsevier Patient Education  2024 [employer]  Patellar Tendinitis Rehab Ask your health care provider which exercises are safe for you. Do exercises exactly as told by your health care provider and adjust them as directed. It is normal to feel mild stretching, pulling, tightness, or discomfort as you do these exercises. Stop right away if you feel sudden pain or your pain gets worse. Do not begin these exercises until told by your health care provider. Stretching and range-of-motion exercise This exercise warms up your muscles and joints and improves the movement and flexibility of your knee. The exercise also helps to relieve pain and stiffness. Hamstring, doorway stretch This is an exercise in which you lie in a doorway and prop your leg on a wall to stretch the back of your knee and thigh (hamstring). Lie on your back in front of a doorway with your left / right leg resting against the wall and your other leg flat on the floor in the doorway. There should be a slight bend in your left / right knee. Straighten your left / right knee. You should feel a stretch behind your knee or thigh. If you do not, scoot your buttocks closer to the door. Hold this position for _____30_____ seconds. Repeat ______3____ times. Complete this exercise _______2___ times a day. Strengthening exercises These exercises build strength and endurance in your knee. Endurance is the ability to use your muscles for a long time, even  after they get tired. Quadriceps, isometric This exercise stretches the muscles in front of your thigh (quadriceps) without moving your knee joint (isometric). Lie on your back with your left / right leg extended and your other knee bent. Slowly tense the muscles in the front of your left / right thigh. When you do this, you should see your kneecap slide up toward your hip or see increased dimpling just above the knee. This motion will push the back of your knee toward the floor. If  this is painful, try putting a rolled-up hand towel under your knee to support it in a bent position. Change the size of the towel to find a position that allows you to do this exercise without any pain. For ___30_______ seconds, hold the muscle as tight as you can without increasing your pain. Relax the muscles slowly and completely. Repeat _____3_____ times. Complete this exercise ____2______ times a day. Straight leg raises, flexors This exercise stretches the muscles in front of your thigh (quadriceps) and the muscles that move your hips (hip flexors). Lie on your back with your left / right leg extended and your other knee bent. Tense the muscles in the front of your left / right thigh. When you do this, you should see your kneecap slide up or see increased dimpling just above the knee. Keep these muscles tight as you raise your leg 4-6 inches (10-15 cm) off the floor. Do not let your moving knee bend. Hold this position for ___30_______ seconds. Keep these muscles tense as you slowly lower your leg. Relax your muscles slowly and completely. Repeat _____3_____ times. Complete this exercise _____2_____ times a day. Squats This is a weight-bearing exercise in which you bend your knees and lower your hips while engaging your thigh muscles. Stand in front of a table, with your feet and knees pointing straight ahead. You may rest your hands on the table for balance but not for support. Slowly bend your knees and lower your hips like you are going to sit in a chair. Keep your weight over your heels, not over your toes. Keep your lower legs upright so they are parallel with the table legs. Do not let your hips go lower than your knees. Do not bend lower than told by your health care provider. If your knee pain increases, do not bend as low. Hold the squat position for _____30_____ seconds. Slowly push with your legs to return to standing. Do not use your hands to pull yourself to  standing. Repeat ____3______ times. Complete this exercise _______2___ times a day. Step-downs This is an exercise in which you step down slowly while engaging your leg muscles. Stand on the edge of a step. Keeping your weight over your left / right heel, slowly bend your left / right knee to bring your left / right heel toward the floor. Lower your heel as far as you can while keeping control and without increasing any discomfort. Do not let your left / right knee come forward. Use your leg muscles, not gravity, to lower your body. Hold on to a wall or rail for balance if needed. Slowly push through your heel to lift your body weight back up. Return to the starting position. Repeat _____3_____ times. Complete this exercise ____2______ times a day. Straight leg raises, abductors This exercise strengthens the muscles that rotate the leg at the hip and move it away from your body (hip abductors). Lie on your side with your left / right leg in  the top position. Lie so your head, shoulder, knee, and hip line up. You may bend your bottom knee to help you keep your balance. Roll your hips slightly forward, so that your hips are stacked directly over each other and your left / right knee is facing forward. Leading with your heel, lift your top leg 4-6 inches (10-15 cm). You should feel the muscles in your outer hip lifting. Do not let your foot drift forward. Do not let your knee roll toward the ceiling. Hold this position for ____30______ seconds. Slowly lower your leg to the starting position. Let your muscles relax completely after each repetition. Repeat ______3____ times. Complete this exercise ____2______ times a day. This information is not intended to replace advice given to you by your health care provider. Make sure you discuss any questions you have with your health care provider. Document Revised: 06/21/2021 Document Reviewed: 06/21/2021 Elsevier Patient Education  2024 Elsevier  Inc.Patellar Tendinitis  Patellar tendinitis is also called jumper's knee or patellar tendinopathy. This condition happens when there is damage to the patellar tendon. Tendons are cord-like tissues that connect muscles to bones. The patellar tendon connects the bottom of the kneecap (patella) to the top of the shin bone (tibia). Patellar tendinitis causes pain in the front of the knee. The condition is classified into the following stages: Stage 1: You have pain only after activity. Stage 2: You have pain during and after activity. Stage 3: You have pain at rest as well as during and after activity. The pain limits your ability to do the activity. Stage 4: You have tendon tears. The tears severely limit your activity. What are the causes? This condition is caused by repeated (repetitive) stress on the tendon. This stress may cause the tendon to stretch, swell, thicken, or tear. What increases the risk? The following factors may make you more likely to develop this condition: Participating in sports that involve running, kicking, and jumping, especially on hard surfaces. These include: Basketball. Volleyball. Soccer. Track and field. Training too hard. Having tight thigh muscles. Having received steroid injections in the tendon. Having had knee surgery. Being 33-54 years old. Having rheumatoid arthritis, diabetes, or kidney disease. These conditions interrupt blood flow to the knee, causing the tendon to weaken. What are the signs or symptoms? The main symptom of this condition is pain and swelling in the front of the knee. The pain usually starts slowly and gradually gets worse. It may become painful to straighten your leg. The pain may get worse when you walk, run, or jump. How is this diagnosed? This condition may be diagnosed based on: Your symptoms. Your medical history. A physical exam. During the physical exam, your health care provider may check for: Tenderness along the tendon  just below the patella. Tightness in your thigh muscles. Pain when you straighten your knee. Imaging tests, including: X-rays. These will show the position and condition of your patella. An MRI. This will show any abnormality of the tendon. Ultrasound. This will show any swelling or other abnormalities of the tendon. How is this treated? Treatment for this condition depends on the stage of the condition. It may involve: Avoiding activities that cause pain, such as jumping. Icing and elevating your knee. Having sound wave stimulation to promote healing. Doing physical therapy exercises to improve movement and strength in your knee when pain and swelling improve. Wearing a knee brace. This may be needed if your condition does not improve with treatment. Using crutches or a walker.  This may be needed if your condition does not improve with treatment. Surgery. This may be done if you have stage 4 tendinitis. Follow these instructions at home: If you have a removable brace: Wear the brace as told by your health care provider. Remove it only as told by your health care provider. Check the skin around the brace every day. Tell your health care provider about any concerns. Loosen the brace if your toes tingle, become numb, or turn cold and blue. Keep the brace clean. If the brace is not waterproof: Do not let it get wet. Cover it with a watertight covering when you take a bath or shower. Ask your health care provider when it is safe for you to drive. Managing pain, stiffness, and swelling  If directed, put ice on the injured area. To do this: If you have a removable brace, remove it as told by your health care provider. Put ice in a plastic bag. Place a towel between your skin and the bag. Leave the ice on for 20 minutes, 2-3 times a day. Remove the ice if your skin turns bright red. This is very important. If you cannot feel pain, heat, or cold, you have a greater risk of damage to the  area. Move your toes often to reduce stiffness and swelling. Raise (elevate) your knee above the level of your heart while you are sitting or lying down. Activity Do not use the injured limb to support your body weight until your health care provider says that you can. Use your crutches or a walker as told by your health care provider. Do exercises as told by your health care provider or physical therapist. Return to your normal activities as told by your health care provider. Ask your health care provider what activities are safe for you. General instructions Take over-the-counter and prescription medicines only as told by your health care provider. Do not use any products that contain nicotine or tobacco. These products include cigarettes, chewing tobacco, and vaping devices, such as e-cigarettes. These can delay healing. If you need help quitting, ask your health care provider. Keep all follow-up visits. This is important. How is this prevented? Warm up and stretch before being active. Cool down and stretch after being active. Give your body time to rest between periods of activity. You may need to reduce how often you play a sport that requires frequent jumping. Make sure to use equipment that fits you. Be safe and responsible while being active. This will help you avoid falls which can damage the tendon. Do at least 150 minutes of moderate-intensity exercise each week, such as brisk walking or water aerobics. Maintain physical fitness, including: Strength. Flexibility. Cardiovascular fitness. Endurance. Contact a health care provider if: Your symptoms have not improved in 6 weeks. Your symptoms get worse. Summary Patellar tendinitis is also called jumper's knee or patellar tendinopathy. This condition happens when there is damage to the patellar tendon. Treatment for this condition depends on the stage of the condition and may include rest, ice, exercises, a knee brace, and  surgery. Do not use the injured limb to support your body weight until your health care provider says that you can. Take over-the-counter and prescription medicines only as told by your health care provider. This information is not intended to replace advice given to you by your health care provider. Make sure you discuss any questions you have with your health care provider. Document Revised: 06/21/2021 Document Reviewed: 06/21/2021 Elsevier Patient Education  2024  [employer].

## 2024-01-15 ENCOUNTER — Telehealth: Payer: Self-pay | Admitting: Registered Nurse

## 2024-01-15 ENCOUNTER — Encounter: Payer: Self-pay | Admitting: Registered Nurse

## 2024-01-15 DIAGNOSIS — J00 Acute nasopharyngitis [common cold]: Secondary | ICD-10-CM

## 2024-01-15 MED ORDER — SALINE SPRAY 0.65 % NA SOLN: 2.0000 | NASAL | Status: AC

## 2024-01-15 MED ORDER — FLUTICASONE PROPIONATE 50 MCG/ACT NA SUSP: 1.0000 | Freq: Two times a day (BID) | NASAL | 6 refills | Status: AC

## 2024-01-15 NOTE — Telephone Encounter (Signed)
Patient requested refill fluticasone nasal 1 spray each nostril BID #[id number] using for seasonal allergies and colds prn.

## 2024-02-07 ENCOUNTER — Other Ambulatory Visit: Payer: Self-pay

## 2024-02-07 DIAGNOSIS — Z0000 Encounter for general adult medical examination without abnormal findings: Secondary | ICD-10-CM

## 2024-02-08 LAB — CMP12+LP+TP+TSH+6AC+CBC/D/PLT
ALT: 33 IU/L — ABNORMAL HIGH (ref 0–32)
AST: 25 IU/L (ref 0–40)
Albumin: 4.1 g/dL (ref 3.8–4.9)
Alkaline Phosphatase: 91 IU/L (ref 44–121)
BUN/Creatinine Ratio: 17 (ref 9–23)
BUN: 15 mg/dL (ref 6–24)
Basophils Absolute: 0 10*3/uL (ref 0.0–0.2)
Basos: 1 %
Bilirubin Total: 0.2 mg/dL (ref 0.0–1.2)
Calcium: 9 mg/dL (ref 8.7–10.2)
Chloride: 104 mmol/L (ref 96–106)
Chol/HDL Ratio: 2.5 ratio (ref 0.0–4.4)
Cholesterol, Total: 130 mg/dL (ref 100–199)
Creatinine, Ser: 0.9 mg/dL (ref 0.57–1.00)
EOS (ABSOLUTE): 0.2 10*3/uL (ref 0.0–0.4)
Eos: 3 %
Estimated CHD Risk: <0.5 times avg. (ref 0.0–1.0)
Free Thyroxine Index: 2 (ref 1.2–4.9)
GGT: 26 IU/L (ref 0–60)
Globulin, Total: 2.4 g/dL (ref 1.5–4.5)
Glucose: 100 mg/dL — ABNORMAL HIGH (ref 70–99)
HDL: 52 mg/dL (ref >39)
Hematocrit: 38.4 % (ref 34.0–46.6)
Hemoglobin: 12.4 g/dL (ref 11.1–15.9)
Immature Grans (Abs): 0 10*3/uL (ref 0.0–0.1)
Immature Granulocytes: 0 %
Iron: 82 ug/dL (ref 27–159)
LDH: 177 IU/L (ref 119–226)
LDL Chol Calc (NIH): 65 mg/dL (ref 0–99)
Lymphocytes Absolute: 1.6 10*3/uL (ref 0.7–3.1)
Lymphs: 25 %
MCH: 29.6 pg (ref 26.6–33.0)
MCHC: 32.3 g/dL (ref 31.5–35.7)
MCV: 92 fL (ref 79–97)
Monocytes Absolute: 0.5 10*3/uL (ref 0.1–0.9)
Monocytes: 8 %
Neutrophils Absolute: 4.1 10*3/uL (ref 1.4–7.0)
Neutrophils: 63 %
Phosphorus: 3.3 mg/dL (ref 3.0–4.3)
Platelets: 296 10*3/uL (ref 150–450)
Potassium: 4 mmol/L (ref 3.5–5.2)
RBC: 4.19 x10E6/uL (ref 3.77–5.28)
RDW: 12.4 % (ref 11.7–15.4)
Sodium: 141 mmol/L (ref 134–144)
T3 Uptake Ratio: 29 % (ref 24–39)
T4, Total: 6.8 ug/dL (ref 4.5–12.0)
TSH: 0.832 u[IU]/mL (ref 0.450–4.500)
Total Protein: 6.5 g/dL (ref 6.0–8.5)
Triglycerides: 63 mg/dL (ref 0–149)
Uric Acid: 3.1 mg/dL (ref 3.0–7.2)
VLDL Cholesterol Cal: 13 mg/dL (ref 5–40)
WBC: 6.4 10*3/uL (ref 3.4–10.8)
eGFR: 74 mL/min/{1.73_m2} (ref >59)

## 2024-02-08 LAB — HEMOGLOBIN A1C
Est. average glucose Bld gHb Est-mCnc: 117 mg/dL
Hgb A1c MFr Bld: 5.7 % — ABNORMAL HIGH (ref 4.8–5.6)

## 2024-02-11 ENCOUNTER — Encounter: Payer: Self-pay | Admitting: Registered Nurse

## 2024-02-18 ENCOUNTER — Ambulatory Visit: Payer: Self-pay

## 2024-02-27 NOTE — Progress Notes (Signed)
 LDL less than 130, BP greater than 135/85 and A1c less than 7 met requirements for 2026 [employer] insurance premium discount evaluation:   BP 144/84 LDL 65 Hgba1c 5.7 weight 189lbs BMI 35 previous year 115/72, 94, 5.6, 187lbs 36 Per epic reviewed results/results note electronically 02/16/2024  Patient is a nonsmoker  Epic reviewed by NP and transcribed labs to [employer] forms and signed by provider.  Tobacco attestation signed. [employer] formed signed by patient with RN [name]. Forms placed in paper chart located at [facility].   Notified of [facility] hours RN M-Th [phone]; NP Tuesday 09-12 and Thursday 11a-2p and Clinic Email [email or web address]).  [facility] form given to HR Tonya 02/26/2024 regarding patient met requirements for insurance discount FY 2026

## 2024-04-01 ENCOUNTER — Encounter: Payer: Self-pay | Admitting: Registered Nurse

## 2024-04-01 ENCOUNTER — Ambulatory Visit: Payer: Self-pay

## 2024-04-01 VITALS — BP 128/77 | HR 90

## 2024-04-01 DIAGNOSIS — R7303 Prediabetes: Secondary | ICD-10-CM

## 2024-04-01 LAB — GLUCOSE, POCT (MANUAL RESULT ENTRY): POC Glucose: 89 mg/dL (ref 70–99)

## 2024-04-01 NOTE — Progress Notes (Signed)
 Employee request BS check non-fasting, and feeling light-headesness

## 2024-05-08 ENCOUNTER — Encounter: Payer: Self-pay | Admitting: Registered Nurse

## 2024-05-08 ENCOUNTER — Ambulatory Visit: Payer: Self-pay | Admitting: Registered Nurse

## 2024-05-08 VITALS — BP 124/80 | HR 70 | Temp 98.0°F

## 2024-05-08 DIAGNOSIS — K5903 Drug induced constipation: Secondary | ICD-10-CM

## 2024-05-08 NOTE — Patient Instructions (Signed)
 Magnesium Oral Supplements What is this medication? MAGNESIUM (mag NEE zee um) prevents and treats low levels of magnesium in your body. Magnesium plays an important role in maintaining the health of your muscles and nervous system. This medicine may be used for other purposes; ask your health care provider or pharmacist if you have questions. What should I tell my care team before I take this medication? They need to know if you have any of these conditions: Health issues you have now or have had in the past An unusual or allergic reaction to any medication, supplement, food, dye, or preservative Pregnant or trying to get pregnant Breastfeeding How should I use this medication? Take this supplement as directed on the label. Do not take it more often than recommended. Talk to your care team if you take other medications or supplements. Talk to your care team before giving supplements to a child. Special care may be needed. Overdosage: If you think you have taken too much of this medicine contact a poison control center or emergency room at once. NOTE: This medicine is only for you. Do not share this medicine with others. What if I miss a dose? Do not use this supplement more often than directed. What may interact with this medication? Other medications or supplements may interact with this product. This list may not describe all possible interactions. Give your health care provider a list of all the medicines, herbs, non-prescription drugs, or dietary supplements you use. Also tell them if you smoke, drink alcohol, or use illegal drugs. Some items may interact with your medicine. What should I watch for while using this medication? See your care team for regular checks on your progress. What side effects may I notice from receiving this medication? Side effects that you should report to your care team as soon as possible: Allergic reactions--skin rash, itching, hives, swelling of the face,  lips, tongue, or throat High magnesium level--confusion, drowsiness, facial flushing, redness, sweating, muscle weakness, fast or irregular heartbeat, trouble breathing Side effects that usually do not require medical attention (report these to your care team if they continue or are bothersome): Diarrhea This list may not describe all possible side effects. Call your doctor for medical advice about side effects. You may report side effects to FDA at 1-800-FDA-1088. Where should I keep my medication? Keep out of the reach of children and pets. Most supplements should be stored at room temperature. Check your specific product directions. Protect from heat and moisture. Get rid of any unused supplement after the expiration date. NOTE: This sheet is a summary. It may not cover all possible information. If you have questions about this medicine, talk to your doctor, pharmacist, or health care provider.  2025 Elsevier/Gold Standard (2023-04-16 00:00:00)Constipation, Adult Constipation is when a person has fewer than three bowel movements in a week, has difficulty having a bowel movement, or has stools (feces) that are dry, hard, or larger than normal. Constipation may be caused by an underlying condition. It may become worse with age if a person takes certain medicines and does not take in enough fluids. Follow these instructions at home: Eating and drinking  Eat foods that have a lot of fiber, such as beans, whole grains, and fresh fruits and vegetables. Limit foods that are low in fiber and high in fat and processed sugars, such as fried or sweet foods. These include french fries, hamburgers, cookies, candies, and soda. Drink enough fluid to keep your urine pale yellow. General instructions Exercise  regularly or as told by your health care provider. Try to do 150 minutes of moderate exercise each week. Use the bathroom when you have the urge to go. Do not hold it in. Take over-the-counter and  prescription medicines only as told by your health care provider. This includes any fiber supplements. During bowel movements: Practice deep breathing while relaxing the lower abdomen. Practice pelvic floor relaxation. Watch your condition for any changes. Let your health care provider know about them. Keep all follow-up visits as told by your health care provider. This is important. Contact a health care provider if: You have pain that gets worse. You have a fever. You do not have a bowel movement after 4 days. You vomit. You are not hungry or you lose weight. You are bleeding from the opening between the buttocks (anus). You have thin, pencil-like stools. Get help right away if: You have a fever and your symptoms suddenly get worse. You leak stool or have blood in your stool. Your abdomen is bloated. You have severe pain in your abdomen. You feel dizzy or you faint. Summary Constipation is when a person has fewer than three bowel movements in a week, has difficulty having a bowel movement, or has stools (feces) that are dry, hard, or larger than normal. Eat foods that have a lot of fiber, such as beans, whole grains, and fresh fruits and vegetables. Drink enough fluid to keep your urine pale yellow. Take over-the-counter and prescription medicines only as told by your health care provider. This includes any fiber supplements. This information is not intended to replace advice given to you by your health care provider. Make sure you discuss any questions you have with your health care provider. Document Revised: 09/27/2022 Document Reviewed: 09/27/2022 Elsevier Patient Education  2024 [employer].

## 2024-05-08 NOTE — Progress Notes (Signed)
 C/O stomach issues

## 2024-05-08 NOTE — Progress Notes (Signed)
 Established Patient Office Visit  Subjective   Patient ID: Stephanie Chandler, female    DOB: 06/28/1966  Age: 58 y.o. MRN: [id number]  Chief Complaint  Patient presents with   Constipation    Started after traveling last week had to use enema this week restarted fiber con tabs    57yo african [place] female established patient here to discuss constipation.  She has tried coffee two days in a row but didn't have stool.  Did enema earlier this week to have stool after traveling last week.  Realized that she had stopped her magnesium supplement and wondering if that contributed along with her ozempic decreased po intake/appetite contributing.  I was told the valve between my stomach not opening well either so the medication on top of the stomach problem not helping.  Denied bloating.  Has noticed increased burping.  Yesterday ate chicken pot pie at lunch and coffee and apple for snack.  Today only had coffee so far.  Patient stated urine yellow in toilet.  Had decreased water  intake recently had switched from water  bottles to a cup and notices she drinks less when she doesn't have 4 water  bottles sitting in front of her 16oz.  Drank 32 oz water  yesterday.  Denied fever/chills/n/v/d.  Remembered when she used to be on metformin would have looser stools.  Has scheduled appt to discuss changing ozempic dosing with PCM but first available appt was July.  Not really having any stool urges yesterday or today to evacuate.      Review of Systems  Constitutional:  Negative for chills and fever.  Gastrointestinal:  Positive for constipation. Negative for abdominal pain, blood in stool, diarrhea, heartburn, nausea and vomiting.  Neurological:  Negative for weakness and headaches.  Psychiatric/Behavioral:  The patient does not have insomnia.       Objective:     BP 124/80 (BP Location: Left Arm, Patient Position: Sitting, Cuff Size: Normal)   Pulse 70   Temp 98 F (36.7 C) (Tympanic)   SpO2 100%     Physical Exam Vitals and nursing note reviewed.  Constitutional:      General: She is awake. She is not in acute distress.    Appearance: Normal appearance. She is well-developed, well-groomed and overweight. She is not ill-appearing, toxic-appearing or diaphoretic.  HENT:     Head: Normocephalic and atraumatic.     Jaw: There is normal jaw occlusion.     Salivary Glands: Right salivary gland is not diffusely enlarged. Left salivary gland is not diffusely enlarged.     Right Ear: Hearing and external ear normal. No decreased hearing noted.     Left Ear: Hearing and external ear normal. No decreased hearing noted.     Nose: Nose normal. No congestion or rhinorrhea.     Mouth/Throat:     Lips: Pink. No lesions.     Mouth: Mucous membranes are moist. No oral lesions or angioedema.     Dentition: No gum lesions.     Tongue: No lesions. Tongue does not deviate from midline.     Palate: No mass and lesions.     Pharynx: Oropharynx is clear. Uvula midline. No oropharyngeal exudate or uvula swelling.     Tonsils: No tonsillar exudate.   Eyes:     General: Lids are normal. Vision grossly intact. Gaze aligned appropriately. Allergic shiner present. No scleral icterus.       Right eye: No discharge.        Left eye:  No discharge.     Extraocular Movements: Extraocular movements intact.     Conjunctiva/sclera: Conjunctivae normal.     Pupils: Pupils are equal, round, and reactive to light.   Neck:     Trachea: Trachea and phonation normal.   Cardiovascular:     Rate and Rhythm: Normal rate and regular rhythm.     Pulses:          Radial pulses are 2+ on the right side and 2+ on the left side.     Heart sounds: Normal heart sounds, S1 normal and S2 normal.  Pulmonary:     Effort: Pulmonary effort is normal.     Breath sounds: Normal breath sounds and air entry. No stridor or transmitted upper airway sounds. No decreased breath sounds, wheezing, rhonchi or rales.     Comments: Spoke  full sentences without difficulty; no cough observed in exam room Abdominal:     General: Abdomen is flat. Bowel sounds are decreased. There is no distension or abdominal bruit.     Palpations: Abdomen is soft. There is no shifting dullness, fluid wave, hepatomegaly, splenomegaly, mass or pulsatile mass.     Tenderness: There is abdominal tenderness in the left upper quadrant and left lower quadrant. There is no guarding or rebound. Negative signs include Murphy's sign.     Hernia: No hernia is present. There is no hernia in the umbilical area or ventral area.     Comments: Dull to percussion x 4 quads; hypoactive bowel sounds x 4 quads; mildly TTP LUQ/LLQ; on/off exam table without difficulty standing to sitting to supine and reversed   Musculoskeletal:        General: Normal range of motion.     Right hand: Normal strength. Normal capillary refill.     Left hand: Normal strength. Normal capillary refill.     Cervical back: Normal range of motion and neck supple. No swelling, edema, deformity, erythema, signs of trauma, lacerations, rigidity, torticollis or crepitus. No pain with movement. Normal range of motion.     Thoracic back: No swelling, edema, deformity, signs of trauma, lacerations, spasms or tenderness. Normal range of motion.  Lymphadenopathy:     Head:     Right side of head: No submandibular or preauricular adenopathy.     Left side of head: No submandibular or preauricular adenopathy.     Cervical:     Right cervical: No superficial, deep or posterior cervical adenopathy.    Left cervical: No superficial, deep or posterior cervical adenopathy.   Skin:    General: Skin is warm and dry.     Capillary Refill: Capillary refill takes less than 2 seconds.     Coloration: Skin is not ashen, cyanotic, jaundiced, mottled, pale or sallow.     Findings: No abrasion, abscess, acne, bruising, burn, ecchymosis, erythema, signs of injury, laceration, lesion, petechiae, rash or wound.      Comments: Anterior face/neck/hands visually inspected   Neurological:     General: No focal deficit present.     Mental Status: She is alert and oriented to person, place, and time. Mental status is at baseline.     GCS: GCS eye subscore is 4. GCS verbal subscore is 5. GCS motor subscore is 6.     Cranial Nerves: Cranial nerves 2-12 are intact. No cranial nerve deficit, dysarthria or facial asymmetry.     Sensory: Sensation is intact.     Motor: Motor function is intact. No weakness, tremor, atrophy, abnormal muscle tone  or seizure activity.     Coordination: Coordination is intact. Coordination normal.     Gait: Gait is intact. Gait normal.     Comments: In/out of chair and on/off exam table without difficulty; gait sure and steady in clinic; bilateral hand grasp equal 5/5  Psychiatric:        Attention and Perception: Attention and perception normal.        Mood and Affect: Mood and affect normal.        Speech: Speech normal.        Behavior: Behavior normal. Behavior is cooperative.        Thought Content: Thought content normal.        Cognition and Memory: Cognition and memory normal.        Judgment: Judgment normal.      No results found for any visits on 05/08/24.    The 10-year ASCVD risk score (Arnett DK, et al., 2019) is: 8.9%    Assessment & Plan:   Problem List Items Addressed This Visit       Other   Constipation - Primary  Constipation drug induced Discussed LUQ/LLQ tenderness with palpation common with constipations.  May use dulcolax OTC per manufacturer instructions 1-2 days if no relief with magnesium.  Try to eat 3 small meals per day to help with stool movement through intestines versus only 1 larger meal per day.  Patient reported urine today more yellow than her typical.  Discussed most likely related to travel dehydration; ozempic decreased appetite/intake and delaying stools when traveling.  Had also stopped magnesium supplement going to restart what  she was taking previously and has follow up appt scheduled with PCM to discuss decreasing ozempic dose and maybe restarting metformin again to help control diabetes if ozempic dose decreased.  Reported she never had constipation with metformin but sometimes diarrhea so maybe they will balance each other out.  Discussed if she starts having diarrhea may need to decrease magnesium to every other day or couple of days.  Discussed I recommend to take in evening when she is at home as magnesium is also beneficial to sleep Discussed with patient: High-fiber diet Eat more high-fiber foods, which will help prevent constipation. The best sources of fiber are whole-grain cereals, such as shredded wheat or cereals with bran. Fresh fruit and raw or cooked vegetables, especially asparagus, cabbage, carrots, corn, and broccoli are other good sources of fiber.  Fluids Drink plenty of water . This helps to soften bowel movements so they are easier to pass. Watch urine color to keep pale yellow and clear in toilet reminded her toilet water  is diluted urine also Exercise. Discussed yoga stretches to help move bowels. E.g. sun salutations/twists/cobra Discussed coffee can be a stool stimulant.  Discussed okay to use OTC laxative if rare use e.g. less than monthly e.g. quarterly prn use.  Consider miralax powder 1 scoop in 8oz water  daily OTC or ensure drinking 8 oz fluids with fiber con tablets also per manufacturer instructions. Avoid delaying stool evacuation when having urge as continued delaying will contribute to constipation. Try to space out meals/snacks as now typically only eating lunch  meal and apple or coffee as other snack/meal and typically not going to bathroom at work/delays increase fruits/fibers and whole grains in diet. Attempt diet modification. Patient given [facility] handout on constipation/magnesium. Patient agreed with plan of care verbalized understanding of information/instructions and had no further  questions at this time.  Return if symptoms worsen or fail to  improve. Keep scheduled appt with PCM for constipation re-evaluation   Richardine Chancy, NP

## 2024-05-22 ENCOUNTER — Ambulatory Visit: Payer: Self-pay | Admitting: Student

## 2024-05-22 ENCOUNTER — Encounter: Payer: Self-pay | Admitting: Student

## 2024-05-22 DIAGNOSIS — M25562 Pain in left knee: Secondary | ICD-10-CM

## 2024-05-22 NOTE — Progress Notes (Signed)
   Acute Office Visit  Subjective:     Patient ID: Stephanie Chandler, female    DOB: Mar 30, 1966, 58 y.o.   MRN: [id number]  No chief complaint on file.   HPI Patient is in today for evaluation of left knee pain.  Patient states history of knee replacement 2 years ago.  Denies any specific trauma or injury to correlate with current complaints.  Complains of pain at the lateral joint line.  She has been wearing compression brace and presents to office for consideration of alternative brace.  She does have an orthopedic knee brace at home. No conservative management has been attempted.   ROS      Objective:    There were no vitals taken for this visit.   Physical Exam Constitutional:      General: She is not in acute distress.    Appearance: Normal appearance.  HENT:     Head: Normocephalic.   Musculoskeletal:     Left knee: Swelling present. No effusion, erythema, ecchymosis or lacerations. Normal range of motion. Tenderness present over the lateral joint line.   Neurological:     Mental Status: She is alert.   Weightbearing without issue  No results found for any visits on 05/22/24.      Assessment & Plan:   Problem List Items Addressed This Visit   None 58 year old female with history of left knee replacement 3 years ago presents for consideration of alternative knee brace.  She does have an orthopedic knee brace at home but was wearing a compression brace today and complains of increasing pain at the lateral joint line area.  She does have some swelling with tenderness at the lateral joint line.  Full range of motion and strength intact.  No obvious signs of effusion or infection.  Alternative brace provided today but it is a little big on her.  Discussed starting NSAID but states that she has some current GI issues and would like to stay away from the NSAIDs.  Discussed option of Voltaren  gel and patient has tried this before and tolerated it well.  Will present back to  clinic if symptoms do not improve.  If your  No orders of the defined types were placed in this encounter.   No follow-ups on file.  Alan Showers, PA-C

## 2024-05-29 ENCOUNTER — Encounter: Payer: Self-pay | Admitting: Nurse Practitioner

## 2024-05-29 ENCOUNTER — Ambulatory Visit: Payer: Self-pay | Admitting: Nurse Practitioner

## 2024-05-29 VITALS — BP 120/78 | HR 83 | Temp 98.4°F

## 2024-05-29 DIAGNOSIS — B3731 Acute candidiasis of vulva and vagina: Secondary | ICD-10-CM

## 2024-05-29 DIAGNOSIS — R21 Rash and other nonspecific skin eruption: Secondary | ICD-10-CM

## 2024-05-29 MED ORDER — FLUCONAZOLE 150 MG PO TABS: 150.0000 mg | ORAL_TABLET | Freq: Once | ORAL | 0 refills | Status: AC

## 2024-05-29 NOTE — Progress Notes (Signed)
 Subjective:     Patient ID: Stephanie Chandler, female   DOB: 1966-04-24, 58 y.o.   MRN: [id number]  HPI: 58 yo females presents with concerns for rash. Describes rash as small bumps that are itchy and burning. Rash is on her BL arms, chest, upper back and face. Bumps are skin colored except on her chest where they were red at onset. Onset over the weekend (today is Thursday). Her PCP had started Jardiance 25 mg daily 3 days prior to rash onset. Her last dose of Jardiance was Saturday. Rash started to to improve yesterday (Wednesday). Continues with few itchy bumps on BL arms and some burning on her face.  Denies new soaps, lotions, detergents, new exposures. She contacted her PCP this week who advised she discontinue Jardiance. She has been taking Benadryl  to good effect. Last dose yesterday. She is not currently on any medications for diabetes. Last Ha1c with PCP was last week, patient reports it was 5.8. At this visit, they stopped Ozempic due to GI side effects and started the [place]. She has upcoming appointment with PCP at the end of this month. She has tried multiple medications for her DM with her PCP and would like to consult with her for any new medications. Her BS this AM fasting was 107. This is a typical reading for her.  She also has concerns today for burning with urination. Onset a few days ago, about when the rash started. Denies discharge. She has had yeast infections in the past and it feels the same. 150 mg diflucan  x2 works for her. States topical medications do not work for her.   Review of Systems  Constitutional:  Negative for fever.  Genitourinary:  Positive for dysuria. Negative for difficulty urinating, frequency, hematuria, urgency and vaginal discharge.  Skin:  Positive for rash.       Objective:   Physical Exam Constitutional:      General: She is not in acute distress.    Appearance: Normal appearance.  HENT:     Head: Normocephalic and atraumatic.  Cardiovascular:      Rate and Rhythm: Normal rate and regular rhythm.     Heart sounds: Normal heart sounds.  Pulmonary:     Effort: Pulmonary effort is normal.     Breath sounds: Normal breath sounds.  Skin:    General: Skin is warm and dry.     Comments: Few scattered skin colored papules on BL arms. About 0.2 mm in diameter. Chest, back and face clear.   Neurological:     General: No focal deficit present.     Mental Status: She is alert and oriented to person, place, and time.  Psychiatric:        Mood and Affect: Mood normal.        Behavior: Behavior normal.        Thought Content: Thought content normal.        Judgment: Judgment normal.   GYN: Deferred per policy, patient     Assessment:     1. Vaginal yeast infection  2. Rash and nonspecific skin eruption    Plan:   1. Vaginal yeast infection  Symptoms consistent with prior infections. She is new to [place] and suspect this is the cause. Treat with 150 mg fluconazole , may repeat dose in 3 days as needed. She has taken to good effect in the past. Recheck for worsening symptoms, no improvement over the next 3-5 days, or any other concern.  2. Rash and nonspecific  skin eruption  Reassurance that rash appears to be improving. Continue 25-50 mg Benadryl  at bedtime as needed for itchiness. Recommended restarting her 10 mg cetirizine  every morning during symptoms. Continue off Jardiance. Discussed options for DM medications. She would like to speak to her PCP. Recommended fasting BS and 1-2 PP BS daily. Keep log to show us  in clinic and/or PCP. Notify PCP if BS starts to increase prior to upcoming appointment. Verbalizes understanding and agreement.

## 2024-07-22 ENCOUNTER — Encounter: Payer: Self-pay | Admitting: Student

## 2024-07-22 ENCOUNTER — Ambulatory Visit: Payer: Self-pay | Admitting: Student

## 2024-07-22 VITALS — BP 128/90 | HR 75 | Temp 96.1°F

## 2024-07-22 NOTE — Progress Notes (Signed)
   Subjective:    Patient ID: Stephanie Chandler, female    DOB: 19-Oct-1966, 58 y.o.   MRN: [id number]  HPI  58 year old female presents with sinus complaints.  Patient states symptoms started 2 days ago.  Complains of fullness and pain in the ears and sinus pressure.  Denies any chest congestion, GI complaints, fever, body aches.  She does have a history of allergies but presents to make sure she is not positive for COVID.    Review of Systems     Objective:   Physical Exam Constitutional:      General: She is not in acute distress.    Appearance: Normal appearance. She is not ill-appearing, toxic-appearing or diaphoretic.  HENT:     Head: Normocephalic.     Right Ear: Ear canal normal.     Left Ear: Ear canal normal.     Ears:     Comments: Fluid in ear bilaterally     Nose: Nose normal. No congestion or rhinorrhea.     Mouth/Throat:     Mouth: Mucous membranes are moist.     Pharynx: Oropharynx is clear. No oropharyngeal exudate or posterior oropharyngeal erythema.  Eyes:     Conjunctiva/sclera: Conjunctivae normal.  Cardiovascular:     Rate and Rhythm: Normal rate and regular rhythm.     Heart sounds: Normal heart sounds.  Pulmonary:     Effort: Pulmonary effort is normal.     Breath sounds: Normal breath sounds.  Abdominal:     Palpations: Abdomen is soft.  Neurological:     Mental Status: She is alert.           Assessment & Plan:   Impression: Sinus congestion Plan: Start allergy medication (Claritin , Flonase ) Discussed physical exam findings with patient.  Presents with 2 days of sinus congestion and sensation of fullness in the ears and pain.  She does have a history of seasonal allergies and sinus infections.  A coworker recently tested positive for COVID.  She does normally take Claritin  and Flonase  but had some been able to pick these up yet.  COVID test provided today - negative.

## 2024-07-22 NOTE — Progress Notes (Signed)
 Presents to employee clinic with complaint of nasal congestions, sinus pressure, left ear fullness for 2 days. Not taking over the counter medications to assist with complaints. Reports that upper teeth ache as well.  Denies known fever n/v/d. Had chills yesterday.  Report to APP for evaluation

## 2024-08-05 ENCOUNTER — Other Ambulatory Visit: Payer: Self-pay

## 2024-08-05 ENCOUNTER — Ambulatory Visit: Payer: Self-pay | Admitting: Registered Nurse

## 2024-08-05 VITALS — BP 146/87 | HR 106 | Temp 97.9°F | Resp 18

## 2024-08-05 DIAGNOSIS — A084 Viral intestinal infection, unspecified: Secondary | ICD-10-CM

## 2024-08-05 DIAGNOSIS — R000 Tachycardia, unspecified: Secondary | ICD-10-CM

## 2024-08-05 MED ORDER — COVID-19 AT HOME ANTIGEN TEST VI KIT
PACK | Freq: Every day | 1 refills | Status: AC | PRN
  Filled 2024-08-05: qty 4, fill #0
  Filled 2024-08-05: qty 4, 30d supply, fill #0

## 2024-08-05 MED ORDER — ONDANSETRON 4 MG PO TBDP: 4.0000 mg | ORAL_TABLET | Freq: Three times a day (TID) | ORAL | 0 refills | Status: AC | PRN

## 2024-08-05 NOTE — Patient Instructions (Signed)
 Viral Gastroenteritis, Adult  Viral gastroenteritis is also known as the stomach flu. This condition may affect your stomach, small intestine, and large intestine. It can cause sudden watery diarrhea, fever, and vomiting. This condition is caused by many different viruses. These viruses can be passed from person to person very easily (are contagious). Diarrhea and vomiting can make you feel weak and cause you to become dehydrated. You may not be able to keep fluids down. Dehydration can make you tired and thirsty, cause you to have a dry mouth, and decrease how often you urinate. It is important to replace the fluids that you lose from diarrhea and vomiting. What are the causes? Gastroenteritis is caused by many viruses, including rotavirus and norovirus. Norovirus is the most common cause in adults. You can get sick after being exposed to the viruses from other people. You can also get sick by: Eating food, drinking water , or touching a surface contaminated with one of these viruses. Sharing utensils or other personal items with an infected person. What increases the risk? You are more likely to develop this condition if you: Have a weak body defense system (immune system). Live with one or more children who are younger than 2 years. Live in a nursing home. Travel on cruise ships. What are the signs or symptoms? Symptoms of this condition start suddenly 1-3 days after exposure to a virus. Symptoms may last for a few days or for as long as a week. Common symptoms include watery diarrhea and vomiting. Other symptoms include: Fever. Headache. Fatigue. Pain in the abdomen. Chills. Weakness. Nausea. Muscle aches. Loss of appetite. How is this diagnosed? This condition is diagnosed with a medical history and physical exam. You may also have a stool test to check for viruses or other infections. How is this treated? This condition typically goes away on its own. The focus of treatment is to  prevent dehydration and restore lost fluids (rehydration). This condition may be treated with: An oral rehydration solution (ORS) to replace important salts and minerals (electrolytes) in your body. Take this if told by your health care provider. This is a drink that is sold at pharmacies and retail stores. Medicines to help with your symptoms. Probiotic supplements to reduce symptoms of diarrhea. Fluids given through an IV, if dehydration is severe. Older adults and people with other diseases or a weak immune system are at higher risk for dehydration. Follow these instructions at home: Eating and drinking  Take an ORS as told by your health care provider. Drink clear fluids in small amounts as you are able. Clear fluids include: Water . Ice chips. Diluted fruit juice. Low-calorie sports drinks. Drink enough fluid to keep your urine pale yellow. Eat small amounts of healthy foods every 3-4 hours as you are able. This may include whole grains, fruits, vegetables, lean meats, and yogurt. Avoid fluids that contain a lot of sugar or caffeine, such as energy drinks, sports drinks, and soda. Avoid spicy or fatty foods. Avoid alcohol. General instructions  Wash your hands often, especially after having diarrhea or vomiting. If soap and water  are not available, use hand sanitizer. Make sure that all people in your household wash their hands well and often. Take over-the-counter and prescription medicines only as told by your health care provider. Rest at home while you recover. Watch your condition for any changes. Take a warm bath to relieve any burning or pain from frequent diarrhea episodes. Keep all follow-up visits. This is important. Contact a health care  provider if you: Cannot keep fluids down. Have symptoms that get worse. Have new symptoms. Feel light-headed or dizzy. Have muscle cramps. Get help right away if you: Have chest pain. Have trouble breathing or you are breathing  very quickly. Have a fast heartbeat. Feel extremely weak or you faint. Have a severe headache, a stiff neck, or both. Have a rash. Have severe pain, cramping, or bloating in your abdomen. Have skin that feels cold and clammy. Feel confused. Have pain when you urinate. Have signs of dehydration, such as: Dark urine, very little urine, or no urine. Cracked lips. Dry mouth. Sunken eyes. Sleepiness. Weakness. Have signs of bleeding, such as: Seeing blood in your vomit. Having vomit that looks like coffee grounds. Having bloody or black stools or stools that look like tar. These symptoms may be an emergency. Get help right away. Call 911. Do not wait to see if the symptoms will go away. Do not drive yourself to the hospital. Summary Viral gastroenteritis is also known as the stomach flu. It can cause sudden watery diarrhea, fever, and vomiting. This condition can be passed from person to person very easily (is contagious). Take an oral rehydration solution (ORS) if told by your health care provider. This is a drink that is sold at pharmacies and retail stores. Wash your hands often, especially after having diarrhea or vomiting. If soap and water  are not available, use hand sanitizer. This information is not intended to replace advice given to you by your health care provider. Make sure you discuss any questions you have with your health care provider. Document Revised: 09/12/2021 Document Reviewed: 09/12/2021 Elsevier Patient Education  2024 [employer]Sinus Tachycardia  Sinus tachycardia is a fast heartbeat. In sinus tachycardia, the heart beats more than 100 times a minute. Sinus tachycardia starts in the part of the heart called the sinoatrial (SA) node. Sinus tachycardia may be harmless, or it may be a sign of a serious condition. What are the causes? This condition may be caused by: Exercise or exertion. A fever. Pain. Loss of body fluids (dehydration). Severe bleeding  (hemorrhage). Anxiety and stress. Certain substances, including: Alcohol. Caffeine. Tobacco and nicotine  products. Cold medicines. Illegal drugs. Medical conditions including: Heart disease. An infection. An overactive thyroid (hyperthyroidism). A lack of red blood cells (anemia). What are the signs or symptoms? Symptoms of this condition include: A feeling that the heart is beating fast or unevenly (palpitations). Suddenly noticing your heartbeat (cardiac awareness). Lightheadedness. Tiredness (fatigue). Shortness of breath. Chest pain. Nausea. Fainting. How is this diagnosed? This condition is diagnosed with: A physical exam. Tests or monitoring, such as: Blood tests. An electrocardiogram (ECG). This test measures the electrical activity of the heart. Ambulatory cardiac monitor. This records your heartbeats for 24 hours or more. You may be referred to a heart specialist (cardiologist). How is this treated? Treatment for this condition depends on the cause. Treatment may involve: Treating the underlying condition. Taking new medicines or changing your current medicines as told by your health care provider. Making changes to your diet or lifestyle. Follow these instructions at home: Lifestyle  Do not use any products that contain nicotine  or tobacco. These products include cigarettes, chewing tobacco, and vaping devices, such as e-cigarettes. If you need help quitting, ask your health care provider. Do not use illegal drugs, such as cocaine. Learn relaxation methods to help you when you get stressed or anxious. These include deep breathing. Avoid caffeine or other stimulants, including herbal stimulants that are found in  energy drinks. Alcohol use  Do not drink alcohol if: Your health care provider tells you not to drink. You are pregnant, may be pregnant, or are planning to become pregnant. If you drink alcohol: Limit how much you have to: 0-1 drink a day for  women. 0-2 drinks a day for men. Know how much alcohol is in your drink. In the [place] one drink equals one 12 oz bottle of beer (355 mL), one 5 oz glass of wine (148 mL), or one 1 oz glass of hard liquor (44 mL). General instructions Drink enough fluids to keep your urine pale yellow. Take over-the-counter and prescription medicines only as told by your health care provider. Ask your health care provider about taking vitamins, herbs, and supplements. Contact a health care provider if: You have vomiting or diarrhea that does not go away. You have a fever. You have weakness or dizziness. You feel faint. Get help right away if: You have pain in your chest, upper arms, jaw, or neck. You have palpitations that do not go away. Summary In sinus tachycardia, the heart beats more than 100 times a minute. Sinus tachycardia may be harmless, or it may be a sign of a serious condition. Treatment for this condition depends on the cause or the underlying condition. Get help right away if you have pain in your chest, upper arms, jaw, or neck. This information is not intended to replace advice given to you by your health care provider. Make sure you discuss any questions you have with your health care provider. Document Revised: 03/14/2022 Document Reviewed: 03/14/2022 Elsevier Patient Education  2024 [employer] Inc.Food Choices to Help Relieve Diarrhea, Adult Diarrhea can make you feel weak and cause you to become dehydrated. Dehydration is a condition in which there is not enough water  or other fluids in the body. It is important to choose the right foods and drinks to: Relieve diarrhea. Replace lost fluids and nutrients. Prevent dehydration. What are tips for following this plan? Relieving diarrhea Avoid foods that make your diarrhea worse. These may include: Foods and drinks that are sweetened with high-fructose corn syrup, honey, or sweeteners such as xylitol, sorbitol, and mannitol. Check food  labels for these ingredients. Fried, greasy, or spicy foods. Raw fruits and vegetables. Eat foods that are rich in probiotics. These include foods such as yogurt and fermented milk products. Probiotics can help increase healthy bacteria in your stomach and intestines (gastrointestinal or GI tract). This may help digestion and stop diarrhea. If you have lactose intolerance, avoid dairy products. These may make your diarrhea worse. Take medicine to help stop diarrhea only as told by your health care provider. Replacing nutrients  Eat bland, easy-to-digest foods in small amounts as you are able, until your diarrhea starts to get better. These foods include bananas, applesauce, rice, toast, and crackers. Over time, add nutrient-rich foods as your body tolerates them or as told by your health care provider. These include: Well-cooked protein foods, such as eggs, lean meats like fish or chicken without skin, and tofu. Peeled, seeded, and soft-cooked fruits and vegetables. Low-fat dairy products. Whole grains. Take vitamin and mineral supplements as told by your health care provider. Preventing dehydration  Start by sipping water  or a solution to prevent dehydration (oral rehydration solution, or ORS). This is a drink that helps replace fluids and minerals your body has lost. You can buy an ORS at pharmacies and retail stores. Try to drink at least 8-10 cups (2,000-2,500 mL) of fluid each day  to help replace lost fluids. If your urine is pale yellow, you are getting enough fluids. You may drink other liquids in addition to water , such as fruit juice that you have added water  to (diluted fruit juice) or low-calorie sports drinks, as tolerated or as told by your health care provider. Avoid drinks with caffeine, such as coffee, tea, or soft drinks. Avoid alcohol. This information is not intended to replace advice given to you by your health care provider. Make sure you discuss any questions you have with  your health care provider. Document Revised: 05/02/2022 Document Reviewed: 05/02/2022 Elsevier Patient Education  2024 [employer].

## 2024-08-06 ENCOUNTER — Ambulatory Visit: Payer: Self-pay | Admitting: Registered Nurse

## 2024-08-06 ENCOUNTER — Other Ambulatory Visit: Payer: Self-pay

## 2024-08-06 ENCOUNTER — Encounter: Payer: Self-pay | Admitting: Registered Nurse

## 2024-08-06 DIAGNOSIS — A084 Viral intestinal infection, unspecified: Secondary | ICD-10-CM

## 2024-08-06 LAB — POC COVID19 BINAXNOW: SARS Coronavirus 2 Ag: NEGATIVE

## 2024-08-06 NOTE — Progress Notes (Signed)
 Established Patient Office Visit  Subjective   Patient ID: Stephanie Chandler, female    DOB: [birth year]  Age: 58 y.o. MRN: [id number]  Chief Complaint  Patient presents with   vomiting and diarrhea started suddenly at work today    58y/o african [place] female established patient here for evaluation vomiting and diarrhea started suddenly at work today felt hot then threw up.  Has thrown up 3 times and diarrhea 2 times today all at work.  Denied fever/chills/sick contacts.  Blood in urine/stool/emesis.  Abdomen slight pain noted.  Denied back pain/dyspnea/changes in vision/shortness of breath/new rhinitis/nasal congestion.  Ate normal breakfast this am.  Has been taking her prescription medications as prescribed.  Noticed a couple weeks ago heart racing but has not reoccurred      Review of Systems  Constitutional:  Negative for chills, fever and malaise/fatigue.  HENT:  Negative for congestion, ear discharge, ear pain and sinus pain.   Eyes:  Negative for blurred vision, double vision, photophobia, pain, discharge and redness.  Respiratory:  Negative for cough, hemoptysis, sputum production, shortness of breath, wheezing and stridor.   Cardiovascular:  Positive for palpitations. Negative for chest pain and leg swelling.  Gastrointestinal:  Positive for abdominal pain, diarrhea, nausea and vomiting. Negative for blood in stool, constipation, heartburn and melena.  Genitourinary:  Negative for dysuria.  Skin:  Negative for itching and rash.  Neurological:  Negative for dizziness, tingling, tremors, sensory change, speech change, focal weakness, seizures, loss of consciousness and weakness.  Endo/Heme/Allergies:  Does not bruise/bleed easily.  Psychiatric/Behavioral:  The patient does not have insomnia.       Objective:     BP (!) 146/87 (BP Location: Right Arm, Patient Position: Sitting, Cuff Size: Normal)   Pulse (!) 106   Temp 97.9 F (36.6 C) (Tympanic)   Resp 18   SpO2 99%     Physical Exam Vitals reviewed.  Constitutional:      General: She is awake. She is not in acute distress.    Appearance: Normal appearance. She is well-developed, well-groomed and overweight. She is ill-appearing. She is not toxic-appearing or diaphoretic.  HENT:     Head: Normocephalic and atraumatic. No right periorbital erythema or left periorbital erythema.     Jaw: There is normal jaw occlusion.     Salivary Glands: Right salivary gland is not diffusely enlarged or tender. Left salivary gland is not diffusely enlarged or tender.     Right Ear: Hearing and external ear normal. No decreased hearing noted. No laceration, drainage or swelling.     Left Ear: Hearing and external ear normal. No decreased hearing noted. No laceration, drainage or swelling.     Nose: Nose normal. No congestion or rhinorrhea.     Right Nostril: No epistaxis.     Left Nostril: No epistaxis.     Right Turbinates: Not enlarged, swollen or pale.     Left Turbinates: Not enlarged, swollen or pale.     Right Sinus: No maxillary sinus tenderness or frontal sinus tenderness.     Left Sinus: No maxillary sinus tenderness or frontal sinus tenderness.     Mouth/Throat:     Lips: Pink. No lesions.     Mouth: Mucous membranes are moist. No oral lesions or angioedema.     Dentition: No gum lesions.     Tongue: No lesions. Tongue does not deviate from midline.     Palate: No mass and lesions.     Pharynx: Oropharynx is  clear. Uvula midline. No oropharyngeal exudate, posterior oropharyngeal erythema or uvula swelling.     Tonsils: No tonsillar exudate.  Eyes:     General: Lids are normal. Vision grossly intact. Gaze aligned appropriately. No allergic shiner or scleral icterus.       Right eye: No discharge.        Left eye: No discharge.     Extraocular Movements: Extraocular movements intact.     Conjunctiva/sclera: Conjunctivae normal.     Pupils: Pupils are equal, round, and reactive to light.  Neck:      Trachea: Trachea and phonation normal.  Cardiovascular:     Rate and Rhythm: Regular rhythm. Tachycardia present.     Pulses: Normal pulses.          Radial pulses are 2+ on the right side and 2+ on the left side.     Heart sounds: Normal heart sounds, S1 normal and S2 normal.  Pulmonary:     Effort: Pulmonary effort is normal. No respiratory distress.     Breath sounds: Normal breath sounds and air entry. No stridor or transmitted upper airway sounds. No decreased breath sounds, wheezing, rhonchi or rales.     Comments: Spoke full sentences without difficulty; no cough observed in exam room; BBS CTA Abdominal:     General: Abdomen is flat. Bowel sounds are increased. There is no distension or abdominal bruit. There are no signs of injury.     Palpations: Abdomen is soft. There is no shifting dullness, fluid wave, hepatomegaly, splenomegaly, mass or pulsatile mass.     Tenderness: There is abdominal tenderness in the epigastric area. There is no right CVA tenderness, left CVA tenderness, guarding or rebound. Negative signs include Murphy's sign.     Comments: Hyperactive bowel sounds x 4 quads; mildly TTP epigastric; dull to percussion x 4 quads; in/out of chair without difficulty; emesis x 1 in exam room and patient had to go to bathroom diarrhea x 1 while in clinic  Musculoskeletal:        General: No swelling, tenderness or signs of injury. Normal range of motion.     Right hand: Normal strength. Normal capillary refill.     Left hand: Normal strength. Normal capillary refill.     Cervical back: Normal range of motion and neck supple. No swelling, edema, deformity, erythema, signs of trauma, lacerations, rigidity, torticollis, tenderness or crepitus. No pain with movement or muscular tenderness. Normal range of motion.     Thoracic back: No swelling, edema, deformity, signs of trauma, lacerations, spasms or tenderness. Normal range of motion.     Right lower leg: No edema.     Left lower  leg: No edema.  Lymphadenopathy:     Head:     Right side of head: No submental, submandibular, tonsillar, preauricular, posterior auricular or occipital adenopathy.     Left side of head: No submental, submandibular, tonsillar, preauricular, posterior auricular or occipital adenopathy.     Cervical: No cervical adenopathy.     Right cervical: No superficial, deep or posterior cervical adenopathy.    Left cervical: No superficial, deep or posterior cervical adenopathy.  Skin:    General: Skin is warm and dry.     Capillary Refill: Capillary refill takes less than 2 seconds.     Coloration: Skin is not ashen, cyanotic, jaundiced, mottled, pale or sallow.     Findings: No abrasion, abscess, acne, bruising, burn, ecchymosis, erythema, signs of injury, laceration, lesion, petechiae, rash or wound.  Nails: There is no clubbing.     Comments: Anterior face/neck/hands visually inspected  Neurological:     General: No focal deficit present.     Mental Status: She is alert and oriented to person, place, and time. Mental status is at baseline.     GCS: GCS eye subscore is 4. GCS verbal subscore is 5. GCS motor subscore is 6.     Cranial Nerves: No cranial nerve deficit, dysarthria or facial asymmetry.     Sensory: No sensory deficit.     Motor: Motor function is intact. No weakness, tremor, atrophy, abnormal muscle tone or seizure activity.     Coordination: Coordination is intact. Coordination normal.     Gait: Gait is intact. Gait normal.     Comments: In/out of chair without difficulty; gait sure and steady in clinic; bilateral hand grasp equal 5/5  Psychiatric:        Attention and Perception: Attention and perception normal.        Mood and Affect: Mood and affect normal.        Speech: Speech normal.        Behavior: Behavior normal. Behavior is cooperative.        Thought Content: Thought content normal.        Cognition and Memory: Cognition and memory normal.        Judgment:  Judgment normal.      Results for orders placed or performed in visit on 08/05/24  POC COVID-19 BinaxNow  Result Value Ref Range   SARS Coronavirus 2 Ag Negative Negative   Patient notified negative results repeat testing tomorrow if symptoms persist.  Patient agreed with plan of care and had no further questions at this time.   The 10-year ASCVD risk score (Arnett DK, et al., 2019) is: 15.2%    Assessment & Plan:   Problem List Items Addressed This Visit   None Visit Diagnoses       Viral gastroenteritis    -  Primary   Relevant Medications   fluconazole  (DIFLUCAN ) 150 MG tablet   Other Relevant Orders   POC COVID-19 BinaxNow (Completed)     Tachycardia, unspecified          POC covid testing x 1 now.  Discussed 2 new variants circulating in [place]  and increase in cases seen in clinic the past 3 weeks.  Electronic Rx to her pharmacy of choice covid tests [facility] [id number] if will not dispense with insurance card for free save receipt and see HR Tonya for reimbursement form as authorized 4 per month.  Ondansetron  ODT 4mg  sig t1-2 po q8h prn n/v #20 RF0 I have recommended clear fluids and bland diet.  Avoid dairy/spicy, fried and large portions of meat while having nausea.  If vomiting hold po intake x 1 hour.  Then sips clear fluids like broths, ginger ale, power ade, gatorade, pedialyte may advance to soft/bland if no vomiting x 24 hours and appetite returned otherwise hydration main focus.  zofran  4mg  po BID prn n/v Return to the clinic if symptoms persist or worsen; I have alerted the patient to call if high fever, dehydration, marked weakness, fainting, increased abdominal pain, blood in stool or vomit (red or black).  Same day re-evaluation with a provider if blood in vomit/stool, worsening abdomen pain, unable to pee every 8 hours/tea/cola colored urine.  Discussed [facility] clinic closed tomorrow.  Discussed this is second case of vomiting at work seen in clinic today.  Home for  48  hours and vomiting/diarrhea needs to be resolved for 24 hours prior to return to work.  HR and supervisor notified sent home per communicable disease policy today.  Will follow up via telephone tomorrow for re-evaluation. Exitcare handouts on viral gastroenteritis and foods to relieve diarrhea  Discussed with patient mildly dehydrated today heart rate elevated.  Coffee/Tea are diuretic recommended gatorade/nondairy popsicles/ginger ale/broths first line if possible or alternating tea/water .   Patient verbalized agreement and understanding of treatment plan and had no further questions at this time.    Discussed heart rate and BP elevated today most likely related to acute illness.  Re-evaluation in 2 weeks with clinic RN BP/HR recheck sooner if new or worsening symptoms. Discussed dehydration and electrolyte disturbances can occur with vomiting and diarrhea which affects functioning of muscles including heart.  Patient agreed with plan of care and had no further questions at this time.  Exitcare handout on sinus tachycardia.  EKG not available at [facility].  Return if symptoms worsen or fail to improve.    Ellouise DELENA Hope, NP

## 2024-08-07 NOTE — Telephone Encounter (Signed)
 Patient reported returned to work today and going well  GI symptoms resolved yesterday.  Slept most of day yesterday.  A&Ox3 skin warm dry and pink feeling much better respirations even and unlabored gait sure and steady

## 2024-08-12 ENCOUNTER — Encounter: Payer: Self-pay | Admitting: Registered Nurse

## 2024-08-12 ENCOUNTER — Telehealth: Payer: Self-pay | Admitting: Registered Nurse

## 2024-08-12 NOTE — Telephone Encounter (Signed)
 Patient reported all symptoms have resolved and energy level back to normal  Discussed with patient I would need to research further her request regarding generic florastor  Patient agreed with plan of care and had no further questions at this time.

## 2024-08-13 NOTE — Telephone Encounter (Signed)
 Sought clarification from patient as did not see on her med list or in recent notes from [facility] or specialists for use.  Patient reported she is using Florastor daily per her GI to supplement 10X larger Cells gut protection.  There are 4 different formulas available from manufacturer per website.  For any product I recommend ISP/USP tested (quality testing by third party)  Up to date also listed should not take medication within 2 hours of juice consumption also.  Continuing to research alternatives for patient.

## 2024-08-28 ENCOUNTER — Encounter: Payer: Self-pay | Admitting: Registered Nurse

## 2024-08-28 ENCOUNTER — Telehealth: Payer: Self-pay | Admitting: Registered Nurse

## 2024-08-28 DIAGNOSIS — E1165 Type 2 diabetes mellitus with hyperglycemia: Secondary | ICD-10-CM

## 2024-08-28 MED ORDER — GLUCOSE BLOOD VI STRP: ORAL_STRIP | 12 refills | Status: AC

## 2024-08-28 MED ORDER — ONETOUCH ULTRASOFT LANCETS MISC: 12 refills | Status: AC

## 2024-08-28 NOTE — Telephone Encounter (Signed)
 Patient needed refills for one touch glucose test strips and lancets.  Electronic rx sent to her pharmacy of choice [facility] #[id number] for each today.  Patient verbalized understanding information and had no further questions at that time.

## 2024-08-29 ENCOUNTER — Other Ambulatory Visit: Payer: Self-pay

## 2024-08-29 MED ORDER — ONETOUCH VERIO VI STRP
ORAL_STRIP | 3 refills | Status: AC
  Filled 2024-08-29 (×2): qty 50, 50d supply, fill #0
  Filled 2024-10-16: qty 50, 50d supply, fill #1

## 2024-08-29 MED ORDER — LANCETS 33G MISC
3 refills | Status: AC
  Filled 2024-08-29: qty 100, 90d supply, fill #0

## 2024-09-01 ENCOUNTER — Other Ambulatory Visit: Payer: Self-pay

## 2024-09-02 ENCOUNTER — Other Ambulatory Visit: Payer: Self-pay

## 2024-09-02 MED ORDER — ONETOUCH VERIO FLEX SYSTEM W/DEVICE KIT
PACK | 1 refills | Status: AC
  Filled 2024-09-02: qty 1, 30d supply, fill #0
  Filled 2024-09-29: qty 1, 30d supply, fill #1

## 2024-09-04 ENCOUNTER — Other Ambulatory Visit: Payer: Self-pay

## 2024-09-04 MED ORDER — SUCRALFATE 1 G PO TABS
1.0000 g | ORAL_TABLET | Freq: Two times a day (BID) | ORAL | 3 refills | Status: AC
  Filled 2024-09-04: qty 60, 30d supply, fill #0
  Filled 2024-09-29: qty 60, 30d supply, fill #1

## 2024-09-09 ENCOUNTER — Telehealth: Payer: Self-pay | Admitting: Registered Nurse

## 2024-09-09 ENCOUNTER — Encounter: Payer: Self-pay | Admitting: Registered Nurse

## 2024-09-09 NOTE — Telephone Encounter (Signed)
 Rosuvastatin and cyclobenzaprine  are available through formulary at [facility] Patient notified verbalized understanding and had no further questions at this time.

## 2024-09-10 NOTE — Telephone Encounter (Signed)
 [facility] has 500mg  florastor similar product available on shelves patient notified to monitor for sales as similar price when not on sale to what she is paying now.

## 2024-09-16 ENCOUNTER — Other Ambulatory Visit: Payer: Self-pay | Admitting: Family Medicine

## 2024-09-16 DIAGNOSIS — Z1231 Encounter for screening mammogram for malignant neoplasm of breast: Secondary | ICD-10-CM

## 2024-09-20 ENCOUNTER — Ambulatory Visit
Admission: RE | Admit: 2024-09-20 | Discharge: 2024-09-20 | Disposition: A | Discharge status: Home / Self Care | Payer: PRIVATE HEALTH INSURANCE | Source: Ambulatory Visit | Attending: Family Medicine | Admitting: Family Medicine

## 2024-09-20 DIAGNOSIS — Z1231 Encounter for screening mammogram for malignant neoplasm of breast: Secondary | ICD-10-CM

## 2024-09-26 NOTE — Telephone Encounter (Signed)
 Patient reported she is going to try [facility] product after she uses up current florastor supply.  She needs BMET and Hgba1c drawn per [facility] asked if these could be completed in clinic has letter from [facility].  Discussed yes she just needs to schedule with RN [facility] closed 09/26/24-09/29/24 but RN Karene in clinic Tues-Thur 8a-5p next week.  Patient verbalized understanding information/instructions, agreed with plan of care and had no further questions at this time.

## 2024-09-29 ENCOUNTER — Encounter: Payer: Self-pay | Admitting: Radiology

## 2024-10-01 ENCOUNTER — Other Ambulatory Visit: Payer: Self-pay

## 2024-10-01 ENCOUNTER — Other Ambulatory Visit: Payer: Self-pay | Admitting: General Practice

## 2024-10-01 ENCOUNTER — Ambulatory Visit: Payer: Self-pay | Admitting: General Practice

## 2024-10-01 DIAGNOSIS — E119 Type 2 diabetes mellitus without complications: Secondary | ICD-10-CM

## 2024-10-01 NOTE — Progress Notes (Signed)
 Employee presents to the clinic this afternoon requesting lab draws for an upcoming annual physical with Dr. Rolinda. Presented written orders for an Executive Panel and Hgb A1C.  Labs drawn and sent to [facility] this afternoon. Will follow up with results when available.

## 2024-10-02 LAB — CMP12+LP+TP+TSH+6AC+CBC/D/PLT
ALT: 23 IU/L (ref 0–32)
ALT: 23 IU/L (ref 0–32)
AST: 21 IU/L (ref 0–40)
AST: 21 IU/L (ref 0–40)
Albumin: 4 g/dL (ref 3.8–4.9)
Albumin: 4 g/dL (ref 3.8–4.9)
Alkaline Phosphatase: 88 IU/L (ref 49–135)
Alkaline Phosphatase: 88 IU/L (ref 49–135)
BUN/Creatinine Ratio: 26 — ABNORMAL HIGH (ref 9–23)
BUN/Creatinine Ratio: 26 — ABNORMAL HIGH (ref 9–23)
BUN: 21 mg/dL (ref 6–24)
BUN: 21 mg/dL (ref 6–24)
Basophils Absolute: 0.1 x10E3/uL (ref 0.0–0.2)
Basophils Absolute: 0.1 x10E3/uL (ref 0.0–0.2)
Basos: 1 %
Basos: 1 %
Bilirubin Total: 0.2 mg/dL (ref 0.0–1.2)
Bilirubin Total: 0.2 mg/dL (ref 0.0–1.2)
Calcium: 9.1 mg/dL (ref 8.7–10.2)
Calcium: 9.1 mg/dL (ref 8.7–10.2)
Chloride: 106 mmol/L (ref 96–106)
Chloride: 106 mmol/L (ref 96–106)
Chol/HDL Ratio: 2.5 ratio (ref 0.0–4.4)
Chol/HDL Ratio: 2.5 ratio (ref 0.0–4.4)
Cholesterol, Total: 157 mg/dL (ref 100–199)
Cholesterol, Total: 157 mg/dL (ref 100–199)
Creatinine, Ser: 0.8 mg/dL (ref 0.57–1.00)
Creatinine, Ser: 0.8 mg/dL (ref 0.57–1.00)
EOS (ABSOLUTE): 0.1 x10E3/uL (ref 0.0–0.4)
EOS (ABSOLUTE): 0.1 x10E3/uL (ref 0.0–0.4)
Eos: 3 %
Eos: 3 %
Estimated CHD Risk: <0.5 times avg. (ref 0.0–1.0)
Estimated CHD Risk: <0.5 times avg. (ref 0.0–1.0)
Free Thyroxine Index: 1.9 (ref 1.2–4.9)
Free Thyroxine Index: 1.9 (ref 1.2–4.9)
GGT: 30 IU/L (ref 0–60)
GGT: 30 IU/L (ref 0–60)
Globulin, Total: 2.6 g/dL (ref 1.5–4.5)
Globulin, Total: 2.6 g/dL (ref 1.5–4.5)
Glucose: 138 mg/dL — ABNORMAL HIGH (ref 70–99)
Glucose: 138 mg/dL — ABNORMAL HIGH (ref 70–99)
HDL: 64 mg/dL (ref >39)
HDL: 64 mg/dL (ref >39)
Hematocrit: 40 % (ref 34.0–46.6)
Hematocrit: 40 % (ref 34.0–46.6)
Hemoglobin: 13.2 g/dL (ref 11.1–15.9)
Hemoglobin: 13.2 g/dL (ref 11.1–15.9)
Immature Grans (Abs): 0 x10E3/uL (ref 0.0–0.1)
Immature Grans (Abs): 0 x10E3/uL (ref 0.0–0.1)
Immature Granulocytes: 0 %
Immature Granulocytes: 0 %
Iron: 106 ug/dL (ref 27–159)
Iron: 106 ug/dL (ref 27–159)
LDH: 203 IU/L (ref 119–226)
LDH: 203 IU/L (ref 119–226)
LDL Chol Calc (NIH): 81 mg/dL (ref 0–99)
LDL Chol Calc (NIH): 81 mg/dL (ref 0–99)
Lymphocytes Absolute: 1.1 x10E3/uL (ref 0.7–3.1)
Lymphocytes Absolute: 1.1 x10E3/uL (ref 0.7–3.1)
Lymphs: 24 %
Lymphs: 24 %
MCH: 31.1 pg (ref 26.6–33.0)
MCH: 31.1 pg (ref 26.6–33.0)
MCHC: 33 g/dL (ref 31.5–35.7)
MCHC: 33 g/dL (ref 31.5–35.7)
MCV: 94 fL (ref 79–97)
MCV: 94 fL (ref 79–97)
Monocytes Absolute: 0.5 x10E3/uL (ref 0.1–0.9)
Monocytes Absolute: 0.5 x10E3/uL (ref 0.1–0.9)
Monocytes: 11 %
Monocytes: 11 %
Neutrophils Absolute: 2.9 x10E3/uL (ref 1.4–7.0)
Neutrophils Absolute: 2.9 x10E3/uL (ref 1.4–7.0)
Neutrophils: 61 %
Neutrophils: 61 %
Phosphorus: 3.3 mg/dL (ref 3.0–4.3)
Phosphorus: 3.3 mg/dL (ref 3.0–4.3)
Platelets: 259 x10E3/uL (ref 150–450)
Platelets: 259 x10E3/uL (ref 150–450)
Potassium: 4.1 mmol/L (ref 3.5–5.2)
Potassium: 4.1 mmol/L (ref 3.5–5.2)
RBC: 4.24 x10E6/uL (ref 3.77–5.28)
RBC: 4.24 x10E6/uL (ref 3.77–5.28)
RDW: 12.8 % (ref 11.7–15.4)
RDW: 12.8 % (ref 11.7–15.4)
Sodium: 141 mmol/L (ref 134–144)
Sodium: 141 mmol/L (ref 134–144)
T3 Uptake Ratio: 28 % (ref 24–39)
T3 Uptake Ratio: 28 % (ref 24–39)
T4, Total: 6.7 ug/dL (ref 4.5–12.0)
T4, Total: 6.7 ug/dL (ref 4.5–12.0)
TSH: 0.506 u[IU]/mL (ref 0.450–4.500)
TSH: 0.506 u[IU]/mL (ref 0.450–4.500)
Total Protein: 6.6 g/dL (ref 6.0–8.5)
Total Protein: 6.6 g/dL (ref 6.0–8.5)
Triglycerides: 61 mg/dL (ref 0–149)
Triglycerides: 61 mg/dL (ref 0–149)
Uric Acid: 3.7 mg/dL (ref 3.0–7.2)
Uric Acid: 3.7 mg/dL (ref 3.0–7.2)
VLDL Cholesterol Cal: 12 mg/dL (ref 5–40)
VLDL Cholesterol Cal: 12 mg/dL (ref 5–40)
WBC: 4.8 x10E3/uL (ref 3.4–10.8)
WBC: 4.8 x10E3/uL (ref 3.4–10.8)
eGFR: 85 mL/min/1.73 (ref >59)
eGFR: 85 mL/min/1.73 (ref >59)

## 2024-10-02 LAB — HGB A1C W/O EAG
Hgb A1c MFr Bld: 5.8 % — ABNORMAL HIGH (ref 4.8–5.6)
Hgb A1c MFr Bld: 5.8 % — ABNORMAL HIGH (ref 4.8–5.6)

## 2024-10-07 ENCOUNTER — Other Ambulatory Visit: Payer: Self-pay

## 2024-10-08 ENCOUNTER — Other Ambulatory Visit: Payer: Self-pay

## 2024-10-10 ENCOUNTER — Other Ambulatory Visit: Payer: Self-pay

## 2024-10-10 MED ORDER — OZEMPIC (0.25 OR 0.5 MG/DOSE) 2 MG/3ML ~~LOC~~ SOPN
0.2500 mg | PEN_INJECTOR | SUBCUTANEOUS | 0 refills | Status: AC
  Filled 2024-10-10: qty 3, 56d supply, fill #0

## 2024-10-10 MED ORDER — ALBUTEROL SULFATE HFA 108 (90 BASE) MCG/ACT IN AERS
1.0000 | INHALATION_SPRAY | RESPIRATORY_TRACT | 1 refills | Status: AC | PRN
  Filled 2024-10-10: qty 20.1, 90d supply, fill #0

## 2024-10-14 ENCOUNTER — Ambulatory Visit: Admitting: General Practice

## 2024-10-14 DIAGNOSIS — E119 Type 2 diabetes mellitus without complications: Secondary | ICD-10-CM

## 2024-10-14 NOTE — Progress Notes (Unsigned)
 Employee presented to the clinic requesting latest lab results be faxed to her PCP.  RN faxed 10/01/2024 lab results of an Executive Panel and Hgb A1C to Dr. Vernell Fort at [facility], fax # [phone].

## 2024-10-15 ENCOUNTER — Other Ambulatory Visit: Payer: Self-pay

## 2024-10-16 ENCOUNTER — Other Ambulatory Visit: Payer: Self-pay

## 2024-10-17 ENCOUNTER — Other Ambulatory Visit: Payer: Self-pay

## 2024-11-12 ENCOUNTER — Ambulatory Visit: Admitting: General Practice

## 2024-11-12 DIAGNOSIS — Z23 Encounter for immunization: Secondary | ICD-10-CM

## 2024-11-18 ENCOUNTER — Telehealth: Payer: Self-pay | Admitting: Registered Nurse

## 2024-11-18 ENCOUNTER — Ambulatory Visit: Admitting: General Practice

## 2024-11-18 DIAGNOSIS — E119 Type 2 diabetes mellitus without complications: Secondary | ICD-10-CM

## 2024-11-18 NOTE — Telephone Encounter (Signed)
 Erielle requested results of lab work drawn on October 01, 2024 be faxed to her primary provider. RN printed and securely faxed results to Dr. Vernell [facility] at [phone]. Confirmation received of fax completion via machine.

## 2024-11-18 NOTE — Progress Notes (Unsigned)
 Erielle requested results of lab work drawn on October 01, 2024 be faxed to her primary provider. RN printed and securely faxed results to Dr. Vernell [facility] at [phone]. Confirmation received of fax completion via machine.

## 2024-11-25 ENCOUNTER — Other Ambulatory Visit: Payer: Self-pay

## 2024-11-25 ENCOUNTER — Telehealth: Payer: Self-pay | Admitting: Orthopedic Surgery

## 2024-11-25 MED ORDER — AMOXICILLIN 500 MG PO TABS: ORAL_TABLET | ORAL | 0 refills | Status: AC

## 2024-11-25 NOTE — Telephone Encounter (Signed)
 Pt is calling stating that she is needing an antibiotic due to having her teeth cleaned and her dentist is stating that she is needing it since she has had a knee. If we aren't able to give her the antibiotics she would like to have a call to let her know.  Pharmacy:  [facility] on [address]

## 2024-12-09 ENCOUNTER — Telehealth: Payer: Self-pay | Admitting: Registered Nurse

## 2024-12-09 ENCOUNTER — Encounter: Payer: Self-pay | Admitting: Registered Nurse

## 2024-12-09 DIAGNOSIS — E7800 Pure hypercholesterolemia, unspecified: Secondary | ICD-10-CM

## 2024-12-09 MED ORDER — ROSUVASTATIN CALCIUM 5 MG PO TABS: 5.0000 mg | ORAL_TABLET | Freq: Every day | ORAL | 3 refills | Status: AC

## 2024-12-09 NOTE — Telephone Encounter (Signed)
 Patient first time fill rosuvastatin  5mg  po daily from [facility] 90 tabs today.  Last labs  Next labs due Nov 2026 fasting  Patient verbalized understanding information and had no further questions at this time.  Latest Reference Range & Units 10/01/24 13:15  Sodium 134 - 144 mmol/L 134 - 144 mmol/L 141 141  Potassium 3.5 - 5.2 mmol/L 3.5 - 5.2 mmol/L 4.1 4.1  Chloride 96 - 106 mmol/L 96 - 106 mmol/L 106 106  Glucose 70 - 99 mg/dL 70 - 99 mg/dL 861 (H) 861 (H)  BUN 6 - 24 mg/dL 6 - 24 mg/dL 21 21  Creatinine 9.42 - 1.00 mg/dL 9.42 - 8.99 mg/dL 9.19 9.19  Calcium  8.7 - 10.2 mg/dL 8.7 - 89.7 mg/dL 9.1 9.1  BUN/Creatinine Ratio 9 - 23  9 - 23  26 (H) 26 (H)  eGFR >59 mL/min/1.73 >59 mL/min/1.73 85 85  Phosphorus 3.0 - 4.3 mg/dL 3.0 - 4.3 mg/dL 3.3 3.3  Alkaline Phosphatase 49 - 135 IU/L 49 - 135 IU/L 88 88  Albumin 3.8 - 4.9 g/dL 3.8 - 4.9 g/dL 4.0 4.0  Uric Acid 3.0 - 7.2 mg/dL 3.0 - 7.2 mg/dL 3.7 3.7  AST 0 - 40 IU/L 0 - 40 IU/L 21 21  ALT 0 - 32 IU/L 0 - 32 IU/L 23 23  Total Protein 6.0 - 8.5 g/dL 6.0 - 8.5 g/dL 6.6 6.6  Total Bilirubin 0.0 - 1.2 mg/dL 0.0 - 1.2 mg/dL 0.2 0.2  GGT 0 - 60 IU/L 0 - 60 IU/L 30 30  Estimated CHD Risk 0.0 - 1.0 times avg. 0.0 - 1.0 times avg.  < 0.5  < 0.5  LDH 119 - 226 IU/L 119 - 226 IU/L 203 203  Total CHOL/HDL Ratio 0.0 - 4.4 ratio 0.0 - 4.4 ratio 2.5 2.5  Cholesterol, Total 100 - 199 mg/dL 899 - 800 mg/dL 842 842  HDL Cholesterol >39 mg/dL >60 mg/dL 64 64  Triglycerides 0 - 149 mg/dL 0 - 850 mg/dL 61 61  VLDL Cholesterol Cal 5 - 40 mg/dL 5 - 40 mg/dL 12 12  LDL Chol Calc (NIH) 0 - 99 mg/dL 0 - 99 mg/dL 81 81  Iron 27 - 840 ug/dL 27 - 840 ug/dL 893 893  Globulin, Total 1.5 - 4.5 g/dL 1.5 - 4.5 g/dL 2.6 2.6  WBC 3.4 - 89.1 x10E3/uL 3.4 - 10.8 x10E3/uL 4.8 4.8  RBC 3.77 - 5.28 x10E6/uL 3.77 - 5.28 x10E6/uL 4.24 4.24  Hemoglobin 11.1 - 15.9 g/dL 88.8 - 84.0 g/dL 86.7 86.7  HCT 65.9 - 53.3 % 34.0  - 46.6 % 40.0 40.0  MCV 79 - 97 fL 79 - 97 fL 94 94  MCH 26.6 - 33.0 pg 26.6 - 33.0 pg 31.1 31.1  MCHC 31.5 - 35.7 g/dL 68.4 - 64.2 g/dL 66.9 66.9  RDW 88.2 - 84.5 % 11.7 - 15.4 % 12.8 12.8  Platelets 150 - 450 x10E3/uL 150 - 450 x10E3/uL 259 259  Neutrophils Not Estab. % Not Estab. % 61 61  Immature Granulocytes Not Estab. % Not Estab. % 0 0  NEUT# 1.4 - 7.0 x10E3/uL 1.4 - 7.0 x10E3/uL 2.9 2.9  Lymphs Abs 0.7 - 3.1 x10E3/uL 0.7 - 3.1 x10E3/uL 1.1 1.1  Monocytes Absolute 0.1 - 0.9 x10E3/uL 0.1 - 0.9 x10E3/uL 0.5 0.5  Basophils Absolute 0.0 - 0.2 x10E3/uL 0.0 - 0.2 x10E3/uL 0.1 0.1  Immature Grans (Abs) 0.0 - 0.1 x10E3/uL 0.0 - 0.1 x10E3/uL 0.0 0.0  Lymphs Not Estab. % Not  Estab. % 24 24  Monocytes Not Estab. % Not Estab. % 11 11  Basos Not Estab. % Not Estab. % 1 1  Eos Not Estab. % Not Estab. % 3 3  EOS (ABSOLUTE) 0.0 - 0.4 x10E3/uL 0.0 - 0.4 x10E3/uL 0.1 0.1  Hemoglobin A1C 4.8 - 5.6 % 4.8 - 5.6 % 5.8 (H) 5.8 (H)  TSH 0.450 - 4.500 uIU/mL 0.450 - 4.500 uIU/mL 0.506 0.506  Thyroxine (T4) 4.5 - 12.0 ug/dL 4.5 - 87.9 ug/dL 6.7 6.7  Free Thyroxine Index 1.2 - 4.9  1.2 - 4.9  1.9 1.9  T3 Uptake Ratio 24 - 39 % 24 - 39 % 28 28  (H): Data is abnormally high

## 2024-12-18 ENCOUNTER — Encounter: Payer: Self-pay | Admitting: Registered Nurse

## 2024-12-18 ENCOUNTER — Ambulatory Visit: Admitting: Registered Nurse

## 2024-12-18 VITALS — BP 115/68 | HR 85 | Temp 98.6°F | Resp 16 | Ht 61.5 in | Wt 200.0 lb

## 2024-12-18 DIAGNOSIS — G8929 Other chronic pain: Secondary | ICD-10-CM

## 2024-12-18 DIAGNOSIS — H6692 Otitis media, unspecified, left ear: Secondary | ICD-10-CM

## 2024-12-18 DIAGNOSIS — Z0000 Encounter for general adult medical examination without abnormal findings: Secondary | ICD-10-CM

## 2024-12-18 MED ORDER — AMOXICILLIN 875 MG PO TABS: 875.0000 mg | ORAL_TABLET | Freq: Two times a day (BID) | ORAL | Status: AC

## 2024-12-18 NOTE — Progress Notes (Signed)
 "  Established Patient Office Visit  Subjective   Patient ID: Stephanie Chandler, female    DOB: September 07, 1966  Age: 59 y.o. MRN: [id number]  Chief Complaint  Patient presents with   Pain    Left ear and knee    58y/o african american female established patient here for evaluation left ear pain denied using cotton applicators in ears, fever, chills, headache, ear discharge/bleeding.  Also having trouble with worsening knee pain icing prn; wearing neoprene knee sleeve left and taking celebrex  wondering if she can increase her dose; right knee also bothering her along with spine since colder weather moved into area.  Denied loss of bowel/bladder control, saddle paresthesias or arm/leg weakness, congestion, cough, wheezing.      Review of Systems  Constitutional:  Negative for chills, diaphoresis, fever and malaise/fatigue.  HENT:  Positive for ear pain. Negative for congestion, ear discharge, hearing loss, nosebleeds and sinus pain.   Eyes:  Negative for pain, discharge and redness.  Respiratory:  Negative for cough, hemoptysis, sputum production, shortness of breath, wheezing and stridor.   Cardiovascular:  Negative for chest pain.  Gastrointestinal:  Negative for diarrhea, nausea and vomiting.  Musculoskeletal:  Positive for back pain, joint pain, myalgias and neck pain. Negative for falls.  Skin:  Negative for itching and rash.  Neurological:  Negative for dizziness, speech change, focal weakness, seizures, loss of consciousness, weakness and headaches.      Objective:     BP 115/68 (BP Location: Right Arm, Patient Position: Sitting, Cuff Size: Normal)   Pulse 85   Temp 98.6 F (37 C) (Tympanic)   Resp 16   Ht 5' 1.5 (1.562 m)   Wt 200 lb (90.7 kg)   SpO2 100%   BMI 37.18 kg/m    Physical Exam Vitals and nursing note reviewed.  Constitutional:      General: She is awake. She is not in acute distress.    Appearance: Normal appearance. She is well-developed and well-groomed.  She is obese. She is not ill-appearing, toxic-appearing or diaphoretic.  HENT:     Head: Normocephalic and atraumatic.     Jaw: There is normal jaw occlusion.     Salivary Glands: Right salivary gland is not diffusely enlarged or tender. Left salivary gland is not diffusely enlarged or tender.     Right Ear: Hearing and external ear normal. No decreased hearing noted. No laceration, drainage or swelling. A middle ear effusion is present. There is no impacted cerumen. No foreign body. No mastoid tenderness. No PE tube. No hemotympanum. Tympanic membrane is not injected, scarred or perforated.     Left Ear: Hearing and external ear normal. No decreased hearing noted. No laceration or drainage. A middle ear effusion is present. There is no impacted cerumen. No foreign body. No mastoid tenderness. No PE tube. No hemotympanum. Tympanic membrane is erythematous. Tympanic membrane is not injected, scarred or perforated.     Ears:     Comments: Left auditory canal proximal to TM with erythema and TM erythematous; bilateral TMs intact air fluid level clear; no debris noted bilateral auditory canals; discerned normal conversational voice without difficulty    Nose: Nose normal. No signs of injury, laceration, nasal tenderness, mucosal edema, congestion or rhinorrhea.     Right Nostril: No epistaxis.     Left Nostril: No epistaxis.     Right Turbinates: Not enlarged, swollen or pale.     Left Turbinates: Not enlarged, swollen or pale.     Right  Sinus: No maxillary sinus tenderness or frontal sinus tenderness.     Left Sinus: No maxillary sinus tenderness or frontal sinus tenderness.     Mouth/Throat:     Lips: Pink. No lesions.     Mouth: Mucous membranes are moist. No oral lesions or angioedema.     Dentition: No gum lesions.     Tongue: No lesions. Tongue does not deviate from midline.     Palate: No mass and lesions.     Pharynx: Oropharynx is clear. Uvula midline. No pharyngeal swelling, oropharyngeal  exudate, posterior oropharyngeal erythema, uvula swelling or postnasal drip.     Tonsils: No tonsillar exudate.  Eyes:     General: Lids are normal. Vision grossly intact. Gaze aligned appropriately. Allergic shiner present. No scleral icterus.       Right eye: No discharge.        Left eye: No discharge.     Extraocular Movements: Extraocular movements intact.     Conjunctiva/sclera: Conjunctivae normal.     Pupils: Pupils are equal, round, and reactive to light.  Neck:     Trachea: Trachea normal.  Cardiovascular:     Rate and Rhythm: Normal rate and regular rhythm.     Pulses: Normal pulses.          Radial pulses are 2+ on the right side and 2+ on the left side.     Heart sounds: Normal heart sounds, S1 normal and S2 normal.  Pulmonary:     Effort: Pulmonary effort is normal. No respiratory distress.     Breath sounds: Normal breath sounds and air entry. No stridor or transmitted upper airway sounds. No decreased breath sounds, wheezing, rhonchi or rales.     Comments: Spoke full sentences without difficulty; no cough observed in exam room; BBS CTA Abdominal:     General: Abdomen is flat.  Musculoskeletal:        General: No signs of injury. Normal range of motion.     Right hand: Normal strength. Normal capillary refill.     Left hand: Normal strength. Normal capillary refill.     Cervical back: Normal range of motion and neck supple. No swelling, edema, deformity, erythema, signs of trauma, lacerations, rigidity, spasms, torticollis, tenderness or crepitus. No pain with movement. Normal range of motion.     Thoracic back: No swelling, edema, deformity, signs of trauma, lacerations, spasms or tenderness. Normal range of motion.     Right lower leg: No swelling or lacerations. No edema.     Left lower leg: No swelling or lacerations. No edema.     Right ankle: No swelling.     Left ankle: No swelling.     Comments: Neoprene sleeve left knee; wearing skinny jeans today; arom  bilateral knees at baseline along with c/t/l-spine  Lymphadenopathy:     Head:     Right side of head: No submental, submandibular, tonsillar, preauricular, posterior auricular or occipital adenopathy.     Left side of head: No submental, submandibular, tonsillar, preauricular, posterior auricular or occipital adenopathy.     Cervical: No cervical adenopathy.     Right cervical: No superficial, deep or posterior cervical adenopathy.    Left cervical: No superficial, deep or posterior cervical adenopathy.  Skin:    General: Skin is warm and dry.     Capillary Refill: Capillary refill takes less than 2 seconds.     Coloration: Skin is not ashen, cyanotic, jaundiced, mottled, pale or sallow.     Findings:  No abrasion, abscess, acne, bruising, burn, ecchymosis, erythema, signs of injury, laceration, lesion, petechiae, rash or wound.     Comments: Anterior face/neck and hands visually inspected  Neurological:     General: No focal deficit present.     Mental Status: She is alert and oriented to person, place, and time. Mental status is at baseline.     GCS: GCS eye subscore is 4. GCS verbal subscore is 5. GCS motor subscore is 6.     Cranial Nerves: No cranial nerve deficit, dysarthria or facial asymmetry.     Sensory: Sensation is intact.     Motor: Motor function is intact. No weakness, tremor, abnormal muscle tone or seizure activity.     Coordination: Coordination is intact. Coordination normal.     Gait: Gait is intact. Gait normal.     Comments: In/out of chair and on/of exam table without difficulty; gait sure and steady in clinic; bilateral hand grasp equal 5/5  Psychiatric:        Attention and Perception: Attention and perception normal.        Mood and Affect: Mood and affect normal.        Speech: Speech normal.        Behavior: Behavior normal. Behavior is cooperative.        Thought Content: Thought content normal.        Cognition and Memory: Cognition and memory normal.         Judgment: Judgment normal.     Latest Reference Range & Units 10/01/24 13:15  Sodium 134 - 144 mmol/L 134 - 144 mmol/L 141 141  Potassium 3.5 - 5.2 mmol/L 3.5 - 5.2 mmol/L 4.1 4.1  Chloride 96 - 106 mmol/L 96 - 106 mmol/L 106 106  Glucose 70 - 99 mg/dL 70 - 99 mg/dL 861 (H) 861 (H)  BUN 6 - 24 mg/dL 6 - 24 mg/dL 21 21  Creatinine 9.42 - 1.00 mg/dL 9.42 - 8.99 mg/dL 9.19 9.19  Calcium  8.7 - 10.2 mg/dL 8.7 - 89.7 mg/dL 9.1 9.1  BUN/Creatinine Ratio 9 - 23  9 - 23  26 (H) 26 (H)  eGFR >59 mL/min/1.73 >59 mL/min/1.73 85 85  Phosphorus 3.0 - 4.3 mg/dL 3.0 - 4.3 mg/dL 3.3 3.3  Alkaline Phosphatase 49 - 135 IU/L 49 - 135 IU/L 88 88  Albumin 3.8 - 4.9 g/dL 3.8 - 4.9 g/dL 4.0 4.0  Uric Acid 3.0 - 7.2 mg/dL 3.0 - 7.2 mg/dL 3.7 3.7  AST 0 - 40 IU/L 0 - 40 IU/L 21 21  ALT 0 - 32 IU/L 0 - 32 IU/L 23 23  Total Protein 6.0 - 8.5 g/dL 6.0 - 8.5 g/dL 6.6 6.6  Total Bilirubin 0.0 - 1.2 mg/dL 0.0 - 1.2 mg/dL 0.2 0.2  GGT 0 - 60 IU/L 0 - 60 IU/L 30 30  Estimated CHD Risk 0.0 - 1.0 times avg. 0.0 - 1.0 times avg.  < 0.5  < 0.5  LDH 119 - 226 IU/L 119 - 226 IU/L 203 203  Total CHOL/HDL Ratio 0.0 - 4.4 ratio 0.0 - 4.4 ratio 2.5 2.5  Cholesterol, Total 100 - 199 mg/dL 899 - 800 mg/dL 842 842  HDL Cholesterol >39 mg/dL >60 mg/dL 64 64  Triglycerides 0 - 149 mg/dL 0 - 850 mg/dL 61 61  VLDL Cholesterol Cal 5 - 40 mg/dL 5 - 40 mg/dL 12 12  LDL Chol Calc (NIH) 0 - 99 mg/dL 0 - 99 mg/dL 81 81  Iron 27 -  159 ug/dL 27 - 840 ug/dL 893 893  Globulin, Total 1.5 - 4.5 g/dL 1.5 - 4.5 g/dL 2.6 2.6  WBC 3.4 - 89.1 x10E3/uL 3.4 - 10.8 x10E3/uL 4.8 4.8  RBC 3.77 - 5.28 x10E6/uL 3.77 - 5.28 x10E6/uL 4.24 4.24  Hemoglobin 11.1 - 15.9 g/dL 88.8 - 84.0 g/dL 86.7 86.7  HCT 65.9 - 53.3 % 34.0 - 46.6 % 40.0 40.0  MCV 79 - 97 fL 79 - 97 fL 94 94  MCH 26.6 - 33.0 pg 26.6 - 33.0 pg 31.1 31.1  MCHC 31.5 - 35.7 g/dL 68.4 - 64.2 g/dL 66.9 66.9  RDW 88.2 - 84.5  % 11.7 - 15.4 % 12.8 12.8  Platelets 150 - 450 x10E3/uL 150 - 450 x10E3/uL 259 259  Neutrophils Not Estab. % Not Estab. % 61 61  Immature Granulocytes Not Estab. % Not Estab. % 0 0  NEUT# 1.4 - 7.0 x10E3/uL 1.4 - 7.0 x10E3/uL 2.9 2.9  Lymphs Abs 0.7 - 3.1 x10E3/uL 0.7 - 3.1 x10E3/uL 1.1 1.1  Monocytes Absolute 0.1 - 0.9 x10E3/uL 0.1 - 0.9 x10E3/uL 0.5 0.5  Basophils Absolute 0.0 - 0.2 x10E3/uL 0.0 - 0.2 x10E3/uL 0.1 0.1  Immature Grans (Abs) 0.0 - 0.1 x10E3/uL 0.0 - 0.1 x10E3/uL 0.0 0.0  Lymphs Not Estab. % Not Estab. % 24 24  Monocytes Not Estab. % Not Estab. % 11 11  Basos Not Estab. % Not Estab. % 1 1  Eos Not Estab. % Not Estab. % 3 3  EOS (ABSOLUTE) 0.0 - 0.4 x10E3/uL 0.0 - 0.4 x10E3/uL 0.1 0.1  Hemoglobin A1C 4.8 - 5.6 % 4.8 - 5.6 % 5.8 (H) 5.8 (H)  TSH 0.450 - 4.500 uIU/mL 0.450 - 4.500 uIU/mL 0.506 0.506  Thyroxine (T4) 4.5 - 12.0 ug/dL 4.5 - 87.9 ug/dL 6.7 6.7  Free Thyroxine Index 1.2 - 4.9  1.2 - 4.9  1.9 1.9  T3 Uptake Ratio 24 - 39 % 24 - 39 % 28 28  (H): Data is abnormally high   No results found for any visits on 12/18/24.    The 10-year ASCVD risk score (Arnett DK, et al., 2019) is: 7.9%    Assessment & Plan:   Problem List Items Addressed This Visit   None Visit Diagnoses       Acute left otitis media    -  Primary     Preventative health care         Chronic knee pain after total replacement of left knee joint          Supportive treatment. Amoxicillin  875mg  po BID x 10 days #20 RF0 dispensed from [facility] to patient  Tylenol  1000mg  po QID prn pain/fever.   No evidence of invasive bacterial infection, non toxic and well hydrated. I do not see where any further testing or imaging is necessary at this time.   I will suggest supportive care, rest, good hygiene and encourage the patient to take adequate fluids.  The patient is to return to clinic or EMERGENCY ROOM if symptoms worsen or change significantly e.g. ear pain, fever,  purulent discharge from ears or bleeding.  Exitcare handout on otitis media   Patient verbalized agreement and understanding of treatment plan.     Discussed with patient max dosing celecoxib  per epocrates is 200mg  po q12h.  Consider trial epsom salt warm bath soak, heating pad, gentle arom.  Continue wear of neoprene knee sleeves knees during cold weather to keep joints warmer.  Thermacare patch topical prn.  Tylenol   1000mg  po q6h prn pain in addition to celebrex . Consider swim/hot tub to assist with weight loss or seat weights.  As patient has reported recent weight gain.   Patient verbalized understanding information/instructions and had no further questions at this time.  Patient had labs Nov 2025  LDL less than 130 and Hgba1c less than 7 met requirements for insurance discount starting 27 Aug 2025.  Patient signed Be Well 2027 ROI, tobacco attestation negative in previous 12 months.  NP completed provider portion of forms weight today 200lbs BP 115/68 right arm.  BMI 37.17 last year Be Well 189lbs BMI 35.  Encouraged activity and healthy dietary choices for weight loss over the next year 10 lbs.  HR team notified patient met requirements for insurance discount.  Discussed clinic hours M-Th 8a-5p RN Karene in clinic and NP Ellouise in clinic Tues 09-12 and Thur 11a-2p.  Communicable disease policy e.g. stay home if temperature greater than 100.61F, vomiting/diarrhea within 24 hours of shift, home testing positive covid/flu within 24 hours of shift.  Clinic notification methods my chart, email [email or web address] or call [phone] M-Th.  Discussed new light information system outside clinic door.  Red clinic closed  Yellow assisting another employee please wait in hallway.  Green clinic open may walk in.  mammogram bus here Feb 2026-patient stated completed mammogram a few months ago and does not need appt.  Colonoscopy last year.  Discussed my chart can be used to message clinic, schedule appt and review test  results from this clinic also.  Patient verbalized understanding information/instructions and had no further questions at this time. Return if symptoms worsen or fail to improve.    Ellouise DELENA Hope, NP  "

## 2024-12-18 NOTE — Patient Instructions (Signed)
 Arthritis: What to Know Arthritis is a term for joint pain. It can also mean joint disease. There are more than 100 types of arthritis. What are the causes? Wear and tear of a joint. This is the most common cause. Gout. This is when you get too much of a chemical called uric acid in your blood. It can build up and cause pain. Pain and swelling in a joint. Infection of a joint. Injuries in the joint. A reaction to medicines. In some cases, the cause may not be known. What are the signs or symptoms? Pain in a joint when moving. This is the main symptom. Redness, swelling, or stiffness at a joint. Warmth coming from a joint. A fever. A feeling of being sick. How is this diagnosed? You may be diagnosed based on an exam and tests. These tests may include: Blood tests. Pee tests. Imaging tests, such as: X-rays. An MRI. A CT scan. A biopsy. This is when fluid is removed from a joint for testing. How is this treated? To treat arthritis, you may need to: Treat the cause, if you know what it is. Rest. Raise the joint. Put cold or hot packs on the joint. Take medicines to: Treat symptoms. Reduce pain and swelling. Have shots of a medicine, such as cortisone, put into the joint. You may also be told to make changes in your life. You may be told to: Do exercises. Lose weight. Follow these instructions at home: Medicines Take your medicines only as told. Do not take aspirin  if your health care provider says that you may have gout. Activity Rest your joint as told. Avoid doing things that make your pain worse. Exercise as told. You may need to do range-of-motion exercises or try: Swimming. Water  aerobics. Biking. Walking. Managing pain, stiffness, and swelling     Use ice or an ice pack as told. Place a towel between your skin and the ice. Leave the ice on for 20 minutes, 2-3 times a day. Use heat as told. Use the heat source that your provider recommends, such as a moist  heat pack or a heating pad. Do this as often as told. Place a towel between your skin and the heat source. Leave the heat on for 20-30 minutes. If your skin turns red, take off the ice or heat right away to prevent skin damage. The risk of damage is higher if you can't feel pain, heat, or cold. If your joint is swollen, raise it above the level of your heart as told. If your joint feels stiff in the morning, try taking a warm shower. General instructions Stay at a healthy weight. Lose weight as told. Do not smoke, vape, or use nicotine  or tobacco. Where to find more information [employer]: niams.[email or web address].myers.net/ Contact a health care provider if: The pain gets worse. You have a fever. Get help right away if: You have very bad pain in your joint. You have swelling in your joint. Your joint is red. Many joints get painful and swollen. You have very bad back pain. Your leg is very weak. This information is not intended to replace advice given to you by your health care provider. Make sure you discuss any questions you have with your health care provider. Document Revised: 12/28/2023 Document Reviewed: 12/28/2023 Elsevier Patient Education  2025 [employer]Otitis Media, Adult  Otitis media occurs when there is inflammation and fluid in the middle ear with signs and symptoms of an acute infection. The middle ear is  a part of the ear that contains bones for hearing as well as air that helps send sounds to the brain. When infected fluid builds up in this space, it causes pressure and can lead to an ear infection. The eustachian tube connects the middle ear to the back of the nose (nasopharynx) and normally allows air into the middle ear. If the eustachian tube becomes blocked, fluid can build up and become infected. What are the causes? This condition is caused by a blockage in the eustachian tube. This can be caused by mucus or by swelling of the tube. Problems that can cause a  blockage include: A cold or other upper respiratory infection. Allergies. An irritant, such as tobacco smoke. Enlarged adenoids. The adenoids are areas of soft tissue located high in the back of the throat, behind the nose and the roof of the mouth. They are part of the body's defense system (immune system). A mass in the nasopharynx. Damage to the ear caused by pressure changes (barotrauma). What increases the risk? You are more likely to develop this condition if you: Smoke or are exposed to tobacco smoke. Have an opening in the roof of your mouth (cleft palate). Have gastroesophageal reflux. Have an immune system disorder. What are the signs or symptoms? Symptoms of this condition include: Ear pain. Fever. Decreased hearing. Tiredness (lethargy). Fluid leaking from the ear, if the eardrum is ruptured or has burst. Ringing in the ear. How is this diagnosed?  This condition is diagnosed with a physical exam. During the exam, your health care provider will use an instrument called an otoscope to look in your ear and check for redness, swelling, and fluid. He or she will also ask about your symptoms. Your health care provider may also order tests, such as: A pneumatic otoscopy. This is a test to check the movement of the eardrum. It is done by squeezing a small amount of air into the ear. A tympanogram. This is a test that shows how well the eardrum moves in response to air pressure in the ear canal. It provides a graph for your health care provider to review. How is this treated? This condition can go away on its own within 3-5 days. But if the condition is caused by a bacterial infection and does not go away on its own, or if it keeps coming back, your health care provider may: Prescribe antibiotic medicine to treat the infection. Prescribe or recommend medicines to control pain. Follow these instructions at home: Take over-the-counter and prescription medicines only as told by your  health care provider. If you were prescribed an antibiotic medicine, take it as told by your health care provider. Do not stop taking the antibiotic even if you start to feel better. Keep all follow-up visits. This is important. Contact a health care provider if: You have bleeding from your nose. There is a lump on your neck. You are not feeling better in 5 days. You feel worse instead of better. Get help right away if: You have severe pain that is not controlled with medicine. You have swelling, redness, or pain around your ear. You have stiffness in your neck. A part of your face is not moving (paralyzed). The bone behind your ear (mastoid bone) is tender when you touch it. You develop a severe headache. Summary Otitis media is redness, soreness, and swelling of the middle ear, usually resulting in pain and decreased hearing. This condition can go away on its own within 3-5 days. If  the problem does not go away in 3-5 days, your health care provider may give you medicines to treat the infection. If you were prescribed an antibiotic medicine, take it as told by your health care provider. Follow all instructions that were given to you by your health care provider. This information is not intended to replace advice given to you by your health care provider. Make sure you discuss any questions you have with your health care provider. Document Revised: 02/21/2021 Document Reviewed: 02/21/2021 Elsevier Patient Education  2024 [employer].

## 2024-12-26 ENCOUNTER — Other Ambulatory Visit: Payer: Self-pay

## 2024-12-26 MED ORDER — OZEMPIC (0.25 OR 0.5 MG/DOSE) 2 MG/3ML ~~LOC~~ SOPN
PEN_INJECTOR | SUBCUTANEOUS | 0 refills | Status: AC
  Filled 2024-12-26: qty 3, 28d supply, fill #0

## 2024-12-29 ENCOUNTER — Other Ambulatory Visit: Payer: Self-pay

## 2025-01-02 ENCOUNTER — Other Ambulatory Visit: Payer: Self-pay

## 2025-01-09 ENCOUNTER — Ambulatory Visit (HOSPITAL_BASED_OUTPATIENT_CLINIC_OR_DEPARTMENT_OTHER): Admitting: Pulmonary Disease
